# Patient Record
Sex: Male | Born: 1965
Health system: Southern US, Community
[De-identification: ages and names within clinical notes are randomized; demographics above are authoritative.]

## PROBLEM LIST (undated history)

## (undated) DIAGNOSIS — I776 Arteritis, unspecified: Secondary | ICD-10-CM

## (undated) DIAGNOSIS — R569 Unspecified convulsions: Secondary | ICD-10-CM

## (undated) DIAGNOSIS — Z79899 Other long term (current) drug therapy: Secondary | ICD-10-CM

## (undated) DIAGNOSIS — C801 Malignant (primary) neoplasm, unspecified: Secondary | ICD-10-CM

## (undated) DIAGNOSIS — I350 Nonrheumatic aortic (valve) stenosis: Secondary | ICD-10-CM

## (undated) DIAGNOSIS — C4490 Unspecified malignant neoplasm of skin, unspecified: Secondary | ICD-10-CM

## (undated) DIAGNOSIS — E782 Mixed hyperlipidemia: Secondary | ICD-10-CM

## (undated) DIAGNOSIS — I8390 Asymptomatic varicose veins of unspecified lower extremity: Secondary | ICD-10-CM

## (undated) DIAGNOSIS — S0990XA Unspecified injury of head, initial encounter: Secondary | ICD-10-CM

## (undated) DIAGNOSIS — I10 Essential (primary) hypertension: Secondary | ICD-10-CM

## (undated) DIAGNOSIS — R011 Cardiac murmur, unspecified: Secondary | ICD-10-CM

## (undated) HISTORY — DX: Other long term (current) drug therapy: Z79.899

## (undated) HISTORY — DX: Unspecified convulsions: R56.9

## (undated) HISTORY — DX: Unspecified malignant neoplasm of skin, unspecified: C44.90

## (undated) HISTORY — DX: Unspecified injury of head, initial encounter: S09.90XA

## (undated) HISTORY — DX: Nonrheumatic aortic (valve) stenosis: I35.0

## (undated) HISTORY — DX: Mixed hyperlipidemia: E78.2

## (undated) HISTORY — DX: Arteritis, unspecified: I77.6

## (undated) HISTORY — DX: Malignant (primary) neoplasm, unspecified: C80.1

## (undated) HISTORY — PX: OTHER SURGICAL HISTORY: SHX169

## (undated) HISTORY — DX: Asymptomatic varicose veins of unspecified lower extremity: I83.90

## (undated) HISTORY — PX: TOE INTERNAL FIXATION W/ COMPRESSION SCREW: SHX2532

---
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
## 2012-07-31 ENCOUNTER — Telehealth: Payer: Self-pay

## 2012-07-31 NOTE — Telephone Encounter (Signed)
Patient called, left message.  He was requesting a refill on his Depakote through the Patient Assistance Program.  I called them at [phone].  They said his enrollment has expired, and he needs to complete a new application and provide current proof of income.  Once everything is returned to them, they will review app and decide if he qualifies for additional assistance.  I called the patient back, got no answer.  Left message.  I have placed application at the front desk for patient to complete, sign and return along with proof of income.

## 2012-07-31 NOTE — Telephone Encounter (Signed)
Patient returned application, but did not provide proof of income.  I called the patient.  He was not available.  Left message with mom.  She will let him know we called and we need additional info.  Without this info, they will deny the application.

## 2012-11-21 ENCOUNTER — Telehealth: Payer: Self-pay

## 2012-11-21 NOTE — Telephone Encounter (Signed)
I called [facility] at [phone]. Placed Reorder for Depakote. Meds will arrive in 7-10 business days.   

## 2012-11-27 ENCOUNTER — Telehealth: Payer: Self-pay

## 2012-11-27 NOTE — Telephone Encounter (Signed)
Call pt about his patient assist medication ( depakote), spoke with mother, Jamesetta So. Advice her that it would be available for pick up at the front desk. Mother verbalized understanding and will have pt pick it up.

## 2012-12-18 ENCOUNTER — Other Ambulatory Visit: Payer: Self-pay | Admitting: Neurology

## 2013-02-06 ENCOUNTER — Other Ambulatory Visit: Payer: Self-pay | Admitting: Neurology

## 2013-02-26 ENCOUNTER — Telehealth: Payer: Self-pay

## 2013-02-26 NOTE — Telephone Encounter (Signed)
I called [facility] at [phone]. Placed Reorder for Depakote. Meds will arrive in 7-10 business days.

## 2013-03-06 ENCOUNTER — Telehealth: Payer: Self-pay | Admitting: Neurology

## 2013-03-06 NOTE — Telephone Encounter (Signed)
Called patient and spoke with patient's mother Jerry [name] concerning patient's medication of depakote ER 500 mg being ready to be picked up at the front desk and if the patient has any other problems, questions or concerns to call the office. Patient's mother verbalized understanding.

## 2013-06-09 ENCOUNTER — Telehealth: Payer: Self-pay | Admitting: *Deleted

## 2013-06-09 NOTE — Telephone Encounter (Signed)
I called [facility] at [phone]. Placed Reorder for Depakote. Meds will arrive in 7-10 business days.

## 2013-06-09 NOTE — Telephone Encounter (Signed)
Patient calling requesting refill of Depakote 500 mg ER.  Thanks

## 2013-06-12 NOTE — Telephone Encounter (Signed)
Patient was informed that his PA meds came in on today and are at the front desk to be picked up.

## 2013-09-28 ENCOUNTER — Telehealth: Payer: Self-pay | Admitting: *Deleted

## 2013-09-28 NOTE — Telephone Encounter (Signed)
Patient was not available when I called him back previously.  I called again, left message advising he will need to reapply for the program and provide new proof of income.  (Phone number is [phone])

## 2013-09-28 NOTE — Telephone Encounter (Signed)
This patient needs to schedule appt.  I called back, left message.

## 2013-09-28 NOTE — Telephone Encounter (Signed)
Patient has scheduled an appt.  I called [facility] at [phone]. The patient's enrollment has expired, and he must reapply and provide proof of income.  I called the patient back

## 2013-10-05 ENCOUNTER — Telehealth: Payer: Self-pay | Admitting: Nurse Practitioner

## 2013-10-05 NOTE — Telephone Encounter (Signed)
Patient is requesting Depakote samples. He has enough to get him by until Wednesday. Please call to advise.

## 2013-10-05 NOTE — Telephone Encounter (Signed)
I called and spoke with the patient who said he dropped off his PAP paperwork today.  Once provider signs our portion, we will forward this to the program.  Asked me if I could have PAP send him 1 year worth of meds all at once if he is approved.  Advised they have to dispense meds 90 days at a time per program regulations.  He has some medication remaining at this time.    He has an appt scheduled in Nov with CM, but earlier today requested to see Dr Krista Blue this visit per previous portion of this message.

## 2013-10-05 NOTE — Telephone Encounter (Signed)
Patient requesting to be seen sooner than his November scheduled apt for medication management followup. He has not been seen in 2 years. He also requests to see Dr. Krista Blue as he states that he has never seen her and her name is on his medication bottles. Best number to reach is [phone] and it is okay to leave a detailed message at that number.

## 2013-10-05 NOTE — Telephone Encounter (Signed)
Patient last seen by Cm in 2013, and requesting to see Dr. Krista Blue. Please advise and schedule.

## 2013-10-06 ENCOUNTER — Telehealth: Payer: Self-pay | Admitting: Neurology

## 2013-10-06 NOTE — Telephone Encounter (Signed)
Patient is on Dr.Yan schedule  Because she had a cancellation . Patient will see [name] next visit .

## 2013-10-06 NOTE — Telephone Encounter (Signed)
Patient calling again to state that he only has enough Depakote for tomorrow morning, patient is very frustrated, please call and advise.

## 2013-10-06 NOTE — Telephone Encounter (Signed)
I called and spoke with the patient.  He did not submit proof of income with his PAP enrollment form.  Advised him the program will not proceed with his application without this info.  He verbalized understanding and says he will bring it in tomorrow.  Patient does have an appt scheduled in Nov.  He is not able to afford Rx via regular pharmacy while PAP is pending.  Patient is requesting a small supply of samples to last until PAP determination is made.   We will send the proof of income to them once he turns it in.  Patient says he was upset because he thought he could go to the hospital and ask them to give him Depakote at no charge, as he is sure they have plenty.  Explained they would not be able to dispense meds unless he was admitted.

## 2013-10-06 NOTE — Telephone Encounter (Signed)
Ok, medication left at the front desk.

## 2013-10-07 ENCOUNTER — Ambulatory Visit (INDEPENDENT_AMBULATORY_CARE_PROVIDER_SITE_OTHER): Payer: Self-pay

## 2013-10-07 ENCOUNTER — Ambulatory Visit (INDEPENDENT_AMBULATORY_CARE_PROVIDER_SITE_OTHER): Payer: Self-pay | Admitting: Neurology

## 2013-10-07 ENCOUNTER — Encounter: Payer: Self-pay | Admitting: Neurology

## 2013-10-07 ENCOUNTER — Telehealth: Payer: Self-pay

## 2013-10-07 VITALS — BP 127/80 | HR 65 | Ht 71.0 in | Wt 204.0 lb

## 2013-10-07 DIAGNOSIS — S0990XA Unspecified injury of head, initial encounter: Secondary | ICD-10-CM | POA: Insufficient documentation

## 2013-10-07 DIAGNOSIS — Z79899 Other long term (current) drug therapy: Secondary | ICD-10-CM

## 2013-10-07 DIAGNOSIS — Z5181 Encounter for therapeutic drug level monitoring: Secondary | ICD-10-CM | POA: Insufficient documentation

## 2013-10-07 DIAGNOSIS — G40909 Epilepsy, unspecified, not intractable, without status epilepticus: Secondary | ICD-10-CM | POA: Insufficient documentation

## 2013-10-07 DIAGNOSIS — R569 Unspecified convulsions: Secondary | ICD-10-CM

## 2013-10-07 LAB — COMPREHENSIVE METABOLIC PANEL
ALT: 38 IU/L (ref 0–44)
AST: 19 IU/L (ref 0–40)
Albumin/Globulin Ratio: 1.8 (ref 1.1–2.5)
Albumin: 4.6 g/dL (ref 3.5–5.5)
Alkaline Phosphatase: 87 IU/L (ref 39–117)
BUN/Creatinine Ratio: 19 (ref 9–20)
BUN: 18 mg/dL (ref 6–24)
CALCIUM: 9.2 mg/dL (ref 8.7–10.2)
CHLORIDE: 100 mmol/L (ref 96–108)
CO2: 27 mmol/L (ref 18–29)
Creatinine, Ser: 0.95 mg/dL (ref 0.76–1.27)
GFR calc Af Amer: 111 mL/min/{1.73_m2} (ref >59)
GFR calc non Af Amer: 96 mL/min/{1.73_m2} (ref >59)
GLUCOSE: 77 mg/dL (ref 65–99)
Globulin, Total: 2.5 g/dL (ref 1.5–4.5)
Potassium: 4.3 mmol/L (ref 3.5–5.2)
Sodium: 139 mmol/L (ref 134–144)
TOTAL PROTEIN: 7.1 g/dL (ref 6.0–8.5)
Total Bilirubin: 0.2 mg/dL (ref 0.0–1.2)

## 2013-10-07 LAB — CBC
HCT: 45.6 % (ref 37.5–51.0)
HEMOGLOBIN: 16.2 g/dL (ref 12.6–17.7)
MCH: 33.2 pg — ABNORMAL HIGH (ref 26.6–33.0)
MCHC: 35.5 g/dL (ref 31.5–35.7)
MCV: 93 fL (ref 79–97)
PLATELETS: 257 10*3/uL (ref 150–379)
RBC: 4.88 x10E6/uL (ref 4.14–5.80)
RDW: 13.7 % (ref 12.3–15.4)
WBC: 11.6 10*3/uL — AB (ref 3.4–10.8)

## 2013-10-07 LAB — VALPROIC ACID LEVEL: Valproic Acid Lvl: 44 ug/mL — ABNORMAL LOW (ref 50–100)

## 2013-10-07 LAB — PHENYTOIN LEVEL, TOTAL: Phenytoin Lvl: 19 ug/mL (ref 10.0–20.0)

## 2013-10-07 MED ORDER — DIVALPROEX SODIUM ER 500 MG PO TB24: ORAL_TABLET | ORAL | Status: DC

## 2013-10-07 MED ORDER — PHENYTOIN SODIUM EXTENDED 100 MG PO CAPS: ORAL_CAPSULE | ORAL | Status: DC

## 2013-10-07 NOTE — Progress Notes (Signed)
PATIENT: Jerry [name] DOB: 25-May-1965  HISTORICAL  Jerry [name] is a 48 year old right-handed Caucasian male, alone at visit, to followup his seizure disorder  Last clinical visit was our office was in November 2013 with Jerry [name], he had a history of seizure disorder since subdural hematoma after a traumatic brain injury in 1984, previous abnormal EEG, he also had some anxiety disorder, he is no longer working.  He lives with his mother, driving, last clinical seizure was September 2011,  He has been on long-term combination therapy of Depakote ER 500 mg 2 tablets twice a day, high dose of Dilantin 100 mg 3 tablets twice a day, plus chewable 50 mg once a day  He is getting assistance program for Depakote ER, coordinated through our office  He is overall doing well, tolerating medications, no recurrent seizure  REVIEW OF SYSTEMS: Full 14 system review of systems performed and notable only for as above  ALLERGIES: Allergies not on file  HOME MEDICATIONS: Current Outpatient Prescriptions on File Prior to Visit  Medication Sig Dispense Refill  . phenytoin (DILANTIN) 100 MG ER capsule TAKE 3 CAPSULE TWICE DAILY  180 capsule  0  . phenytoin (DILANTIN) 50 MG tablet TAKE 1 TABLET BY MOUTH EVERY DAY WITH 100MG  TABS  30 tablet  11   No current facility-administered medications on file prior to visit.    PAST MEDICAL HISTORY: Past Medical History  Diagnosis Date  . Seizure   . Head injury   . Encounter for long-term (current) use of other medications     PAST SURGICAL HISTORY: Past Surgical History  Procedure Laterality Date  . Toe internal fixation w/ compression screw      FAMILY HISTORY: Family History  Problem Relation Age of Onset  . Diabetes Father     SOCIAL HISTORY:  History   Social History  . Marital Status: Married    Spouse Name: N/A    Number of Children: 0  . Years of Education: 12   Occupational History  .      Not working    Social History Main Topics  . Smoking status: Current Every Day Smoker  . Smokeless tobacco: Never Used  . Alcohol Use: No  . Drug Use: No  . Sexual Activity: Not on file   Other Topics Concern  . Not on file   Social History Narrative   Patient is currently not [place] school education some college. Patient  Is single and lives with his mother.    Right handed.   Caffeine one cup daily.     PHYSICAL EXAM   Filed Vitals:   10/07/13 0831  BP: 127/80  Pulse: 65  Height: 5\' 11"  (1.803 m)  Weight: 204 lb (92.534 kg)    Not recorded    Body mass index is 28.46 kg/(m^2).   Generalized: In no acute distress  Neck: Supple, no carotid bruits   Cardiac: Regular rate rhythm  Pulmonary: Clear to auscultation bilaterally  Musculoskeletal: No deformity  Neurological examination  Mentation: Alert oriented to time, place, history taking, and causual conversation  Cranial nerve II-XII: Pupils were equal round reactive to light. Extraocular movements were full.  Visual field were full on confrontational test. Bilateral fundi were sharp.  Facial sensation and strength were normal. Hearing was intact to finger rubbing bilaterally. Uvula tongue midline.  Head turning and shoulder shrug and were normal and symmetric.Tongue protrusion into cheek strength was normal.  Motor: Normal tone, bulk and strength.  Sensory: Intact to fine touch, pinprick, preserved vibratory sensation, and proprioception at toes.  Coordination: Normal finger to nose, heel-to-shin bilaterally there was no truncal ataxia  Gait: Rising up from seated position without assistance, normal stance, without trunk ataxia, moderate stride, good arm swing, smooth turning, able to perform tiptoe, and heel walking without difficulty.   Romberg signs: Negative  Deep tendon reflexes: Brachioradialis 2/2, biceps 2/2, triceps 2/2, patellar 2/2, Achilles 2/2, plantar responses were flexor bilaterally.   DIAGNOSTIC  DATA (LABS, IMAGING, TESTING) - I reviewed patient records, labs, notes, testing and imaging myself where available.   ASSESSMENT AND PLAN  Jerry [name] is a 48 y.o. male with previous history of subdural hematoma, bilateral frontal craniotomy, complex partial seizure, abnormal EEG in the past, last seizure was in September 2011, he has been doing very well, driving, he is on large combination dose of Dilantin, 650 mg a day, Depakote ER 500 mg 2 tablets twice a day   1, he has been doing very well, we will repeat EEG, laboratory evaluations 2. We will increase his Depakote ER dose to 500 mg 2 tablets in the morning, 3 tablets at evening 3. Gradually wean himself to lower dose of Dilantin, 100 mg 2 tablets every night 4.  return to clinic in 3 months with [name], if he continued to do well, depending on the Depakote level, may increase his Depakote further to 500 mg 3 tablets twice a day, weaning off Dilantin completely 5. He is to call office for any recurrent seizure, or seizure-like event  Marcial Pacas, M.D. Ph.D.  [facility] [address] [place], [place] [address] [phone]

## 2013-10-07 NOTE — Patient Instructions (Addendum)
You should take lower dose of dilantin,  1. Stop chewable dialntin 50mg  2. 1st week, dilantin 100mg  2 tabs in the am/3 tabs pm  2nd week, 1 tabs in am/3 tabs pm  3rd week,  0/3 tabs  4th week , 0/2 tabs.

## 2013-10-07 NOTE — Telephone Encounter (Signed)
The patient brought in proof of income.  I called PAP at [phone], spoke with Tye.  Advised we would be faxing this info and asked if they could expedite the request.  He has noted patient's file and they will try to get the process completed ASAP.  All requested info has been faxed to [phone].  They will notify the patient of an outcome once a determination has been made.

## 2013-10-08 ENCOUNTER — Telehealth: Payer: Self-pay | Admitting: Neurology

## 2013-10-08 MED ORDER — DIVALPROEX SODIUM ER 500 MG PO TB24: 1500.0000 mg | ORAL_TABLET | Freq: Two times a day (BID) | ORAL | Status: DC

## 2013-10-08 NOTE — Procedures (Signed)
   HISTORY: 48 year old male, with previous history of traumatic brain injury, subdural  hematoma, complex partial seizure.  TECHNIQUE:  16 channel EEG was performed based on standard 10-16 international system. One channel was dedicated to EKG, which has demonstrates normal sinus rhythm of 72 beats per minutes.  Upon awakening, the posterior background activity was well-developed, in alpha range, 9 Hz, reactive to eye opening and closure. There was dysarrhythmic, theta range activity at bilateral frontal region, also obscured by frequent muscle artifact. There was no evidence of epileptiform discharge.  Photic stimulation was performed, which induced a symmetric photic driving.  Hyperventilation was not performed, there was no abnormality elicit.  Patient was drowsy during recording, but no deeper stage of sleep was achieved.  CONCLUSION: This is a  mild abnormal EEG due to intermittent bilateral frontal slowing,.  There is no electrodiagnostic evidence of epileptiform discharge

## 2013-10-08 NOTE — Telephone Encounter (Signed)
I have called him, EEG showed mild slowing at bifrontal regions.  VPA level was 44, increase Depakote ER 500mg  iii bid. Wean off dilantin to lower dose 100mg  ii qhs.  Keep followup visit.

## 2013-10-14 ENCOUNTER — Telehealth: Payer: Self-pay

## 2013-10-14 ENCOUNTER — Telehealth: Payer: Self-pay | Admitting: Neurology

## 2013-10-14 NOTE — Telephone Encounter (Signed)
Called pt to inform him that his medication Depakote ER 500 mg was ready to be picked up at the front desk and if he has any other problems, questions or concerns to call the office. Pt verbalized understanding.

## 2013-10-14 NOTE — Telephone Encounter (Signed)
[employer] notified us they have approved the application for Patient Assistance on Depakote effective until 10/08/2014.  They state the patient has been advised of this decision as well.

## 2013-11-17 ENCOUNTER — Telehealth: Payer: Self-pay

## 2013-11-17 NOTE — Telephone Encounter (Signed)
I spoke with Langley Gauss from [facility].  She said they are sending an additional bottle of Depakote for the patient.  States the last order they shipped was missing a bottle because they were filling from an old Rx, instead of current dose.  Meds are scheduled to arrive in 7-10 business days.

## 2013-11-23 NOTE — Telephone Encounter (Signed)
I received Patient assistance bottle for Depakote ER 500mg  #100. I called the patient and spoke with his wife.  Ready for pick up at the front desk.

## 2013-12-30 ENCOUNTER — Ambulatory Visit: Payer: Self-pay | Admitting: Nurse Practitioner

## 2014-01-18 ENCOUNTER — Telehealth: Payer: Self-pay | Admitting: Nurse Practitioner

## 2014-01-18 NOTE — Telephone Encounter (Signed)
I called PAP at [phone].  Spoke with [employer].  Order has been placed.  Meds will arrive in 7-10 business days.  Ref # [id number].   I called the patient back.  Left message advising we will contact him when meds are ready for pick up.

## 2014-01-18 NOTE — Telephone Encounter (Signed)
Patient requesting Rx refill pick up of divalproex (DEPAKOTE ER) 500 MG 24 hr tablet.  Patient on assistance program and get medication from our office.  Please call and advise.

## 2014-01-20 ENCOUNTER — Encounter: Payer: Self-pay | Admitting: Nurse Practitioner

## 2014-01-20 ENCOUNTER — Ambulatory Visit (INDEPENDENT_AMBULATORY_CARE_PROVIDER_SITE_OTHER): Payer: Self-pay | Admitting: Nurse Practitioner

## 2014-01-20 VITALS — BP 128/78 | HR 63 | Temp 97.9°F | Resp 18 | Ht 71.0 in | Wt 220.4 lb

## 2014-01-20 DIAGNOSIS — R569 Unspecified convulsions: Secondary | ICD-10-CM

## 2014-01-20 DIAGNOSIS — Z5181 Encounter for therapeutic drug level monitoring: Secondary | ICD-10-CM

## 2014-01-20 MED ORDER — DIVALPROEX SODIUM ER 500 MG PO TB24: ORAL_TABLET | ORAL | Status: DC

## 2014-01-20 NOTE — Patient Instructions (Signed)
Will check labs today No depakote samples call the office by Friday morning we close at 12 noon. Follow-up yearly and when necessary

## 2014-01-20 NOTE — Progress Notes (Signed)
[facility]  PATIENT: Jerry [name] DOB: 31-Dec-1965   REASON FOR VISIT: Follow-up for seizure disorder  HISTORY OF PRESENT ILLNESS: Jerry [name] is a 48 year old right-handed Caucasian male, alone at visit, to followup his seizure disorder Last clinical visit was our office was in November 2013 with Jerry [name], he had a history of seizure disorder since subdural hematoma after a traumatic brain injury in 1984, previous abnormal EEG, he also had some anxiety disorder, he is no longer working. He lives with his mother, driving, last clinical seizure was September 2011, He has been on long-term combination therapy of Depakote ER 500 mg 2 tablets twice a day, high dose of Dilantin 100 mg 3 tablets twice a day, plus chewable 50 mg once a day He is getting assistance program for Depakote ER, coordinated through our office He is overall doing well, tolerating medications, no recurrent seizure  UPDATE 01/20/14: Jerry [name], 48 year old male returns for follow-up. He was last seen in the office 9/2/ 2015 by Dr. Krista Blue his Depakote was increased to 3 tabs twice a day at that time Depakote level was 44. He was tapered from 650 mg a day of Dilantin down to 200 mg. Last seizure activity was in 2011. He gets his medication for patient assistance and once again was late calling us to renew. He will run out of his Depakote on Saturday and we do not have samples. He returns for reevaluation and labs   REVIEW OF SYSTEMS: Full 14 system review of systems performed and notable only for those listed, all others are neg:  Constitutional: N/A  Cardiovascular: N/A  Ear/Nose/Throat: N/A  Skin: N/A  Eyes: N/A  Respiratory: N/A  Gastroitestinal: N/A  Hematology/Lymphatic: N/A  Endocrine: N/A Musculoskeletal:N/A  Allergy/Immunology: N/A  Neurological: N/A Psychiatric: N/A Sleep : NA   ALLERGIES: No Known Allergies  HOME MEDICATIONS: Outpatient Prescriptions Prior to Visit    Medication Sig Dispense Refill  . divalproex (DEPAKOTE ER) 500 MG 24 hr tablet Take 3 tablets (1,500 mg total) by mouth 2 (two) times daily. 2 tabs po qam, 3 tabs po qhs 180 tablet 11  . phenytoin (DILANTIN) 100 MG ER capsule Gradually tapering down to 2 tabs po qhs 180 capsule 6   No facility-administered medications prior to visit.    PAST MEDICAL HISTORY: Past Medical History  Diagnosis Date  . Seizure   . Head injury   . Encounter for long-term (current) use of other medications     PAST SURGICAL HISTORY: Past Surgical History  Procedure Laterality Date  . Toe internal fixation w/ compression screw      FAMILY HISTORY: Family History  Problem Relation Age of Onset  . Diabetes Father     SOCIAL HISTORY: History   Social History  . Marital Status: Married    Spouse Name: N/A    Number of Children: 0  . Years of Education: 12   Occupational History  .      Not working   Social History Main Topics  . Smoking status: Current Every Day Smoker -- 0.50 packs/day  . Smokeless tobacco: Never Used  . Alcohol Use: No  . Drug Use: No  . Sexual Activity: Not on file   Other Topics Concern  . Not on file   Social History Narrative   Patient is currently not [place] school education some college. Patient  Is single and lives with his mother.    Right handed.   Caffeine one cup daily.  PHYSICAL EXAM  Filed Vitals:   01/20/14 1419  BP: 128/78  Pulse: 63  Temp: 97.9 F (36.6 C)  TempSrc: Oral  Resp: 18  Height: 5\' 11"  (1.803 m)  Weight: 220 lb 6.4 oz (99.973 kg)   Body mass index is 30.75 kg/(m^2). Generalized: In no acute distress Neck: Supple, no carotid bruits  Musculoskeletal: No deformity  Neurological examination Mentation: Alert oriented to time, place, history taking, and causual conversation Cranial nerve II-XII: Pupils were equal round reactive to light. Extraocular movements were full. Visual field were full on confrontational test.  Bilateral fundi were sharp. Facial sensation and strength were normal. Hearing was intact to finger rubbing bilaterally. Uvula tongue midline. Head turning and shoulder shrug and were normal and symmetric.Tongue protrusion into cheek strength was normal. Motor: Normal tone, bulk and strength. Sensory: Intact to fine touch, pinprick, preserved vibratory sensation, and proprioception at toes. Coordination: Normal finger to nose, heel-to-shin bilaterally there was no truncal ataxia Gait: Rising up from seated position without assistance, normal stance, without trunk ataxia, moderate stride, good arm swing, smooth turning, able to perform tiptoe, and heel walking without difficulty.  Romberg signs: Negative Deep tendon reflexes: Brachioradialis 2/2, biceps 2/2, triceps 2/2, patellar 2/2, Achilles 2/2, plantar responses were flexor bilaterally.   DIAGNOSTIC DATA (LABS, IMAGING, TESTING) - I reviewed patient records, labs, notes, testing and imaging myself where available.  Lab Results  Component Value Date   WBC 11.6* 10/07/2013   HGB 16.2 10/07/2013   HCT 45.6 10/07/2013   MCV 93 10/07/2013   PLT 257 10/07/2013      Component Value Date/Time   NA 139 10/07/2013 0903   K 4.3 10/07/2013 0903   CL 100 10/07/2013 0903   CO2 27 10/07/2013 0903   GLUCOSE 77 10/07/2013 0903   BUN 18 10/07/2013 0903   CREATININE 0.95 10/07/2013 0903   CALCIUM 9.2 10/07/2013 0903   PROT 7.1 10/07/2013 0903   AST 19 10/07/2013 0903   ALT 38 10/07/2013 0903   ALKPHOS 87 10/07/2013 0903   BILITOT 0.2 10/07/2013 0903   GFRNONAA 96 10/07/2013 0903   GFRAA 111 10/07/2013 0903    ASSESSMENT AND PLAN  48 y.o. year old male  has a past medical history of Seizure; Head injury; and Encounter for long-term (current) use of other medications. here to follow-up. Medication change at his last visit. We will get labs today. Patient was late in renewing his patient assistance and we do not have samples of  Depakote.  Will check labs today No depakote samples call the office by Friday morning we close at 12 noon. Follow-up yearly and when necessary Jerry [name], [facility], [facility], APRN  [facility] [address], [place], [place] [address] [phone]

## 2014-01-21 LAB — VALPROIC ACID LEVEL: Valproic Acid Lvl: 85 ug/mL (ref 50–100)

## 2014-01-21 NOTE — Telephone Encounter (Signed)
I called the patient and notified him that his PAP of Depakote is ready for pickup at the front desk and I also notified him of his lab-work.

## 2014-01-21 NOTE — Telephone Encounter (Addendum)
I received the patients samples and I have placed them at the front desk for pick up.  I tried to call the patient but his phone continues to be busy.  I also need to inform the patient that his Valproic acid level is normal. See Result Note.

## 2014-02-12 NOTE — Progress Notes (Signed)
I agree above plan. 

## 2014-04-15 ENCOUNTER — Telehealth: Payer: Self-pay | Admitting: Nurse Practitioner

## 2014-04-15 NOTE — Telephone Encounter (Signed)
I called PAP at [phone].  Order has been placed.  Meds will arrive in 7-10 business days.

## 2014-04-15 NOTE — Telephone Encounter (Signed)
Patient requesting refill for Rx divalproex (DEPAKOTE ER) 500 MG 24 hr tablet.  Please call when ready for pick up.

## 2014-04-21 ENCOUNTER — Telehealth: Payer: Self-pay

## 2014-04-21 NOTE — Telephone Encounter (Signed)
Called patient to inform med available for pick up at front desk. Mother Silva Bandy) stated he was not in but she would give him the message.

## 2014-07-26 ENCOUNTER — Telehealth: Payer: Self-pay | Admitting: Neurology

## 2014-07-26 NOTE — Telephone Encounter (Signed)
Patient called requesting refill for divalproex (DEPAKOTE ER) 500 MG 24 hr tablet . Patient advised RX will be ready within 24 hours unless otherwise informed by RN. Patient can reached at [phone].

## 2014-07-26 NOTE — Telephone Encounter (Signed)
Patient gets this Rx through [facility].  (It is not a narcotic, and therefore the 24 hour pick up is not applicable).  I called PAP at [phone].  Refill has been ordered.  Meds will arrive in 7-10 business days.  I called the patient back.  Got no answer.  Left message relaying meds have been ordered, and will arrive in 7-10 business days.

## 2014-08-03 NOTE — Telephone Encounter (Signed)
Called patient to inform meds ready for pick up at front desk. Left message.

## 2014-11-06 DIAGNOSIS — I776 Arteritis, unspecified: Secondary | ICD-10-CM

## 2014-11-06 HISTORY — DX: Arteritis, unspecified: I77.6

## 2014-11-08 ENCOUNTER — Telehealth: Payer: Self-pay | Admitting: Neurology

## 2014-11-08 NOTE — Telephone Encounter (Signed)
Patient is calling and states he is on the drug assistance program and would like to order more divalproex 500 mg 24 hr.  Please call!

## 2014-11-08 NOTE — Telephone Encounter (Signed)
I contacted PAP at [phone].  Ref Patient ID # [id number].  The patient's enrollment has expired, and it is necessary for Mr Rogus to reapply.  I called the patient back to advise.  Got no answer.  Left message.

## 2014-11-09 ENCOUNTER — Telehealth: Payer: Self-pay | Admitting: Nurse Practitioner

## 2014-11-09 NOTE — Telephone Encounter (Signed)
Pt's mother called and is asking for a medication assistance form. Please mail to pt. Thank you [phone]

## 2014-11-09 NOTE — Telephone Encounter (Signed)
Blank enrollment application has been sent to the patient to complete and return with proof of income.

## 2014-11-09 NOTE — Telephone Encounter (Signed)
Pt called to confirm form was to be mailed. I relayed message and pt understood and was satisfied.

## 2014-11-09 NOTE — Telephone Encounter (Signed)
Blank enrollment application has been sent to the patient.

## 2014-11-15 NOTE — Telephone Encounter (Signed)
I called back and spoke with the patient.  He is aware form was mailed, and says he may be able to stop by today and complete one at the office.  I explained proof of income would be required in order for them to process enrollment.  He expressed understanding.  Application has been faxed to clinic, will be placed at the front desk in the event patient does come in to complete it.

## 2014-11-15 NOTE — Telephone Encounter (Addendum)
Pt called stating he has not received form yet and will be out of medication by Sunday. Please call and advise him on form and what he needs to do to get medication to hold him until authorization from assistance program is given. He can be reached at [phone]

## 2014-11-16 ENCOUNTER — Other Ambulatory Visit: Payer: Self-pay

## 2014-11-16 MED ORDER — DIVALPROEX SODIUM ER 500 MG PO TB24: ORAL_TABLET | ORAL | Status: DC

## 2014-11-16 NOTE — Telephone Encounter (Signed)
I called back and spoke with the patient.  Although we placed an enrollment form at the front desk yesterday (see next note) he is unable to come to the office at this time.  We will mail another blank enrollment form to him.  I verified his address is correct.  As well, he asked that we send a small Rx to local pharmacy to bridge him until he is able to complete the enrollment process.  I have sent the Rx, and as well, faxed a discount voucher to the pharmacy to aide with cost.  Discount Card Info: RxBIN: [id number] RxPCN: [id number] RxGrp: [id number] RxID: [id number] Suf: 01

## 2014-11-16 NOTE — Telephone Encounter (Signed)
Patient called back to request refill on Depakote. Still hasn't received form for drug assistance program. Does not have time to come by to pick up form due to just starting a new job. Will be out of medication for the weekend, last dose will be Friday night.

## 2014-11-18 ENCOUNTER — Telehealth: Payer: Self-pay

## 2014-11-18 NOTE — Telephone Encounter (Signed)
[place] with me but why not stay on generic

## 2014-11-18 NOTE — Telephone Encounter (Signed)
I called the patient back.  Relayed providers message.  He expressed understanding.  Says if he does well on generic, may discuss staying on it at next OV.  He will get Rx from pharmacy and call us back if anything further is needed.

## 2014-11-18 NOTE — Telephone Encounter (Signed)
Patient ask that you leave a detailed message on home answering machine if he doesn't answer or call cell phone.

## 2014-11-18 NOTE — Telephone Encounter (Signed)
Message has been sent to provider for review.   (Encounter was already closed when forwarded)

## 2014-11-18 NOTE — Telephone Encounter (Signed)
Patient called to ask "if he could take generic of Depakote $96 vs brand $300 in the meantime until patient assistance kicks in".

## 2014-11-18 NOTE — Telephone Encounter (Signed)
Patient is in the process of reapplying for patient assist on Depakote.  He would like to know if it would be okay to try generic Depakote from local pharmacy while enrollment is pending for cost reasons.  States he is unable to come to our office at this time to pick up samples or enrollment form due to current work schedule.  (We have mailed him the blank enrollment application to complete and return with proof of income.)

## 2014-12-01 NOTE — Telephone Encounter (Signed)
We do not have his enrollment application.  I called the patient on cell, got no answer.  VM has not been set up, so was not able to leave message.  Called home.  Spoke with Ms Avino.  She said they have the application he completed.  I advised we will need this at the office to forward to the program.  She expressed understanding and will ask the patient to bring it to [place].

## 2014-12-01 NOTE — Telephone Encounter (Signed)
Patient called to advise he stopped by our office 35/45/62 to fill out application for patient assist (depakote) and form was mailed back to him and was not signed by Doctor. Patient is requesting samples if we have them.

## 2014-12-02 ENCOUNTER — Other Ambulatory Visit: Payer: Self-pay | Admitting: Neurology

## 2014-12-02 MED ORDER — DIVALPROEX SODIUM ER 500 MG PO TB24: ORAL_TABLET | ORAL | Status: DC

## 2014-12-02 NOTE — Telephone Encounter (Signed)
I called back and spoke with the patient.  He will return application and proof of income when he picks up samples.

## 2014-12-02 NOTE — Telephone Encounter (Signed)
Last sample that we have of depakote ER 500mg  tablets placd up front for pt LOT [id number], exp 09-10-1016  (12 tablets only).

## 2014-12-02 NOTE — Addendum Note (Signed)
Addended byOliver Hum on: 12/02/2014 09:52 AM   Modules accepted: Orders

## 2014-12-02 NOTE — Telephone Encounter (Signed)
Pt called and stated that last week he brought application to office filled out. He then got the application back thru the mail. He does not understand why or how to proceed. He was also notified of Depakote samples at front desk. He expressed understanding.

## 2014-12-06 ENCOUNTER — Telehealth: Payer: Self-pay | Admitting: *Deleted

## 2014-12-06 NOTE — Telephone Encounter (Signed)
I spoke to mother and she will relay that checking with pharmacist would be best since they specialize in drugs.  She would relay to pt.

## 2014-12-14 ENCOUNTER — Telehealth: Payer: Self-pay | Admitting: Neurology

## 2014-12-14 ENCOUNTER — Other Ambulatory Visit: Payer: Self-pay

## 2014-12-14 MED ORDER — DIVALPROEX SODIUM ER 500 MG PO TB24: ORAL_TABLET | ORAL | Status: DC

## 2014-12-14 NOTE — Telephone Encounter (Signed)
I contacted PAP.  They indicated they do have everything they need from [place], and the enrollment is pending.  Indicated the patient did not answer one of his questions on the form, so that delayed the process.  They will notify us and the patient of outcome once decision is made.  I called the patient back on cell, got no answer, VM has not been set up, so was not able to leave message.  Called home, got no answer.  Left message advising enrollment is still pending.  Will send a small Rx to local pharmacy in the event patient needs additional medication while this is pending, as Depakote is no longer sampled.

## 2014-12-14 NOTE — Telephone Encounter (Signed)
Patient called back in regard to his application for financial help for Depakote ER 500.  He was wanting to pick up his Rx up front.  Please call.

## 2014-12-17 ENCOUNTER — Telehealth: Payer: Self-pay

## 2014-12-17 NOTE — Telephone Encounter (Signed)
Jerry [name] has approved patient assistance on Depakote ER for the patient effective until 12/14/2015.   They state they have notified the patient of this decision as well.

## 2014-12-20 NOTE — Telephone Encounter (Signed)
I called pt and let him know that his PAP Depakote ER  500mg  tabs is here  (6 boxes of 100 tabs/each)  LOT  [id number], exp 08/13/2015.  Placed up front in box.  He verbalized understanding.   Will pick up this week sometime.

## 2014-12-20 NOTE — Telephone Encounter (Signed)
Patient called to inquire if Depakote has come in from the patient assistance program.

## 2015-01-20 ENCOUNTER — Ambulatory Visit: Payer: Self-pay | Admitting: Nurse Practitioner

## 2015-01-21 ENCOUNTER — Encounter: Payer: Self-pay | Admitting: Family Medicine

## 2015-01-21 ENCOUNTER — Telehealth: Payer: Self-pay | Admitting: Family Medicine

## 2015-01-21 ENCOUNTER — Ambulatory Visit (INDEPENDENT_AMBULATORY_CARE_PROVIDER_SITE_OTHER): Payer: Self-pay | Admitting: Family Medicine

## 2015-01-21 VITALS — BP 148/90 | HR 66 | Temp 96.8°F | Ht 71.0 in | Wt 234.8 lb

## 2015-01-21 DIAGNOSIS — B356 Tinea cruris: Secondary | ICD-10-CM

## 2015-01-21 DIAGNOSIS — I878 Other specified disorders of veins: Secondary | ICD-10-CM | POA: Insufficient documentation

## 2015-01-21 MED ORDER — TRIAMCINOLONE ACETONIDE 0.1 % EX OINT: 1.0000 "application " | TOPICAL_OINTMENT | Freq: Two times a day (BID) | CUTANEOUS | Status: DC

## 2015-01-21 MED ORDER — TERBINAFINE HCL 250 MG PO TABS: 250.0000 mg | ORAL_TABLET | Freq: Every day | ORAL | Status: DC

## 2015-01-21 NOTE — Patient Instructions (Signed)
Great to meet you!  Try the terbinafene for your rash Start a drying powder as well  Use Triamcinolone for the itchy rash that develops in your legs sometimes, do not use this for more than 10 days at a time.  Come back in February.   Jock Itch Jock itch is an infection of the skin in the groin area. It is caused by a type of germ (fungus). The infection causes a rash and itching in the groin and upper thigh. It is common in people who play sports. Being in places with hot weather and wearing tight or wet clothes can increase the chance of getting jock itch. The rash usually goes away in 2-3 weeks with treatment. HOME CARE  Take medicines only as told by your doctor. Apply skin creams or ointments exactly as told.  Wear loose-fitting clothing.  Men should wear cotton boxer shorts.  Women should wear cotton underwear.  Change your underwear every day to keep your groin dry.  Avoid hot baths.  Dry your groin area well after you take a bath or shower.  Use a separate towel to dry your groin area. This will help to prevent a spreading of the infection to other areas of your body.  Do not scratch the affected area.  Do not share towels with other people. GET HELP IF:  Your rash does not improve or it gets worse after 2 weeks of treatment.  Your rash is spreading.  Your rash comes back after treatment is finished.  You have a fever.  You have redness, swelling, or pain in the area around your rash.  You have fluid, blood, or pus coming from your rash.  Your have your rash for more than 4 weeks.   This information is not intended to replace advice given to you by your health care provider. Make sure you discuss any questions you have with your health care provider.   Document Released: 04/18/2009 Document Revised: 02/12/2014 Document Reviewed: 11/03/2013 Elsevier Interactive Patient Education [employer].

## 2015-01-21 NOTE — Progress Notes (Addendum)
   HPI  Patient presents today ablet care and discuss her rash as well as leg swelling.  He has a seizure disorder well controlled for several years without seizures on Depakote and phenytoin.   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 He's developed leg swelling at the end of his shift along with leg pain. He has occasional erythematous itchy rash at his ankles He's been treated twice by urgent care with oral prednisone for this with resolution of hisrash. He has triedcopperstockingswith not much improvement.  Rash He describes a groin rash with tenderness while he watchesand pain while he walks. This has started over the last 3-4 weeks. No drainage. He's used Silvadene with no improvement   PMH: Smoking status noted Asked medical, surgical, social, family historyreviewed and updated in EMR ROS: Per HPI  Objective: BP 148/90 mmHg  Pulse 66  Temp(Src) 96.8 F (36 C) (Oral)  Ht 5\' 11"  (1.803 m)  Wt 234 lb 12.8 oz (106.505 kg)  BMI 32.76 kg/m2 Gen: NAD, alert, cooperative with exam HEENT: NCAT, PERRLA, TMs urine by cerumen bilaterally, nares clear, oropharynx clear CV: RRR, good S1/S2, no murmur Resp: CTABL, no wheezes, non-labored Abd: SNTND, BS present, no guarding or organomegaly Ext: 2 + pitting edema bilateral lower extremities to the midcalf, no rash Neuro: Alert and oriented, 2+ patellar tendon revflex BL, normal gait  Assessment and plan:  # Venous stasis Recommended medical compression stocking, Rx given for 20-30 mmHg, thigh high No labs today as he is self pay Kenalog ointment for stasis dermatitis when it flares  # Rash, Tinea cruris vs intertrigo C/w tinea/yeast Treate with terbinafene considering cost cons=cern + drying powder lamisil 250 mg  Qd X 14 days.  No labs with cost concerns    Meds ordered this encounter  Medications  . terbinafine (LAMISIL) 250 MG tablet    Sig: Take 1 tablet (250 mg  total) by mouth daily.    Dispense:  14 tablet    Refill:  0  . DISCONTD: triamcinolone ointment (KENALOG) 0.1 %    Sig: Apply 1 application topically 2 (two) times daily. To lower legs, no more than 10 straight days    Dispense:  30 g    Refill:  1  . triamcinolone ointment (KENALOG) 0.1 %    Sig: Apply 1 application topically 2 (two) times daily. To lower legs, no more than 10 straight days    Dispense:  30 g    Refill:  [place], MD [facility] 01/21/2015, 11:11 AM

## 2015-01-21 NOTE — Telephone Encounter (Signed)
No answer  Laroy Apple, MD [facility] 01/21/2015, 12:11 PM

## 2015-02-04 ENCOUNTER — Telehealth: Payer: Self-pay | Admitting: *Deleted

## 2015-02-04 NOTE — Telephone Encounter (Signed)
Patient does not have a documented flu shot or declination.  Left message to call back with this information.

## 2015-02-06 ENCOUNTER — Other Ambulatory Visit: Payer: Self-pay | Admitting: Nurse Practitioner

## 2015-02-10 ENCOUNTER — Telehealth: Payer: Self-pay | Admitting: Neurology

## 2015-02-10 NOTE — Telephone Encounter (Signed)
Pt called and needs refill on divalproex (DEPAKOTE ER) 500 MG 24 hr tablet. He states he is with the pt assistance program. May call pt [phone]

## 2015-02-10 NOTE — Telephone Encounter (Signed)
We contacted PAP at [phone]. Ref Patient ID # [id number].  Spoke with Rosamaria Lints.  Order has been placed and will arrive in 7-10 business days.

## 2015-02-22 NOTE — Telephone Encounter (Signed)
Called Patient and made him aware his patient assistance was ready to Pick up Depakote ER. Relayed office hours.

## 2015-02-24 ENCOUNTER — Ambulatory Visit: Payer: Self-pay | Admitting: Nurse Practitioner

## 2015-02-28 ENCOUNTER — Ambulatory Visit (INDEPENDENT_AMBULATORY_CARE_PROVIDER_SITE_OTHER): Payer: BLUE CROSS/BLUE SHIELD | Admitting: Nurse Practitioner

## 2015-02-28 ENCOUNTER — Encounter (INDEPENDENT_AMBULATORY_CARE_PROVIDER_SITE_OTHER): Payer: Self-pay

## 2015-02-28 ENCOUNTER — Encounter: Payer: Self-pay | Admitting: Nurse Practitioner

## 2015-02-28 VITALS — BP 151/87 | HR 74 | Ht 71.0 in | Wt 231.4 lb

## 2015-02-28 DIAGNOSIS — R569 Unspecified convulsions: Secondary | ICD-10-CM | POA: Diagnosis not present

## 2015-02-28 DIAGNOSIS — Z5181 Encounter for therapeutic drug level monitoring: Secondary | ICD-10-CM | POA: Diagnosis not present

## 2015-02-28 DIAGNOSIS — S0990XD Unspecified injury of head, subsequent encounter: Secondary | ICD-10-CM | POA: Diagnosis not present

## 2015-02-28 MED ORDER — PHENYTOIN SODIUM EXTENDED 100 MG PO CAPS: 200.0000 mg | ORAL_CAPSULE | Freq: Every day | ORAL | Status: DC

## 2015-02-28 NOTE — Patient Instructions (Signed)
Continue Depakote at current dose patient assistance Continue Dilantin will refill Will check labs today

## 2015-02-28 NOTE — Progress Notes (Signed)
I have reviewed and agreed above plan. 

## 2015-02-28 NOTE — Progress Notes (Signed)
[facility]  PATIENT: Jerry [name] DOB: 10-09-1965   REASON FOR VISIT: Follow-up for seizure disorder HISTORY FROM: Patient    HISTORY OF PRESENT ILLNESS: HISTORY: Jerry [name] is a 50 year old right-handed Caucasian male, alone at visit, to followup his seizure disorder Last clinical visit was our office was in November 2013 with [name], he had a history of seizure disorder since subdural hematoma after a traumatic brain injury in 1984, previous abnormal EEG, he also had some anxiety disorder, he is no longer working. He lives with his mother, driving, last clinical seizure was September 2011, He has been on long-term combination therapy of Depakote ER 500 mg 2 tablets twice a day, high dose of Dilantin 100 mg 3 tablets twice a day, plus chewable 50 mg once a day He is getting assistance program for Depakote ER, coordinated through our office He is overall doing well, tolerating medications, no recurrent seizure  UPDATE 01/20/14: Mr. Jerry [name], 50 year old male returns for follow-up. He was last seen in the office 9/2/ 2015 by Dr. Krista Blue his Depakote was increased to 3 tabs twice a day at that time Depakote level was 44. He was tapered from 650 mg a day of Dilantin down to 200 mg. Last seizure activity was in 2011. He gets his medication for patient assistance and once again was late calling us to renew. He will run out of his Depakote on Saturday and we do not have samples. He returns for reevaluation and labs UPDATE 02/28/2015 Mr. Jerry [name] 50 year old male returns for follow-up. He has a history of seizure disorder and is currently on Depakote from  patient assistance and Dilantin 200 mg at night. Last seizure activity was 2011. He has recently started a new job and now has insurance that just came effective. He returns for reevaluation and he needs labs.  REVIEW OF SYSTEMS: Full 14 system review of systems performed and notable only for those listed, all others  are neg:  Constitutional: neg  Cardiovascular: neg Ear/Nose/Throat: neg  Skin: neg Eyes: neg Respiratory: neg Gastroitestinal: neg  Hematology/Lymphatic: neg  Endocrine: neg Musculoskeletal: Back pain, muscle cramps Allergy/Immunology: neg Neurological: neg Psychiatric: neg Sleep : neg   ALLERGIES: No Known Allergies  HOME MEDICATIONS: Outpatient Prescriptions Prior to Visit  Medication Sig Dispense Refill  . divalproex (DEPAKOTE ER) 500 MG 24 hr tablet 3 tabs po bid 12 tablet 0  . phenytoin (DILANTIN) 100 MG ER capsule Take 2 capsules (200 mg total) by mouth at bedtime. 180 capsule 0  . terbinafine (LAMISIL) 250 MG tablet TAKE 1 TABLET BY MOUTH EVERY DAY 14 tablet 0  . triamcinolone ointment (KENALOG) 0.1 % Apply 1 application topically 2 (two) times daily. To lower legs, no more than 10 straight days 30 g 1   No facility-administered medications prior to visit.    PAST MEDICAL HISTORY: Past Medical History  Diagnosis Date  . Seizure ([place])   . Head injury   . Encounter for long-term (current) use of other medications   . Vasculitis ([place]) 11/2014    PAST SURGICAL HISTORY: Past Surgical History  Procedure Laterality Date  . Toe internal fixation w/ compression screw    . Plate on head      FAMILY HISTORY: Family History  Problem Relation Age of Onset  . Diabetes Father     SOCIAL HISTORY: Social History   Social History  . Marital Status: Married    Spouse Name: N/A  . Number of Children: 0  .  Years of Education: 12   Occupational History  .      Not working   Social History Main Topics  . Smoking status: Former Smoker -- 0.50 packs/day    Quit date: 10/21/2014  . Smokeless tobacco: Never Used  . Alcohol Use: No  . Drug Use: No  . Sexual Activity: Not on file   Other Topics Concern  . Not on file   Social History Narrative   Patient is currently not [place] school education some college. Patient  Is single and lives with his mother.      Right handed.   Caffeine one cup daily.     PHYSICAL EXAM  Filed Vitals:   02/28/15 1022  BP: 151/87  Pulse: 74  Height: 5\' 11"  (1.803 m)  Weight: 231 lb 6.4 oz (104.962 kg)   Body mass index is 32.29 kg/(m^2). Generalized: In no acute distress Neck: Supple, no carotid bruits  Musculoskeletal: No deformity  Neurological examination Mentation: Alert oriented to time, place, history taking, and causual conversation Cranial nerve II-XII: Pupils were equal round reactive to light. Extraocular movements were full. Visual field were full on confrontational test. Bilateral fundi were sharp. Facial sensation and strength were normal. Hearing was intact to finger rubbing bilaterally. Uvula tongue midline. Head turning and shoulder shrug and were normal and symmetric.Tongue protrusion into cheek strength was normal. Motor: Normal tone, bulk and strength. Sensory: Intact to fine touch, pinprick, preserved vibratory sensation, and proprioception at toes. Coordination: Normal finger to nose, heel-to-shin bilaterally  Gait: Rising up from seated position without assistance, normal stance, without trunk ataxia, moderate stride, good arm swing, smooth turning, able to perform tiptoe, and heel walking without difficulty.  Romberg signs: Negative Deep tendon reflexes: Brachioradialis 2/2, biceps 2/2, triceps 2/2, patellar 2/2, Achilles 2/2, plantar responses were flexor bilaterally.   DIAGNOSTIC DATA (LABS, IMAGING, TESTING) -  ASSESSMENT AND PLAN   50 y.o. year old male  has a past medical history of Seizure; Head injury; and Encounter for long-term (current) use of other medications. here to follow-up.  We will get labs today. Patient gets Depakote through  patient assistance. He has insurance effective this month  Will check labs today, CBC CMP, Dilantin and VPA levels Continue Depakote at current dose just obtained from pt assist Continue Dilantin at current dose will  refill Follow-up yearly and when necessary Dennie Bible, [facility], [facility], [facility] [address] [place], [place] [address] [phone]

## 2015-03-01 ENCOUNTER — Telehealth: Payer: Self-pay

## 2015-03-01 LAB — COMPREHENSIVE METABOLIC PANEL
ALBUMIN: 4.3 g/dL (ref 3.5–5.5)
ALT: 31 IU/L (ref 0–44)
AST: 27 IU/L (ref 0–40)
Albumin/Globulin Ratio: 1.8 (ref 1.1–2.5)
Alkaline Phosphatase: 65 IU/L (ref 39–117)
BUN / CREAT RATIO: 15 (ref 9–20)
BUN: 15 mg/dL (ref 6–24)
Bilirubin Total: 0.3 mg/dL (ref 0.0–1.2)
CO2: 25 mmol/L (ref 18–29)
CREATININE: 0.98 mg/dL (ref 0.76–1.27)
Calcium: 9.4 mg/dL (ref 8.7–10.2)
Chloride: 103 mmol/L (ref 96–106)
GFR, EST AFRICAN AMERICAN: 105 mL/min/{1.73_m2} (ref >59)
GFR, EST NON AFRICAN AMERICAN: 91 mL/min/{1.73_m2} (ref >59)
GLOBULIN, TOTAL: 2.4 g/dL (ref 1.5–4.5)
Glucose: 117 mg/dL — ABNORMAL HIGH (ref 65–99)
Potassium: 4.3 mmol/L (ref 3.5–5.2)
SODIUM: 147 mmol/L — AB (ref 134–144)
TOTAL PROTEIN: 6.7 g/dL (ref 6.0–8.5)

## 2015-03-01 LAB — CBC WITH DIFFERENTIAL/PLATELET
BASOS ABS: 0 10*3/uL (ref 0.0–0.2)
BASOS: 0 %
EOS (ABSOLUTE): 0.2 10*3/uL (ref 0.0–0.4)
EOS: 2 %
HEMATOCRIT: 45.6 % (ref 37.5–51.0)
HEMOGLOBIN: 15.2 g/dL (ref 12.6–17.7)
IMMATURE GRANS (ABS): 0.2 10*3/uL — AB (ref 0.0–0.1)
Immature Granulocytes: 2 %
LYMPHS ABS: 2.4 10*3/uL (ref 0.7–3.1)
LYMPHS: 27 %
MCH: 33 pg (ref 26.6–33.0)
MCHC: 33.3 g/dL (ref 31.5–35.7)
MCV: 99 fL — ABNORMAL HIGH (ref 79–97)
MONOCYTES: 10 %
Monocytes Absolute: 0.9 10*3/uL (ref 0.1–0.9)
NEUTROS ABS: 5.3 10*3/uL (ref 1.4–7.0)
Neutrophils: 59 %
Platelets: 207 10*3/uL (ref 150–379)
RBC: 4.6 x10E6/uL (ref 4.14–5.80)
RDW: 13.4 % (ref 12.3–15.4)
WBC: 9 10*3/uL (ref 3.4–10.8)

## 2015-03-01 LAB — VALPROIC ACID LEVEL: VALPROIC ACID LVL: 104 ug/mL — AB (ref 50–100)

## 2015-03-01 LAB — PHENYTOIN LEVEL, TOTAL: Phenytoin (Dilantin), Serum: 2.6 ug/mL — ABNORMAL LOW (ref 10.0–20.0)

## 2015-03-01 NOTE — Telephone Encounter (Signed)
-----   Message from Dennie Bible, NP sent at 03/01/2015  9:18 AM EST ----- Last seizure 2011. Labs ok continue same dose of Dilantin and Depakote. Please call the patient

## 2015-03-01 NOTE — Telephone Encounter (Signed)
I spoke to mother (patient was sleeping) I gave message to mother and she will relay to patient. Voiced understanding.

## 2015-03-02 ENCOUNTER — Other Ambulatory Visit: Payer: Self-pay | Admitting: *Deleted

## 2015-03-02 DIAGNOSIS — I8390 Asymptomatic varicose veins of unspecified lower extremity: Secondary | ICD-10-CM

## 2015-03-17 ENCOUNTER — Encounter: Payer: Self-pay | Admitting: Vascular Surgery

## 2015-03-25 ENCOUNTER — Ambulatory Visit (INDEPENDENT_AMBULATORY_CARE_PROVIDER_SITE_OTHER): Payer: BLUE CROSS/BLUE SHIELD | Admitting: Vascular Surgery

## 2015-03-25 ENCOUNTER — Encounter: Payer: Self-pay | Admitting: Family Medicine

## 2015-03-25 ENCOUNTER — Ambulatory Visit (INDEPENDENT_AMBULATORY_CARE_PROVIDER_SITE_OTHER): Payer: BLUE CROSS/BLUE SHIELD | Admitting: Family Medicine

## 2015-03-25 ENCOUNTER — Ambulatory Visit (HOSPITAL_COMMUNITY)
Admission: RE | Admit: 2015-03-25 | Discharge: 2015-03-25 | Disposition: A | Discharge status: Home / Self Care | Payer: BLUE CROSS/BLUE SHIELD | Source: Ambulatory Visit | Attending: Vascular Surgery | Admitting: Vascular Surgery

## 2015-03-25 ENCOUNTER — Encounter: Payer: Self-pay | Admitting: Vascular Surgery

## 2015-03-25 VITALS — BP 139/83 | HR 68 | Temp 97.1°F | Resp 16 | Ht 71.0 in | Wt 227.0 lb

## 2015-03-25 VITALS — BP 128/82 | HR 64 | Temp 97.4°F | Ht 71.0 in | Wt 225.6 lb

## 2015-03-25 DIAGNOSIS — I872 Venous insufficiency (chronic) (peripheral): Secondary | ICD-10-CM

## 2015-03-25 DIAGNOSIS — I868 Varicose veins of other specified sites: Secondary | ICD-10-CM | POA: Diagnosis not present

## 2015-03-25 DIAGNOSIS — B356 Tinea cruris: Secondary | ICD-10-CM | POA: Diagnosis not present

## 2015-03-25 DIAGNOSIS — I878 Other specified disorders of veins: Secondary | ICD-10-CM | POA: Diagnosis not present

## 2015-03-25 DIAGNOSIS — R252 Cramp and spasm: Secondary | ICD-10-CM

## 2015-03-25 DIAGNOSIS — R600 Localized edema: Secondary | ICD-10-CM | POA: Diagnosis not present

## 2015-03-25 DIAGNOSIS — I8391 Asymptomatic varicose veins of right lower extremity: Secondary | ICD-10-CM | POA: Insufficient documentation

## 2015-03-25 DIAGNOSIS — I8390 Asymptomatic varicose veins of unspecified lower extremity: Secondary | ICD-10-CM

## 2015-03-25 DIAGNOSIS — Z0000 Encounter for general adult medical examination without abnormal findings: Secondary | ICD-10-CM

## 2015-03-25 NOTE — Progress Notes (Signed)
   HPI  Patient presents today here for routine follow-up.  Last visit he was seen for venous stasis dermatitis which has resolved. His leg swelling is at baseline.  Also his tinea cruris has resolved with Lamisil oral.  He does complain of some intermittent cramping that most likely in the morning that's been going on for a few months. Earlier it was intermittent, now seems to be getting more frequent.  He denies shortness of breath, dyspnea, palpitations He is concerned about the cost of his seizure medication, I encouraged him to talk to his neurologist about this after he checks the price with his new insurance.  Skin lesion there for years. No itching, bleeding, or changing.  PMH: Smoking status noted ROS: Per HPI  Objective: BP 128/82 mmHg  Pulse 64  Temp(Src) 97.4 F (36.3 C) (Oral)  Ht 5\' 11"  (1.803 m)  Wt 225 lb 9.6 oz (102.331 kg)  BMI 31.48 kg/m2 Gen: NAD, alert, cooperative with exam HEENT: NCAT CV: RRR, good S1/S2, no murmur Resp: CTABL, no wheezes, non-labored Ext: 1-2+ pitting edema bilaterally Neuro: Alert and oriented, No gross deficits  Skin: Right medial calf with 12 mm x 16 mm erythematous scaly slightly raised lesion, nontender to palpation, nonindurated, no fluctuance  Assessment and plan:  # Leg cramps Unclear etiology Labs Supportive care, massage, heat Return to clinic if worsening or does not get better than expected  # Tinea cruris Resolved  # Venous stasis dermatitis, venous stasis Venous stasis dermatitis resolved Venous stasis continued  # Healthcare maintenance [place], MD [facility] 03/25/2015, 10:36 AM

## 2015-03-25 NOTE — Progress Notes (Signed)
[facility] [place]  Patient name: Jerry [name] MRN: [id number] DOB: [birth year] Sex: male  REASON FOR CONSULT: Chronic venous insufficiency  HPI: JOHNTHAN [name] is a 50 y.o. male, who has had varicose veins of his left lower extremity for many years which have gradually gotten worse. He works at a job that requires standing on concrete for up to 11 hours a day. He experiences some aching and heaviness in the legs associated with prolonged standing. He is unaware of any previous history of DVT or phlebitis. He has had no bleeding episodes from his varicose veins. He has worn thigh-high compression stockings but is not sure what gradient these are. He denies any history of claudication, rest pain, or nonhealing ulcers.  Past Medical History  Diagnosis Date  . Seizure ([place])   . Head injury   . Encounter for long-term (current) use of other medications   . Vasculitis ([place]) 11/2014  . Varicose veins     Family History  Problem Relation Age of Onset  . Diabetes Father     SOCIAL HISTORY: Social History   Social History  . Marital Status: Married    Spouse Name: N/A  . Number of Children: 0  . Years of Education: 12   Occupational History  .      Not working   Social History Main Topics  . Smoking status: Former Smoker -- 0.50 packs/day    Quit date: 10/21/2014  . Smokeless tobacco: Never Used  . Alcohol Use: No  . Drug Use: No  . Sexual Activity: Not on file   Other Topics Concern  . Not on file   Social History Narrative   Patient is currently not [place] school education some college. Patient  Is single and lives with his mother.    Right handed.   Caffeine one cup daily.    No Known Allergies  Current Outpatient Prescriptions  Medication Sig Dispense Refill  . divalproex (DEPAKOTE ER) 500 MG 24 hr tablet 3 tabs po bid 12 tablet 0  . phenytoin (DILANTIN) 100 MG ER capsule Take 2 capsules (200 mg total) by mouth at bedtime. 180  capsule 3   No current facility-administered medications for this visit.    REVIEW OF SYSTEMS:  [X]  denotes positive finding, [ ]  denotes negative finding Cardiac  Comments:  Chest pain or chest pressure:    Shortness of breath upon exertion:    Short of breath when lying flat:    Irregular heart rhythm:        Vascular    Pain in calf, thigh, or hip brought on by ambulation:    Pain in feet at night that wakes you up from your sleep:     Blood clot in your veins:    Leg swelling:  X       Pulmonary    Oxygen at home:    Productive cough:     Wheezing:         Neurologic    Sudden weakness in arms or legs:     Sudden numbness in arms or legs:     Sudden onset of difficulty speaking or slurred speech:    Temporary loss of vision in one eye:     Problems with dizziness:         Gastrointestinal    Blood in stool:     Vomited blood:         Genitourinary    Burning when urinating:  Blood in urine:        Psychiatric    Major depression:         Hematologic    Bleeding problems:    Problems with blood clotting too easily:        Skin    Rashes or ulcers:        Constitutional    Fever or chills:      PHYSICAL EXAM: Filed Vitals:   03/25/15 1422  BP: 139/83  Pulse: 68  Temp: 97.1 F (36.2 C)  Resp: 16  Height: 5\' 11"  (1.803 m)  Weight: 227 lb (102.967 kg)  SpO2: 98%    GENERAL: The patient is a well-nourished male, in no acute distress. The vital signs are documented above. CARDIAC: There is a regular rate and rhythm.  VASCULAR: I do not detect carotid bruits. He has palpable femoral popliteal and posterior tibial pulses bilaterally. He has mild bilateral lower extremity swelling which is more significant on the left side. PULMONARY: There is good air exchange bilaterally without wheezing or rales. ABDOMEN: Soft and non-tender with normal pitched bowel sounds.  MUSCULOSKELETAL: There are no major deformities or cyanosis. NEUROLOGIC: No focal  weakness or paresthesias are detected. SKIN: He has some hyperpigmentation bilaterally consistent with chronic venous insufficiency. He has some telangiectasias and spider veins along the lateral aspect of his left leg. He has some larger varicose veins along the anterior medial aspect of his left leg. PSYCHIATRIC: The patient has a normal affect.  DATA:   LOWER EXTREMITY VENOUS DUPLEX: I have independently interpreted his lower extremity venous duplex scan. On the left side, there was no evidence of DVT. There is no deep venous reflux on the left. The patient does have significant reflux in the left great saphenous vein.  MEDICAL ISSUES:  CHRONIC VENOUS INSUFFICIENCY: This patient has significant reflux in his left great saphenous vein. There is no significant deep vein reflux. This patient has significant chronic venous insufficiency. I've explained that this is a chronic problem. We have discussed the importance of intermittent leg elevation in the proper positioning for this. In addition, I have discussed the importance of wearing compression stockings when they will be standing or sitting for a long time.  I have encouraged him to stay as active as possible and to avoid prolonged sitting and standing. We have also discussed water aerobics which I think is also very helpful for people with chronic venous insufficiency. He will try the thigh-high compression stockings with a gradient of 20-30 mmHg. I have written him a prescription for this in case once he has are not tight enough. If this does not help, he could potentially be considered for laser ablation of the left great saphenous vein.  Deitra Mayo [facility] [place]: [phone]

## 2015-03-25 NOTE — Patient Instructions (Signed)
Great to see you!  Lets follow up in 4 months  We will call with you r results, if there is anything to explain leg cramps we will let you know

## 2015-03-26 LAB — CBC
HEMATOCRIT: 42.5 % (ref 37.5–51.0)
HEMOGLOBIN: 14.8 g/dL (ref 12.6–17.7)
MCH: 33.7 pg — AB (ref 26.6–33.0)
MCHC: 34.8 g/dL (ref 31.5–35.7)
MCV: 97 fL (ref 79–97)
Platelets: 213 10*3/uL (ref 150–379)
RBC: 4.39 x10E6/uL (ref 4.14–5.80)
RDW: 13.6 % (ref 12.3–15.4)
WBC: 6.6 10*3/uL (ref 3.4–10.8)

## 2015-03-26 LAB — LIPID PANEL
CHOLESTEROL TOTAL: 240 mg/dL — AB (ref 100–199)
Chol/HDL Ratio: 6.5 ratio units — ABNORMAL HIGH (ref 0.0–5.0)
HDL: 37 mg/dL — ABNORMAL LOW (ref >39)
LDL CALC: 163 mg/dL — AB (ref 0–99)
Triglycerides: 198 mg/dL — ABNORMAL HIGH (ref 0–149)
VLDL Cholesterol Cal: 40 mg/dL (ref 5–40)

## 2015-03-26 LAB — CMP14+EGFR
ALT: 43 IU/L (ref 0–44)
AST: 45 IU/L — AB (ref 0–40)
Albumin/Globulin Ratio: 2 (ref 1.1–2.5)
Albumin: 4.2 g/dL (ref 3.5–5.5)
Alkaline Phosphatase: 61 IU/L (ref 39–117)
BUN/Creatinine Ratio: 16 (ref 9–20)
BUN: 13 mg/dL (ref 6–24)
Bilirubin Total: 0.4 mg/dL (ref 0.0–1.2)
CALCIUM: 9.3 mg/dL (ref 8.7–10.2)
CO2: 25 mmol/L (ref 18–29)
CREATININE: 0.8 mg/dL (ref 0.76–1.27)
Chloride: 101 mmol/L (ref 96–106)
GFR calc Af Amer: 122 mL/min/{1.73_m2} (ref >59)
GFR, EST NON AFRICAN AMERICAN: 106 mL/min/{1.73_m2} (ref >59)
GLOBULIN, TOTAL: 2.1 g/dL (ref 1.5–4.5)
GLUCOSE: 78 mg/dL (ref 65–99)
Potassium: 3.7 mmol/L (ref 3.5–5.2)
SODIUM: 142 mmol/L (ref 134–144)
Total Protein: 6.3 g/dL (ref 6.0–8.5)

## 2015-03-26 LAB — TSH: TSH: 2.7 u[IU]/mL (ref 0.450–4.500)

## 2015-04-25 ENCOUNTER — Encounter: Payer: Self-pay | Admitting: Nurse Practitioner

## 2015-04-25 ENCOUNTER — Ambulatory Visit (INDEPENDENT_AMBULATORY_CARE_PROVIDER_SITE_OTHER): Payer: BLUE CROSS/BLUE SHIELD | Admitting: Nurse Practitioner

## 2015-04-25 ENCOUNTER — Ambulatory Visit: Payer: BLUE CROSS/BLUE SHIELD | Admitting: Family Medicine

## 2015-04-25 VITALS — BP 141/78 | HR 67 | Temp 97.1°F | Ht 71.0 in | Wt 230.0 lb

## 2015-04-25 DIAGNOSIS — R609 Edema, unspecified: Secondary | ICD-10-CM

## 2015-04-25 NOTE — Progress Notes (Signed)
   Subjective:    Patient ID: Jerry [name], male    DOB: 03-18-1965, 50 y.o.   MRN: [id number]  HPI PAtient was seen by Dr. Wendi Snipes on 03/25/15 with c/o leg pain- Had started a new job and was on his feet lot and was having lower ext edema. He was diagnosed with venous insufficiency and was rx ted hose. The TED hose are thigh high and they rool down when he is working-they do keep swelling down.    Review of Systems  Constitutional: Negative.   HENT: Negative.   Respiratory: Negative.   Cardiovascular: Negative.   Genitourinary: Negative.   Neurological: Negative.   Psychiatric/Behavioral: Negative.   All other systems reviewed and are negative.      Objective:   Physical Exam  Constitutional: He is oriented to person, place, and time. He appears well-developed and well-nourished.  Cardiovascular: Normal rate, regular rhythm and normal heart sounds.   Pulmonary/Chest: Effort normal and breath sounds normal.  Musculoskeletal: He exhibits no edema (no edmea today).  Neurological: He is alert and oriented to person, place, and time.  Skin: Skin is warm.  Psychiatric: He has a normal mood and affect. His behavior is normal. Judgment and thought content normal.   BP 141/78 mmHg  Pulse 67  Temp(Src) 97.1 F (36.2 C) (Oral)  Ht 5\' 11"  (1.803 m)  Wt 230 lb (104.327 kg)  BMI 32.09 kg/m2        Assessment & Plan:   1. Peripheral edema    Orders Placed This Encounter  Procedures  . Compression stockings    Knee high 20-65mmhg  Dx peripheral edema R60.9   Elevate legs when sitting RTO prn  Mary-Margaret Hassell Done, FNP

## 2015-04-25 NOTE — Patient Instructions (Signed)
Edema °Edema is an abnormal buildup of fluids in your body tissues. Edema is somewhat dependent on gravity to pull the fluid to the lowest place in your body. That makes the condition more common in the legs and thighs (lower extremities). Painless swelling of the feet and ankles is common and becomes more likely as you get older. It is also common in looser tissues, like around your eyes.  °When the affected area is squeezed, the fluid may move out of that spot and leave a dent for a few moments. This dent is called pitting.  °CAUSES  °There are many possible causes of edema. Eating too much salt and being on your feet or sitting for a long time can cause edema in your legs and ankles. Hot weather may make edema worse. Common medical causes of edema include: °· Heart failure. °· Liver disease. °· Kidney disease. °· Weak blood vessels in your legs. °· Cancer. °· An injury. °· Pregnancy. °· Some medications. °· Obesity.  °SYMPTOMS  °Edema is usually painless. Your skin may look swollen or shiny.  °DIAGNOSIS  °Your health care provider may be able to diagnose edema by asking about your medical history and doing a physical exam. You may need to have tests such as X-rays, an electrocardiogram, or blood tests to check for medical conditions that may cause edema.  °TREATMENT  °Edema treatment depends on the cause. If you have heart, liver, or kidney disease, you need the treatment appropriate for these conditions. General treatment may include: °· Elevation of the affected body part above the level of your heart. °· Compression of the affected body part. Pressure from elastic bandages or support stockings squeezes the tissues and forces fluid back into the blood vessels. This keeps fluid from entering the tissues. °· Restriction of fluid and salt intake. °· Use of a water pill (diuretic). These medications are appropriate only for some types of edema. They pull fluid out of your body and make you urinate more often. This  gets rid of fluid and reduces swelling, but diuretics can have side effects. Only use diuretics as directed by your health care provider. °HOME CARE INSTRUCTIONS  °· Keep the affected body part above the level of your heart when you are lying down.   °· Do not sit still or stand for prolonged periods.   °· Do not put anything directly under your knees when lying down. °· Do not wear constricting clothing or garters on your upper legs.   °· Exercise your legs to work the fluid back into your blood vessels. This may help the swelling go down.   °· Wear elastic bandages or support stockings to reduce ankle swelling as directed by your health care provider.   °· Eat a low-salt diet to reduce fluid if your health care provider recommends it.   °· Only take medicines as directed by your health care provider.  °SEEK MEDICAL CARE IF:  °· Your edema is not responding to treatment. °· You have heart, liver, or kidney disease and notice symptoms of edema. °· You have edema in your legs that does not improve after elevating them.   °· You have sudden and unexplained weight gain. °SEEK IMMEDIATE MEDICAL CARE IF:  °· You develop shortness of breath or chest pain.   °· You cannot breathe when you lie down. °· You develop pain, redness, or warmth in the swollen areas.   °· You have heart, liver, or kidney disease and suddenly get edema. °· You have a fever and your symptoms suddenly get worse. °MAKE SURE YOU:  °·   Understand these instructions. °· Will watch your condition. °· Will get help right away if you are not doing well or get worse. °  °This information is not intended to replace advice given to you by your health care provider. Make sure you discuss any questions you have with your health care provider. °  °Document Released: 01/22/2005 Document Revised: 02/12/2014 Document Reviewed: 11/14/2012 °Elsevier Interactive Patient Education ©2016 Elsevier Inc. ° °

## 2015-05-16 ENCOUNTER — Telehealth: Payer: Self-pay | Admitting: Nurse Practitioner

## 2015-05-16 NOTE — Telephone Encounter (Signed)
Pt called asking for a refill on divalproex (DEPAKOTE ER) 500 MG 24 hr tablet. He goes through the pt assistance program.

## 2015-05-16 NOTE — Telephone Encounter (Signed)
Spoke with patient's mother who stated he cannot come to phone at this time. Will call back later.

## 2015-05-16 NOTE — Telephone Encounter (Signed)
Spoke with [facility] patient assistance program  [place], [phone] . She stated patient has 2 refills; she will send one 90 day refill to office, to arrive in 7-10 business days. This office may call [employer] in 45 days to request last 90 day refill.  Will inform patient. LVM for patient advising him of above. Left name, number for any quesitons.

## 2015-05-17 DIAGNOSIS — M79671 Pain in right foot: Secondary | ICD-10-CM | POA: Diagnosis not present

## 2015-05-17 DIAGNOSIS — M216X1 Other acquired deformities of right foot: Secondary | ICD-10-CM | POA: Diagnosis not present

## 2015-05-23 ENCOUNTER — Telehealth: Payer: Self-pay | Admitting: Nurse Practitioner

## 2015-05-23 NOTE — Telephone Encounter (Signed)
Patient 's Depakote was delivered to the office. Called to make him aware Patient relayed he would come pick up. Relayed to patient office hours.  Depakote / Dina Rich [phone].

## 2015-06-02 DIAGNOSIS — M779 Enthesopathy, unspecified: Secondary | ICD-10-CM | POA: Diagnosis not present

## 2015-06-02 DIAGNOSIS — M79675 Pain in left toe(s): Secondary | ICD-10-CM | POA: Diagnosis not present

## 2015-06-21 ENCOUNTER — Encounter: Payer: Self-pay | Admitting: Vascular Surgery

## 2015-06-27 ENCOUNTER — Encounter: Payer: Self-pay | Admitting: Family Medicine

## 2015-06-27 ENCOUNTER — Ambulatory Visit (INDEPENDENT_AMBULATORY_CARE_PROVIDER_SITE_OTHER): Payer: BLUE CROSS/BLUE SHIELD | Admitting: Family Medicine

## 2015-06-27 VITALS — BP 132/86 | HR 59 | Temp 96.9°F

## 2015-06-27 DIAGNOSIS — L989 Disorder of the skin and subcutaneous tissue, unspecified: Secondary | ICD-10-CM | POA: Insufficient documentation

## 2015-06-27 DIAGNOSIS — I878 Other specified disorders of veins: Secondary | ICD-10-CM | POA: Diagnosis not present

## 2015-06-27 DIAGNOSIS — E785 Hyperlipidemia, unspecified: Secondary | ICD-10-CM | POA: Diagnosis not present

## 2015-06-27 MED ORDER — SIMVASTATIN 20 MG PO TABS: 20.0000 mg | ORAL_TABLET | Freq: Every day | ORAL | Status: DC

## 2015-06-27 NOTE — Patient Instructions (Signed)
Great to see you!  Lets see you again in 3 months to follow up for cholesterol  Start simvastatin.

## 2015-06-27 NOTE — Progress Notes (Signed)
   HPI  Patient presents today here to follow-up for cholesterol, leg swelling, skin lesion.  Patient has no complaints, doing well.  Skin lesion is been there for years, not changing, not itching, not irritated.  Cholesterol Not watching his diet or exercising regularly, he is interested in walking, however has not started.  Leg swelling Greatly improved with compression stockings, he has been able to quit wearing them he's gotten ahead so well.   PMH: Smoking status noted ROS: Per HPI  Objective: BP 132/86 mmHg  Pulse 59  Temp(Src) 96.9 F (36.1 C) (Oral) Gen: NAD, alert, cooperative with exam HEENT: NCAT CV: RRR, good S1/S2, no murmur Resp: CTABL, no wheezes, non-labored Ext: 1+ pitting edema bilateral lower extremities Neuro: Alert and oriented, No gross deficits  Skin: Right medial calf with 15 x 15 mm circular erythematous to pink lesion, sharp borders, no induration, warmth, or drainage  Assessment and plan:  # Hyperlipidemia 5.5% 10 year ASCVD risk Starting simvastatin Discussed diet and exercise Recheck labs in 3 months   # Venous stasis Has improved greatly with compression stockings, no complaints  # Skin lesion Unclear etiology, considering that it's been there for years I doubt that it's ringworm, however it has that appearance. Consider biopsy if any changing or other issues    Meds ordered this encounter  Medications  . simvastatin (ZOCOR) 20 MG tablet    Sig: Take 1 tablet (20 mg total) by mouth at bedtime.    Dispense:  90 tablet    Refill:  [place], MD [facility] 06/27/2015, 10:58 AM

## 2015-06-28 ENCOUNTER — Ambulatory Visit: Payer: BLUE CROSS/BLUE SHIELD | Admitting: Vascular Surgery

## 2015-07-18 DIAGNOSIS — M62838 Other muscle spasm: Secondary | ICD-10-CM | POA: Diagnosis not present

## 2015-07-18 DIAGNOSIS — M9902 Segmental and somatic dysfunction of thoracic region: Secondary | ICD-10-CM | POA: Diagnosis not present

## 2015-07-18 DIAGNOSIS — M9901 Segmental and somatic dysfunction of cervical region: Secondary | ICD-10-CM | POA: Diagnosis not present

## 2015-07-18 DIAGNOSIS — M9903 Segmental and somatic dysfunction of lumbar region: Secondary | ICD-10-CM | POA: Diagnosis not present

## 2015-07-20 DIAGNOSIS — M9903 Segmental and somatic dysfunction of lumbar region: Secondary | ICD-10-CM | POA: Diagnosis not present

## 2015-07-20 DIAGNOSIS — M9902 Segmental and somatic dysfunction of thoracic region: Secondary | ICD-10-CM | POA: Diagnosis not present

## 2015-07-20 DIAGNOSIS — M9901 Segmental and somatic dysfunction of cervical region: Secondary | ICD-10-CM | POA: Diagnosis not present

## 2015-07-20 DIAGNOSIS — M62838 Other muscle spasm: Secondary | ICD-10-CM | POA: Diagnosis not present

## 2015-07-21 DIAGNOSIS — M9903 Segmental and somatic dysfunction of lumbar region: Secondary | ICD-10-CM | POA: Diagnosis not present

## 2015-07-21 DIAGNOSIS — M62838 Other muscle spasm: Secondary | ICD-10-CM | POA: Diagnosis not present

## 2015-07-21 DIAGNOSIS — M9901 Segmental and somatic dysfunction of cervical region: Secondary | ICD-10-CM | POA: Diagnosis not present

## 2015-07-21 DIAGNOSIS — M9902 Segmental and somatic dysfunction of thoracic region: Secondary | ICD-10-CM | POA: Diagnosis not present

## 2015-07-25 DIAGNOSIS — M9903 Segmental and somatic dysfunction of lumbar region: Secondary | ICD-10-CM | POA: Diagnosis not present

## 2015-07-25 DIAGNOSIS — M9902 Segmental and somatic dysfunction of thoracic region: Secondary | ICD-10-CM | POA: Diagnosis not present

## 2015-07-25 DIAGNOSIS — M62838 Other muscle spasm: Secondary | ICD-10-CM | POA: Diagnosis not present

## 2015-07-25 DIAGNOSIS — M9901 Segmental and somatic dysfunction of cervical region: Secondary | ICD-10-CM | POA: Diagnosis not present

## 2015-07-27 DIAGNOSIS — M9903 Segmental and somatic dysfunction of lumbar region: Secondary | ICD-10-CM | POA: Diagnosis not present

## 2015-07-27 DIAGNOSIS — M62838 Other muscle spasm: Secondary | ICD-10-CM | POA: Diagnosis not present

## 2015-07-27 DIAGNOSIS — M9902 Segmental and somatic dysfunction of thoracic region: Secondary | ICD-10-CM | POA: Diagnosis not present

## 2015-07-27 DIAGNOSIS — M9901 Segmental and somatic dysfunction of cervical region: Secondary | ICD-10-CM | POA: Diagnosis not present

## 2015-07-28 DIAGNOSIS — M9902 Segmental and somatic dysfunction of thoracic region: Secondary | ICD-10-CM | POA: Diagnosis not present

## 2015-07-28 DIAGNOSIS — M9903 Segmental and somatic dysfunction of lumbar region: Secondary | ICD-10-CM | POA: Diagnosis not present

## 2015-07-28 DIAGNOSIS — M62838 Other muscle spasm: Secondary | ICD-10-CM | POA: Diagnosis not present

## 2015-07-28 DIAGNOSIS — M9901 Segmental and somatic dysfunction of cervical region: Secondary | ICD-10-CM | POA: Diagnosis not present

## 2015-08-01 DIAGNOSIS — M9903 Segmental and somatic dysfunction of lumbar region: Secondary | ICD-10-CM | POA: Diagnosis not present

## 2015-08-01 DIAGNOSIS — M9901 Segmental and somatic dysfunction of cervical region: Secondary | ICD-10-CM | POA: Diagnosis not present

## 2015-08-01 DIAGNOSIS — M62838 Other muscle spasm: Secondary | ICD-10-CM | POA: Diagnosis not present

## 2015-08-01 DIAGNOSIS — M9902 Segmental and somatic dysfunction of thoracic region: Secondary | ICD-10-CM | POA: Diagnosis not present

## 2015-08-03 DIAGNOSIS — M9902 Segmental and somatic dysfunction of thoracic region: Secondary | ICD-10-CM | POA: Diagnosis not present

## 2015-08-03 DIAGNOSIS — M9901 Segmental and somatic dysfunction of cervical region: Secondary | ICD-10-CM | POA: Diagnosis not present

## 2015-08-03 DIAGNOSIS — M9903 Segmental and somatic dysfunction of lumbar region: Secondary | ICD-10-CM | POA: Diagnosis not present

## 2015-08-03 DIAGNOSIS — M62838 Other muscle spasm: Secondary | ICD-10-CM | POA: Diagnosis not present

## 2015-08-04 DIAGNOSIS — M9901 Segmental and somatic dysfunction of cervical region: Secondary | ICD-10-CM | POA: Diagnosis not present

## 2015-08-04 DIAGNOSIS — M62838 Other muscle spasm: Secondary | ICD-10-CM | POA: Diagnosis not present

## 2015-08-04 DIAGNOSIS — M9902 Segmental and somatic dysfunction of thoracic region: Secondary | ICD-10-CM | POA: Diagnosis not present

## 2015-08-04 DIAGNOSIS — M9903 Segmental and somatic dysfunction of lumbar region: Secondary | ICD-10-CM | POA: Diagnosis not present

## 2015-08-08 DIAGNOSIS — M62838 Other muscle spasm: Secondary | ICD-10-CM | POA: Diagnosis not present

## 2015-08-08 DIAGNOSIS — M9902 Segmental and somatic dysfunction of thoracic region: Secondary | ICD-10-CM | POA: Diagnosis not present

## 2015-08-08 DIAGNOSIS — M9901 Segmental and somatic dysfunction of cervical region: Secondary | ICD-10-CM | POA: Diagnosis not present

## 2015-08-08 DIAGNOSIS — M9903 Segmental and somatic dysfunction of lumbar region: Secondary | ICD-10-CM | POA: Diagnosis not present

## 2015-08-10 DIAGNOSIS — M9903 Segmental and somatic dysfunction of lumbar region: Secondary | ICD-10-CM | POA: Diagnosis not present

## 2015-08-10 DIAGNOSIS — M9901 Segmental and somatic dysfunction of cervical region: Secondary | ICD-10-CM | POA: Diagnosis not present

## 2015-08-10 DIAGNOSIS — M9902 Segmental and somatic dysfunction of thoracic region: Secondary | ICD-10-CM | POA: Diagnosis not present

## 2015-08-10 DIAGNOSIS — M62838 Other muscle spasm: Secondary | ICD-10-CM | POA: Diagnosis not present

## 2015-08-11 DIAGNOSIS — M9903 Segmental and somatic dysfunction of lumbar region: Secondary | ICD-10-CM | POA: Diagnosis not present

## 2015-08-11 DIAGNOSIS — M9902 Segmental and somatic dysfunction of thoracic region: Secondary | ICD-10-CM | POA: Diagnosis not present

## 2015-08-11 DIAGNOSIS — M9901 Segmental and somatic dysfunction of cervical region: Secondary | ICD-10-CM | POA: Diagnosis not present

## 2015-08-11 DIAGNOSIS — M62838 Other muscle spasm: Secondary | ICD-10-CM | POA: Diagnosis not present

## 2015-08-15 DIAGNOSIS — M9903 Segmental and somatic dysfunction of lumbar region: Secondary | ICD-10-CM | POA: Diagnosis not present

## 2015-08-15 DIAGNOSIS — M62838 Other muscle spasm: Secondary | ICD-10-CM | POA: Diagnosis not present

## 2015-08-15 DIAGNOSIS — M9901 Segmental and somatic dysfunction of cervical region: Secondary | ICD-10-CM | POA: Diagnosis not present

## 2015-08-15 DIAGNOSIS — M9902 Segmental and somatic dysfunction of thoracic region: Secondary | ICD-10-CM | POA: Diagnosis not present

## 2015-08-18 DIAGNOSIS — M9903 Segmental and somatic dysfunction of lumbar region: Secondary | ICD-10-CM | POA: Diagnosis not present

## 2015-08-18 DIAGNOSIS — M62838 Other muscle spasm: Secondary | ICD-10-CM | POA: Diagnosis not present

## 2015-08-18 DIAGNOSIS — M9902 Segmental and somatic dysfunction of thoracic region: Secondary | ICD-10-CM | POA: Diagnosis not present

## 2015-08-18 DIAGNOSIS — M9901 Segmental and somatic dysfunction of cervical region: Secondary | ICD-10-CM | POA: Diagnosis not present

## 2015-08-22 ENCOUNTER — Encounter: Payer: Self-pay | Admitting: Family

## 2015-08-22 ENCOUNTER — Ambulatory Visit (INDEPENDENT_AMBULATORY_CARE_PROVIDER_SITE_OTHER): Payer: BLUE CROSS/BLUE SHIELD | Admitting: Family

## 2015-08-22 VITALS — BP 126/83 | HR 72 | Temp 97.3°F | Ht 71.0 in | Wt 232.0 lb

## 2015-08-22 DIAGNOSIS — R609 Edema, unspecified: Secondary | ICD-10-CM

## 2015-08-22 DIAGNOSIS — M9901 Segmental and somatic dysfunction of cervical region: Secondary | ICD-10-CM | POA: Diagnosis not present

## 2015-08-22 DIAGNOSIS — M62838 Other muscle spasm: Secondary | ICD-10-CM | POA: Diagnosis not present

## 2015-08-22 DIAGNOSIS — M9902 Segmental and somatic dysfunction of thoracic region: Secondary | ICD-10-CM | POA: Diagnosis not present

## 2015-08-22 DIAGNOSIS — M9903 Segmental and somatic dysfunction of lumbar region: Secondary | ICD-10-CM | POA: Diagnosis not present

## 2015-08-22 NOTE — Patient Instructions (Signed)

## 2015-08-22 NOTE — Progress Notes (Signed)
   Subjective:    Patient ID: Jerry [name], male    DOB: 21-Mar-1965, 50 y.o.   MRN: [id number]  HPI PT presents to the office today with mild swelling in bilateral ankles. PT states he noticed it over the last 6-8 months. PT states when he takes off his sock at night he can see "a mark" and is very worried that his ankles should not be swelling. Pt states he had compression hose, but has not worn them in the last 3 months.   PT states he got a new pair of shoes last week and has an abrasion on the top of his right foot because the shoe was "rubbing it".    Review of Systems  HENT: Negative.   Respiratory: Negative.  Negative for cough, chest tightness and stridor.   Cardiovascular: Positive for leg swelling.  Genitourinary: Negative.   Musculoskeletal: Negative.   Skin: Negative.        Objective:   Physical Exam  Constitutional: He is oriented to person, place, and time. He appears well-developed and well-nourished. No distress.  HENT:  Head: Normocephalic.  Cardiovascular: Normal rate, regular rhythm, normal heart sounds and intact distal pulses.   No murmur heard. Pulmonary/Chest: Effort normal and breath sounds normal. No respiratory distress. He has no wheezes.  Abdominal: Soft. Bowel sounds are normal. He exhibits no distension. There is no tenderness.  Musculoskeletal: Normal range of motion. He exhibits edema (trace in bilateral ankles). He exhibits no tenderness.  Neurological: He is alert and oriented to person, place, and time.  Skin: Skin is warm and dry. No rash noted. No erythema.  Small abrasion on right foot 3mmX1mm, healing blister on right small toe   Psychiatric: He has a normal mood and affect. His behavior is normal. Judgment and thought content normal.  Vitals reviewed.     BP 126/83 mmHg  Pulse 72  Temp(Src) 97.3 F (36.3 C) (Oral)  Ht 5\' 11"  (1.803 m)  Wt 232 lb (105.235 kg)  BMI 32.37 kg/m2     Assessment & Plan:  1. Peripheral edema -Low  salt diet -Compression hose every day -Keep feet elevated -Discussed getting good shoe support RTO prn  Evelina Dun, FNP

## 2015-08-25 DIAGNOSIS — M9901 Segmental and somatic dysfunction of cervical region: Secondary | ICD-10-CM | POA: Diagnosis not present

## 2015-08-25 DIAGNOSIS — M62838 Other muscle spasm: Secondary | ICD-10-CM | POA: Diagnosis not present

## 2015-08-25 DIAGNOSIS — M9902 Segmental and somatic dysfunction of thoracic region: Secondary | ICD-10-CM | POA: Diagnosis not present

## 2015-08-25 DIAGNOSIS — M9903 Segmental and somatic dysfunction of lumbar region: Secondary | ICD-10-CM | POA: Diagnosis not present

## 2015-09-29 ENCOUNTER — Ambulatory Visit: Payer: BLUE CROSS/BLUE SHIELD | Admitting: Family Medicine

## 2015-10-05 ENCOUNTER — Telehealth: Payer: Self-pay | Admitting: Family Medicine

## 2015-10-05 NOTE — Telephone Encounter (Signed)
DENIED 

## 2015-10-11 ENCOUNTER — Telehealth: Payer: Self-pay | Admitting: Nurse Practitioner

## 2015-10-11 NOTE — Telephone Encounter (Signed)
Application received.  Will attach to medication when received so pt can fill out.

## 2015-10-11 NOTE — Telephone Encounter (Signed)
Patient is calling to get medication divalproex (DEPAKOTE ER) 500 MG 24 hr tablet through Patient Assistance. He is almost out of medication. Please call to discuss.

## 2015-10-11 NOTE — Telephone Encounter (Signed)
Spoke to pt and relayed that called about his Depakote Er 1500mg  po bid.  (this is last refill for his enrollment that ends 12-14-15).  They will send medication to our office.  I also asked that they fax new application to [place] as well. This cannot be sent in prior to 11-13-15.  I called [facility] and spoke to tiffany.

## 2015-10-17 ENCOUNTER — Telehealth: Payer: Self-pay | Admitting: Nurse Practitioner

## 2015-10-17 NOTE — Telephone Encounter (Signed)
Called and spoke to patient and relayed his Depakote is ready to pick up . Patient understood all details and he will be here to pick up RX Depakote 500 mg . Patient assistance sent six bottles.

## 2016-01-19 ENCOUNTER — Telehealth: Payer: Self-pay | Admitting: Nurse Practitioner

## 2016-01-19 NOTE — Telephone Encounter (Signed)
Patient requesting refill of divalproex (DEPAKOTE ER) 500 MG 24 hr tablet that she gets from patient assistance.

## 2016-01-23 NOTE — Telephone Encounter (Signed)
Patient is with Jerry [name] Patient assistance He needs his Depakote waiting on Patient to come to the office to sign paper work.

## 2016-01-24 ENCOUNTER — Other Ambulatory Visit: Payer: Self-pay | Admitting: Nurse Practitioner

## 2016-01-24 ENCOUNTER — Telehealth: Payer: Self-pay | Admitting: Nurse Practitioner

## 2016-01-24 MED ORDER — DEPAKOTE ER 500 MG PO TB24: ORAL_TABLET | ORAL | 3 refills | Status: DC

## 2016-01-24 MED ORDER — DIVALPROEX SODIUM ER 500 MG PO TB24: ORAL_TABLET | ORAL | 3 refills | Status: DC

## 2016-01-24 MED ORDER — PHENYTOIN SODIUM EXTENDED 100 MG PO CAPS: 200.0000 mg | ORAL_CAPSULE | Freq: Every day | ORAL | 3 refills | Status: DC

## 2016-01-24 NOTE — Telephone Encounter (Signed)
Patient came to office top sign paper for patient assistance Depakote .  Patient is wanting to know if he can go back on Brand Name Dilantin.?  Patient relays his Dilantin generic is costing him over $60.00 Dollars a month at he  does not have a job or any income.  I relayed to patient that I would have to send a message back to [facility] for her advise on Brand name Dilantin.   [name] if you decide you want to Do Brand Name Dilantin please attach RX and patient has already signed paper.

## 2016-01-24 NOTE — Telephone Encounter (Signed)
I have faxed Brand Name Depakote 500 mg  to [facility] Patient assistance  Telephone [phone]- fax [phone].  Waiting for approval .

## 2016-01-24 NOTE — Telephone Encounter (Signed)
May have brand through pt assist.

## 2016-01-24 NOTE — Telephone Encounter (Signed)
Faxed to [employer] Patient assistance for Dilantin 200 mg telephone [phone] - fax [phone] . Waiting for approval status

## 2016-02-02 NOTE — Telephone Encounter (Signed)
Pt called inquiring about when he could get refill. He will run out Saturday night 02/04/16. Please call

## 2016-02-03 NOTE — Telephone Encounter (Addendum)
Spoke to pt.  He needs BN Depakote called to [place] since he has not heard from [place] for pt assistance.  I relayed I will call and see what I can find out.  I called and spoke to Tiffany at [facility] she stated they needed more information before they can process.  (needed clarification on number in household, DEA # and expiration of DEA).  I filled in what I could after speaking to pt.  He will be out of medication 02/04/16.  I called in prescription for BN Depakote ER 500mg  tabs (take 3 tabs BID)  # 60 to [place], pharmacist.  (enough for 10 days).  I tried to use a depakote pharm card and this would not work for him with no insurance.  They did have a card on file for him at [facility] and that gave price to be $ 359.50.  They did not have in stock and would have to send for.  Pharmacist would call pt and see what he wanted to do.  I called [employer] to check that they received application and they had not (takes 24hours to process).  I did receive fax confirmation.  Will check on Wednesday when in the office.

## 2016-02-08 NOTE — Telephone Encounter (Signed)
Called and tried to reach patient x3 about his patient assistance Depakote.

## 2016-02-08 NOTE — Telephone Encounter (Signed)
I called and spoke to Zelda at Pt assistance.  She stated they received and pt approved thru 02-06-2017.  Order went out yesterday due to arrive at [facility] address 7-10 days.

## 2016-02-08 NOTE — Telephone Encounter (Signed)
Patient called me back he relayed he had enough Depakote until 02/15/2016.

## 2016-02-13 NOTE — Telephone Encounter (Signed)
Called and spoke to [name] Jerry [name]'s  Depakote will be here by end of business day 02/14/2016

## 2016-02-13 NOTE — Telephone Encounter (Signed)
Patient is calling to see if his Depakote has arrived. He will be out on 02-16-16.

## 2016-02-14 ENCOUNTER — Telehealth: Payer: Self-pay | Admitting: Nurse Practitioner

## 2016-02-14 NOTE — Telephone Encounter (Signed)
Called Patient he will pick up his Depakote at [employer] [facility] [employer]. Depakote  500 mg  ER  Six bottles received.  [employer] Patient assistance Program.  Please call me when you have three weeks left on your prescription. In order for me to get medications shipped, I need to know three weeks before because prescriptions can take 7-10 business days for delivery.   Thank you,   Rance Muir  Patient Assistance Program [facility] [phone]

## 2016-02-14 NOTE — Telephone Encounter (Signed)
Patient Depakote arrived from [place] Patient assistance . Depakote ER 500 mg. Six bottles came . Patient will come to the office to [place] up.

## 2016-02-17 ENCOUNTER — Telehealth: Payer: Self-pay | Admitting: Nurse Practitioner

## 2016-02-17 NOTE — Telephone Encounter (Signed)
Brenda/[employer] [phone] called request clarification on mg for phenytoin (DILANTIN) 100 MG ER capsule .

## 2016-02-17 NOTE — Telephone Encounter (Signed)
I called and spoke to [place].  I relayed that pt takes dilantin 100mg  capsules (2 capsules po qhs).  She verbalized understanding.

## 2016-02-27 ENCOUNTER — Telehealth: Payer: Self-pay | Admitting: *Deleted

## 2016-02-27 ENCOUNTER — Ambulatory Visit: Payer: BLUE CROSS/BLUE SHIELD | Admitting: Nurse Practitioner

## 2016-02-27 ENCOUNTER — Telehealth: Payer: Self-pay | Admitting: Neurology

## 2016-02-27 NOTE — Telephone Encounter (Signed)
Patient was approved for Dilantin Patient assistance. Patient come to the office and pick up RX.

## 2016-02-27 NOTE — Telephone Encounter (Signed)
No showed follow up appointment. 

## 2016-02-28 ENCOUNTER — Encounter: Payer: Self-pay | Admitting: Nurse Practitioner

## 2016-05-01 ENCOUNTER — Ambulatory Visit: Payer: BLUE CROSS/BLUE SHIELD | Admitting: Nurse Practitioner

## 2016-05-08 ENCOUNTER — Telehealth: Payer: Self-pay | Admitting: Nurse Practitioner

## 2016-05-08 NOTE — Telephone Encounter (Signed)
Depakote patient assistance. [facility] [phone] Dilantin [employer] - [phone].

## 2016-05-09 NOTE — Telephone Encounter (Signed)
Called and Left message for Patient . His Depakote and Dilantin should be here next week shipment to the office.

## 2016-05-09 NOTE — Telephone Encounter (Signed)
Pt said he needs new RX for these medications. He is wanting to pick up next week.

## 2016-05-10 ENCOUNTER — Telehealth: Payer: Self-pay | Admitting: Nurse Practitioner

## 2016-05-10 NOTE — Telephone Encounter (Signed)
Called and spoke to Patient's mother because patient was sleeping relayed that Patient's Dilantin is her to pick up. Depakote has not arrived yet.

## 2016-05-15 NOTE — Telephone Encounter (Signed)
Depakote ER 500 mg has arrive from [place] Patient assistance.  Six bottle's Patient aware.   Attached in Patient's bag.   Please call me when you have three weeks left on your prescription. In order for me to get medication shipped, I need to know three weeks before because prescription takes 7-10 business days for Delivery.  Thanks Rance Muir  Patient assistance program [facility]. [phone].

## 2016-05-31 ENCOUNTER — Ambulatory Visit (INDEPENDENT_AMBULATORY_CARE_PROVIDER_SITE_OTHER): Payer: BLUE CROSS/BLUE SHIELD | Admitting: Nurse Practitioner

## 2016-05-31 ENCOUNTER — Encounter: Payer: Self-pay | Admitting: Nurse Practitioner

## 2016-05-31 VITALS — BP 165/88 | HR 61 | Ht 70.0 in | Wt 234.0 lb

## 2016-05-31 DIAGNOSIS — R569 Unspecified convulsions: Secondary | ICD-10-CM | POA: Diagnosis not present

## 2016-05-31 DIAGNOSIS — Z5181 Encounter for therapeutic drug level monitoring: Secondary | ICD-10-CM | POA: Diagnosis not present

## 2016-05-31 NOTE — Progress Notes (Signed)
I have reviewed and agreed above plan. 

## 2016-05-31 NOTE — Patient Instructions (Signed)
Will check labs today, CBC CMP, to monitor side effects of seizure meds Dilantin and VPA levels to make sure therapeutic/vs toxcity Continue Depakote at current dose  pt assist Continue Dilantin at current dose will refill Follow-up yearly and when necessary Call for seizure activity

## 2016-05-31 NOTE — Progress Notes (Signed)
[facility]  PATIENT: Jerry [name] DOB: 1965/10/13   REASON FOR VISIT: Follow-up for seizure disorder HISTORY FROM: Patient    HISTORY OF PRESENT ILLNESS: HISTORY: Jerry [name] is a 51 year old right-handed Caucasian male, alone at visit, to followup his seizure disorder Last clinical visit was our office was in November 2013 with Jerry [name], he had a history of seizure disorder since subdural hematoma after a traumatic brain injury in 1984, previous abnormal EEG, he also had some anxiety disorder, he is no longer working. He lives with his mother, driving, last clinical seizure was September 2011, He has been on long-term combination therapy of Depakote ER 500 mg 2 tablets twice a day, high dose of Dilantin 100 mg 3 tablets twice a day, plus chewable 50 mg once a day He is getting assistance program for Depakote ER, coordinated through our office He is overall doing well, tolerating medications, no recurrent seizure  UPDATE 04/26/18[name] 51 year old male returns for follow-up. He has a history of seizure disorder and is currently on Depakote from  patient assistance and Dilantin 200 mg at night. Last seizure activity was 2011. He has had no new medical issues since last seen. He drives a car without difficulty. He returns for reevaluation and he needs labs to monitor any adverse effects of his seizure medications.  REVIEW OF SYSTEMS: Full 14 system review of systems performed and notable only for those listed, all others are neg:  Constitutional: neg  Cardiovascular: neg Ear/Nose/Throat: neg  Skin: neg Eyes: neg Respiratory: neg Gastroitestinal: neg  Hematology/Lymphatic: neg  Endocrine: neg Musculoskeletal: neg Allergy/Immunology: neg Neurological: neg Psychiatric: neg Sleep : neg   ALLERGIES: No Known Allergies  HOME MEDICATIONS: Outpatient Medications Prior to Visit  Medication Sig Dispense Refill  . DEPAKOTE ER 500 MG 24 hr  tablet 3 tabs po bid 540 tablet 3  . phenytoin (DILANTIN) 100 MG ER capsule Take 2 capsules (200 mg total) by mouth at bedtime. 180 capsule 3  . simvastatin (ZOCOR) 20 MG tablet Take 1 tablet (20 mg total) by mouth at bedtime. (Patient not taking: Reported on 05/31/2016) 90 tablet 3  . triamcinolone ointment (KENALOG) 0.1 % Apply 1 application topically 2 (two) times daily.     No facility-administered medications prior to visit.     PAST MEDICAL HISTORY: Past Medical History:  Diagnosis Date  . Encounter for long-term (current) use of other medications   . Head injury   . Seizure ([place])   . Varicose veins   . Vasculitis ([place]) 11/2014    PAST SURGICAL HISTORY: Past Surgical History:  Procedure Laterality Date  . plate on head    . TOE INTERNAL FIXATION W/ COMPRESSION SCREW      FAMILY HISTORY: Family History  Problem Relation Age of Onset  . Diabetes Father     SOCIAL HISTORY: Social History   Social History  . Marital status: Married    Spouse name: N/A  . Number of children: 0  . Years of education: 12   Occupational History  .      Not working   Social History Main Topics  . Smoking status: Former Smoker    Packs/day: 0.50    Quit date: 10/21/2014  . Smokeless tobacco: Never Used  . Alcohol use No  . Drug use: No  . Sexual activity: Not on file   Other Topics Concern  . Not on file   Social History Narrative   Patient is currently not [employer]  school education some college. Patient  Is single and lives with his mother.    Right handed.   Caffeine one cup daily.     PHYSICAL EXAM  Vitals:   05/31/16 0743  BP: (!) 165/88  Pulse: 61  Weight: 234 lb (106.1 kg)  Height: 5\' 10"  (1.778 m)   Body mass index is 33.58 kg/m. Generalized: In no acute distress Neck: Supple, no carotid bruits  Musculoskeletal: No deformity  Neurological examination Mentation: Alert oriented to time, place, history taking, and causual conversation Cranial nerve  II-XII: Pupils were equal round reactive to light. Extraocular movements were full. Visual field were full on confrontational test. Bilateral fundi were sharp. Facial sensation and strength were normal. Hearing was intact to finger rubbing bilaterally. Uvula tongue midline. Head turning and shoulder shrug and were normal and symmetric.Tongue protrusion into cheek strength was normal. Motor: Normal tone, bulk and strength. Sensory: Intact to fine touch, pinprick, preserved vibratory sensation, In the upper and lower extremities Coordination: Normal finger to nose, heel-to-shin bilaterally normal, no dysmetria Gait: Rising up from seated position without assistance, normal stance,  moderate stride, good arm swing, smooth turning, able to perform tiptoe, and heel walking without difficulty. Tandem gait normal Romberg signs: Negative Deep tendon reflexes: Brachioradialis 2/2, biceps 2/2, triceps 2/2, patellar 2/2, Achilles 2/2, plantar responses were flexor bilaterally.   DIAGNOSTIC DATA (LABS, IMAGING, TESTING) -  ASSESSMENT AND PLAN   51 y.o. year old male  has a past medical history of Seizure; Head injury; hypertension and hyperlipidemia  here to follow-up.   Patient gets Depakote through  patient assistance.   Will check labs today, CBC CMP, to monitor side effects of seizure meds Dilantin and VPA levels to make sure therapeutic/vs toxcity Continue Depakote at current dose  pt assist Continue Dilantin at current dose will refill once labs are back Follow-up yearly and when necessary Call for seizure activity Jerry [name], [facility], [facility], [facility] [address] [place], [place] [address] [phone]

## 2016-06-01 ENCOUNTER — Telehealth: Payer: Self-pay | Admitting: *Deleted

## 2016-06-01 ENCOUNTER — Other Ambulatory Visit: Payer: Self-pay | Admitting: Nurse Practitioner

## 2016-06-01 LAB — COMPREHENSIVE METABOLIC PANEL
ALT: 21 IU/L (ref 0–44)
AST: 22 IU/L (ref 0–40)
Albumin/Globulin Ratio: 1.7 (ref 1.2–2.2)
Albumin: 4.4 g/dL (ref 3.5–5.5)
Alkaline Phosphatase: 85 IU/L (ref 39–117)
BILIRUBIN TOTAL: 0.3 mg/dL (ref 0.0–1.2)
BUN/Creatinine Ratio: 13 (ref 9–20)
BUN: 11 mg/dL (ref 6–24)
CHLORIDE: 102 mmol/L (ref 96–106)
CO2: 27 mmol/L (ref 18–29)
Calcium: 9.1 mg/dL (ref 8.7–10.2)
Creatinine, Ser: 0.84 mg/dL (ref 0.76–1.27)
GFR, EST AFRICAN AMERICAN: 119 mL/min/{1.73_m2} (ref >59)
GFR, EST NON AFRICAN AMERICAN: 103 mL/min/{1.73_m2} (ref >59)
Globulin, Total: 2.6 g/dL (ref 1.5–4.5)
Glucose: 82 mg/dL (ref 65–99)
Potassium: 4.2 mmol/L (ref 3.5–5.2)
Sodium: 145 mmol/L — ABNORMAL HIGH (ref 134–144)
TOTAL PROTEIN: 7 g/dL (ref 6.0–8.5)

## 2016-06-01 LAB — CBC WITH DIFFERENTIAL/PLATELET
BASOS ABS: 0 10*3/uL (ref 0.0–0.2)
Basos: 0 %
EOS (ABSOLUTE): 0.1 10*3/uL (ref 0.0–0.4)
Eos: 1 %
HEMOGLOBIN: 14.2 g/dL (ref 13.0–17.7)
Hematocrit: 41.4 % (ref 37.5–51.0)
IMMATURE GRANS (ABS): 0.1 10*3/uL (ref 0.0–0.1)
IMMATURE GRANULOCYTES: 1 %
LYMPHS: 29 %
Lymphocytes Absolute: 3 10*3/uL (ref 0.7–3.1)
MCH: 33.6 pg — ABNORMAL HIGH (ref 26.6–33.0)
MCHC: 34.3 g/dL (ref 31.5–35.7)
MCV: 98 fL — ABNORMAL HIGH (ref 79–97)
MONOCYTES: 9 %
Monocytes Absolute: 0.9 10*3/uL (ref 0.1–0.9)
NEUTROS ABS: 6.4 10*3/uL (ref 1.4–7.0)
Neutrophils: 60 %
Platelets: 254 10*3/uL (ref 150–379)
RBC: 4.22 x10E6/uL (ref 4.14–5.80)
RDW: 14.1 % (ref 12.3–15.4)
WBC: 10.6 10*3/uL (ref 3.4–10.8)

## 2016-06-01 LAB — VALPROIC ACID LEVEL: VALPROIC ACID LVL: 89 ug/mL (ref 50–100)

## 2016-06-01 LAB — PHENYTOIN LEVEL, TOTAL: PHENYTOIN (DILANTIN), SERUM: 2.3 ug/mL — AB (ref 10.0–20.0)

## 2016-06-01 MED ORDER — PHENYTOIN SODIUM EXTENDED 100 MG PO CAPS: 200.0000 mg | ORAL_CAPSULE | Freq: Every day | ORAL | 3 refills | Status: DC

## 2016-06-01 NOTE — Telephone Encounter (Signed)
Per Daun Peacock, NP spoke with patient and informed him his labs are stable. He verbalized understanding, appreciation.

## 2016-06-05 ENCOUNTER — Ambulatory Visit: Payer: BLUE CROSS/BLUE SHIELD | Admitting: Family Medicine

## 2016-06-06 ENCOUNTER — Ambulatory Visit (INDEPENDENT_AMBULATORY_CARE_PROVIDER_SITE_OTHER): Payer: BLUE CROSS/BLUE SHIELD | Admitting: Physician Assistant

## 2016-06-06 ENCOUNTER — Encounter: Payer: Self-pay | Admitting: Physician Assistant

## 2016-06-06 VITALS — BP 148/88 | HR 63 | Temp 97.3°F | Ht 70.0 in | Wt 232.4 lb

## 2016-06-06 DIAGNOSIS — R311 Benign essential microscopic hematuria: Secondary | ICD-10-CM | POA: Diagnosis not present

## 2016-06-06 DIAGNOSIS — R3989 Other symptoms and signs involving the genitourinary system: Secondary | ICD-10-CM

## 2016-06-06 DIAGNOSIS — K580 Irritable bowel syndrome with diarrhea: Secondary | ICD-10-CM

## 2016-06-06 DIAGNOSIS — I878 Other specified disorders of veins: Secondary | ICD-10-CM | POA: Diagnosis not present

## 2016-06-06 LAB — URINALYSIS, COMPLETE
Bilirubin, UA: NEGATIVE
Glucose, UA: NEGATIVE
KETONES UA: NEGATIVE
LEUKOCYTES UA: NEGATIVE
Nitrite, UA: NEGATIVE
Protein, UA: NEGATIVE
SPEC GRAV UA: 1.025 (ref 1.005–1.030)
Urobilinogen, Ur: 1 mg/dL (ref 0.2–1.0)
pH, UA: 6 (ref 5.0–7.5)

## 2016-06-06 LAB — MICROSCOPIC EXAMINATION
BACTERIA UA: NONE SEEN
EPITHELIAL CELLS (NON RENAL): NONE SEEN /HPF (ref 0–10)
Renal Epithel, UA: NONE SEEN /hpf

## 2016-06-06 NOTE — Patient Instructions (Addendum)
lactaid before any dairy  Irritable Bowel Syndrome, Adult Irritable bowel syndrome (IBS) is not one specific disease. It is a group of symptoms that affects the organs responsible for digestion (gastrointestinal or GI tract). To regulate how your GI tract works, your body sends signals back and forth between your intestines and your brain. If you have IBS, there may be a problem with these signals. As a result, your GI tract does not function normally. Your intestines may become more sensitive and overreact to certain things. This is especially true when you eat certain foods or when you are under stress. There are four types of IBS. These may be determined based on the consistency of your stool:  IBS with diarrhea.  IBS with constipation.  Mixed IBS.  Unsubtyped IBS. It is important to know which type of IBS you have. Some treatments are more likely to be helpful for certain types of IBS. What are the causes? The exact cause of IBS is not known. What increases the risk? You may have a higher risk of IBS if:  You are a woman.  You are younger than 50 years old.  You have a family history of IBS.  You have mental health problems.  You have had bacterial infection of your GI tract. What are the signs or symptoms? Symptoms of IBS vary from person to person. The main symptom is abdominal pain or discomfort. Additional symptoms usually include one or more of the following:  Diarrhea, constipation, or both.  Abdominal swelling or bloating.  Feeling full or sick after eating a small or regular-size meal.  Frequent gas.  Mucus in the stool.  A feeling of having more stool left after a bowel movement. Symptoms tend to come and go. They may be associated with stress, psychiatric conditions, or nothing at all. How is this diagnosed? There is no specific test to diagnose IBS. Your health care provider will make a diagnosis based on a physical exam, medical history, and your symptoms.  You may have other tests to rule out other conditions that may be causing your symptoms. These may include:  Blood tests.  X-rays.  CT scan.  Endoscopy and colonoscopy. This is a test in which your GI tract is viewed with a long, thin, flexible tube. How is this treated? There is no cure for IBS, but treatment can help relieve symptoms. IBS treatment often includes:  Changes to your diet, such as:  Eating more fiber.  Avoiding foods that cause symptoms.  Drinking more water.  Eating regular, medium-sized portioned meals.  Medicines. These may include:  Fiber supplements if you have constipation.  Medicine to control diarrhea (antidiarrheal medicines).  Medicine to help control muscle spasms in your GI tract (antispasmodic medicines).  Medicines to help with any mental health issues, such as antidepressants or tranquilizers.  Therapy.  Talk therapy may help with anxiety, depression, or other mental health issues that can make IBS symptoms worse.  Stress reduction.  Managing your stress can help keep symptoms under control. Follow these instructions at home:  Take medicines only as directed by your health care provider.  Eat a healthy diet.  Avoid foods and drinks with added sugar.  Include more whole grains, fruits, and vegetables gradually into your diet. This may be especially helpful if you have IBS with constipation.  Avoid any foods and drinks that make your symptoms worse. These may include dairy products and caffeinated or carbonated drinks.  Do not eat large meals.  Drink enough  fluid to keep your urine clear or pale yellow.  Exercise regularly. Ask your health care provider for recommendations of good activities for you.  Keep all follow-up visits as directed by your health care provider. This is important. Contact a health care provider if:  You have constant pain.  You have trouble or pain with swallowing.  You have worsening diarrhea. Get  help right away if:  You have severe and worsening abdominal pain.  You have diarrhea and:  You have a rash, stiff neck, or severe headache.  You are irritable, sleepy, or difficult to awaken.  You are weak, dizzy, or extremely thirsty.  You have bright red blood in your stool or you have black tarry stools.  You have unusual abdominal swelling that is painful.  You vomit continuously.  You vomit blood (hematemesis).  You have both abdominal pain and a fever. This information is not intended to replace advice given to you by your health care provider. Make sure you discuss any questions you have with your health care provider. Document Released: 01/22/2005 Document Revised: 06/24/2015 Document Reviewed: 10/09/2013 Elsevier Interactive Patient Education  2017 [employer].

## 2016-06-08 NOTE — Progress Notes (Signed)
BP (!) 148/88   Pulse 63   Temp 97.3 F (36.3 C) (Oral)   Ht 5\' 10"  (1.778 m)   Wt 232 lb 6.4 oz (105.4 kg)   BMI 33.35 kg/m    Subjective:    Patient ID: Jerry [name], male    DOB: [birth year], 51 y.o.   MRN: [id number]  HPI: Jerry [name] is a 51 y.o. male presenting on 06/06/2016 for Hyperlipidemia (check up) and discomfort with urinating  This patient comes in for periodic recheck on medications and conditions including hematuria from his last check. He has no known kidney or bladder disease. No stone history. We have discussed that since he has had 2 episodes of microscopic hematuria we should refer him to urology for further evaluation.   Also with IBS symptoms. He has noticed some after a fatty meal but also after dairy intake. Recommended eliminating this and seeing if the episodes resolve.  Will have follow up with PCP Dr. Wendi Snipes.   He needs another script for stockings for his chronic venous stasis. 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  All medications are reviewed today. There are no reports of any problems with the medications. All of the medical conditions are reviewed and updated.  Lab work is reviewed and will be ordered as medically necessary. There are no new problems reported with today's visit.   Past Medical History:  Diagnosis Date  . Encounter for long-term (current) use of other medications   . Head injury   . Seizure ([place])   . Varicose veins   . Vasculitis ([place]) 11/2014   Relevant past medical, surgical, family and social history reviewed and updated as indicated. Interim medical history since our last visit reviewed. Allergies and medications reviewed and updated. DATA REVIEWED: CHART IN EPIC  Social History   Social History  . Marital status: Married    Spouse name: N/A  . Number of children: 0  . Years of education: 12   Occupational History  .      Not working   Social History Main Topics  . Smoking  status: Former Smoker    Packs/day: 0.50    Quit date: 10/21/2014  . Smokeless tobacco: Never Used  . Alcohol use No  . Drug use: No  . Sexual activity: Not on file   Other Topics Concern  . Not on file   Social History Narrative   Patient is currently not [place] school education some college. Patient  Is single and lives with his mother.    Right handed.   Caffeine one cup daily.    Past Surgical History:  Procedure Laterality Date  . plate on head    . TOE INTERNAL FIXATION W/ COMPRESSION SCREW      Family History  Problem Relation Age of Onset  . Diabetes Father     Review of Systems  Constitutional: Negative.  Negative for appetite change and fatigue.  HENT: Negative.   Eyes: Negative.  Negative for pain and visual disturbance.  Respiratory: Negative.  Negative for cough, chest tightness, shortness of breath and wheezing.   Cardiovascular: Negative.  Negative for chest pain, palpitations and leg swelling.  Gastrointestinal: Positive for abdominal pain and diarrhea. Negative for nausea and vomiting.  Endocrine: Negative.   Genitourinary: Positive for urgency. Negative for decreased urine volume, dysuria, enuresis, flank pain and hematuria.  Musculoskeletal: Negative.   Skin: Negative.  Negative for color change and rash.  Neurological: Negative.  Negative for weakness, numbness and headaches.  Psychiatric/Behavioral: Negative.     Allergies as of 06/06/2016   No Known Allergies     Medication List       Accurate as of 06/06/16 11:59 PM. Always use your most recent med list.          DEPAKOTE ER 500 MG 24 hr tablet Generic drug:  divalproex 3 tabs po bid   phenytoin 100 MG ER capsule Commonly known as:  DILANTIN Take 2 capsules (200 mg total) by mouth at bedtime.   simvastatin 20 MG tablet Commonly known as:  ZOCOR Take 1 tablet (20 mg total) by mouth at bedtime.            Durable Medical Equipment        Start     Ordered   06/06/16  0000  DME Other see comment    Comments:  KNEE HIGH stockings Compression 20-30 Dx; chronic venous stasis   06/06/16 0907         Objective:    BP (!) 148/88   Pulse 63   Temp 97.3 F (36.3 C) (Oral)   Ht 5\' 10"  (1.778 m)   Wt 232 lb 6.4 oz (105.4 kg)   BMI 33.35 kg/m   No Known Allergies  Wt Readings from Last 3 Encounters:  06/06/16 232 lb 6.4 oz (105.4 kg)  05/31/16 234 lb (106.1 kg)  08/22/15 232 lb (105.2 kg)    Physical Exam  Constitutional: He appears well-developed and well-nourished.  HENT:  Head: Normocephalic and atraumatic.  Eyes: Conjunctivae and EOM are normal. Pupils are equal, round, and reactive to light.  Neck: Normal range of motion. Neck supple.  Cardiovascular: Normal rate, regular rhythm and normal heart sounds.   Pulmonary/Chest: Effort normal and breath sounds normal.  Abdominal: Soft. Bowel sounds are normal.  Musculoskeletal: Normal range of motion.  Skin: Skin is warm and dry.    Results for orders placed or performed in visit on 06/06/16  Microscopic Examination  Result Value Ref Range   WBC, UA 0-5 0 - 5 /hpf   RBC, UA 3-10 (A) 0 - 2 /hpf   Epithelial Cells (non renal) None seen 0 - 10 /hpf   Renal Epithel, UA None seen None seen /hpf   Bacteria, UA None seen None seen/Few  Urinalysis, Complete  Result Value Ref Range   Specific Gravity, UA 1.025 1.005 - 1.030   pH, UA 6.0 5.0 - 7.5   Color, UA Yellow Yellow   Appearance Ur Clear Clear   Leukocytes, UA Negative Negative   Protein, UA Negative Negative/Trace   Glucose, UA Negative Negative   Ketones, UA Negative Negative   RBC, UA Trace (A) Negative   Bilirubin, UA Negative Negative   Urobilinogen, Ur 1.0 0.2 - 1.0 mg/dL   Nitrite, UA Negative Negative   Microscopic Examination See below:       Assessment & Plan:   1. Urine troubles - Urinalysis, Complete - Microscopic Examination  2. Venous stasis - DME Other see comment  3. Benign essential microscopic hematuria -  Ambulatory referral to Urology - Microscopic Examination  4. Irritable bowel syndrome with diarrhea Avoid dairy or take LActaid for dairy intake.  Continue all other maintenance medications as listed above.  Follow up plan: Return in about 2 months (around 08/06/2016) for [facility] follow up.  Educational handout given for IBS  Terald Sleeper PA-C [place], [place]  [phone]   06/08/2016, 8:44 AM

## 2016-06-14 DIAGNOSIS — M79672 Pain in left foot: Secondary | ICD-10-CM | POA: Diagnosis not present

## 2016-06-14 DIAGNOSIS — M779 Enthesopathy, unspecified: Secondary | ICD-10-CM | POA: Diagnosis not present

## 2016-06-14 DIAGNOSIS — M79671 Pain in right foot: Secondary | ICD-10-CM | POA: Diagnosis not present

## 2016-07-17 ENCOUNTER — Telehealth: Payer: Self-pay | Admitting: Nurse Practitioner

## 2016-07-17 NOTE — Telephone Encounter (Signed)
Called and spoke to [facility] for refill of D phenytoin (DILANTIN) 100 MG ER capsule RX will be delivered to the office within 7-10 business days .   Identifier number for Dilantin [id number]  Conformation number for Delivery [phone].

## 2016-07-31 ENCOUNTER — Telehealth: Payer: Self-pay | Admitting: Nurse Practitioner

## 2016-07-31 NOTE — Telephone Encounter (Signed)
Pt requesting refill of Depakote 500 ER. Best call back is [phone]

## 2016-08-01 NOTE — Telephone Encounter (Signed)
Patient's Medication will be here sometime next week dilantin not time yet for Refill Dilantin.

## 2016-08-07 ENCOUNTER — Encounter: Payer: Self-pay | Admitting: Pediatrics

## 2016-08-07 ENCOUNTER — Ambulatory Visit (INDEPENDENT_AMBULATORY_CARE_PROVIDER_SITE_OTHER): Payer: BLUE CROSS/BLUE SHIELD | Admitting: Pediatrics

## 2016-08-07 VITALS — BP 118/81 | HR 67 | Temp 97.9°F | Ht 70.0 in | Wt 224.4 lb

## 2016-08-07 DIAGNOSIS — K529 Noninfective gastroenteritis and colitis, unspecified: Secondary | ICD-10-CM | POA: Diagnosis not present

## 2016-08-07 NOTE — Patient Instructions (Signed)
Drinks lots of fluids Bland foods, small amounts next few days

## 2016-08-07 NOTE — Progress Notes (Signed)
  Subjective:   Patient ID: Jerry [name], male    DOB: Sep 01, 1965, 51 y.o.   MRN: [id number] CC: Nausea; Emesis; Diarrhea; and Fatigue  HPI: Jerry [name] is a 51 y.o. male presenting for Nausea; Emesis; Diarrhea; and Fatigue  Has had nagging abd pain/discomfort/cramp All started at once, nausea, vomiting, diarrhea 2 days ago in the morning Diarrhea almost every hour the first day Vomiting a couple times that day, with dry heaving Yesterday slightly better with diarrhea and nausea, just still feeling weak Last emesis early yesterday AM Tried peptobismal, didn't help No appetite, not eating much yesterday Not eaten any unusual foods No blood or bile in emesis/diarrhea Mother has not had similar symptoms  No recent seizures  Relevant past medical, surgical, family and social history reviewed. Allergies and medications reviewed and updated. History  Smoking Status  . Former Smoker  . Packs/day: 0.50  . Quit date: 10/21/2014  Smokeless Tobacco  . Never Used   ROS: Per HPI   Objective:    BP 118/81   Pulse 67   Temp 97.9 F (36.6 C) (Oral)   Ht 5\' 10"  (1.778 m)   Wt 224 lb 6.4 oz (101.8 kg)   BMI 32.20 kg/m   Wt Readings from Last 3 Encounters:  08/07/16 224 lb 6.4 oz (101.8 kg)  06/06/16 232 lb 6.4 oz (105.4 kg)  05/31/16 234 lb (106.1 kg)    Gen: NAD, alert, cooperative with exam, NCAT EYES: EOMI, no conjunctival injection, or no icterus ENT:  TMs obscurred by cerumen b/l, MMM LYMPH: no cervical LAD CV: NRRR, normal S1/S2, no murmur, distal pulses 2+ b/l Resp: CTABL, no wheezes, normal WOB Abd: +BS, soft, mildly uncomfortable with palpation throughout, ND, no guarding or rebound.  Ext: No edema, warm Neuro: Alert and oriented, strength equal b/l UE and LE MSK: normal muscle bulk  Assessment & Plan:  Jerry [name] was seen today for nausea, emesis, diarrhea and fatigue.  Diagnoses and all orders for this visit:  Gastroenteritis Symptoms improving Keeping  fluids down Appears well hydrated Cont symptom care Return precautions discussed Note given for work  Follow up plan: Return if symptoms worsen or fail to improve. Assunta Found, MD [place]

## 2016-08-20 ENCOUNTER — Ambulatory Visit: Payer: BLUE CROSS/BLUE SHIELD | Admitting: Family Medicine

## 2016-08-21 ENCOUNTER — Telehealth: Payer: Self-pay | Admitting: Family Medicine

## 2016-08-21 ENCOUNTER — Encounter: Payer: Self-pay | Admitting: Family Medicine

## 2016-08-21 NOTE — Telephone Encounter (Signed)
Detailed message left for patient to please call us back to reschedule.

## 2016-08-21 NOTE — Telephone Encounter (Signed)
Patient 's Depakote was delivered six bottles  500 mg [employer] patient assistance program .   Please call me when you have three weeks left on your prescription. In order for me to get medication shipped, I need to know three weeks before because prescription takes 7-10 business days for Delivery.  Thanks Rance Muir  Patient assistance program [facility]. [phone]

## 2016-10-23 ENCOUNTER — Ambulatory Visit (INDEPENDENT_AMBULATORY_CARE_PROVIDER_SITE_OTHER): Payer: BLUE CROSS/BLUE SHIELD | Admitting: Family Medicine

## 2016-10-23 ENCOUNTER — Encounter: Payer: Self-pay | Admitting: Family Medicine

## 2016-10-23 VITALS — BP 121/74 | HR 87 | Temp 102.0°F | Resp 18 | Ht 70.0 in | Wt 224.6 lb

## 2016-10-23 DIAGNOSIS — M791 Myalgia, unspecified site: Secondary | ICD-10-CM

## 2016-10-23 DIAGNOSIS — R6889 Other general symptoms and signs: Secondary | ICD-10-CM

## 2016-10-23 LAB — VERITOR FLU A/B WAIVED
Influenza A: NEGATIVE
Influenza B: NEGATIVE

## 2016-10-23 MED ORDER — OSELTAMIVIR PHOSPHATE 75 MG PO CAPS: 75.0000 mg | ORAL_CAPSULE | Freq: Two times a day (BID) | ORAL | 0 refills | Status: DC

## 2016-10-23 MED ORDER — NAPROXEN 500 MG PO TABS: 500.0000 mg | ORAL_TABLET | Freq: Two times a day (BID) | ORAL | 0 refills | Status: DC

## 2016-10-23 NOTE — Progress Notes (Addendum)
Subjective: CC: Myalgia, headache PCP: Timmothy Euler, MD [id number] S Jerry is a 51 y.o. male presenting to clinic today for:  Patient reports onset of productive cough, congestion, headache, myalgia Monday evening. He notes that this was an abrupt onset of symptoms. He reports that cough is minimally productive of clear sputum. Denies hemoptysis. No wheeze. No known sick contacts. He's been taking Tylenol and vitamin C for symptoms. He reports good oral hydration, though notes that his appetite is poor. Additionally, he reports one episode of diarrhea that was nonbloody. No vomiting or nausea. No recent travel. He denies measured fevers at home, though notes he has felt warm and had chills.  No Known Allergies Past Medical History:  Diagnosis Date  . Encounter for long-term (current) use of other medications   . Head injury   . Seizure ([place])   . Varicose veins   . Vasculitis ([place]) 11/2014   Family History  Problem Relation Age of Onset  . Diabetes Father    Social Hx: former smoker.Current medications reviewed.   ROS: Per HPI  Objective: Office vital signs reviewed. BP 121/74   Pulse 87   Temp (!) 102 F (38.9 C) (Oral)   Resp 18   Ht '5\' 10"'  (1.778 m)   Wt 224 lb 9.6 oz (101.9 kg)   SpO2 97%   BMI 32.23 kg/m   Physical Examination:  General: Awake, alert, tired, nontoxic appearing male, pale appearing, No acute distress HEENT: Normal, no TTP to sinuses    Neck: No masses palpated. No lymphadenopathy    Ears: Tympanic membranes occluded by cerumen bilaterally. No mastoid or tragal TTP to palpation.    Eyes: PERRLA, extraocular membranes intact, sclera white    Nose: nasal turbinates moist, copious clear nasal discharge    Throat: moist mucus membranes, mild o/p erythema, no tonsillar exudate.  Airway is patent Cardio: regular rate and rhythm, S1S2 heard, no murmurs appreciated Pulm: clear to auscultation bilaterally, no wheezes, rhonchi or rales; normal work of  breathing on room air  No results found for this or any previous visit (from the past 24 hour(s)).  Assessment/ Plan: 51 y.o. male   1. Flu-like symptoms Patient's symptoms very concerning for influenza. He is febrile to102F here in office.  His vital signs are otherwise stable. Rapid flu was negative. However, given his constellation of symptoms I'm concerned that this may be a false negative. He is within the 48 hour window to start treatment for influenza with Tamiflu. I prescribed him this today and reviewed medication side effects with patient. Additionally, I prescribed naproxen 500 mg for him to take twice daily with a meal for myalgia and fever. There is some evidence that this may also have some antiviral activity against influenza. I discussed this with patient. He may take Tylenol as needed intermittently for breakthrough pain or fever. I encouraged by mouth hydration and rest. Strict return precautions were reviewed with the patient. He will follow up as needed. - Veritor Flu A/B Waived - oseltamivir (TAMIFLU) 75 MG capsule; Take 1 capsule (75 mg total) by mouth 2 (two) times daily.  Dispense: 10 capsule; Refill: 0 - naproxen (NAPROSYN) 500 MG tablet; Take 1 tablet (500 mg total) by mouth 2 (two) times daily with a meal.  Dispense: 14 tablet; Refill: 0  2. Myalgia I suspect secondary to viral illness. However, if myalgia persist outside of acute illness, we'll consider CMP, CBC with differential, CK, CRP and ESR to evaluate for other pathologies. -  Veritor Flu A/B Waived   Orders Placed This Encounter  Procedures  . Veritor Flu A/B Waived    Order Specific Question:   Source    Answer:   nasal   Meds ordered this encounter  Medications  . oseltamivir (TAMIFLU) 75 MG capsule    Sig: Take 1 capsule (75 mg total) by mouth 2 (two) times daily.    Dispense:  10 capsule    Refill:  0  . naproxen (NAPROSYN) 500 MG tablet    Sig: Take 1 tablet (500 mg total) by mouth 2 (two) times  daily with a meal.    Dispense:  14 tablet    Refill:  0     Jerry Windell Moulding, DO [place] [phone]

## 2016-10-23 NOTE — Patient Instructions (Signed)
I'm concerned that you may have the flu. He had a rapid flu test done today. This was negative. Unfortunately, it is not a perfect test and can miss up to 5 out of every 100 cases of flu. Therefore, I have prescribed you Tamiflu to treat your illness.  You are also being prescribed Naproxen to take twice daily.  You may take these medications at the same time. I do recommend that you at least have a small meal on your belly when you take these medications, as they can cause nausea and GI upset. If he notices that your symptoms are not improving after completion of the Tamiflu, you are unable to stay hydrated, you have high fevers, please seek immediate medical attention.   Influenza, Adult Influenza, more commonly known as "the flu," is a viral infection that primarily affects the respiratory tract. The respiratory tract includes organs that help you breathe, such as the lungs, nose, and throat. The flu causes many common cold symptoms, as well as a high fever and body aches. The flu spreads easily from person to person (is contagious). Getting a flu shot (influenza vaccination) every year is the best way to prevent influenza. What are the causes? Influenza is caused by a virus. You can catch the virus by:  Breathing in droplets from an infected person's cough or sneeze.  Touching something that was recently contaminated with the virus and then touching your mouth, nose, or eyes.  What increases the risk? The following factors may make you more likely to get the flu:  Not cleaning your hands frequently with soap and water or alcohol-based hand sanitizer.  Having close contact with many people during cold and flu season.  Touching your mouth, eyes, or nose without washing or sanitizing your hands first.  Not drinking enough fluids or not eating a healthy diet.  Not getting enough sleep or exercise.  Being under a high amount of stress.  Not getting a yearly (annual) flu shot.  You may be  at a higher risk of complications from the flu, such as a severe lung infection (pneumonia), if you:  Are over the age of 55.  Are pregnant.  Have a weakened disease-fighting system (immune system). You may have a weakened immune system if you: ? Have HIV or AIDS. ? Are undergoing chemotherapy. ? Aretaking medicines that reduce the activity of (suppress) the immune system.  Have a long-term (chronic) illness, such as heart disease, kidney disease, diabetes, or lung disease.  Have a liver disorder.  Are obese.  Have anemia.  What are the signs or symptoms? Symptoms of this condition typically last 4-10 days and may include:  Fever.  Chills.  Headache, body aches, or muscle aches.  Sore throat.  Cough.  Runny or congested nose.  Chest discomfort and cough.  Poor appetite.  Weakness or tiredness (fatigue).  Dizziness.  Nausea or vomiting.  How is this diagnosed? This condition may be diagnosed based on your medical history and a physical exam. Your health care provider may do a nose or throat swab test to confirm the diagnosis. How is this treated? If influenza is detected early, you can be treated with antiviral medicine that can reduce the length of your illness and the severity of your symptoms. This medicine may be given by mouth (orally) or through an IV tube that is inserted in one of your veins. The goal of treatment is to relieve symptoms by taking care of yourself at home. This may include  taking over-the-counter medicines, drinking plenty of fluids, and adding humidity to the air in your home. In some cases, influenza goes away on its own. Severe influenza or complications from influenza may be treated in a hospital. Follow these instructions at home:  Take over-the-counter and prescription medicines only as told by your health care provider.  Use a cool mist humidifier to add humidity to the air in your home. This can make breathing easier.  Rest as  needed.  Drink enough fluid to keep your urine clear or pale yellow.  Cover your mouth and nose when you cough or sneeze.  Wash your hands with soap and water often, especially after you cough or sneeze. If soap and water are not available, use hand sanitizer.  Stay home from work or school as told by your health care provider. Unless you are visiting your health care provider, try to avoid leaving home until your fever has been gone for 24 hours without the use of medicine.  Keep all follow-up visits as told by your health care provider. This is important. How is this prevented?  Getting an annual flu shot is the best way to avoid getting the flu. You may get the flu shot in late summer, fall, or winter. Ask your health care provider when you should get your flu shot.  Wash your hands often or use hand sanitizer often.  Avoid contact with people who are sick during cold and flu season.  Eat a healthy diet, drink plenty of fluids, get enough sleep, and exercise regularly. Contact a health care provider if:  You develop new symptoms.  You have: ? Chest pain. ? Diarrhea. ? A fever.  Your cough gets worse.  You produce more mucus.  You feel nauseous or you vomit. Get help right away if:  You develop shortness of breath or difficulty breathing.  Your skin or nails turn a bluish color.  You have severe pain or stiffness in your neck.  You develop a sudden headache or sudden pain in your face or ear.  You cannot stop vomiting. This information is not intended to replace advice given to you by your health care provider. Make sure you discuss any questions you have with your health care provider. Document Released: 01/20/2000 Document Revised: 06/30/2015 Document Reviewed: 11/16/2014 Elsevier Interactive Patient Education  2017 [employer].

## 2016-11-01 ENCOUNTER — Encounter: Payer: Self-pay | Admitting: Family

## 2016-11-01 ENCOUNTER — Ambulatory Visit (INDEPENDENT_AMBULATORY_CARE_PROVIDER_SITE_OTHER): Payer: BLUE CROSS/BLUE SHIELD

## 2016-11-01 ENCOUNTER — Ambulatory Visit (INDEPENDENT_AMBULATORY_CARE_PROVIDER_SITE_OTHER): Payer: BLUE CROSS/BLUE SHIELD | Admitting: Family

## 2016-11-01 VITALS — BP 138/81 | HR 90 | Temp 97.9°F | Ht 70.0 in | Wt 231.6 lb

## 2016-11-01 DIAGNOSIS — M25531 Pain in right wrist: Secondary | ICD-10-CM | POA: Diagnosis not present

## 2016-11-01 DIAGNOSIS — M654 Radial styloid tenosynovitis [de Quervain]: Secondary | ICD-10-CM

## 2016-11-01 MED ORDER — PREDNISONE 10 MG (21) PO TBPK: ORAL_TABLET | ORAL | 0 refills | Status: DC

## 2016-11-01 MED ORDER — NAPROXEN 500 MG PO TABS: 500.0000 mg | ORAL_TABLET | Freq: Two times a day (BID) | ORAL | 0 refills | Status: DC

## 2016-11-01 NOTE — Progress Notes (Signed)
   Subjective:    Patient ID: Jerry [name], male    DOB: [birth year], 51 y.o.   MRN: [id number]  Wrist Pain   The pain is present in the right wrist. This is a new problem. The current episode started in the past 7 days. There has been no history of extremity trauma. The problem occurs intermittently. The problem has been waxing and waning. The quality of the pain is described as aching. The pain is at a severity of 8/10. The pain is moderate. Associated symptoms include joint swelling. Pertinent negatives include no itching, numbness or stiffness. The symptoms are aggravated by activity. He has tried rest for the symptoms. The treatment provided mild relief. Family history does not include gout or rheumatoid arthritis.      Review of Systems  Musculoskeletal: Negative for stiffness.  Skin: Negative for itching.  Neurological: Negative for numbness.  All other systems reviewed and are negative.      Objective:   Physical Exam  Constitutional: He is oriented to person, place, and time. He appears well-developed and well-nourished. No distress.  HENT:  Head: Normocephalic.  Right Ear: External ear normal.  Left Ear: External ear normal.  Mouth/Throat: Oropharynx is clear and moist.  Eyes: Pupils are equal, round, and reactive to light. Right eye exhibits no discharge. Left eye exhibits no discharge.  Neck: Normal range of motion. Neck supple. No thyromegaly present.  Cardiovascular: Normal rate, regular rhythm and intact distal pulses.   No murmur heard. Pulmonary/Chest: Effort normal and breath sounds normal. No respiratory distress. He has no wheezes.  Abdominal: Soft. Bowel sounds are normal. He exhibits no distension. There is no tenderness.  Musculoskeletal: Normal range of motion. He exhibits tenderness. He exhibits no edema.  Right swelling and tenderness in medial wrist, positive Finkelstein test  Neurological: He is alert and oriented to person, place, and time.  Skin:  Skin is warm and dry. No rash noted. No erythema.  Psychiatric: He has a normal mood and affect. His behavior is normal. Judgment and thought content normal.  Vitals reviewed.   Wrist x-ray- negative Preliminary reading by Evelina Dun, FNP [facility]   BP 138/81   Pulse 90   Temp 97.9 F (36.6 C) (Oral)   Ht 5\' 10"  (1.778 m)   Wt 231 lb 9.6 oz (105.1 kg)   BMI 33.23 kg/m      Assessment & Plan:  1. Right wrist pain - DG Wrist Complete Right; Future - naproxen (NAPROSYN) 500 MG tablet; Take 1 tablet (500 mg total) by mouth 2 (two) times daily with a meal.  Dispense: 14 tablet; Refill: 0 - predniSONE (STERAPRED UNI-PAK 21 TAB) 10 MG (21) TBPK tablet; Use as directed  Dispense: 21 tablet; Refill: 0  2. De Quervain's tenosynovitis, right Rest Wrist splint Start naprosyn with food and prednisone  Ice RTO prn - naproxen (NAPROSYN) 500 MG tablet; Take 1 tablet (500 mg total) by mouth 2 (two) times daily with a meal.  Dispense: 14 tablet; Refill: 0 - predniSONE (STERAPRED UNI-PAK 21 TAB) 10 MG (21) TBPK tablet; Use as directed  Dispense: 21 tablet; Refill: 0   Evelina Dun, FNP

## 2016-11-01 NOTE — Patient Instructions (Signed)
De Quervain Disease De Quervain disease is inflammation of the tendon on the thumb side of the wrist. Tendons are cords of tissue that connect bones to muscles. The tendons in your hand pass through a tunnel, or sheath. A slippery layer of tissue (synovium) lets the tendons move smoothly in the sheath. With de Quervain disease, the sheath swells or thickens, causing friction and pain. The condition is also called de Quervain tendinosis and de Quervain syndrome. It occurs most often in women who are 30-50 years old. What are the causes? The exact cause of de Quervain disease is not known. It may result from:  Overusing your hands, especially with repetitive motions that involve twisting your hand or using a forceful grip.  Pregnancy.  Rheumatoid disease.  What increases the risk? You may have a greater risk for de Quervain disease if you:  Are a middle-aged woman.  Are pregnant.  Have rheumatoid arthritis.  Have diabetes.  Use your hands far more than normal, especially with a tight grip or excessive twisting.  What are the signs or symptoms? Pain on the thumb side of your wrist is the main symptom of de Quervain disease. Other signs and symptoms include:  Pain that gets worse when you grasp something or turn your wrist.  Pain that extends up the forearm.  Cysts in the area of the pain.  Swelling of your wrist and hand.  A sensation of snapping in the wrist.  Trouble moving the thumb and wrist.  How is this diagnosed? Your health care provider may diagnose de Quervain disease based on your signs and symptoms. A physical exam will also be done. A simple test (Finkelstein test) that involves pulling your thumb and wrist to see if this causes pain can help determine whether you have the condition. Sometimes you may need to have an X-ray. How is this treated? Avoiding any activity that causes pain and swelling is the best treatment. Other options include:  Wearing a  splint.  Taking medicine. Anti-inflammatory medicines and corticosteroid injections may reduce inflammation and relieve pain.  Having surgery if other treatments do not work.  Follow these instructions at home:  Using ice can be helpful after doing activities that involve the sore wrist. To apply ice to the injured area: ? Put ice in a plastic bag. ? Place a towel between your skin and the bag. ? Leave the ice on for 20 minutes, 2-3 times a day.  Take medicines only as directed by your health care provider.  Wear your splint as directed. This will allow your hand to rest and heal. Contact a health care provider if:  Your pain medicine does not help.  Your pain gets worse.  You develop new symptoms. This information is not intended to replace advice given to you by your health care provider. Make sure you discuss any questions you have with your health care provider. Document Released: 10/17/2000 Document Revised: 06/30/2015 Document Reviewed: 05/27/2013 Elsevier Interactive Patient Education  2018 Elsevier Inc.  

## 2016-11-19 ENCOUNTER — Telehealth: Payer: Self-pay | Admitting: Nurse Practitioner

## 2016-11-19 DIAGNOSIS — Z23 Encounter for immunization: Secondary | ICD-10-CM | POA: Diagnosis not present

## 2016-11-19 NOTE — Telephone Encounter (Signed)
Pt request refill for DEPAKOTE ER 500 MG 24 hr tablet and phenytoin (DILANTIN) 100 MG ER capsule . Pt said he has 1 bottle left of dilantin.

## 2016-11-19 NOTE — Telephone Encounter (Signed)
Refills have been called in for Dilantin and Depakote Delivery  7-10 days . Patient aware.

## 2016-11-20 NOTE — Telephone Encounter (Signed)
Pt said he is returning [place] call about a paper he needs to complete. Please call

## 2016-11-20 NOTE — Telephone Encounter (Signed)
Called and spoke to patient and relayed it will be time for him to renew in December proof of income will have to be submitted . For his Dilantin and his Depakote. [employer] and [employer].

## 2016-11-22 ENCOUNTER — Telehealth: Payer: Self-pay | Admitting: Nurse Practitioner

## 2016-11-22 NOTE — Telephone Encounter (Signed)
Called and spoke to Patient's mom and relayed to her that patient assistance Depakote ER 500 mg from [employer] was ready.  6 bottles.   Please call me when you have three weeks left on your prescription. In order for me to get medication shipped, I need to know three weeks before because prescription takes 7-10 business days for Delivery.  Thanks Rance Muir  Patient assistance program [facility]. [phone]

## 2016-11-27 ENCOUNTER — Telehealth: Payer: Self-pay | Admitting: Nurse Practitioner

## 2016-11-27 NOTE — Telephone Encounter (Signed)
Called and spoke to Patient's mother two bottles of Dilantin 100 mg. Placed RX  at the front desk .

## 2016-11-30 ENCOUNTER — Ambulatory Visit (INDEPENDENT_AMBULATORY_CARE_PROVIDER_SITE_OTHER): Payer: BLUE CROSS/BLUE SHIELD | Admitting: Family Medicine

## 2016-11-30 ENCOUNTER — Encounter: Payer: Self-pay | Admitting: Family Medicine

## 2016-11-30 VITALS — BP 131/83 | HR 72 | Temp 97.8°F | Ht 70.0 in | Wt 231.6 lb

## 2016-11-30 DIAGNOSIS — L989 Disorder of the skin and subcutaneous tissue, unspecified: Secondary | ICD-10-CM

## 2016-11-30 DIAGNOSIS — R300 Dysuria: Secondary | ICD-10-CM

## 2016-11-30 LAB — URINALYSIS, COMPLETE
BILIRUBIN UA: NEGATIVE
GLUCOSE, UA: NEGATIVE
Leukocytes, UA: NEGATIVE
Nitrite, UA: NEGATIVE
PROTEIN UA: NEGATIVE
SPEC GRAV UA: 1.025 (ref 1.005–1.030)
UUROB: 0.2 mg/dL (ref 0.2–1.0)
pH, UA: 5.5 (ref 5.0–7.5)

## 2016-11-30 LAB — MICROSCOPIC EXAMINATION
Bacteria, UA: NONE SEEN
Epithelial Cells (non renal): NONE SEEN /hpf (ref 0–10)
RENAL EPITHEL UA: NONE SEEN /HPF
WBC UA: NONE SEEN /HPF (ref 0–?)

## 2016-11-30 NOTE — Progress Notes (Signed)
   HPI  Patient presents today return for UTI.  Patient explains he has had dysuria and increased urinary frequency for about 3 days, this actually improved over the last 2 days.  He has a history of microscopic hematuria states that he did not follow this up. 36 ounces a day over the last few days.  He is also treated with steroids recently for de Quervain's tenosynovitis, this has improved.  This was about 4 weeks ago.  Not sexually active.  He is not worried about sexually transmitted infection.  Skin lesion noted on 06/2015 continued to scale, does not seem to be getting any worse to the patient.  No itching or pain.  PMH: Smoking status noted ROS: Per HPI  Objective: BP 131/83   Pulse 72   Temp 97.8 F (36.6 C) (Oral)   Ht 5\' 10"  (1.778 m)   Wt 231 lb 9.6 oz (105.1 kg)   BMI 33.23 kg/m  Gen: NAD, alert, cooperative with exam HEENT: NCAT CV: RRR, good S1/S2, no murmur Resp: CTABL, no wheezes, non-labored Abd: SNTND, BS present, no guarding or organomegaly no suprapubic tenderness, no CVA tenderness Ext: No edema, warm Neuro: Alert and oriented, No gross deficits  And lesions slightly irregular pink to salmon colored scaly lesion on the right calf measuring 1.6 x 2.2 mm.  Assessment and plan:  #Dysuria Urinalysis is reassuring, microscopic exam is pending, if persistent microscopic hematuria will recommend urology of likely concentrated urine given recent increase in high glucose beverages.  #Skin lesion Patient with enlarging skin lesion, previously measured about 18 months ago. Recommended biopsy, he will return for this.   Orders Placed This Encounter  Procedures  . Urine Culture  . Microscopic Examination  . Urinalysis, Complete    Laroy Apple, MD [facility] 11/30/2016, 11:11 AM

## 2016-11-30 NOTE — Patient Instructions (Signed)
Great to see you!  Come back at your convenience for a biopsy.

## 2016-12-02 LAB — URINE CULTURE

## 2016-12-04 ENCOUNTER — Other Ambulatory Visit: Payer: Self-pay | Admitting: Family Medicine

## 2016-12-04 MED ORDER — CIPROFLOXACIN HCL 250 MG PO TABS: 250.0000 mg | ORAL_TABLET | Freq: Two times a day (BID) | ORAL | 0 refills | Status: DC

## 2016-12-11 ENCOUNTER — Telehealth: Payer: Self-pay | Admitting: Nurse Practitioner

## 2016-12-11 NOTE — Telephone Encounter (Signed)
Called patient and left him  a message . Relayed for patient to call me back. It's time for Patient Jerry [name]  ID # [id number]  Telephone [phone]  [facility] # [id number] Telephone [phone].  I have all his paper work ready. I just need his Proof of income and for patient to sign both applications.  Nurse to print RX' s. Marland Kitchen

## 2016-12-20 ENCOUNTER — Ambulatory Visit: Payer: BLUE CROSS/BLUE SHIELD | Admitting: Family Medicine

## 2016-12-20 ENCOUNTER — Encounter: Payer: Self-pay | Admitting: Family Medicine

## 2016-12-20 VITALS — BP 149/76 | HR 70 | Temp 97.6°F | Ht 70.0 in | Wt 229.4 lb

## 2016-12-20 DIAGNOSIS — L989 Disorder of the skin and subcutaneous tissue, unspecified: Secondary | ICD-10-CM | POA: Diagnosis not present

## 2016-12-20 DIAGNOSIS — D0471 Carcinoma in situ of skin of right lower limb, including hip: Secondary | ICD-10-CM | POA: Diagnosis not present

## 2016-12-20 DIAGNOSIS — H6123 Impacted cerumen, bilateral: Secondary | ICD-10-CM | POA: Diagnosis not present

## 2016-12-20 DIAGNOSIS — H9313 Tinnitus, bilateral: Secondary | ICD-10-CM | POA: Diagnosis not present

## 2016-12-20 NOTE — Progress Notes (Signed)
   HPI  Patient presents today here for biopsy and tinnitus   Patient explains that he has had had this lesion for over 1 years, at times it is itchy, last visit it had enlarged.  He has returned for biopsy. No bleeding or rapid change of the lesion.  Ear ringing-has been going on worse for the last few months, wonders if his ears need to be cleaned out.  PMH: Smoking status noted ROS: Per HPI  Objective: BP (!) 149/76   Pulse 70   Temp 97.6 F (36.4 C) (Oral)   Ht 5\' 10"  (1.778 m)   Wt 229 lb 6.4 oz (104.1 kg)   BMI 32.92 kg/m  Gen: NAD, alert, cooperative with exam HEENT: NCAT, cerumen impaction bilaterally, after ear irrigation and manual removal with curette on the left patient has clearly visible TMs with mild erythema around 11 to 1:00. CV: RRR, good S1/S2, no murmur Resp: CTABL, no wheezes, non-labored Ext: No edema, warm Neuro: Alert and oriented, No gross deficits Skin Irregular pink to salmon colored scaly lesion on the right medial calf with sharp borders, correction to previous measurement-cm not millimeters   Biopsy Skin lesion on right medial leg was biopsied today  Analgesia achieved after cleaning with alcohol prep pad using 1 mL of 1% lidocaine with epinephrine. Using a 3 mm punch a sample was removed at 6:00 on the lesion.  Using forceps and scissors the sample was removed.  Pressure was applied and bleeding was minimal.  Mupirocin ointment and bandage applied.  Patient tolerated well  Assessment and plan:  #Skin lesion Persistent skin lesion enlarging, biopsy today Discussed supportive care for wound care.   #Tinnitus, cerumen impaction Tinitus possibly related to cerumen impaction, cleaned out today some improvement of ear ringing. Patient experienced some dizziness with ear irrigation, he sat for about 10 more minutes and felt much better.   Laroy Apple, MD [facility] 12/20/2016, 9:26 AM

## 2016-12-20 NOTE — Patient Instructions (Signed)
Great to see you!  It will take 5-10 days to heal from the biopsy. Do not soak in a  Tub, go swimming, or use a hot tub until the lesion is healed. It is ok to shower and pat dry afterward. Keep a clean bandage in place.  Pathology results are back in about 1 week.

## 2016-12-20 NOTE — Addendum Note (Signed)
Addended by: Nigel Berthold C on: 12/20/2016 09:59 AM   Modules accepted: Orders

## 2016-12-24 ENCOUNTER — Other Ambulatory Visit: Payer: Self-pay | Admitting: Nurse Practitioner

## 2016-12-24 ENCOUNTER — Telehealth: Payer: Self-pay | Admitting: Family Medicine

## 2016-12-24 LAB — PATHOLOGY

## 2016-12-24 MED ORDER — PHENYTOIN SODIUM EXTENDED 100 MG PO CAPS: 200.0000 mg | ORAL_CAPSULE | Freq: Every day | ORAL | 3 refills | Status: DC

## 2016-12-24 MED ORDER — DEPAKOTE ER 500 MG PO TB24: ORAL_TABLET | ORAL | 3 refills | Status: DC

## 2016-12-24 NOTE — Telephone Encounter (Signed)
No answer -Left voicemail "calling to review recent lab work".  Laroy Apple, MD [facility] 12/24/2016, 5:22 PM

## 2016-12-25 ENCOUNTER — Telehealth: Payer: Self-pay | Admitting: Family Medicine

## 2016-12-25 DIAGNOSIS — D0471 Carcinoma in situ of skin of right lower limb, including hip: Secondary | ICD-10-CM

## 2016-12-25 NOTE — Telephone Encounter (Signed)
Please do

## 2016-12-25 NOTE — Telephone Encounter (Signed)
Dr. Wendi Snipes, you had tried to call this patient with the results of his skin biopsy yesterday.

## 2016-12-25 NOTE — Telephone Encounter (Signed)
[employer] Patient assistance is processing for 2019. Telephone [phone] - [phone] Dilantin . Approval will be a 7-10 day process turn around time . Patient is aware. ID # [id number] .

## 2016-12-25 NOTE — Telephone Encounter (Signed)
Patient assistance processing for [employer] patient assistance for Depakote ER 500 MG. Telephone [phone] -fax [phone] . 7-10 processing time. 2019 renewal.

## 2016-12-25 NOTE — Telephone Encounter (Signed)
Spoke to his mother per ROI.   Refer to  dermatology with squamous cell in situ of the skin  Laroy Apple, MD [facility] 12/25/2016, 12:00 PM

## 2017-02-06 ENCOUNTER — Telehealth: Payer: Self-pay | Admitting: Family Medicine

## 2017-02-06 NOTE — Telephone Encounter (Signed)
Aware.  No referral necessary.

## 2017-02-14 DIAGNOSIS — C44729 Squamous cell carcinoma of skin of left lower limb, including hip: Secondary | ICD-10-CM | POA: Diagnosis not present

## 2017-02-18 ENCOUNTER — Ambulatory Visit: Payer: BLUE CROSS/BLUE SHIELD | Admitting: Family Medicine

## 2017-02-19 DIAGNOSIS — M79673 Pain in unspecified foot: Secondary | ICD-10-CM | POA: Diagnosis not present

## 2017-02-19 DIAGNOSIS — M722 Plantar fascial fibromatosis: Secondary | ICD-10-CM | POA: Diagnosis not present

## 2017-02-20 ENCOUNTER — Telehealth: Payer: Self-pay | Admitting: Nurse Practitioner

## 2017-02-20 NOTE — Telephone Encounter (Signed)
Patient was denied from [place] patient assistance program for his Dilantin he was over the income. Patient is aware and Patient will need need His Dilantin script called into [facility].   Patient is on his last bottle of Medication . Thanks Hinton Dyer.

## 2017-03-04 DIAGNOSIS — M9904 Segmental and somatic dysfunction of sacral region: Secondary | ICD-10-CM | POA: Diagnosis not present

## 2017-03-04 DIAGNOSIS — M62830 Muscle spasm of back: Secondary | ICD-10-CM | POA: Diagnosis not present

## 2017-03-04 DIAGNOSIS — M9902 Segmental and somatic dysfunction of thoracic region: Secondary | ICD-10-CM | POA: Diagnosis not present

## 2017-03-04 DIAGNOSIS — M9903 Segmental and somatic dysfunction of lumbar region: Secondary | ICD-10-CM | POA: Diagnosis not present

## 2017-03-05 DIAGNOSIS — M9904 Segmental and somatic dysfunction of sacral region: Secondary | ICD-10-CM | POA: Diagnosis not present

## 2017-03-05 DIAGNOSIS — M9902 Segmental and somatic dysfunction of thoracic region: Secondary | ICD-10-CM | POA: Diagnosis not present

## 2017-03-05 DIAGNOSIS — M62830 Muscle spasm of back: Secondary | ICD-10-CM | POA: Diagnosis not present

## 2017-03-05 DIAGNOSIS — M9903 Segmental and somatic dysfunction of lumbar region: Secondary | ICD-10-CM | POA: Diagnosis not present

## 2017-03-07 DIAGNOSIS — M9903 Segmental and somatic dysfunction of lumbar region: Secondary | ICD-10-CM | POA: Diagnosis not present

## 2017-03-07 DIAGNOSIS — M9902 Segmental and somatic dysfunction of thoracic region: Secondary | ICD-10-CM | POA: Diagnosis not present

## 2017-03-07 DIAGNOSIS — M62830 Muscle spasm of back: Secondary | ICD-10-CM | POA: Diagnosis not present

## 2017-03-07 DIAGNOSIS — M9904 Segmental and somatic dysfunction of sacral region: Secondary | ICD-10-CM | POA: Diagnosis not present

## 2017-03-07 NOTE — Telephone Encounter (Signed)
Pt has called and is asking for a returned call from [facility]

## 2017-03-11 NOTE — Telephone Encounter (Signed)
Pt is requesting a call back to discuss when his medication will be in contact at [phone]

## 2017-03-12 DIAGNOSIS — M9902 Segmental and somatic dysfunction of thoracic region: Secondary | ICD-10-CM | POA: Diagnosis not present

## 2017-03-12 DIAGNOSIS — M62830 Muscle spasm of back: Secondary | ICD-10-CM | POA: Diagnosis not present

## 2017-03-12 DIAGNOSIS — M9904 Segmental and somatic dysfunction of sacral region: Secondary | ICD-10-CM | POA: Diagnosis not present

## 2017-03-12 DIAGNOSIS — M9903 Segmental and somatic dysfunction of lumbar region: Secondary | ICD-10-CM | POA: Diagnosis not present

## 2017-03-12 MED ORDER — PHENYTOIN SODIUM EXTENDED 100 MG PO CAPS: 200.0000 mg | ORAL_CAPSULE | Freq: Every day | ORAL | 0 refills | Status: DC

## 2017-03-12 NOTE — Addendum Note (Signed)
Addended by: Brandon Melnick on: 03/12/2017 10:49 AM   Modules accepted: Orders

## 2017-03-12 NOTE — Telephone Encounter (Signed)
Dilantin 90 day supply given, needs f/u last seen 05/2016.  No longer pap pt.  [facility] in [place].

## 2017-03-12 NOTE — Addendum Note (Signed)
Addended by: Brandon Melnick on: 03/12/2017 09:42 AM   Modules accepted: Orders

## 2017-03-12 NOTE — Telephone Encounter (Signed)
Patient needs his Dilantin script called [facility] please he will not be getting thru patient assistance anymore. Thanks Hinton Dyer

## 2017-03-15 ENCOUNTER — Telehealth: Payer: Self-pay | Admitting: Nurse Practitioner

## 2017-03-15 MED ORDER — DEPAKOTE ER 500 MG PO TB24: ORAL_TABLET | ORAL | 0 refills | Status: DC

## 2017-03-15 MED ORDER — PHENYTOIN SODIUM EXTENDED 100 MG PO CAPS: 200.0000 mg | ORAL_CAPSULE | Freq: Every day | ORAL | 0 refills | Status: DC

## 2017-03-15 NOTE — Telephone Encounter (Addendum)
Depakote refilled x 3 months. Patient needs to schedule yearly follow up. Called patient and LVM advising him of refill sent for 3 months of Depakote. Advised he needs to call Mon to schedule yearly FU. Advised him NP has several openings on May 2nd. Left number and informed him the office is now closed.

## 2017-03-15 NOTE — Addendum Note (Signed)
Addended by: Minna Antis on: 03/15/2017 12:39 PM   Modules accepted: Orders

## 2017-03-15 NOTE — Telephone Encounter (Signed)
Pt calling back wanting a call back to discuss his medication depakote. Pt didn't want to go into detail with me

## 2017-03-15 NOTE — Telephone Encounter (Signed)
Patient has been denied for  Patient assistance. DEPAKOTE ER 500 MG 24 hr tablet because he has private insurance now . Please call in script to his pharmacy .  Thanks Hinton Dyer.

## 2017-03-15 NOTE — Telephone Encounter (Signed)
Depakote refilled to [facility], [place]. patient needs follow up; note sent to pharmacy.

## 2017-03-15 NOTE — Telephone Encounter (Signed)
Spoke to Patient call  Depakote into [facility].

## 2017-03-15 NOTE — Addendum Note (Signed)
Addended by: Minna Antis on: 03/15/2017 12:49 PM   Modules accepted: Orders

## 2017-03-18 NOTE — Telephone Encounter (Signed)
Needs 05/2017 appt

## 2017-03-18 NOTE — Telephone Encounter (Signed)
Busy phone

## 2017-03-19 ENCOUNTER — Ambulatory Visit: Payer: BLUE CROSS/BLUE SHIELD | Admitting: Family Medicine

## 2017-03-19 ENCOUNTER — Encounter: Payer: Self-pay | Admitting: Family Medicine

## 2017-03-19 VITALS — BP 133/76 | HR 71 | Temp 97.4°F | Ht 70.0 in | Wt 229.2 lb

## 2017-03-19 DIAGNOSIS — R569 Unspecified convulsions: Secondary | ICD-10-CM

## 2017-03-19 DIAGNOSIS — M9902 Segmental and somatic dysfunction of thoracic region: Secondary | ICD-10-CM | POA: Diagnosis not present

## 2017-03-19 DIAGNOSIS — S8991XA Unspecified injury of right lower leg, initial encounter: Secondary | ICD-10-CM

## 2017-03-19 DIAGNOSIS — E785 Hyperlipidemia, unspecified: Secondary | ICD-10-CM | POA: Diagnosis not present

## 2017-03-19 DIAGNOSIS — M9904 Segmental and somatic dysfunction of sacral region: Secondary | ICD-10-CM | POA: Diagnosis not present

## 2017-03-19 DIAGNOSIS — M9903 Segmental and somatic dysfunction of lumbar region: Secondary | ICD-10-CM | POA: Diagnosis not present

## 2017-03-19 DIAGNOSIS — M62830 Muscle spasm of back: Secondary | ICD-10-CM | POA: Diagnosis not present

## 2017-03-19 NOTE — Telephone Encounter (Signed)
LMVM for pt that I saved appt 06-11-17 at 1245 with CM/NP for pt to have yrly appt,  Please call back to confirm if ok.

## 2017-03-19 NOTE — Patient Instructions (Signed)
Great to see you!   

## 2017-03-19 NOTE — Progress Notes (Signed)
   HPI  Patient presents today for follow-up chronic medical problems and a leg injury.  Patient states he was at work on Friday night, 03/15/2017 when he slipped in some oil falling and hitting his right shin on a metal basket full of copper.  He states that it look like red rash at the time and hurt with any touch.  Since that time he is developed a large "goose egg" over the area and he did report this to work  Seizures Treated with 1500 mg of Depakote twice daily, good compliance, no recent seizures.  Hyperlipidemia Tolerated simvastatin without problems.  States that he does not know why he quit. Not watching his diet Fasting today  PMH: Smoking status noted ROS: Per HPI  Objective: BP 133/76   Pulse 71   Temp (!) 97.4 F (36.3 C) (Oral)   Ht '5\' 10"'$  (1.778 m)   Wt 229 lb 3.2 oz (104 kg)   BMI 32.89 kg/m  Gen: NAD, alert, cooperative with exam HEENT: NCAT CV: RRR, good S1/S2, no murmur Resp: CTABL, no wheezes, non-labored Abd: SNTND, BS present, no guarding or organomegaly Ext: No edema, warm Neuro: Alert and oriented, No gross deficits MSK Right lower extremity with large hematoma that is tender to palpation approximately 3 cm in diameter on the medial distal right lower extremity  Assessment and plan:  #Leg injury Likely severe hematoma, discussed usual course of illness Note written for work   #Seizures Well controlled, managed by neurology No changes  #Hyperlipidemia Previously elevated, tolerated simvastatin well Repeat labs today, may restart statin  I recommended colonoscopy today, he will consider    Orders Placed This Encounter  Procedures  . CMP14+EGFR  . CBC with Differential/Platelet  . Lipid panel  . TSH     Laroy Apple, MD [facility] 03/19/2017, 9:06 AM

## 2017-03-20 ENCOUNTER — Encounter: Payer: Self-pay | Admitting: *Deleted

## 2017-03-20 LAB — CMP14+EGFR
A/G RATIO: 1.7 (ref 1.2–2.2)
ALK PHOS: 90 IU/L (ref 39–117)
ALT: 27 IU/L (ref 0–44)
AST: 23 IU/L (ref 0–40)
Albumin: 4.5 g/dL (ref 3.5–5.5)
BILIRUBIN TOTAL: 0.3 mg/dL (ref 0.0–1.2)
BUN/Creatinine Ratio: 9 (ref 9–20)
BUN: 9 mg/dL (ref 6–24)
CHLORIDE: 103 mmol/L (ref 96–106)
CO2: 23 mmol/L (ref 20–29)
Calcium: 9.4 mg/dL (ref 8.7–10.2)
Creatinine, Ser: 0.95 mg/dL (ref 0.76–1.27)
GFR calc Af Amer: 107 mL/min/{1.73_m2} (ref >59)
GFR calc non Af Amer: 93 mL/min/{1.73_m2} (ref >59)
GLUCOSE: 79 mg/dL (ref 65–99)
Globulin, Total: 2.7 g/dL (ref 1.5–4.5)
POTASSIUM: 4.4 mmol/L (ref 3.5–5.2)
Sodium: 143 mmol/L (ref 134–144)
Total Protein: 7.2 g/dL (ref 6.0–8.5)

## 2017-03-20 LAB — LIPID PANEL
CHOLESTEROL TOTAL: 263 mg/dL — AB (ref 100–199)
Chol/HDL Ratio: 6.9 ratio — ABNORMAL HIGH (ref 0.0–5.0)
HDL: 38 mg/dL — ABNORMAL LOW (ref >39)
LDL CALC: 168 mg/dL — AB (ref 0–99)
TRIGLYCERIDES: 286 mg/dL — AB (ref 0–149)
VLDL Cholesterol Cal: 57 mg/dL — ABNORMAL HIGH (ref 5–40)

## 2017-03-20 LAB — CBC WITH DIFFERENTIAL/PLATELET
BASOS ABS: 0.1 10*3/uL (ref 0.0–0.2)
Basos: 1 %
EOS (ABSOLUTE): 0.1 10*3/uL (ref 0.0–0.4)
Eos: 2 %
HEMOGLOBIN: 14.8 g/dL (ref 13.0–17.7)
Hematocrit: 43.5 % (ref 37.5–51.0)
Immature Grans (Abs): 0.2 10*3/uL — ABNORMAL HIGH (ref 0.0–0.1)
Immature Granulocytes: 2 %
LYMPHS: 28 %
Lymphocytes Absolute: 2.4 10*3/uL (ref 0.7–3.1)
MCH: 33.3 pg — ABNORMAL HIGH (ref 26.6–33.0)
MCHC: 34 g/dL (ref 31.5–35.7)
MCV: 98 fL — ABNORMAL HIGH (ref 79–97)
MONOCYTES: 10 %
Monocytes Absolute: 0.9 10*3/uL (ref 0.1–0.9)
Neutrophils Absolute: 5 10*3/uL (ref 1.4–7.0)
Neutrophils: 57 %
PLATELETS: 268 10*3/uL (ref 150–379)
RBC: 4.45 x10E6/uL (ref 4.14–5.80)
RDW: 13.7 % (ref 12.3–15.4)
WBC: 8.7 10*3/uL (ref 3.4–10.8)

## 2017-03-20 LAB — TSH: TSH: 2.07 u[IU]/mL (ref 0.450–4.500)

## 2017-03-20 NOTE — Telephone Encounter (Signed)
I mailed letter with appt time and date.   06-11-17 at 1245.

## 2017-03-21 ENCOUNTER — Telehealth: Payer: Self-pay | Admitting: Family Medicine

## 2017-03-21 MED ORDER — PRAVASTATIN SODIUM 40 MG PO TABS: 40.0000 mg | ORAL_TABLET | Freq: Every day | ORAL | 0 refills | Status: DC

## 2017-03-21 NOTE — Telephone Encounter (Signed)
Pt aware of lab results 

## 2017-03-21 NOTE — Addendum Note (Signed)
Addended byCarrolyn Leigh on: 03/21/2017 02:55 PM   Modules accepted: Orders

## 2017-03-21 NOTE — Progress Notes (Signed)
Pt aware, he will take pravastatin. Sent 90 day supply to [facility]

## 2017-03-25 NOTE — Telephone Encounter (Signed)
Pt returned RN's call. He is unable to come 5/7, appt has been r/s to 5/9  [facility]

## 2017-03-28 DIAGNOSIS — M9904 Segmental and somatic dysfunction of sacral region: Secondary | ICD-10-CM | POA: Diagnosis not present

## 2017-03-28 DIAGNOSIS — M62830 Muscle spasm of back: Secondary | ICD-10-CM | POA: Diagnosis not present

## 2017-03-28 DIAGNOSIS — M9902 Segmental and somatic dysfunction of thoracic region: Secondary | ICD-10-CM | POA: Diagnosis not present

## 2017-03-28 DIAGNOSIS — M9903 Segmental and somatic dysfunction of lumbar region: Secondary | ICD-10-CM | POA: Diagnosis not present

## 2017-04-03 DIAGNOSIS — M9903 Segmental and somatic dysfunction of lumbar region: Secondary | ICD-10-CM | POA: Diagnosis not present

## 2017-04-03 DIAGNOSIS — M9904 Segmental and somatic dysfunction of sacral region: Secondary | ICD-10-CM | POA: Diagnosis not present

## 2017-04-03 DIAGNOSIS — M9902 Segmental and somatic dysfunction of thoracic region: Secondary | ICD-10-CM | POA: Diagnosis not present

## 2017-04-03 DIAGNOSIS — M62830 Muscle spasm of back: Secondary | ICD-10-CM | POA: Diagnosis not present

## 2017-04-08 DIAGNOSIS — M9904 Segmental and somatic dysfunction of sacral region: Secondary | ICD-10-CM | POA: Diagnosis not present

## 2017-04-08 DIAGNOSIS — M9902 Segmental and somatic dysfunction of thoracic region: Secondary | ICD-10-CM | POA: Diagnosis not present

## 2017-04-08 DIAGNOSIS — M9903 Segmental and somatic dysfunction of lumbar region: Secondary | ICD-10-CM | POA: Diagnosis not present

## 2017-04-08 DIAGNOSIS — M62830 Muscle spasm of back: Secondary | ICD-10-CM | POA: Diagnosis not present

## 2017-04-30 ENCOUNTER — Encounter: Payer: Self-pay | Admitting: Family Medicine

## 2017-04-30 ENCOUNTER — Ambulatory Visit (INDEPENDENT_AMBULATORY_CARE_PROVIDER_SITE_OTHER): Payer: BLUE CROSS/BLUE SHIELD | Admitting: Family Medicine

## 2017-04-30 ENCOUNTER — Encounter (INDEPENDENT_AMBULATORY_CARE_PROVIDER_SITE_OTHER): Payer: Self-pay | Admitting: *Deleted

## 2017-04-30 VITALS — BP 139/80 | HR 67 | Temp 98.2°F | Ht 70.0 in | Wt 230.0 lb

## 2017-04-30 DIAGNOSIS — E669 Obesity, unspecified: Secondary | ICD-10-CM

## 2017-04-30 DIAGNOSIS — E785 Hyperlipidemia, unspecified: Secondary | ICD-10-CM | POA: Diagnosis not present

## 2017-04-30 DIAGNOSIS — Z0000 Encounter for general adult medical examination without abnormal findings: Secondary | ICD-10-CM

## 2017-04-30 NOTE — Patient Instructions (Signed)
Great to see you!  They will call with an appointment to arrange a colonoscopy and to see a nutritionist.   Call your insurance company to see how much TDaP would cost.    Health Maintenance, Male A healthy lifestyle and preventive care is important for your health and wellness. Ask your health care provider about what schedule of regular examinations is right for you. What should I know about weight and diet? Eat a Healthy Diet  Eat plenty of vegetables, fruits, whole grains, low-fat dairy products, and lean protein.  Do not eat a lot of foods high in solid fats, added sugars, or salt.  Maintain a Healthy Weight Regular exercise can help you achieve or maintain a healthy weight. You should:  Do at least 150 minutes of exercise each week. The exercise should increase your heart rate and make you sweat (moderate-intensity exercise).  Do strength-training exercises at least twice a week.  Watch Your Levels of Cholesterol and Blood Lipids  Have your blood tested for lipids and cholesterol every 5 years starting at 51 years of age. If you are at high risk for heart disease, you should start having your blood tested when you are 51 years old. You may need to have your cholesterol levels checked more often if: ? Your lipid or cholesterol levels are high. ? You are older than 51 years of age. ? You are at high risk for heart disease.  What should I know about cancer screening? Many types of cancers can be detected early and may often be prevented. Lung Cancer  You should be screened every year for lung cancer if: ? You are a current smoker who has smoked for at least 30 years. ? You are a former smoker who has quit within the past 15 years.  Talk to your health care provider about your screening options, when you should start screening, and how often you should be screened.  Colorectal Cancer  Routine colorectal cancer screening usually begins at 51 years of age and should be  repeated every 5-10 years until you are 51 years old. You may need to be screened more often if early forms of precancerous polyps or small growths are found. Your health care provider may recommend screening at an earlier age if you have risk factors for colon cancer.  Your health care provider may recommend using home test kits to check for hidden blood in the stool.  A small camera at the end of a tube can be used to examine your colon (sigmoidoscopy or colonoscopy). This checks for the earliest forms of colorectal cancer.  Prostate and Testicular Cancer  Depending on your age and overall health, your health care provider may do certain tests to screen for prostate and testicular cancer.  Talk to your health care provider about any symptoms or concerns you have about testicular or prostate cancer.  Skin Cancer  Check your skin from head to toe regularly.  Tell your health care provider about any new moles or changes in moles, especially if: ? There is a change in a mole's size, shape, or color. ? You have a mole that is larger than a pencil eraser.  Always use sunscreen. Apply sunscreen liberally and repeat throughout the day.  Protect yourself by wearing long sleeves, pants, a wide-brimmed hat, and sunglasses when outside.  What should I know about heart disease, diabetes, and high blood pressure?  If you are 21-41 years of age, have your blood pressure checked every 3-5  years. If you are 92 years of age or older, have your blood pressure checked every year. You should have your blood pressure measured twice-once when you are at a hospital or clinic, and once when you are not at a hospital or clinic. Record the average of the two measurements. To check your blood pressure when you are not at a hospital or clinic, you can use: ? An automated blood pressure machine at a pharmacy. ? A home blood pressure monitor.  Talk to your health care provider about your target blood  pressure.  If you are between 67-68 years old, ask your health care provider if you should take aspirin to prevent heart disease.  Have regular diabetes screenings by checking your fasting blood sugar level. ? If you are at a normal weight and have a low risk for diabetes, have this test once every three years after the age of 66. ? If you are overweight and have a high risk for diabetes, consider being tested at a younger age or more often.  A one-time screening for abdominal aortic aneurysm (AAA) by ultrasound is recommended for men aged 69-75 years who are current or former smokers. What should I know about preventing infection? Hepatitis B If you have a higher risk for hepatitis B, you should be screened for this virus. Talk with your health care provider to find out if you are at risk for hepatitis B infection. Hepatitis C Blood testing is recommended for:  Everyone born from [birth year] through 1964.  Anyone with known risk factors for hepatitis C.  Sexually Transmitted Diseases (STDs)  You should be screened each year for STDs including gonorrhea and chlamydia if: ? You are sexually active and are younger than 51 years of age. ? You are older than 51 years of age and your health care provider tells you that you are at risk for this type of infection. ? Your sexual activity has changed since you were last screened and you are at an increased risk for chlamydia or gonorrhea. Ask your health care provider if you are at risk.  Talk with your health care provider about whether you are at high risk of being infected with HIV. Your health care provider may recommend a prescription medicine to help prevent HIV infection.  What else can I do?  Schedule regular health, dental, and eye exams.  Stay current with your vaccines (immunizations).  Do not use any tobacco products, such as cigarettes, chewing tobacco, and e-cigarettes. If you need help quitting, ask your health care  provider.  Limit alcohol intake to no more than 2 drinks per day. One drink equals 12 ounces of beer, 5 ounces of wine, or 1 ounces of hard liquor.  Do not use street drugs.  Do not share needles.  Ask your health care provider for help if you need support or information about quitting drugs.  Tell your health care provider if you often feel depressed.  Tell your health care provider if you have ever been abused or do not feel safe at home. This information is not intended to replace advice given to you by your health care provider. Make sure you discuss any questions you have with your health care provider. Document Released: 07/21/2007 Document Revised: 09/21/2015 Document Reviewed: 10/26/2014 Elsevier Interactive Patient Education  [employer].

## 2017-04-30 NOTE — Progress Notes (Signed)
   HPI  Patient presents today for an annual physical exam.  Patient was recently started on pravastatin for hyperlipidemia, he is tolerating it well.  He is watching his diet moderately, he is interested in losing weight and requesting a nutritionist. He is active at work but has no formal exercise routine.  Patient feels well and has no complaints otherwise. He has noticed an unusual rash on bilateral arms that starts in the first night he returns back to work.  He wears long sleeves under his equipment and states that he has not used any new lotions or soaps. It resolved by the second night  PMH: Smoking status noted ROS: Per HPI  Objective: BP 139/80   Pulse 67   Temp 98.2 F (36.8 C) (Oral)   Ht 5\' 10"  (1.778 m)   Wt 230 lb (104.3 kg)   BMI 33.00 kg/m  Gen: NAD, alert, cooperative with exam HEENT: NCAT, EOMI, PERRL, well-healed scar on head CV: RRR, good S1/S2, no murmur Resp: CTABL, no wheezes, non-labored Abd: SNTND, BS present, no guarding or organomegaly Ext: No edema, warm Neuro: Alert and oriented, No gross deficits Skin Bilateral forearms with macular rash with no induration or drainage.  Assessment and plan:  #Annual physical exam Normal exam except for weight, referral to nutrition given Labs today rechecking cholesterol and LFTs. Discussed colonoscopy- referral placed Offered Tdap, he will check for price with his insurance carrier  #Hyperlipidemia Recently started on pravastatin approximately 4-6 weeks ago, tolerating well.    Orders Placed This Encounter  Procedures  . Lipid panel  . Hepatic function panel  . Ambulatory referral to Gastroenterology    Referral Priority:   Routine    Referral Type:   Consultation    Referral Reason:   Specialty Services Required    Referred to Provider:   Rogene Houston, MD    Number of Visits Requested:   1  . Amb ref to Medical Nutrition Therapy-MNT    Referral Priority:   Routine    Referral Type:    Consultation    Referral Reason:   Specialty Services Required    Requested Specialty:   Nutrition    Number of Visits Requested:   [place], MD [facility] 04/30/2017, 10:41 AM

## 2017-05-01 LAB — HEPATIC FUNCTION PANEL
ALT: 29 IU/L (ref 0–44)
AST: 24 IU/L (ref 0–40)
Albumin: 4.4 g/dL (ref 3.5–5.5)
Alkaline Phosphatase: 84 IU/L (ref 39–117)
Bilirubin Total: 0.3 mg/dL (ref 0.0–1.2)
Bilirubin, Direct: 0.12 mg/dL (ref 0.00–0.40)
TOTAL PROTEIN: 7.2 g/dL (ref 6.0–8.5)

## 2017-05-01 LAB — LIPID PANEL
Chol/HDL Ratio: 5.7 ratio — ABNORMAL HIGH (ref 0.0–5.0)
Cholesterol, Total: 206 mg/dL — ABNORMAL HIGH (ref 100–199)
HDL: 36 mg/dL — AB (ref >39)
LDL CALC: 118 mg/dL — AB (ref 0–99)
Triglycerides: 259 mg/dL — ABNORMAL HIGH (ref 0–149)
VLDL CHOLESTEROL CAL: 52 mg/dL — AB (ref 5–40)

## 2017-05-16 DIAGNOSIS — M722 Plantar fascial fibromatosis: Secondary | ICD-10-CM | POA: Diagnosis not present

## 2017-06-04 DIAGNOSIS — L821 Other seborrheic keratosis: Secondary | ICD-10-CM | POA: Diagnosis not present

## 2017-06-04 DIAGNOSIS — L570 Actinic keratosis: Secondary | ICD-10-CM | POA: Diagnosis not present

## 2017-06-04 DIAGNOSIS — Z85828 Personal history of other malignant neoplasm of skin: Secondary | ICD-10-CM | POA: Diagnosis not present

## 2017-06-11 ENCOUNTER — Ambulatory Visit: Payer: Self-pay | Admitting: Nurse Practitioner

## 2017-06-12 ENCOUNTER — Encounter: Payer: BLUE CROSS/BLUE SHIELD | Attending: Family Medicine | Admitting: Nutrition

## 2017-06-12 ENCOUNTER — Encounter: Payer: Self-pay | Admitting: Nutrition

## 2017-06-12 VITALS — Ht 70.0 in | Wt 222.0 lb

## 2017-06-12 DIAGNOSIS — Z713 Dietary counseling and surveillance: Secondary | ICD-10-CM | POA: Insufficient documentation

## 2017-06-12 DIAGNOSIS — E669 Obesity, unspecified: Secondary | ICD-10-CM | POA: Insufficient documentation

## 2017-06-12 DIAGNOSIS — Z6831 Body mass index (BMI) 31.0-31.9, adult: Secondary | ICD-10-CM | POA: Insufficient documentation

## 2017-06-12 NOTE — Progress Notes (Signed)
[facility]  PATIENT: Jerry [name] DOB: 1965/09/07   REASON FOR VISIT: Follow-up for seizure disorder HISTORY FROM: Patient    HISTORY OF PRESENT ILLNESS: HISTORY: Jerry [name] is a 52 year old right-handed Caucasian [name], alone at visit, to followup his seizure disorder Last clinical visit was our office was in November 2013 with Jerry [name], he had a history of seizure disorder since subdural hematoma after a traumatic brain injury in 1984, previous abnormal EEG, he also had some anxiety disorder, he is no longer working. He lives with his mother, driving, last clinical seizure was September 2011, He has been on long-term combination therapy of Depakote ER 500 mg 2 tablets twice a day, high dose of Dilantin 100 mg 3 tablets twice a day, plus chewable 50 mg once a day He is getting assistance program for Depakote ER, coordinated through our office He is overall doing well, tolerating medications, no recurrent seizure  UPDATE 04/26/18[name] 52 year old [name] returns for follow-up. He has a history of seizure disorder and is currently on Depakote from  patient assistance and Dilantin 200 mg at night. Last seizure activity was 2011. He has had no new medical issues since last seen. He drives a car without difficulty. He returns for reevaluation and he needs labs to monitor any adverse effects of his seizure medications. UPDATE 5/9/2019[name], 52 year old [name] returns for follow-up with history of seizure disorder.  Last seizure occurred in September 2011.  Patient currently on Depakote 500mg  3 tabsTwice daily and Dilantin 100 mg 2 capsules twice daily.  No new medical issues since last seen.  He returns for reevaluation, labs and refills REVIEW OF SYSTEMS: Full 14 system review of systems performed and notable only for those listed, all others are neg:  Constitutional: neg  Cardiovascular: neg Ear/Nose/Throat: neg  Skin: neg Eyes: neg Respiratory:  neg Gastroitestinal: neg  Hematology/Lymphatic: neg  Endocrine: neg Musculoskeletal: neg Allergy/Immunology: neg Neurological: History of seizure disorder Psychiatric: neg Sleep : neg   ALLERGIES: No Known Allergies  HOME MEDICATIONS: Outpatient Medications Prior to Visit  Medication Sig Dispense Refill  . DEPAKOTE ER 500 MG 24 hr tablet 3 tabs po bid 540 tablet 0  . phenytoin (DILANTIN) 100 MG ER capsule Take by mouth 2 (two) times daily.    . pravastatin (PRAVACHOL) 40 MG tablet Take 1 tablet (40 mg total) by mouth daily. 90 tablet 0   No facility-administered medications prior to visit.     PAST MEDICAL HISTORY: Past Medical History:  Diagnosis Date  . Cancer ([employer])    skin  . Encounter for long-term (current) use of other medications   . Head injury   . Seizure ([place])   . Varicose veins   . Vasculitis ([place]) 11/2014    PAST SURGICAL HISTORY: Past Surgical History:  Procedure Laterality Date  . plate on head    . TOE INTERNAL FIXATION W/ COMPRESSION SCREW      FAMILY HISTORY: Family History  Problem Relation Age of Onset  . Diabetes Father     SOCIAL HISTORY: Social History   Socioeconomic History  . Marital status: Married    Spouse name: Not on file  . Number of children: 0  . Years of education: 71  . Highest education level: Not on file  Occupational History    Comment: Not working  Social Needs  . Financial resource strain: Not on file  . Food insecurity:    Worry: Not on file    Inability:  Not on file  . Transportation needs:    Medical: Not on file    Non-medical: Not on file  Tobacco Use  . Smoking status: Former Smoker    Packs/day: 0.50    Last attempt to quit: 10/21/2014    Years since quitting: 2.6  . Smokeless tobacco: Never Used  Substance and Sexual Activity  . Alcohol use: No    Alcohol/week: 0.0 oz  . Drug use: No  . Sexual activity: Not on file  Lifestyle  . Physical activity:    Days per week: Not on file    Minutes  per session: Not on file  . Stress: Not on file  Relationships  . Social connections:    Talks on phone: Not on file    Gets together: Not on file    Attends religious service: Not on file    Active member of club or organization: Not on file    Attends meetings of clubs or organizations: Not on file    Relationship status: Not on file  . Intimate partner violence:    Fear of current or ex partner: Not on file    Emotionally abused: Not on file    Physically abused: Not on file    Forced sexual activity: Not on file  Other Topics Concern  . Not on file  Social History Narrative   Patient is currently not [place] school education some college. Patient  Is single and lives with his mother.    Right handed.   Caffeine one cup daily.     PHYSICAL EXAM  Vitals:   06/13/17 1346  BP: 138/81  Pulse: 65  Weight: 223 lb 3.2 oz (101.2 kg)  Height: 5\' 10"  (1.778 m)   Body mass index is 32.03 kg/m. Generalized: In no acute distress Neck: Supple,  Musculoskeletal: No deformity  Neurological examination Mentation: Alert oriented to time, place, history taking, and causual conversation Cranial nerve II-XII: Pupils were equal round reactive to light. Extraocular movements were full. Visual field were full on confrontational test. Bilateral fundi were sharp. Facial sensation and strength were normal. Hearing was intact to finger rubbing bilaterally. Uvula tongue midline. Head turning and shoulder shrug and were normal and symmetric.Tongue protrusion into cheek strength was normal. Motor: Normal tone, bulk and strength. Sensory: Intact to fine touch,  In the upper and lower extremities Coordination: Normal finger to nose, heel-to-shin bilaterally normal, no dysmetria Gait: Rising up from seated position without assistance, normal stance,  moderate stride, good arm swing, smooth turning, able to perform tiptoe, and heel walking without difficulty. Tandem gait normal Romberg signs:  Negative Deep tendon reflexes: Brachioradialis 2/2, biceps 2/2, triceps 2/2, patellar 2/2, Achilles 2/2, plantar responses were flexor bilaterally.   DIAGNOSTIC DATA (LABS, IMAGING, TESTING) -  ASSESSMENT AND PLAN   52 y.o. year old [name]  has a past medical history of Seizure; Head injury; hypertension and hyperlipidemia  here to follow-up.   Patient had last seizure September 2011.  He is currently well controlled on Depakote and Dilantin.  Will check labs today, CBC CMP, to monitor side effects of seizure meds Dilantin and VPA levels to make sure therapeutic/vs toxcity Continue Depakote at current dose will refill   Continue Dilantin at current dose will refill once labs are back Follow-up yearly and when necessary Call for seizure activity Jerry [name], [facility], [facility] [address], [place], [place] [address] [phone]

## 2017-06-12 NOTE — Progress Notes (Signed)
  Medical Nutrition Therapy:  Appt start time: 0800 end time:  0900.   Assessment:  Primary concerns today: Overweight. Lives with his mom. PMH: Seizure disorder and High Cholesterol.  Works from 7 p to [place]. Eats 1-2 times per day. Currently desires to weigh 200 lbs.  Cares for his mom who is disabled. He doesn't cook much. Eats out fast foods 1-2 meals per day.  No smoker. Not physically active outside his job. Diet is high in sugar, fat and salt intake and low in fresh fruits and vegetables. Current diet contributes to his hyperlipidemia. He is at high risk for CVD.  CMP Latest Ref Rng & Units 04/30/2017 03/19/2017 05/31/2016  Glucose 65 - 99 mg/dL - 79 82  BUN 6 - 24 mg/dL - 9 11  Creatinine 0.76 - 1.27 mg/dL - 0.95 0.84  Sodium 134 - 144 mmol/L - 143 145(H)  Potassium 3.5 - 5.2 mmol/L - 4.4 4.2  Chloride 96 - 106 mmol/L - 103 102  CO2 20 - 29 mmol/L - 23 27  Calcium 8.7 - 10.2 mg/dL - 9.4 9.1  Total Protein 6.0 - 8.5 g/dL 7.2 7.2 7.0  Total Bilirubin 0.0 - 1.2 mg/dL 0.3 0.3 0.3  Alkaline Phos 39 - 117 IU/L 84 90 85  AST 0 - 40 IU/L 24 23 22   ALT 0 - 44 IU/L 29 27 21    Lipid Panel     Component Value Date/Time   CHOL 206 (H) 04/30/2017 1045   TRIG 259 (H) 04/30/2017 1045   HDL 36 (L) 04/30/2017 1045   CHOLHDL 5.7 (H) 04/30/2017 1045   LDLCALC 118 (H) 04/30/2017 1045    Preferred Learning Style:   No preference indicated   Learning Readiness:   Ready  Change in progress   MEDICATIONS:   DIETARY INTAKE:  24-hr recall:  B) Pork chop gravy biscuit, Coke Snk ( AM):  L ( PM): Chicken salad sandwich on white bread, CHeerwine Snk ( PM): Mt Dew D ( PM): Salmon,  [place], Mt Dew, lemonade/tea Snk ( PM): [employer] sport Beverages: Soda,   Usual physical activity:   Estimated energy needs: 1800 calories 200 g carbohydrates 135 g protein 50 g fat  Progress Towards Goal(s):  In progress.   Nutritional Diagnosis:  NB-1.1 Food and nutrition-related  knowledge deficit As related to Obesity.  As evidenced by BMI > 30.    Intervention:  Nutrition and  Weight loss education provided on My Plate, CHO counting, meal planning, portion sizes, timing of meals, avoiding snacks between meals  taking medications as prescribed, benefits of exercising 60 minutes per day and prevention of DM.  Goals 1. Follow MY Plate 2. Eat three meals a day. 3. Cut out sodas and drink water with lemon 4. Cut out snacks - 5. Increase fresh fruits and vegetables. Lose 1-2 lbs in lbs per week. Teaching Method Utilized:  Visual Auditory Hands on  Handouts given during visit include:  The Plate Method  Meal Plan Card Weight loss tips.    Barriers to learning/adherence to lifestyle change: none  Demonstrated degree of understanding via:  Teach Back   Monitoring/Evaluation:  Dietary intake, exercise, meal planning, and body weight in 1 month(s).

## 2017-06-12 NOTE — Patient Instructions (Signed)
Goals 1. Follow MY Plate 2. Eat three meals a day. 3. Cut out sodas and drink water with lemon 4. Cut out snacks - 5. Increase fresh fruits and vegetables. Lose 1-2 lbs in lbs per week.

## 2017-06-13 ENCOUNTER — Encounter: Payer: Self-pay | Admitting: Nurse Practitioner

## 2017-06-13 ENCOUNTER — Ambulatory Visit: Payer: BLUE CROSS/BLUE SHIELD | Admitting: Nurse Practitioner

## 2017-06-13 VITALS — BP 138/81 | HR 65 | Ht 70.0 in | Wt 223.2 lb

## 2017-06-13 DIAGNOSIS — R569 Unspecified convulsions: Secondary | ICD-10-CM

## 2017-06-13 DIAGNOSIS — Z5181 Encounter for therapeutic drug level monitoring: Secondary | ICD-10-CM

## 2017-06-13 NOTE — Patient Instructions (Signed)
Will check labs today, CBC CMP, to monitor side effects of seizure meds Dilantin and VPA levels to make sure therapeutic/vs toxcity Continue Depakote at current dose  Will refill when labs are back Continue Dilantin at current dose will refill once labs are back Follow-up yearly and when necessary Call for seizure activity

## 2017-06-14 ENCOUNTER — Telehealth: Payer: Self-pay | Admitting: *Deleted

## 2017-06-14 ENCOUNTER — Other Ambulatory Visit: Payer: Self-pay | Admitting: Nurse Practitioner

## 2017-06-14 LAB — COMPREHENSIVE METABOLIC PANEL WITH GFR
ALT: 25 IU/L (ref 0–44)
AST: 25 IU/L (ref 0–40)
Albumin/Globulin Ratio: 1.7 (ref 1.2–2.2)
Albumin: 4.5 g/dL (ref 3.5–5.5)
Alkaline Phosphatase: 90 IU/L (ref 39–117)
BUN/Creatinine Ratio: 12 (ref 9–20)
BUN: 11 mg/dL (ref 6–24)
Bilirubin Total: 0.3 mg/dL (ref 0.0–1.2)
CO2: 25 mmol/L (ref 20–29)
Calcium: 9.5 mg/dL (ref 8.7–10.2)
Chloride: 105 mmol/L (ref 96–106)
Creatinine, Ser: 0.95 mg/dL (ref 0.76–1.27)
GFR calc Af Amer: 107 mL/min/1.73
GFR calc non Af Amer: 93 mL/min/1.73
Globulin, Total: 2.6 g/dL (ref 1.5–4.5)
Glucose: 81 mg/dL (ref 65–99)
Potassium: 4.6 mmol/L (ref 3.5–5.2)
Sodium: 144 mmol/L (ref 134–144)
Total Protein: 7.1 g/dL (ref 6.0–8.5)

## 2017-06-14 LAB — CBC WITH DIFFERENTIAL/PLATELET
BASOS: 1 %
Basophils Absolute: 0 10*3/uL (ref 0.0–0.2)
EOS (ABSOLUTE): 0.1 10*3/uL (ref 0.0–0.4)
Eos: 2 %
HEMATOCRIT: 41.8 % (ref 37.5–51.0)
HEMOGLOBIN: 14.8 g/dL (ref 13.0–17.7)
IMMATURE GRANS (ABS): 0.1 10*3/uL (ref 0.0–0.1)
IMMATURE GRANULOCYTES: 1 %
LYMPHS: 27 %
Lymphocytes Absolute: 2.4 10*3/uL (ref 0.7–3.1)
MCH: 33.3 pg — ABNORMAL HIGH (ref 26.6–33.0)
MCHC: 35.4 g/dL (ref 31.5–35.7)
MCV: 94 fL (ref 79–97)
MONOCYTES: 11 %
Monocytes Absolute: 0.9 10*3/uL (ref 0.1–0.9)
NEUTROS PCT: 58 %
Neutrophils Absolute: 5.3 10*3/uL (ref 1.4–7.0)
Platelets: 218 10*3/uL (ref 150–379)
RBC: 4.44 x10E6/uL (ref 4.14–5.80)
RDW: 13.9 % (ref 12.3–15.4)
WBC: 8.9 10*3/uL (ref 3.4–10.8)

## 2017-06-14 LAB — VALPROIC ACID LEVEL: VALPROIC ACID LVL: 86 ug/mL (ref 50–100)

## 2017-06-14 LAB — PHENYTOIN LEVEL, TOTAL: Phenytoin (Dilantin), Serum: 1.8 ug/mL — ABNORMAL LOW (ref 10.0–20.0)

## 2017-06-14 MED ORDER — DEPAKOTE ER 500 MG PO TB24: ORAL_TABLET | ORAL | 3 refills | Status: DC

## 2017-06-14 MED ORDER — PHENYTOIN SODIUM EXTENDED 100 MG PO CAPS: 200.0000 mg | ORAL_CAPSULE | Freq: Two times a day (BID) | ORAL | 11 refills | Status: DC

## 2017-06-14 NOTE — Progress Notes (Signed)
I have reviewed and agreed above plan. 

## 2017-06-14 NOTE — Telephone Encounter (Signed)
Spoke with patient's mother, on [place] and informed her his labs are stable. Advised her [name] reordered his seizure medications. She verbalized understanding, appreciation.

## 2017-07-10 ENCOUNTER — Other Ambulatory Visit: Payer: Self-pay | Admitting: Family Medicine

## 2017-07-30 ENCOUNTER — Ambulatory Visit: Payer: Self-pay | Admitting: Nutrition

## 2017-07-30 ENCOUNTER — Encounter: Payer: Self-pay | Admitting: Family Medicine

## 2017-07-30 ENCOUNTER — Ambulatory Visit: Payer: BLUE CROSS/BLUE SHIELD | Admitting: Family Medicine

## 2017-07-30 VITALS — BP 139/85 | HR 69 | Temp 97.6°F | Ht 70.0 in | Wt 220.4 lb

## 2017-07-30 DIAGNOSIS — I878 Other specified disorders of veins: Secondary | ICD-10-CM | POA: Diagnosis not present

## 2017-07-30 DIAGNOSIS — I83893 Varicose veins of bilateral lower extremities with other complications: Secondary | ICD-10-CM | POA: Diagnosis not present

## 2017-07-30 DIAGNOSIS — I872 Venous insufficiency (chronic) (peripheral): Secondary | ICD-10-CM | POA: Diagnosis not present

## 2017-07-30 MED ORDER — TRIAMCINOLONE ACETONIDE 0.5 % EX OINT: 1.0000 "application " | TOPICAL_OINTMENT | Freq: Two times a day (BID) | CUTANEOUS | 1 refills | Status: DC | PRN

## 2017-07-30 NOTE — Progress Notes (Signed)
   HPI  Patient presents today for leg swelling.  This is been a problem over months, he also has distended veins in his left leg worse than his right leg.  At times he has itchy red lesions on his legs are swollen.  The ointment prescribed previously was very effective for this.  He is tried some over-the-counter compression stockings with moderate improvement.  He had some thigh-high compression stockings which fell so often they were effective.  PMH: Smoking status noted ROS: Per HPI  Objective: BP 139/85   Pulse 69   Temp 97.6 F (36.4 C) (Oral)   Ht 5\' 10"  (1.778 m)   Wt 220 lb 6.4 oz (100 kg)   BMI 31.62 kg/m  Gen: NAD, alert, cooperative with exam HEENT: NCAT CV: RRR, good S1/S2, no murmur Resp: CTABL, no wheezes, non-labored Ext: 1-2+ pitting edema bilateral lower extremities symmetric, varicose veins most prominent on the left, hemosiderin staining in bilateral feet, mild erythema consistent with venous stasis dermatitis on the left calf Neuro: Alert and oriented, No gross deficits  Assessment and plan:  #Leg swelling, venous stasis, venous stasis dermatitis, varicose veins Patient with leg swelling most likely from symptomatic varicose veins developing venous stasis and venous stasis dermatitis Compression stockings recommended, prescription given, he has a specific type that he is looking for so he will go to 1 of the larger stores in [place]. Kenalog ointment for dermatitis when it appears Recent renal function is normal, no signs of CHF    Meds ordered this encounter  Medications  . triamcinolone ointment (KENALOG) 0.5 %    Sig: Apply 1 application topically 2 (two) times daily as needed.    Dispense:  60 g    Refill:  [place], MD [facility] 07/30/2017, 8:42 AM

## 2017-07-30 NOTE — Patient Instructions (Signed)
Great to see you!  Try [facility] or [facility] in [place] ( phone number [phone])

## 2017-07-30 NOTE — Progress Notes (Signed)
Opened in error

## 2017-10-15 ENCOUNTER — Telehealth: Payer: Self-pay | Admitting: Nurse Practitioner

## 2017-10-15 NOTE — Telephone Encounter (Signed)
Called and spoke to patient and he may not have insurance anymore. Patient will call me when he is for sure and we will submit Patient assistance. For his Depakote .

## 2017-10-15 NOTE — Telephone Encounter (Signed)
Pt requesting a call to discuss DEPAKOTE ER 500 MG 24 hr tablet please advise

## 2017-10-30 ENCOUNTER — Ambulatory Visit: Payer: BLUE CROSS/BLUE SHIELD | Admitting: Family Medicine

## 2017-10-30 ENCOUNTER — Encounter: Payer: Self-pay | Admitting: Family Medicine

## 2017-10-30 VITALS — BP 137/84 | HR 66 | Temp 97.4°F | Ht 70.0 in | Wt 231.0 lb

## 2017-10-30 DIAGNOSIS — L309 Dermatitis, unspecified: Secondary | ICD-10-CM

## 2017-10-30 DIAGNOSIS — K219 Gastro-esophageal reflux disease without esophagitis: Secondary | ICD-10-CM | POA: Diagnosis not present

## 2017-10-30 DIAGNOSIS — Z23 Encounter for immunization: Secondary | ICD-10-CM | POA: Diagnosis not present

## 2017-10-30 DIAGNOSIS — E785 Hyperlipidemia, unspecified: Secondary | ICD-10-CM

## 2017-10-30 DIAGNOSIS — G40909 Epilepsy, unspecified, not intractable, without status epilepticus: Secondary | ICD-10-CM

## 2017-10-30 LAB — CBC WITH DIFFERENTIAL/PLATELET
BASOS: 1 %
Basophils Absolute: 0.1 10*3/uL (ref 0.0–0.2)
EOS (ABSOLUTE): 0.1 10*3/uL (ref 0.0–0.4)
EOS: 1 %
HEMATOCRIT: 39.1 % (ref 37.5–51.0)
Hemoglobin: 13.6 g/dL (ref 13.0–17.7)
IMMATURE GRANS (ABS): 0.2 10*3/uL — AB (ref 0.0–0.1)
Immature Granulocytes: 2 %
LYMPHS: 28 %
Lymphocytes Absolute: 2.5 10*3/uL (ref 0.7–3.1)
MCH: 33.1 pg — AB (ref 26.6–33.0)
MCHC: 34.8 g/dL (ref 31.5–35.7)
MCV: 95 fL (ref 79–97)
MONOCYTES: 12 %
MONOS ABS: 1 10*3/uL — AB (ref 0.1–0.9)
NEUTROS ABS: 4.9 10*3/uL (ref 1.4–7.0)
Neutrophils: 56 %
PLATELETS: 228 10*3/uL (ref 150–450)
RBC: 4.11 x10E6/uL — ABNORMAL LOW (ref 4.14–5.80)
RDW: 12.7 % (ref 12.3–15.4)
WBC: 8.7 10*3/uL (ref 3.4–10.8)

## 2017-10-30 LAB — CMP14+EGFR
ALT: 28 IU/L (ref 0–44)
AST: 32 IU/L (ref 0–40)
Albumin/Globulin Ratio: 1.5 (ref 1.2–2.2)
Albumin: 4.3 g/dL (ref 3.5–5.5)
Alkaline Phosphatase: 77 IU/L (ref 39–117)
BUN/Creatinine Ratio: 12 (ref 9–20)
BUN: 13 mg/dL (ref 6–24)
Bilirubin Total: 0.3 mg/dL (ref 0.0–1.2)
CALCIUM: 9.1 mg/dL (ref 8.7–10.2)
CO2: 24 mmol/L (ref 20–29)
CREATININE: 1.05 mg/dL (ref 0.76–1.27)
Chloride: 103 mmol/L (ref 96–106)
GFR, EST AFRICAN AMERICAN: 95 mL/min/{1.73_m2} (ref >59)
GFR, EST NON AFRICAN AMERICAN: 82 mL/min/{1.73_m2} (ref >59)
GLUCOSE: 81 mg/dL (ref 65–99)
Globulin, Total: 2.8 g/dL (ref 1.5–4.5)
Potassium: 4 mmol/L (ref 3.5–5.2)
Sodium: 143 mmol/L (ref 134–144)
TOTAL PROTEIN: 7.1 g/dL (ref 6.0–8.5)

## 2017-10-30 MED ORDER — PANTOPRAZOLE SODIUM 40 MG PO TBEC: 40.0000 mg | DELAYED_RELEASE_TABLET | Freq: Every day | ORAL | 0 refills | Status: DC

## 2017-10-30 NOTE — Patient Instructions (Signed)
You had labs performed today.  You will be contacted with the results of the labs once they are available, usually in the next 3 business days for routine lab work.    Food Choices for Gastroesophageal Reflux Disease, Adult When you have gastroesophageal reflux disease (GERD), the foods you eat and your eating habits are very important. Choosing the right foods can help ease your discomfort. What guidelines do I need to follow?  Choose fruits, vegetables, whole grains, and low-fat dairy products.  Choose low-fat meat, fish, and poultry.  Limit fats such as oils, salad dressings, butter, nuts, and avocado.  Keep a food diary. This helps you identify foods that cause symptoms.  Avoid foods that cause symptoms. These may be different for everyone.  Eat small meals often instead of 3 large meals a day.  Eat your meals slowly, in a place where you are relaxed.  Limit fried foods.  Cook foods using methods other than frying.  Avoid drinking alcohol.  Avoid drinking large amounts of liquids with your meals.  Avoid bending over or lying down until 2-3 hours after eating. What foods are not recommended? These are some foods and drinks that may make your symptoms worse: Vegetables Tomatoes. Tomato juice. Tomato and spaghetti sauce. Chili peppers. Onion and garlic. Horseradish. Fruits Oranges, grapefruit, and lemon (fruit and juice). Meats High-fat meats, fish, and poultry. This includes hot dogs, ribs, ham, sausage, salami, and bacon. Dairy Whole milk and chocolate milk. Sour cream. Cream. Butter. Ice cream. Cream cheese. Drinks Coffee and tea. Bubbly (carbonated) drinks or energy drinks. Condiments Hot sauce. Barbecue sauce. Sweets/Desserts Chocolate and cocoa. Donuts. Peppermint and spearmint. Fats and Oils High-fat foods. This includes [place] fries and potato chips. Other Vinegar. Strong spices. This includes black pepper, white pepper, red pepper, cayenne, curry powder,  cloves, ginger, and chili powder. The items listed above may not be a complete list of foods and drinks to avoid. Contact your dietitian for more information. This information is not intended to replace advice given to you by your health care provider. Make sure you discuss any questions you have with your health care provider. Document Released: 07/24/2011 Document Revised: 06/30/2015 Document Reviewed: 11/26/2012 Elsevier Interactive Patient Education  2017 [employer].

## 2017-10-30 NOTE — Progress Notes (Signed)
Subjective: CC: Chronic follow-up for seizure disorder, hyperlipidemia PCP: Janora Norlander, DO DTO:[id number] Jerry [name] is a 52 y.o. male presenting to clinic today for:  1.  Seizure disorder Patient reports that he is been seizure-free for about 8 or 9 years.  He does continue to follow-up yearly with neurology.  He has been well controlled on Depakote and Dilantin.  Denies any side effects.  2.  Acid reflux Patient reports that he has had acid reflux symptoms for the last several weeks.  He tried over-the-counter Nexium but did not notice a great deal of improvement.  He describes burning in his stomach but denies any nausea, vomiting, hematochezia, melena.  3.  Hyperlipidemia Patient reports compliance with pravastatin.  He denies any side effects from the medication.  4.  Rash Patient reports he gets a rash on bilateral lower extremities, particularly after he has been standing all day and has swelling in his legs.  He has a similar rash on the left upper extremity.  He notes that this typically occurs after he has been at work all day.  He denies significant itching or pain.  He does not use the triamcinolone on either the upper externally or lower extremity but states that he does have some at home.   ROS: Per HPI  No Known Allergies Past Medical History:  Diagnosis Date  . Cancer ([place])    skin  . Encounter for long-term (current) use of other medications   . Head injury   . Seizure ([place])   . Varicose veins   . Vasculitis ([place]) 11/2014    Current Outpatient Medications:  .  DEPAKOTE ER 500 MG 24 hr tablet, 3 tabs po bid, Disp: 540 tablet, Rfl: 3 .  phenytoin (DILANTIN) 100 MG ER capsule, Take 2 capsules (200 mg total) by mouth 2 (two) times daily., Disp: 120 capsule, Rfl: 11 .  pravastatin (PRAVACHOL) 40 MG tablet, TAKE 1 TABLET BY MOUTH ONCE DAILY, Disp: 90 tablet, Rfl: 1 .  triamcinolone ointment (KENALOG) 0.5 %, Apply 1 application topically 2 (two) times daily as  needed. (Patient not taking: Reported on 10/30/2017), Disp: 60 g, Rfl: 1 Social History   Socioeconomic History  . Marital status: Married    Spouse name: Not on file  . Number of children: 0  . Years of education: 81  . Highest education level: Not on file  Occupational History    Comment: Not working  Social Needs  . Financial resource strain: Not on file  . Food insecurity:    Worry: Not on file    Inability: Not on file  . Transportation needs:    Medical: Not on file    Non-medical: Not on file  Tobacco Use  . Smoking status: Former Smoker    Packs/day: 0.50    Last attempt to quit: 10/21/2014    Years since quitting: 3.0  . Smokeless tobacco: Never Used  Substance and Sexual Activity  . Alcohol use: No    Alcohol/week: 0.0 standard drinks  . Drug use: No  . Sexual activity: Not on file  Lifestyle  . Physical activity:    Days per week: Not on file    Minutes per session: Not on file  . Stress: Not on file  Relationships  . Social connections:    Talks on phone: Not on file    Gets together: Not on file    Attends religious service: Not on file    Active member of club or  organization: Not on file    Attends meetings of clubs or organizations: Not on file    Relationship status: Not on file  . Intimate partner violence:    Fear of current or ex partner: Not on file    Emotionally abused: Not on file    Physically abused: Not on file    Forced sexual activity: Not on file  Other Topics Concern  . Not on file  Social History Narrative   Patient is currently not [place] school education some college. Patient  Is single and lives with his mother.    Right handed.   Caffeine one cup daily.   Family History  Problem Relation Age of Onset  . Diabetes Father     Objective: Office vital signs reviewed. BP 137/84   Pulse 66   Temp (!) 97.4 F (36.3 C) (Oral)   Ht '5\' 10"'  (1.778 m)   Wt 231 lb (104.8 kg)   BMI 33.15 kg/m   Physical Examination:    General: Awake, alert, well nourished, No acute distress HEENT: Normal    Neck: No masses palpated. No lymphadenopathy    Eyes: PERRLA, extraocular membranes intact, sclera white    Nose: nasal turbinates moist, no nasal discharge    Throat: moist mucus membranes, no erythema, no tonsillar exudate.  Airway is patent Cardio: regular rate  Pulm: normal work of breathing on room air GI: soft, non-tender, non-distended, bowel sounds present x4, no hepatomegaly, no splenomegaly, no masses Neuro: Speech/thought process somewhat slowed.  He follows commands.  No focal neurologic deficits noted Skin: Some venous stasis dermatitis noted along the medial aspect of the left lower extremity.  He also has a blanching, erythematous maculopapular rash along the dorsal aspect of the left upper extremity.  Assessment/ Plan: 52 y.o. male   1. Seizure disorder ([place]) Well-controlled on current regimen.  Asymptomatic.  Will check interval labs. - CMP14+EGFR - CBC with Differential  2. Hyperlipidemia, unspecified hyperlipidemia type Doing well on pravastatin.  Does not need a refill at this time. - CMP14+EGFR  3. Dermatitis Flare noted on the left upper extremity and left lower extremity.  I encouraged him to use the triamcinolone if he finds this to be irritating or itchy.  4. Gastroesophageal reflux disease without esophagitis No red flag symptoms or signs.  Wants to try a different PPI.  I have prescribed him Protonix 40 mg daily.  Handout also provided on ways to reduce acid reflux through diet.  He will follow-up in 1 month or sooner if needed.  5. Need for influenza vaccination Influenza vaccine administered today.   Orders Placed This Encounter  Procedures  . CMP14+EGFR  . CBC with Differential   Meds ordered this encounter  Medications  . pantoprazole (PROTONIX) 40 MG tablet    Sig: Take 1 tablet (40 mg total) by mouth daily.    Dispense:  30 tablet    Refill:  [place], DO [place] [phone]

## 2017-11-22 ENCOUNTER — Other Ambulatory Visit: Payer: Self-pay | Admitting: Physician Assistant

## 2017-11-22 ENCOUNTER — Telehealth: Payer: Self-pay | Admitting: Family Medicine

## 2017-11-22 MED ORDER — ESOMEPRAZOLE MAGNESIUM 40 MG PO CPDR
40.0000 mg | DELAYED_RELEASE_CAPSULE | Freq: Every day | ORAL | 0 refills | Status: DC
Start: 2017-11-22 — End: 2018-04-30

## 2017-11-22 NOTE — Telephone Encounter (Signed)
Sent one month, can follow up with PCP

## 2017-11-22 NOTE — Telephone Encounter (Signed)
Patient states pantoprazole (PROTONIX) 40 MG tablet is not working at all. Patient tries to take it at the same time every day, but it is hard with the shift he works.Aslo he thinks it makes him feel nauseated.  Patient is asking to change it to Nexium 40 mg states he has taken it before and know it works. Patient uses [facility] in [place].

## 2017-12-04 ENCOUNTER — Telehealth: Payer: Self-pay | Admitting: Nurse Practitioner

## 2017-12-04 NOTE — Telephone Encounter (Signed)
Pt requesting a call stating he is has a few question regarding his DEPAKOTE ER 500 MG 24 hr tablet

## 2017-12-05 NOTE — Telephone Encounter (Signed)
Spoke to patient and he is not working and has no insurance patient is coming on Tues so I can help him with patient assistance.

## 2017-12-10 MED ORDER — PHENYTOIN SODIUM EXTENDED 100 MG PO CAPS: 200.0000 mg | ORAL_CAPSULE | Freq: Two times a day (BID) | ORAL | 11 refills | Status: DC

## 2017-12-10 MED ORDER — DEPAKOTE ER 500 MG PO TB24: ORAL_TABLET | ORAL | 3 refills | Status: DC

## 2017-12-10 NOTE — Telephone Encounter (Signed)
Reviewed and signed

## 2017-12-10 NOTE — Addendum Note (Signed)
Addended by: Thomes Cake on: 12/10/2017 08:21 AM   Modules accepted: Orders

## 2017-12-10 NOTE — Telephone Encounter (Signed)
Patient came to the office yesterday to sign his paper work for patient assistance for his Dilantin and Depakote .   I will need a Printed RX for Dilantin 100 mg ER and Depakote 500 mg . To submit for patient assistance  .  [place] , I will bring you form's  back that [name] needs to sign thanks Hinton Dyer.

## 2017-12-10 NOTE — Telephone Encounter (Signed)
Sent to [facility] Patient assistance Program For Depakote Fax [phone]  Sent to [place] Patient assistance  program for Dilantin - fax [phone].

## 2017-12-31 ENCOUNTER — Other Ambulatory Visit: Payer: Self-pay | Admitting: *Deleted

## 2017-12-31 MED ORDER — PHENYTOIN SODIUM EXTENDED 100 MG PO CAPS: 200.0000 mg | ORAL_CAPSULE | Freq: Two times a day (BID) | ORAL | 11 refills | Status: DC

## 2017-12-31 MED ORDER — DEPAKOTE ER 500 MG PO TB24: ORAL_TABLET | ORAL | 3 refills | Status: DC

## 2017-12-31 NOTE — Telephone Encounter (Signed)
Prescription reprinted for depakote pt assistance.  Signed by Dr. Krista Blue and then placed on Jayme Cloud desk.

## 2017-12-31 NOTE — Telephone Encounter (Signed)
Dilantin prescription fax (confirmation received) to [facility] [phone].

## 2018-01-13 ENCOUNTER — Telehealth: Payer: Self-pay | Admitting: Nurse Practitioner

## 2018-01-13 NOTE — Telephone Encounter (Signed)
Called and spoke to patient his patient assistance Depakote ER 6 bottles [employer] [phone].

## 2018-01-14 NOTE — Telephone Encounter (Signed)
Pt has called asking if his medication (Depakote) was up front.  Up front was checked and there was no medication for pt.  Please call

## 2018-01-14 NOTE — Telephone Encounter (Signed)
Patients medication is here, I called to let the patient know I was placing his medicine up front.

## 2018-01-15 NOTE — Telephone Encounter (Signed)
Noted thank you

## 2018-01-30 ENCOUNTER — Encounter: Payer: Self-pay | Admitting: Nurse Practitioner

## 2018-04-02 ENCOUNTER — Telehealth: Payer: Self-pay | Admitting: Nurse Practitioner

## 2018-04-02 NOTE — Telephone Encounter (Signed)
Patient called and stated he is down to 1.5 bottles. He wanted to make [facility] aware, I gave him the number to [employer] PAP also.

## 2018-04-07 NOTE — Telephone Encounter (Signed)
6 bottles of Depakote came today . Called Patient he is aware.  ID # Depakote [id number] [phone] - [employer]   600 ER 500 mg .  Made patient aware to call me when he is on his last bottle . Thanks Hinton Dyer .

## 2018-04-28 ENCOUNTER — Telehealth: Payer: Self-pay | Admitting: Family Medicine

## 2018-04-28 NOTE — Telephone Encounter (Signed)
Spoke with patient, appointment scheduled for 05/02/2018 at 4:00 pm.

## 2018-04-30 ENCOUNTER — Other Ambulatory Visit: Payer: Self-pay

## 2018-04-30 ENCOUNTER — Ambulatory Visit: Payer: BLUE CROSS/BLUE SHIELD | Admitting: Family Medicine

## 2018-04-30 ENCOUNTER — Telehealth (INDEPENDENT_AMBULATORY_CARE_PROVIDER_SITE_OTHER): Payer: Self-pay | Admitting: Family Medicine

## 2018-04-30 DIAGNOSIS — Z125 Encounter for screening for malignant neoplasm of prostate: Secondary | ICD-10-CM

## 2018-04-30 DIAGNOSIS — J301 Allergic rhinitis due to pollen: Secondary | ICD-10-CM

## 2018-04-30 DIAGNOSIS — E782 Mixed hyperlipidemia: Secondary | ICD-10-CM

## 2018-04-30 DIAGNOSIS — G40909 Epilepsy, unspecified, not intractable, without status epilepticus: Secondary | ICD-10-CM

## 2018-04-30 MED ORDER — FLUTICASONE PROPIONATE 50 MCG/ACT NA SUSP: 2.0000 | Freq: Every day | NASAL | 6 refills | Status: DC

## 2018-04-30 MED ORDER — PRAVASTATIN SODIUM 40 MG PO TABS: 40.0000 mg | ORAL_TABLET | Freq: Every day | ORAL | 1 refills | Status: DC

## 2018-04-30 NOTE — Progress Notes (Signed)
Telephone visit  Subjective: CC: sore throat PCP: Janora Norlander, DO OHY:[id number] Jerry [name] is a 53 y.o. male calls for telephone consult today. Patient provides verbal consent for consult held via phone.  Location of patient: home Location of provider: [facility] Others present for call: none  1. Sore throat Patient reports abrupt onset of scratchy, sore throat this morning.  Reports associated nasal congestion.  He has had a mild nonproductive cough.  Denies any headache, myalgia, shortness of breath or wheeze.  No nausea, vomiting or diarrhea.  No known fevers.  Denies any diaphoresis.  He has started Zyrtec this morning as well as takes a daily immune system booster with vitamin C.  He was told by his work to not come in if they developed any URI symptoms.  He does not feel like he has any types of infections going on.  He states that he gets these types of symptoms annually with a drop of the pollen.  He did go ahead and stay home though just in case and plans to stay put for the next couple of days while waiting for his allergy symptoms to resolve.  He does not think he needs a work note at this time but will contact me if he needs one.  2.  Seizure disorder Patient reports good control seizure disorder.  He continues to follow-up with neurology intervally.  He reports compliance with antiseizure medications.  Again, no seizures in greater than 9 years now.  3.  Hyperlipidemia Patient reports that he is no longer taking his pravastatin.  He is not drinking any alcohol and reports he is not using any cigarettes.  No chest pain, shortness of breath.   ROS: Per HPI  No Known Allergies Past Medical History:  Diagnosis Date  . Cancer ([place])    skin  . Encounter for long-term (current) use of other medications   . Head injury   . Seizure ([place])   . Varicose veins   . Vasculitis ([place]) 11/2014    Current Outpatient Medications:  .  DEPAKOTE ER 500 MG 24 hr tablet, 3 tabs po bid, Disp:  540 tablet, Rfl: 3 .  fluticasone (FLONASE) 50 MCG/ACT nasal spray, Place 2 sprays into both nostrils daily., Disp: 16 g, Rfl: 6 .  phenytoin (DILANTIN) 100 MG ER capsule, Take 2 capsules (200 mg total) by mouth 2 (two) times daily., Disp: 120 capsule, Rfl: 11 .  pravastatin (PRAVACHOL) 40 MG tablet, Take 1 tablet (40 mg total) by mouth daily., Disp: 90 tablet, Rfl: 1 .  triamcinolone ointment (KENALOG) 0.5 %, Apply 1 application topically 2 (two) times daily as needed. (Patient not taking: Reported on 10/30/2017), Disp: 60 g, Rfl: 1  Assessment/ Plan: 53 y.o. male   1. Seasonal allergic rhinitis due to pollen Currently not controlled.  Agree with use of daily Zyrtec nightly.  Add Flonase nasal spray.  Instructions for use discussed. - fluticasone (FLONASE) 50 MCG/ACT nasal spray; Place 2 sprays into both nostrils daily.  Dispense: 16 g; Refill: 6  2. Seizure disorder ([place]) Controlled.  Gets refills from neurology.  Future CMP and CBC ordered for medication monitoring - CMP14+EGFR; Future - CBC; Future  3. Mixed hyperlipidemia Not compliant with antilipid medication.  We discussed risk of CVA and MI.  Pravachol refilled.  Plan for fasting lipid panel in about 3 months if able. - pravastatin (PRAVACHOL) 40 MG tablet; Take 1 tablet (40 mg total) by mouth daily.  Dispense: 90 tablet; Refill: 1 -  Lipid Panel; Future  4. Screening for malignant neoplasm of prostate No baseline PSA on record.  Obtain with fasting labs. - PSA; Future   Start time: 9:12am End time: 9:21am  Meds ordered this encounter  Medications  . pravastatin (PRAVACHOL) 40 MG tablet    Sig: Take 1 tablet (40 mg total) by mouth daily.    Dispense:  90 tablet    Refill:  1  . fluticasone (FLONASE) 50 MCG/ACT nasal spray    Sig: Place 2 sprays into both nostrils daily.    Dispense:  16 g    Refill:  [place], [place] [phone]

## 2018-04-30 NOTE — Patient Instructions (Signed)
Your symptoms sound to be allergy induced at this time.  We discussed signs and symptoms of infection and reasons for reevaluation.  Go ahead and keep taking the Zyrtec daily.  I have sent in Flonase for you to use 2 sprays in each nostril once daily.  This should help with any sinus symptoms.  We discussed that you should resume use of your cholesterol medicine.  You may come in for fasting labs sometime in the summer.  These orders are in place and you can come at your convenience for them.   Allergic Rhinitis, Adult Allergic rhinitis is a reaction to allergens in the air. Allergens are tiny specks (particles) in the air that cause your body to have an allergic reaction. This condition cannot be passed from person to person (is not contagious). Allergic rhinitis cannot be cured, but it can be controlled. There are two types of allergic rhinitis:  Seasonal. This type is also called hay fever. It happens only during certain times of the year.  Perennial. This type can happen at any time of the year. What are the causes? This condition may be caused by:  Pollen from grasses, trees, and weeds.  House dust mites.  Pet dander.  Mold. What are the signs or symptoms? Symptoms of this condition include:  Sneezing.  Runny or stuffy nose (nasal congestion).  A lot of mucus in the back of the throat (postnasal drip).  Itchy nose.  Tearing of the eyes.  Trouble sleeping.  Being sleepy during day. How is this treated? There is no cure for this condition. You should avoid things that trigger your symptoms (allergens). Treatment can help to relieve symptoms. This may include:  Medicines that block allergy symptoms, such as antihistamines. These may be given as a shot, nasal spray, or pill.  Shots that are given until your body becomes less sensitive to the allergen (desensitization).  Stronger medicines, if all other treatments have not worked. Follow these instructions at home:  Avoiding allergens   Find out what you are allergic to. Common allergens include smoke, dust, and pollen.  Avoid them if you can. These are some of the things that you can do to avoid allergens: ? Replace carpet with wood, tile, or vinyl flooring. Carpet can trap dander and dust. ? Clean any mold found in the home. ? Do not smoke. Do not allow smoking in your home. ? Change your heating and air conditioning filter at least once a month. ? During allergy season:  Keep windows closed as much as you can. If possible, use air conditioning when there is a lot of pollen in the air.  Use a special filter for allergies with your furnace and air conditioner.  Plan outdoor activities when pollen counts are lowest. This is usually during the early morning or evening hours.  If you do go outdoors when pollen count is high, wear a special mask for people with allergies.  When you come indoors, take a shower and change your clothes before sitting on furniture or bedding. General instructions  Do not use fans in your home.  Do not hang clothes outside to dry.  Wear sunglasses to keep pollen out of your eyes.  Wash your hands right away after you touch household pets.  Take over-the-counter and prescription medicines only as told by your doctor.  Keep all follow-up visits as told by your doctor. This is important. Contact a doctor if:  You have a fever.  You have a cough  that does not go away (is persistent).  You start to make whistling sounds when you breathe (wheeze).  Your symptoms do not get better with treatment.  You have thick fluid coming from your nose.  You start to have nosebleeds. Get help right away if:  Your tongue or your lips are swollen.  You have trouble breathing.  You feel dizzy or you feel like you are going to pass out (faint).  You have cold sweats. Summary  Allergic rhinitis is a reaction to allergens in the air.  This condition may be caused by  allergens. These include pollen, dust mites, pet dander, and mold.  Symptoms include a runny, itchy nose, sneezing, or tearing eyes. You may also have trouble sleeping or feel sleepy during the day.  Treatment includes taking medicines and avoiding allergens. You may also get shots or take stronger medicines.  Get help if you have a fever or a cough that does not stop. Get help right away if you are short of breath. This information is not intended to replace advice given to you by your health care provider. Make sure you discuss any questions you have with your health care provider. Document Released: 05/24/2010 Document Revised: 08/13/2017 Document Reviewed: 08/13/2017 Elsevier Interactive Patient Education  2019 [employer].

## 2018-05-02 ENCOUNTER — Ambulatory Visit: Payer: Self-pay | Admitting: Family Medicine

## 2018-05-28 ENCOUNTER — Ambulatory Visit (INDEPENDENT_AMBULATORY_CARE_PROVIDER_SITE_OTHER): Payer: Self-pay | Admitting: Family Medicine

## 2018-05-28 ENCOUNTER — Encounter: Payer: Self-pay | Admitting: Family Medicine

## 2018-05-28 ENCOUNTER — Other Ambulatory Visit: Payer: Self-pay

## 2018-05-28 DIAGNOSIS — G43111 Migraine with aura, intractable, with status migrainosus: Secondary | ICD-10-CM

## 2018-05-28 MED ORDER — BUTALBITAL-APAP-CAFFEINE 50-325-40 MG PO TABS: 1.0000 | ORAL_TABLET | ORAL | 0 refills | Status: DC | PRN

## 2018-05-28 NOTE — Progress Notes (Signed)
Subjective:    Patient ID: Jerry [name], male    DOB: [birth year], 53 y.o.   MRN: [id number]   HPI: Jerry [name] is a 53 y.o. male presenting for head is "busting."Throbbing, pulsating. photo- and phonophobia.  Nauseated. Denies fever. No appetite. Onset this AM at work. This AM pain was 8/10. Now 4/10 resting in a dark quiet room. "They are real bad when I get them, but I don't get them often." Denies focal weakness and numbness. Gets aacramp in his right hand. Has history of seizure disorder.   Depression screen [facility] 2/9 10/30/2017 07/30/2017 07/30/2017 07/30/2017 06/12/2017  Decreased Interest 0 0 2 0 0  Down, Depressed, Hopeless 0 0 2 0 0  PHQ - 2 Score 0 0 4 0 0  Altered sleeping - 0 2 - -  Tired, decreased energy - 0 2 - -  Change in appetite - 0 0 - -  Feeling bad or failure about yourself  - 0 1 - -  Trouble concentrating - 0 1 - -  Moving slowly or fidgety/restless - 0 0 - -  Suicidal thoughts - 0 0 - -  PHQ-9 Score - 0 10 - -  Difficult doing work/chores - Not difficult at all Not difficult at all - -     Relevant past medical, surgical, family and social history reviewed and updated as indicated.  Interim medical history since our last visit reviewed. Allergies and medications reviewed and updated.  ROS:  Review of Systems   Social History   Tobacco Use  Smoking Status Former Smoker  . Packs/day: 0.50  . Last attempt to quit: 10/21/2014  . Years since quitting: 3.6  Smokeless Tobacco Never Used       Objective:     Wt Readings from Last 3 Encounters:  10/30/17 231 lb (104.8 kg)  07/30/17 220 lb 6.4 oz (100 kg)  06/13/17 223 lb 3.2 oz (101.2 kg)     Exam deferred. Pt. Harboring due to COVID 19. Phone visit performed.   Assessment & Plan:   1. Intractable migraine with aura with status migrainosus     Meds ordered this encounter  Medications  . butalbital-acetaminophen-caffeine (ESGIC) 50-325-40 MG tablet    Sig: Take 1 tablet by mouth every 4  (four) hours as needed for headache.    Dispense:  14 tablet    Refill:  0    No orders of the defined types were placed in this encounter.     Diagnoses and all orders for this visit:  Intractable migraine with aura with status migrainosus  Other orders -     butalbital-acetaminophen-caffeine (ESGIC) 50-325-40 MG tablet; Take 1 tablet by mouth every 4 (four) hours as needed for headache.    Virtual Visit via telephone Note  I discussed the limitations, risks, security and privacy concerns of performing an evaluation and management service by telephone and the availability of in person appointments. The patient was identified with two identifiers. Pt.expressed understanding and agreed to proceed. Pt. Is at home. Dr. Livia Snellen is in his office.  Follow Up Instructions:   I discussed the assessment and treatment plan with the patient. The patient was provided an opportunity to ask questions and all were answered. The patient agreed with the plan and demonstrated an understanding of the instructions.   The patient was advised to call back or seek an in-person evaluation if the symptoms worsen or if the condition fails to improve as anticipated.  Visit  started: 3:30 Call ended:  3:40 Total minutes including chart review and phone contact time: 17   Follow up plan: No follow-ups on file.  Claretta Fraise, MD [place]

## 2018-06-16 ENCOUNTER — Ambulatory Visit: Payer: BLUE CROSS/BLUE SHIELD | Admitting: Nurse Practitioner

## 2018-07-03 ENCOUNTER — Other Ambulatory Visit: Payer: Self-pay | Admitting: *Deleted

## 2018-07-03 ENCOUNTER — Telehealth: Payer: Self-pay | Admitting: Neurology

## 2018-07-03 MED ORDER — PHENYTOIN SODIUM EXTENDED 100 MG PO CAPS: 200.0000 mg | ORAL_CAPSULE | Freq: Two times a day (BID) | ORAL | 0 refills | Status: DC

## 2018-07-03 NOTE — Telephone Encounter (Signed)
The patient was last seen in May 2019 and is due for his yearly follow up.  I called him back to schedule an appt.  I was able to speak with his mother on [place].  I let her know we were sending in 30-day supply for him to the pharmacy today so that he does not run out of medication.  She will have him call back to schedule his appt.  When he calls back, please schedule a yearly follow up for him.  This will need to be a virtual visit.  Please provide him with an appt and send [email or web address] link.

## 2018-07-03 NOTE — Telephone Encounter (Signed)
Pt is needing a refill on his phenytoin (DILANTIN) 100 MG ER capsule sent to the [facility] in [place]

## 2018-07-22 ENCOUNTER — Telehealth: Payer: Self-pay | Admitting: Neurology

## 2018-07-22 NOTE — Telephone Encounter (Signed)
Patient's Depakote will be delivered 7-10 business days to [place].

## 2018-07-28 ENCOUNTER — Other Ambulatory Visit: Payer: Self-pay | Admitting: *Deleted

## 2018-07-28 MED ORDER — DEPAKOTE ER 500 MG PO TB24: ORAL_TABLET | ORAL | 3 refills | Status: DC

## 2018-07-28 NOTE — Telephone Encounter (Signed)
Letter Placed in Patients Bag.  Please call me when you are on your last bottle medication to allow me process refill delivery it takes 7-10 business days.  Thanks, Rance Muir [facility]  Patient assistance program  [phone]

## 2018-10-18 IMAGING — DX DG WRIST COMPLETE 3+V*R*
3 series · 3 of 3 positions shown · non-contrast
Comparison: None.

CLINICAL DATA: Pain and swelling in the right wrist for several
days, no injury

EXAM:
RIGHT WRIST - COMPLETE 3+ VIEW

[wrist ap]
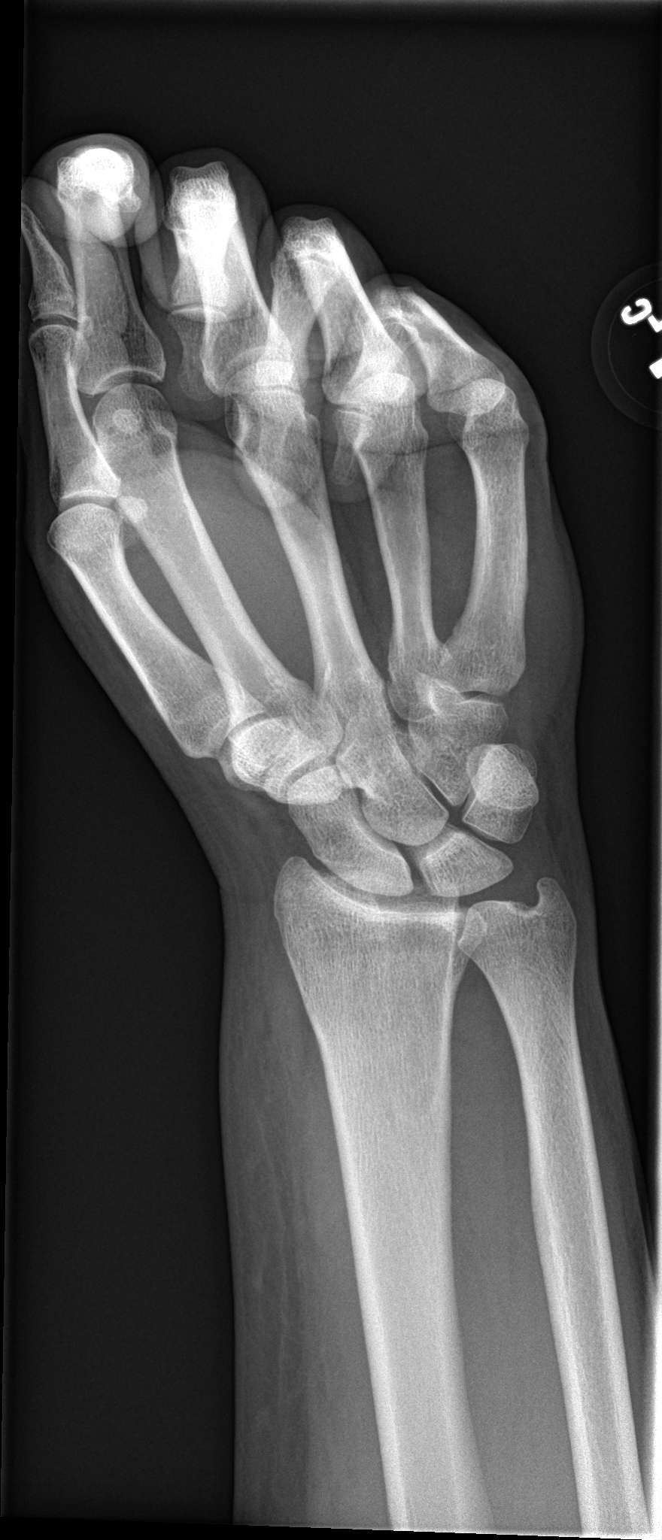

[wrist obl]
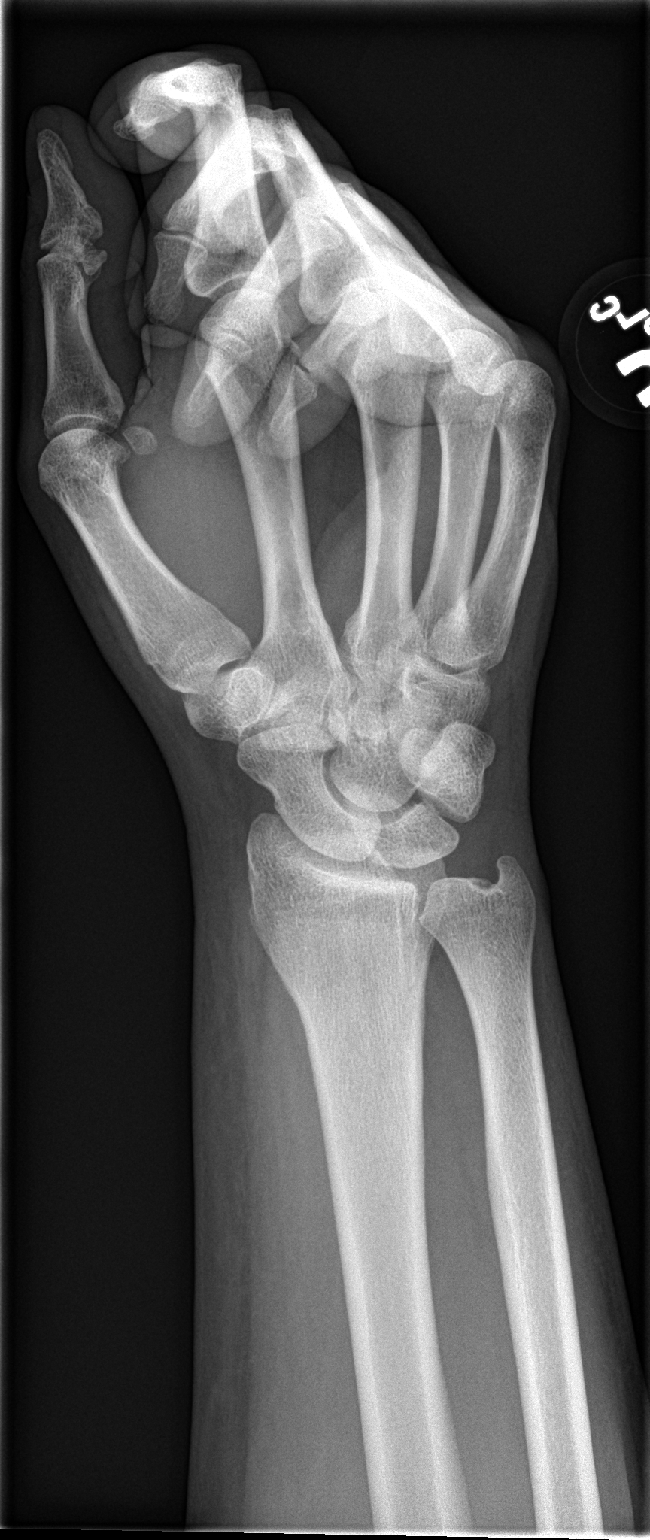

[wrist lat]
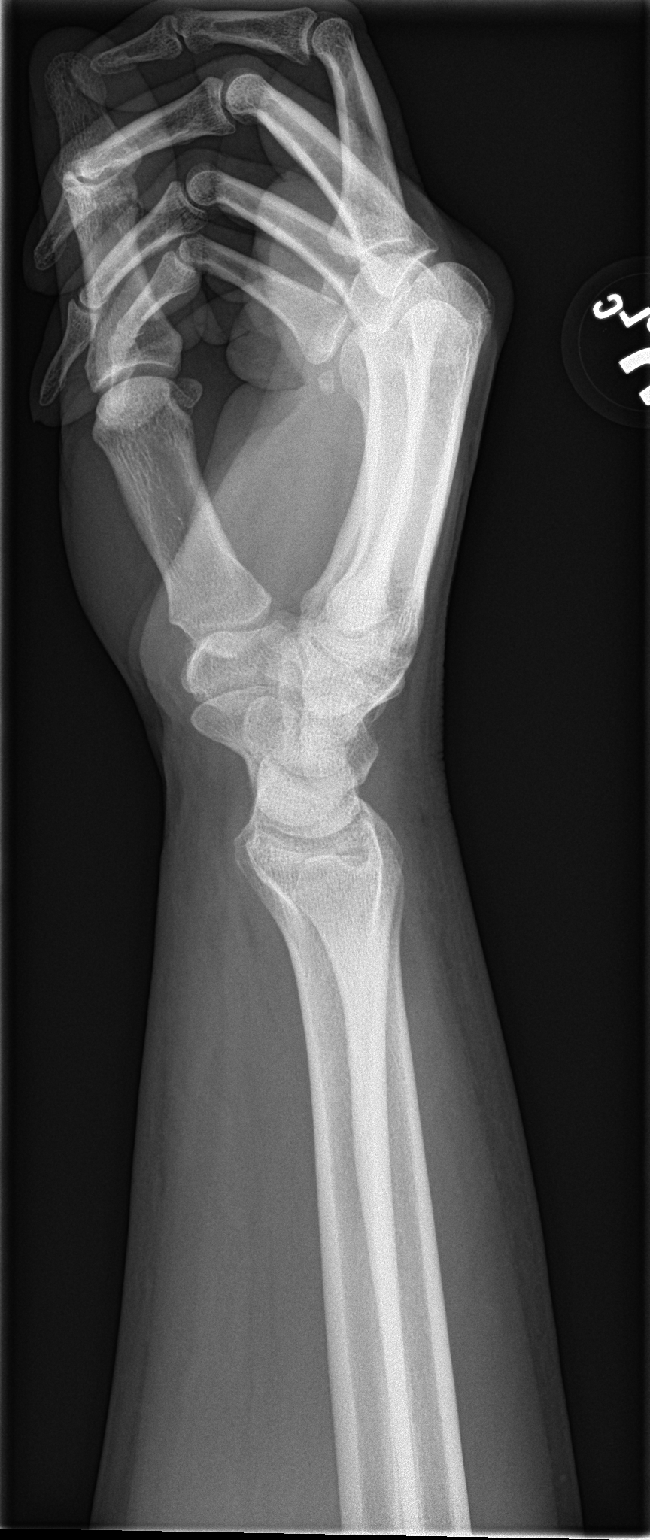

[3 of 3 positions shown; findings below may reference images not displayed]

FINDINGS: The right radiocarpal joint space appears normal, and the ulnar
styloid is intact. The carpal bones are normal position with normal
intercarpal joint spaces. Alignment is normal.
IMPRESSION: Negative.

## 2018-10-28 ENCOUNTER — Other Ambulatory Visit: Payer: Self-pay

## 2018-10-28 NOTE — Telephone Encounter (Signed)
Patient called stating that he has opened his last bottle of DEPAKOTE ER 500 MG 24 hr tablet. He stated that he needs a refill of this medication.

## 2018-10-28 NOTE — Telephone Encounter (Signed)
Noted, thank you. DW  °

## 2018-11-03 NOTE — Telephone Encounter (Signed)
Noted Information has been sent . For Depakote

## 2018-11-03 NOTE — Telephone Encounter (Signed)
Noted refill has been called and physician information has been changed as well because [name] is not here any more changed back to Dr. Krista Blue .

## 2018-11-10 ENCOUNTER — Telehealth: Payer: Self-pay | Admitting: Nurse Practitioner

## 2018-11-10 NOTE — Telephone Encounter (Signed)
Pt called in and stated that he has reached out to the patient assistance line and they stayed the Depakote was ship[ped out and he wil receive in 7-10 business days and to contact our office to request a weeks worth of Depakote to be sent to [place], [place] [address] until his order comes in.

## 2018-11-10 NOTE — Telephone Encounter (Signed)
Pt has called asking if his DEPAKOTE ER 500 MG 24 hr tablet was here.  Pt was given the [phone] to call for the status of his medication.  Pt states he is down to about 4 days worth of the medication. Pt will call the 800# for the status, this is FYI, no call back requested

## 2018-11-11 ENCOUNTER — Other Ambulatory Visit: Payer: Self-pay | Admitting: *Deleted

## 2018-11-11 MED ORDER — DEPAKOTE ER 500 MG PO TB24: ORAL_TABLET | ORAL | 0 refills | Status: DC

## 2018-11-11 NOTE — Telephone Encounter (Signed)
Patient's refill is not dew until today I will call in today. Left Patient a voice mail . [employer] opens at 9:00 am .

## 2018-11-11 NOTE — Telephone Encounter (Signed)
Patient Depakote is on the way.

## 2018-11-11 NOTE — Telephone Encounter (Signed)
Pt called stating that he is needing the generic of his DEPAKOTE ER 500 MG 24 hr tablet sent in not the brand name due to the price. Please advise.

## 2018-11-11 NOTE — Telephone Encounter (Signed)
Patient's RX is on the way Depakote.

## 2018-11-17 ENCOUNTER — Telehealth: Payer: Self-pay | Admitting: Neurology

## 2018-11-17 NOTE — Telephone Encounter (Signed)
Called patient and relayed that his Depakote ER was here from [place] Patient assistance program. 6 bottles came 500 mg 100 tablets in each bottle .    Letter Placed in Patients Bag.  Please call me when you are on your last bottle medication to allow me process refill delivery it takes 7-10 business days.  Thanks, Rance Muir [facility]  Patient assistance program  [phone]

## 2018-12-24 ENCOUNTER — Telehealth: Payer: Self-pay | Admitting: Neurology

## 2018-12-24 MED ORDER — DEPAKOTE ER 500 MG PO TB24: ORAL_TABLET | ORAL | 3 refills | Status: DC

## 2018-12-24 NOTE — Telephone Encounter (Signed)
Its time for patient assistance renewal please get Dr. Krista Blue to sign and Sharyn Lull will you attach hard copy RX. Thanks Hinton Dyer .   ID # Depakote [id number] [phone] Dina Rich

## 2018-12-24 NOTE — Telephone Encounter (Signed)
Prescription printed, signed by Dr. Krista Blue and returned to [facility] for patient assistance.

## 2018-12-24 NOTE — Telephone Encounter (Signed)
Faxed Thanks [facility] PAP . Depakote .

## 2018-12-24 NOTE — Addendum Note (Signed)
Addended by: Noberto Retort C on: 12/24/2018 02:40 PM   Modules accepted: Orders

## 2018-12-30 ENCOUNTER — Telehealth: Payer: Self-pay | Admitting: Neurology

## 2018-12-30 NOTE — Telephone Encounter (Signed)
Patient was approved for Depakote ER 500 mg  [facility] 12/25/2019 Telephone [phone] .

## 2019-01-05 NOTE — Telephone Encounter (Signed)
6 bottle's  came of Depakote 500 mg ER 100 tablets .

## 2019-01-06 ENCOUNTER — Telehealth: Payer: Self-pay | Admitting: Neurology

## 2019-01-06 NOTE — Telephone Encounter (Signed)
Pt called and was informed that the medication was at the [facility] ready to be picked up. Pt states he will pick it up possibly today.

## 2019-01-06 NOTE — Telephone Encounter (Signed)
I attempted to reach the patient.  His mother (on [place]) answered the phone.  The patient was last seen on 06/13/17 and instructed to come back for a revisit in one year.  He has been scheduled for his yearly follow up on 01/22/2019 with Butler Denmark, NP.  She is aware that any necessary routine lab work will be addressed at this appt.  Provided our number to call back with any questions.

## 2019-01-06 NOTE — Telephone Encounter (Signed)
Pt stopped by wanting to know if he could talk to someone about having his medication levels checked. Please advise.

## 2019-01-21 NOTE — Progress Notes (Signed)
PATIENT: Jerry [name] DOB: 1965/11/02  REASON FOR VISIT: follow up HISTORY FROM: patient  HISTORY OF PRESENT ILLNESS: Today 01/22/19  HISTORY HISTORY: Jerry [name] is a 53 year old right-handed Caucasian male, alone at visit, to followup his seizure disorder Last clinical visit was our office was in November 2013 with Jerry [name], he had a history of seizure disorder since subdural hematoma after a traumatic brain injury in 1984, previous abnormal EEG, he also had some anxiety disorder, he is no longer working. He lives with his mother, driving, last clinical seizure was September 2011, He has been on long-term combination therapy of Depakote ER 500 mg 2 tablets twice a day, high dose of Dilantin 100 mg 3 tablets twice a day, plus chewable 50 mg once a day He is getting assistance program for Depakote ER, coordinated through our office He is overall doing well, tolerating medications, no recurrent seizure  UPDATE 04/26/18[name] 53 year old male returns for follow-up. He has a history of seizure disorder and is currently on Depakote from  patient assistance and Dilantin 200 mg at night. Last seizure activity was 2011. He has had no new medical issues since last seen. He drives a car without difficulty. He returns for reevaluation and he needs labs to monitor any adverse effects of his seizure medications. UPDATE 5/9/2019[name], 53 year old male returns for follow-up with history of seizure disorder.  Last seizure occurred in September 2011.  Patient currently on Depakote 500mg  3 tabsTwice daily and Dilantin 100 mg 2 capsules twice daily.  No new medical issues since last seen.  He returns for reevaluation, labs and refills  Update January 22, 2019 [name] is a 53 year old male with history of seizure disorder.  He has patient assistance for Depakote.  He remains on Depakote ER 500 mg, 3 tablets twice daily, Dilantin 100 mg ER, 2 tablets twice a day.  He says  his last seizure was about 10 years ago.  He is tolerating his medications without side effect.  He denies any new problems or concerns.  He says he does have chronic ringing in his right ear, as result of hearing loss.  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  He denies any changes to his balance or gait.  He presents today for evaluation unaccompanied.  REVIEW OF SYSTEMS: Out of a complete 14 system review of symptoms, the patient complains only of the following symptoms, and all other reviewed systems are negative.  Seizures   ALLERGIES: No Known Allergies  HOME MEDICATIONS: Outpatient Medications Prior to Visit  Medication Sig Dispense Refill  . DEPAKOTE ER 500 MG 24 hr tablet 3 tabs po bid 540 tablet 3  . phenytoin (DILANTIN) 100 MG ER capsule Take 2 capsules (200 mg total) by mouth 2 (two) times daily. 120 capsule 0  . butalbital-acetaminophen-caffeine (ESGIC) 50-325-40 MG tablet Take 1 tablet by mouth every 4 (four) hours as needed for headache. (Patient not taking: Reported on 01/22/2019) 14 tablet 0  . fluticasone (FLONASE) 50 MCG/ACT nasal spray Place 2 sprays into both nostrils daily. (Patient not taking: Reported on 01/22/2019) 16 g 6  . pravastatin (PRAVACHOL) 40 MG tablet Take 1 tablet (40 mg total) by mouth daily. (Patient not taking: Reported on 01/22/2019) 90 tablet 1  . triamcinolone ointment (KENALOG) 0.5 % Apply 1 application topically 2 (two) times daily as needed. (Patient not taking: Reported on 10/30/2017) 60 g 1   No facility-administered medications  prior to visit.    PAST MEDICAL HISTORY: Past Medical History:  Diagnosis Date  . Cancer ([place])    skin  . Encounter for long-term (current) use of other medications   . Head injury   . Seizure ([place])   . Varicose veins   . Vasculitis ([place]) 11/2014    PAST SURGICAL HISTORY: Past Surgical History:  Procedure Laterality Date  . plate on head    . TOE INTERNAL FIXATION W/  COMPRESSION SCREW      FAMILY HISTORY: Family History  Problem Relation Age of Onset  . Diabetes Father     SOCIAL HISTORY: Social History   Socioeconomic History  . Marital status: Married    Spouse name: Not on file  . Number of children: 0  . Years of education: 46  . Highest education level: Not on file  Occupational History    Comment: Not working  Tobacco Use  . Smoking status: Former Smoker    Packs/day: 0.50    Quit date: 10/21/2014    Years since quitting: 4.2  . Smokeless tobacco: Never Used  Substance and Sexual Activity  . Alcohol use: No    Alcohol/week: 0.0 standard drinks  . Drug use: No  . Sexual activity: Not on file  Other Topics Concern  . Not on file  Social History Narrative   Patient is currently not [place] school education some college. Patient  Is single and lives with his mother.    Right handed.   Caffeine one cup daily.   Social Determinants of Health   Financial Resource Strain:   . Difficulty of Paying Living Expenses: Not on file  Food Insecurity:   . Worried About Charity fundraiser in the Last Year: Not on file  . Ran Out of Food in the Last Year: Not on file  Transportation Needs:   . Lack of Transportation (Medical): Not on file  . Lack of Transportation (Non-Medical): Not on file  Physical Activity:   . Days of Exercise per Week: Not on file  . Minutes of Exercise per Session: Not on file  Stress:   . Feeling of Stress : Not on file  Social Connections:   . Frequency of Communication with Friends and Family: Not on file  . Frequency of Social Gatherings with Friends and Family: Not on file  . Attends Religious Services: Not on file  . Active Member of Clubs or Organizations: Not on file  . Attends Archivist Meetings: Not on file  . Marital Status: Not on file  Intimate Partner Violence:   . Fear of Current or Ex-Partner: Not on file  . Emotionally Abused: Not on file  . Physically Abused: Not on file   . Sexually Abused: Not on file   PHYSICAL EXAM  Vitals:   01/22/19 1409  BP: 139/83  Pulse: 78  Temp: 97.8 F (36.6 C)  Weight: 240 lb 12.8 oz (109.2 kg)  Height: 5\' 10"  (1.778 m)   Body mass index is 34.55 kg/m.  Generalized: Well developed, in no acute distress   Neurological examination  Mentation: Alert oriented to time, place, history taking. Follows all commands speech and language fluent Cranial nerve II-XII: Pupils were equal round reactive to light. Extraocular movements were full, visual field were full on confrontational test. Facial sensation and strength were normal. Head turning and shoulder shrug  were normal and symmetric. Motor: The motor testing reveals 5 over 5 strength of all 4 extremities. Good symmetric  motor tone is noted throughout.  Sensory: Sensory testing is intact to soft touch on all 4 extremities. No evidence of extinction is noted.  Coordination: Cerebellar testing reveals good finger-nose-finger and heel-to-shin bilaterally.  Gait and station: Gait is normal. Tandem gait is normal.   Reflexes: Deep tendon reflexes are symmetric and normal bilaterally.   DIAGNOSTIC DATA (LABS, IMAGING, TESTING) - I reviewed patient records, labs, notes, testing and imaging myself where available.  Lab Results  Component Value Date   WBC 8.7 10/30/2017   HGB 13.6 10/30/2017   HCT 39.1 10/30/2017   MCV 95 10/30/2017   PLT 228 10/30/2017      Component Value Date/Time   NA 143 10/30/2017 0909   K 4.0 10/30/2017 0909   CL 103 10/30/2017 0909   CO2 24 10/30/2017 0909   GLUCOSE 81 10/30/2017 0909   BUN 13 10/30/2017 0909   CREATININE 1.05 10/30/2017 0909   CALCIUM 9.1 10/30/2017 0909   PROT 7.1 10/30/2017 0909   ALBUMIN 4.3 10/30/2017 0909   AST 32 10/30/2017 0909   ALT 28 10/30/2017 0909   ALKPHOS 77 10/30/2017 0909   BILITOT 0.3 10/30/2017 0909   GFRNONAA 82 10/30/2017 0909   GFRAA 95 10/30/2017 0909   Lab Results  Component Value Date   CHOL 206  (H) 04/30/2017   HDL 36 (L) 04/30/2017   LDLCALC 118 (H) 04/30/2017   TRIG 259 (H) 04/30/2017   CHOLHDL 5.7 (H) 04/30/2017   No results found for: HGBA1C No results found for: VITAMINB12 Lab Results  Component Value Date   TSH 2.070 03/19/2017      ASSESSMENT AND PLAN 53 y.o. year old male  has a past medical history of Cancer ([place]), Encounter for long-term (current) use of other medications, Head injury, Seizure ([place]), Varicose veins, and Vasculitis ([place]) (11/2014). here with:  1. Seizures  -No seizure in 10 years, doing well, requires BRAND name Depakote on patient assistance, generic dilantin  -Continue Depakote ER 500 mg, 3 tablets 2 times daily -Continue Dilantin 100 mg capsule, 2 capsules twice daily (refill sent) -I will check routine lab work today including a Dilantin, Depakote level -He will call for recurrent seizure, follow-up in 1 year or sooner if needed  I spent 15 minutes with the patient. 50% of this time was spent discussing his plan of care.  Butler Denmark, AGNP-C, DNP 01/22/2019, 2:20 PM [facility] [address] [place], [place] [address] [phone]

## 2019-01-22 ENCOUNTER — Encounter: Payer: Self-pay | Admitting: Neurology

## 2019-01-22 ENCOUNTER — Ambulatory Visit: Payer: Self-pay | Admitting: Neurology

## 2019-01-22 ENCOUNTER — Other Ambulatory Visit: Payer: Self-pay

## 2019-01-22 VITALS — BP 139/83 | HR 78 | Temp 97.8°F | Ht 70.0 in | Wt 240.8 lb

## 2019-01-22 DIAGNOSIS — G40909 Epilepsy, unspecified, not intractable, without status epilepticus: Secondary | ICD-10-CM

## 2019-01-22 MED ORDER — PHENYTOIN SODIUM EXTENDED 100 MG PO CAPS: 200.0000 mg | ORAL_CAPSULE | Freq: Two times a day (BID) | ORAL | 11 refills | Status: DC

## 2019-01-22 NOTE — Patient Instructions (Signed)
Continue current medications   I will check lab work   Call for recurrent seizure  Follow-up in 1 year or sooner if needed

## 2019-01-23 LAB — VALPROIC ACID LEVEL: Valproic Acid Lvl: 99 ug/mL (ref 50–100)

## 2019-01-23 LAB — COMPREHENSIVE METABOLIC PANEL
ALT: 23 IU/L (ref 0–44)
AST: 19 IU/L (ref 0–40)
Albumin/Globulin Ratio: 1.8 (ref 1.2–2.2)
Albumin: 4.3 g/dL (ref 3.8–4.9)
Alkaline Phosphatase: 81 IU/L (ref 39–117)
BUN/Creatinine Ratio: 9 (ref 9–20)
BUN: 8 mg/dL (ref 6–24)
Bilirubin Total: 0.2 mg/dL (ref 0.0–1.2)
CO2: 24 mmol/L (ref 20–29)
Calcium: 9.5 mg/dL (ref 8.7–10.2)
Chloride: 104 mmol/L (ref 96–106)
Creatinine, Ser: 0.92 mg/dL (ref 0.76–1.27)
GFR calc Af Amer: 110 mL/min/{1.73_m2} (ref >59)
GFR calc non Af Amer: 95 mL/min/{1.73_m2} (ref >59)
Globulin, Total: 2.4 g/dL (ref 1.5–4.5)
Glucose: 79 mg/dL (ref 65–99)
Potassium: 4.2 mmol/L (ref 3.5–5.2)
Sodium: 143 mmol/L (ref 134–144)
Total Protein: 6.7 g/dL (ref 6.0–8.5)

## 2019-01-23 LAB — CBC WITH DIFFERENTIAL/PLATELET
Basophils Absolute: 0.1 10*3/uL (ref 0.0–0.2)
Basos: 1 %
EOS (ABSOLUTE): 0.2 10*3/uL (ref 0.0–0.4)
Eos: 2 %
Hematocrit: 42 % (ref 37.5–51.0)
Hemoglobin: 14.7 g/dL (ref 13.0–17.7)
Immature Grans (Abs): 0.1 10*3/uL (ref 0.0–0.1)
Immature Granulocytes: 2 %
Lymphocytes Absolute: 2.5 10*3/uL (ref 0.7–3.1)
Lymphs: 29 %
MCH: 33.2 pg — ABNORMAL HIGH (ref 26.6–33.0)
MCHC: 35 g/dL (ref 31.5–35.7)
MCV: 95 fL (ref 79–97)
Monocytes Absolute: 1 10*3/uL — ABNORMAL HIGH (ref 0.1–0.9)
Monocytes: 12 %
Neutrophils Absolute: 4.7 10*3/uL (ref 1.4–7.0)
Neutrophils: 54 %
Platelets: 250 10*3/uL (ref 150–450)
RBC: 4.43 x10E6/uL (ref 4.14–5.80)
RDW: 12.8 % (ref 11.6–15.4)
WBC: 8.7 10*3/uL (ref 3.4–10.8)

## 2019-01-23 LAB — PHENYTOIN LEVEL, TOTAL: Phenytoin (Dilantin), Serum: 1.5 ug/mL — ABNORMAL LOW (ref 10.0–20.0)

## 2019-01-27 ENCOUNTER — Telehealth: Payer: Self-pay

## 2019-01-27 NOTE — Telephone Encounter (Signed)
Pt returned call, information provided, please call with more instructions if needed

## 2019-01-27 NOTE — Telephone Encounter (Signed)
Called Pt on behalf of NP Butler Denmark, with results on recent labs: CBC with Differential/Platelet CMP,Phenytoin Level, Valproic Acid Level  Answered by a lady, however pt doesn't not have a DPR on file.   Results:  "Please call the patient. CBC, CMP looks good. Dilantin level is low, but has been at stable level. Ensure taking medication, won't alter dosing since seizure free awhile. Depakote level is therapeutic." -NP SS

## 2019-04-08 NOTE — Progress Notes (Signed)
I have reviewed and agreed above plan.  If he continues to do well, seizure-free, may consider taper off Dilantin, monotherapy for his seizure.

## 2019-05-20 ENCOUNTER — Telehealth: Payer: Self-pay | Admitting: Neurology

## 2019-05-20 NOTE — Telephone Encounter (Signed)
ID # Depakote [id number] [phone] - Jerry [name]   RX will be here in 7-10 business days I will call patient when RX is here .

## 2019-05-25 ENCOUNTER — Telehealth: Payer: Self-pay | Admitting: Neurology

## 2019-05-25 NOTE — Telephone Encounter (Signed)
Called patient's mother and relayed Depakote ER 500 mg 100 tablets  In each bottle  6 bottles came.   Medication at the front desk for him to pick up.

## 2019-05-25 NOTE — Telephone Encounter (Signed)
Letter Placed in Patient's Bag.  Please call me when you are on your last bottle medication to allow me process refill delivery it takes 7-10 business days.  Thanks, Rance Muir [facility]  Patient assistance program  [phone]

## 2019-07-24 DIAGNOSIS — Z6831 Body mass index (BMI) 31.0-31.9, adult: Secondary | ICD-10-CM | POA: Diagnosis not present

## 2019-07-24 DIAGNOSIS — H6123 Impacted cerumen, bilateral: Secondary | ICD-10-CM | POA: Diagnosis not present

## 2019-07-24 DIAGNOSIS — H6691 Otitis media, unspecified, right ear: Secondary | ICD-10-CM | POA: Diagnosis not present

## 2019-07-24 DIAGNOSIS — H9313 Tinnitus, bilateral: Secondary | ICD-10-CM | POA: Diagnosis not present

## 2019-08-17 ENCOUNTER — Telehealth: Payer: Self-pay | Admitting: Neurology

## 2019-08-17 NOTE — Telephone Encounter (Signed)
Called PAP and patient's medicine will be here in 7-10 business days conformation [phone] spoke to [facility]  Depakote ER 500

## 2019-08-25 NOTE — Telephone Encounter (Signed)
08/25/2019 Called patient relayed to him Depakote was ready to be picked up at the front desk. Depakote ER 500 mg 5 bottles came 100 tablets in each bottle.   Letter Placed in Patient's Bag.  Please call me when you are on your last bottle medication to allow me process refill delivery it takes 7-10 business days.  Thanks, Rance Muir [facility]  Patient assistance program  [phone]

## 2019-10-22 ENCOUNTER — Encounter: Payer: Self-pay | Admitting: Family Medicine

## 2019-10-22 ENCOUNTER — Ambulatory Visit: Payer: BC Managed Care – PPO | Admitting: Family Medicine

## 2019-10-22 ENCOUNTER — Ambulatory Visit: Payer: Self-pay | Admitting: Family Medicine

## 2019-10-22 ENCOUNTER — Other Ambulatory Visit: Payer: Self-pay

## 2019-10-22 VITALS — BP 134/79 | Temp 98.0°F | Ht 70.0 in | Wt 230.0 lb

## 2019-10-22 DIAGNOSIS — I8392 Asymptomatic varicose veins of left lower extremity: Secondary | ICD-10-CM

## 2019-10-22 NOTE — Progress Notes (Signed)
BP 134/79   Temp 98 F (36.7 C)   Ht 5\' 10"  (1.778 m)   Wt 230 lb (104.3 kg)   BMI 33.00 kg/m    Subjective:   Patient ID: Jerry [name], male    DOB: 25-Nov-1965, 54 y.o.   MRN: [id number]  HPI: Jerry [name] is a 54 y.o. male presenting on 10/22/2019 for Varicose Veins (LLE, getting larger, no pain.)   HPI Patient is coming in for swelling in the left leg and bulging where he is having the varicose veins.  He denies any pain with them.  He does admit he works 12-hour shifts on his feet on concrete and been walking around he is getting more swelling in that leg because of these varicose veins and wants to know if there is anything he can do for them.  He denies any redness or swelling.  The swelling that he gets sometimes goes down during the day but only goes up at work but it is not swollen today.  Relevant past medical, surgical, family and social history reviewed and updated as indicated. Interim medical history since our last visit reviewed. Allergies and medications reviewed and updated.  Review of Systems  Constitutional: Negative for chills and fever.  Eyes: Negative for visual disturbance.  Respiratory: Negative for shortness of breath and wheezing.   Cardiovascular: Negative for chest pain and leg swelling.  Musculoskeletal: Negative for back pain and gait problem.  Skin: Negative for color change, rash and wound.  All other systems reviewed and are negative.  Per HPI unless specifically indicated above  Objective:   BP 134/79   Temp 98 F (36.7 C)   Ht 5\' 10"  (1.778 m)   Wt 230 lb (104.3 kg)   BMI 33.00 kg/m   Wt Readings from Last 3 Encounters:  10/22/19 230 lb (104.3 kg)  01/22/19 240 lb 12.8 oz (109.2 kg)  10/30/17 231 lb (104.8 kg)    Physical Exam Vitals and nursing note reviewed.  Constitutional:      General: He is not in acute distress.    Appearance: He is well-developed. He is not diaphoretic.  Eyes:     General: No scleral icterus.     Conjunctiva/sclera: Conjunctivae normal.  Neck:     Thyroid: No thyromegaly.  Cardiovascular:     Rate and Rhythm: Normal rate and regular rhythm.     Heart sounds: Normal heart sounds. No murmur heard.   Pulmonary:     Effort: Pulmonary effort is normal. No respiratory distress.     Breath sounds: Normal breath sounds. No wheezing.  Musculoskeletal:        General: Normal range of motion.     Cervical back: Neck supple.  Lymphadenopathy:     Cervical: No cervical adenopathy.  Skin:    General: Skin is warm and dry.     Findings: No rash.  Neurological:     Mental Status: He is alert and oriented to person, place, and time.     Coordination: Coordination normal.  Psychiatric:        Behavior: Behavior normal.     Assessment & Plan:   Problem List Items Addressed This Visit    None    Visit Diagnoses    Varicose veins of left lower extremity, unspecified whether complicated    -  Primary      Gave patient reassurance and recommended that he use compression stockings. Follow up plan: Return if symptoms worsen or fail to  improve.  Counseling provided for all of the vaccine components No orders of the defined types were placed in this encounter.   Caryl Pina, MD [facility] 10/22/2019, 4:28 PM

## 2019-10-23 ENCOUNTER — Telehealth: Payer: Self-pay | Admitting: Family Medicine

## 2019-10-23 NOTE — Telephone Encounter (Signed)
Appt made.  Patient aware  

## 2019-10-23 NOTE — Telephone Encounter (Signed)
Yes

## 2019-11-03 ENCOUNTER — Encounter: Payer: Self-pay | Admitting: *Deleted

## 2019-11-18 DIAGNOSIS — Z23 Encounter for immunization: Secondary | ICD-10-CM | POA: Diagnosis not present

## 2019-12-01 ENCOUNTER — Encounter: Payer: BC Managed Care – PPO | Admitting: Family Medicine

## 2019-12-07 ENCOUNTER — Other Ambulatory Visit: Payer: Self-pay

## 2019-12-07 ENCOUNTER — Ambulatory Visit (INDEPENDENT_AMBULATORY_CARE_PROVIDER_SITE_OTHER): Payer: BC Managed Care – PPO | Admitting: Family Medicine

## 2019-12-07 ENCOUNTER — Encounter: Payer: Self-pay | Admitting: Family Medicine

## 2019-12-07 VITALS — BP 131/83 | HR 63 | Temp 97.1°F | Resp 20 | Ht 70.0 in | Wt 231.4 lb

## 2019-12-07 DIAGNOSIS — Z1159 Encounter for screening for other viral diseases: Secondary | ICD-10-CM

## 2019-12-07 DIAGNOSIS — Z1211 Encounter for screening for malignant neoplasm of colon: Secondary | ICD-10-CM

## 2019-12-07 DIAGNOSIS — Z0000 Encounter for general adult medical examination without abnormal findings: Secondary | ICD-10-CM | POA: Diagnosis not present

## 2019-12-07 DIAGNOSIS — Z125 Encounter for screening for malignant neoplasm of prostate: Secondary | ICD-10-CM | POA: Diagnosis not present

## 2019-12-07 DIAGNOSIS — R300 Dysuria: Secondary | ICD-10-CM | POA: Diagnosis not present

## 2019-12-07 DIAGNOSIS — Z114 Encounter for screening for human immunodeficiency virus [HIV]: Secondary | ICD-10-CM | POA: Diagnosis not present

## 2019-12-07 DIAGNOSIS — E782 Mixed hyperlipidemia: Secondary | ICD-10-CM

## 2019-12-07 DIAGNOSIS — Z0001 Encounter for general adult medical examination with abnormal findings: Secondary | ICD-10-CM | POA: Diagnosis not present

## 2019-12-07 DIAGNOSIS — I878 Other specified disorders of veins: Secondary | ICD-10-CM

## 2019-12-07 DIAGNOSIS — E559 Vitamin D deficiency, unspecified: Secondary | ICD-10-CM

## 2019-12-07 DIAGNOSIS — G40909 Epilepsy, unspecified, not intractable, without status epilepticus: Secondary | ICD-10-CM

## 2019-12-07 LAB — URINALYSIS, COMPLETE
Bilirubin, UA: NEGATIVE
Glucose, UA: NEGATIVE
Nitrite, UA: NEGATIVE
Protein,UA: NEGATIVE
RBC, UA: NEGATIVE
Specific Gravity, UA: >1.03 — ABNORMAL HIGH (ref 1.005–1.030)
Urobilinogen, Ur: 0.2 mg/dL (ref 0.2–1.0)
pH, UA: 5.5 (ref 5.0–7.5)

## 2019-12-07 LAB — MICROSCOPIC EXAMINATION: RBC, Urine: NONE SEEN /hpf (ref 0–2)

## 2019-12-07 NOTE — Progress Notes (Addendum)
Subjective:  Patient ID: Jerry [name], male    DOB: 1966-01-03  Age: 54 y.o. MRN: [id number]  CC: Annual Exam and Dysuria   HPI Jerry [name] presents for CPE. He is treated for a seizure disorder but has not had any seizure activity in greater than a decade.  He has a history of venous stasis, hyperlipidemia and skin cancer.    Depression screen [facility] 2/9 10/22/2019 10/30/2017 07/30/2017  Decreased Interest 0 0 0  Down, Depressed, Hopeless 0 0 0  PHQ - 2 Score 0 0 0  Altered sleeping - - 0  Tired, decreased energy - - 0  Change in appetite - - 0  Feeling bad or failure about yourself  - - 0  Trouble concentrating - - 0  Moving slowly or fidgety/restless - - 0  Suicidal thoughts - - 0  PHQ-9 Score - - 0  Difficult doing work/chores - - Not difficult at all    History Jerry [name] has a past medical history of Cancer ([place]), Encounter for long-term (current) use of other medications, Head injury, Seizure ([place]), Varicose veins, and Vasculitis ([place]) (11/2014).   He has a past surgical history that includes Toe internal fixation w/ compression screw and plate on head.   His family history includes Diabetes in his father.He reports that he quit smoking about 5 years ago. He smoked 0.50 packs per day. He has never used smokeless tobacco. He reports that he does not drink alcohol and does not use drugs.    ROS Review of Systems  Constitutional: Negative for activity change, fatigue and unexpected weight change.  HENT: Negative for congestion, ear pain, hearing loss, postnasal drip and trouble swallowing.   Eyes: Negative for pain and visual disturbance.  Respiratory: Negative for cough, chest tightness and shortness of breath.   Cardiovascular: Negative for chest pain, palpitations and leg swelling.  Gastrointestinal: Positive for diarrhea (2 loose bowel movements a day. ). Negative for abdominal distention, abdominal pain, blood in stool, constipation, nausea and vomiting.    Endocrine: Negative for cold intolerance, heat intolerance and polydipsia.  Genitourinary: Positive for dysuria. Negative for difficulty urinating, flank pain, frequency and urgency.  Musculoskeletal: Positive for arthralgias (left elbow). Negative for joint swelling.  Skin: Negative for color change, rash and wound.  Neurological: Negative for dizziness, syncope, speech difficulty, weakness, light-headedness, numbness and headaches.  Hematological: Does not bruise/bleed easily.  Psychiatric/Behavioral: Negative for confusion, decreased concentration, dysphoric mood and sleep disturbance. The patient is not nervous/anxious.     Objective:  BP 131/83   Pulse 63   Temp (!) 97.1 F (36.2 C) (Temporal)   Resp 20   Ht _0  (1.778 m)   Wt 231 lb 6 oz (105 kg)   SpO2 96%   BMI 33.20 kg/m   BP Readings from Last 3 Encounters:  12/07/19 131/83  10/22/19 134/79  01/22/19 139/83    Wt Readings from Last 3 Encounters:  12/07/19 231 lb 6 oz (105 kg)  10/22/19 230 lb (104.3 kg)  01/22/19 240 lb 12.8 oz (109.2 kg)     Physical Exam Constitutional:      Appearance: He is well-developed.  HENT:     Head: Normocephalic and atraumatic.  Eyes:     Pupils: Pupils are equal, round, and reactive to light.  Neck:     Thyroid: No thyromegaly.     Trachea: No tracheal deviation.  Cardiovascular:     Rate and Rhythm: Normal rate and regular rhythm.  Heart sounds: Normal heart sounds. No murmur heard.  No friction rub. No gallop.      Comments: Multiple varicosities, ropy on the left to medial thigh Pulmonary:     Breath sounds: Normal breath sounds. No wheezing or rales.  Abdominal:     General: Bowel sounds are normal. There is no distension.     Palpations: Abdomen is soft. There is no mass.     Tenderness: There is no abdominal tenderness.     Hernia: There is no hernia in the left inguinal area.  Genitourinary:    Penis: Normal.      Testes: Normal.  Musculoskeletal:         General: Normal range of motion.     Cervical back: Normal range of motion.  Lymphadenopathy:     Cervical: No cervical adenopathy.  Skin:    General: Skin is warm and dry.  Neurological:     Mental Status: He is alert and oriented to person, place, and time.       Assessment & Plan:   Jerry [name] was seen today for annual exam and dysuria.  Diagnoses and all orders for this visit:  Healthcare maintenance -     CBC with Differential/Platelet -     CMP14+EGFR  Dysuria -     Urinalysis, Complete -     CMP14+EGFR -     Urine Culture -     Microscopic Examination  Seizure disorder (HCC) -     CMP14+EGFR  Venous stasis -     CMP14+EGFR -     Hepatitis C antibody  Mixed hyperlipidemia -     CMP14+EGFR -     Lipid panel  Screen for colon cancer -     CMP14+EGFR -     Ambulatory referral to Gastroenterology  Screening for prostate cancer -     CMP14+EGFR -     PSA Total (Reflex To Free)  Encounter for screening for HIV -     CMP14+EGFR -     HIV Antibody (routine testing w rflx)  Need for hepatitis C screening test -     CMP14+EGFR -     Hepatitis C antibody  Vitamin D deficiency -     VITAMIN D 25 Hydroxy (Vit-D Deficiency, Fractures)       I am having Jerry [name] maintain his Depakote ER and phenytoin.  Allergies as of 12/07/2019   No Known Allergies     Medication List       Accurate as of December 07, 2019  9:45 PM. If you have any questions, ask your nurse or doctor.        Depakote ER 500 MG 24 hr tablet Generic drug: divalproex 3 tabs po bid   phenytoin 100 MG ER capsule Commonly known as: DILANTIN Take 2 capsules (200 mg total) by mouth 2 (two) times daily. What changed: when to take this        Follow-up: Return in about 3 months (around 03/08/2020).  Claretta Fraise, M.D.

## 2019-12-08 ENCOUNTER — Other Ambulatory Visit: Payer: Self-pay | Admitting: Family Medicine

## 2019-12-08 DIAGNOSIS — R972 Elevated prostate specific antigen [PSA]: Secondary | ICD-10-CM

## 2019-12-08 LAB — PSA TOTAL (REFLEX TO FREE): Prostate Specific Ag, Serum: 4.2 ng/mL — ABNORMAL HIGH (ref 0.0–4.0)

## 2019-12-08 LAB — CBC WITH DIFFERENTIAL/PLATELET
Basophils Absolute: 0.1 10*3/uL (ref 0.0–0.2)
Basos: 1 %
EOS (ABSOLUTE): 0.2 10*3/uL (ref 0.0–0.4)
Eos: 2 %
Hematocrit: 44.4 % (ref 37.5–51.0)
Hemoglobin: 15.5 g/dL (ref 13.0–17.7)
Immature Grans (Abs): 0.2 10*3/uL — ABNORMAL HIGH (ref 0.0–0.1)
Immature Granulocytes: 2 %
Lymphocytes Absolute: 2.7 10*3/uL (ref 0.7–3.1)
Lymphs: 29 %
MCH: 33.3 pg — ABNORMAL HIGH (ref 26.6–33.0)
MCHC: 34.9 g/dL (ref 31.5–35.7)
MCV: 95 fL (ref 79–97)
Monocytes Absolute: 0.9 10*3/uL (ref 0.1–0.9)
Monocytes: 10 %
Neutrophils Absolute: 5.1 10*3/uL (ref 1.4–7.0)
Neutrophils: 56 %
Platelets: 263 10*3/uL (ref 150–450)
RBC: 4.66 x10E6/uL (ref 4.14–5.80)
RDW: 13 % (ref 11.6–15.4)
WBC: 9.2 10*3/uL (ref 3.4–10.8)

## 2019-12-08 LAB — LIPID PANEL
Chol/HDL Ratio: 7.1 ratio — ABNORMAL HIGH (ref 0.0–5.0)
Cholesterol, Total: 254 mg/dL — ABNORMAL HIGH (ref 100–199)
HDL: 36 mg/dL — ABNORMAL LOW (ref >39)
LDL Chol Calc (NIH): 172 mg/dL — ABNORMAL HIGH (ref 0–99)
Triglycerides: 245 mg/dL — ABNORMAL HIGH (ref 0–149)
VLDL Cholesterol Cal: 46 mg/dL — ABNORMAL HIGH (ref 5–40)

## 2019-12-08 LAB — FPSA% REFLEX
% FREE PSA: 21.9 %
PSA, FREE: 0.92 ng/mL

## 2019-12-08 LAB — CMP14+EGFR
ALT: 23 IU/L (ref 0–44)
AST: 22 IU/L (ref 0–40)
Albumin/Globulin Ratio: 1.6 (ref 1.2–2.2)
Albumin: 4.4 g/dL (ref 3.8–4.9)
Alkaline Phosphatase: 85 IU/L (ref 44–121)
BUN/Creatinine Ratio: 11 (ref 9–20)
BUN: 10 mg/dL (ref 6–24)
Bilirubin Total: 0.2 mg/dL (ref 0.0–1.2)
CO2: 22 mmol/L (ref 20–29)
Calcium: 9.4 mg/dL (ref 8.7–10.2)
Chloride: 103 mmol/L (ref 96–106)
Creatinine, Ser: 0.95 mg/dL (ref 0.76–1.27)
GFR calc Af Amer: 105 mL/min/{1.73_m2} (ref >59)
GFR calc non Af Amer: 91 mL/min/{1.73_m2} (ref >59)
Globulin, Total: 2.8 g/dL (ref 1.5–4.5)
Glucose: 87 mg/dL (ref 65–99)
Potassium: 4.1 mmol/L (ref 3.5–5.2)
Sodium: 143 mmol/L (ref 134–144)
Total Protein: 7.2 g/dL (ref 6.0–8.5)

## 2019-12-08 LAB — HIV ANTIBODY (ROUTINE TESTING W REFLEX): HIV Screen 4th Generation wRfx: NONREACTIVE

## 2019-12-08 LAB — VITAMIN D 25 HYDROXY (VIT D DEFICIENCY, FRACTURES): Vit D, 25-Hydroxy: 12.7 ng/mL — ABNORMAL LOW (ref 30.0–100.0)

## 2019-12-08 LAB — HEPATITIS C ANTIBODY: Hep C Virus Ab: <0.1 s/co ratio (ref 0.0–0.9)

## 2019-12-08 MED ORDER — VITAMIN D (ERGOCALCIFEROL) 1.25 MG (50000 UNIT) PO CAPS
50000.0000 [IU] | ORAL_CAPSULE | ORAL | 3 refills | Status: AC
Start: 2019-12-08 — End: 2020-12-06

## 2019-12-08 MED ORDER — ROSUVASTATIN CALCIUM 10 MG PO TABS: 10.0000 mg | ORAL_TABLET | Freq: Every day | ORAL | 1 refills | Status: DC

## 2019-12-09 LAB — URINE CULTURE: Organism ID, Bacteria: NO GROWTH

## 2019-12-15 ENCOUNTER — Encounter: Payer: Self-pay | Admitting: Internal Medicine

## 2019-12-16 ENCOUNTER — Telehealth: Payer: Self-pay | Admitting: Neurology

## 2019-12-16 NOTE — Telephone Encounter (Signed)
Called and left message with patient's mom and relayed patient's medication was her and ready for pick up . Depakote Er - 500 mg 100 tablets in each bottle 6 bottles came . We also re new patient for the following year .   Letter Placed in Patients Bag.  Please call me when you are on your last bottle medication to allow me process refill delivery it takes 7-10 business days.  Thanks, Rance Muir [facility]  Patient assistance program  [phone]

## 2019-12-21 DIAGNOSIS — M9904 Segmental and somatic dysfunction of sacral region: Secondary | ICD-10-CM | POA: Diagnosis not present

## 2019-12-21 DIAGNOSIS — M9903 Segmental and somatic dysfunction of lumbar region: Secondary | ICD-10-CM | POA: Diagnosis not present

## 2019-12-21 DIAGNOSIS — M9902 Segmental and somatic dysfunction of thoracic region: Secondary | ICD-10-CM | POA: Diagnosis not present

## 2019-12-21 DIAGNOSIS — M62830 Muscle spasm of back: Secondary | ICD-10-CM | POA: Diagnosis not present

## 2019-12-23 DIAGNOSIS — M9904 Segmental and somatic dysfunction of sacral region: Secondary | ICD-10-CM | POA: Diagnosis not present

## 2019-12-23 DIAGNOSIS — M9903 Segmental and somatic dysfunction of lumbar region: Secondary | ICD-10-CM | POA: Diagnosis not present

## 2019-12-23 DIAGNOSIS — M9902 Segmental and somatic dysfunction of thoracic region: Secondary | ICD-10-CM | POA: Diagnosis not present

## 2019-12-23 DIAGNOSIS — M62830 Muscle spasm of back: Secondary | ICD-10-CM | POA: Diagnosis not present

## 2019-12-24 DIAGNOSIS — M9904 Segmental and somatic dysfunction of sacral region: Secondary | ICD-10-CM | POA: Diagnosis not present

## 2019-12-24 DIAGNOSIS — M62830 Muscle spasm of back: Secondary | ICD-10-CM | POA: Diagnosis not present

## 2019-12-24 DIAGNOSIS — M9903 Segmental and somatic dysfunction of lumbar region: Secondary | ICD-10-CM | POA: Diagnosis not present

## 2019-12-24 DIAGNOSIS — M9902 Segmental and somatic dysfunction of thoracic region: Secondary | ICD-10-CM | POA: Diagnosis not present

## 2019-12-28 DIAGNOSIS — M9902 Segmental and somatic dysfunction of thoracic region: Secondary | ICD-10-CM | POA: Diagnosis not present

## 2019-12-28 DIAGNOSIS — M9903 Segmental and somatic dysfunction of lumbar region: Secondary | ICD-10-CM | POA: Diagnosis not present

## 2019-12-28 DIAGNOSIS — M9904 Segmental and somatic dysfunction of sacral region: Secondary | ICD-10-CM | POA: Diagnosis not present

## 2019-12-28 DIAGNOSIS — M62830 Muscle spasm of back: Secondary | ICD-10-CM | POA: Diagnosis not present

## 2019-12-30 DIAGNOSIS — M9904 Segmental and somatic dysfunction of sacral region: Secondary | ICD-10-CM | POA: Diagnosis not present

## 2019-12-30 DIAGNOSIS — M62830 Muscle spasm of back: Secondary | ICD-10-CM | POA: Diagnosis not present

## 2019-12-30 DIAGNOSIS — M9902 Segmental and somatic dysfunction of thoracic region: Secondary | ICD-10-CM | POA: Diagnosis not present

## 2019-12-30 DIAGNOSIS — M9903 Segmental and somatic dysfunction of lumbar region: Secondary | ICD-10-CM | POA: Diagnosis not present

## 2020-01-04 DIAGNOSIS — M9903 Segmental and somatic dysfunction of lumbar region: Secondary | ICD-10-CM | POA: Diagnosis not present

## 2020-01-04 DIAGNOSIS — M9904 Segmental and somatic dysfunction of sacral region: Secondary | ICD-10-CM | POA: Diagnosis not present

## 2020-01-04 DIAGNOSIS — M9902 Segmental and somatic dysfunction of thoracic region: Secondary | ICD-10-CM | POA: Diagnosis not present

## 2020-01-04 DIAGNOSIS — M62830 Muscle spasm of back: Secondary | ICD-10-CM | POA: Diagnosis not present

## 2020-01-06 DIAGNOSIS — M9903 Segmental and somatic dysfunction of lumbar region: Secondary | ICD-10-CM | POA: Diagnosis not present

## 2020-01-06 DIAGNOSIS — M9904 Segmental and somatic dysfunction of sacral region: Secondary | ICD-10-CM | POA: Diagnosis not present

## 2020-01-06 DIAGNOSIS — M9902 Segmental and somatic dysfunction of thoracic region: Secondary | ICD-10-CM | POA: Diagnosis not present

## 2020-01-06 DIAGNOSIS — M62830 Muscle spasm of back: Secondary | ICD-10-CM | POA: Diagnosis not present

## 2020-01-12 DIAGNOSIS — M9902 Segmental and somatic dysfunction of thoracic region: Secondary | ICD-10-CM | POA: Diagnosis not present

## 2020-01-12 DIAGNOSIS — M9904 Segmental and somatic dysfunction of sacral region: Secondary | ICD-10-CM | POA: Diagnosis not present

## 2020-01-12 DIAGNOSIS — M62830 Muscle spasm of back: Secondary | ICD-10-CM | POA: Diagnosis not present

## 2020-01-12 DIAGNOSIS — M9903 Segmental and somatic dysfunction of lumbar region: Secondary | ICD-10-CM | POA: Diagnosis not present

## 2020-01-18 ENCOUNTER — Other Ambulatory Visit: Payer: Self-pay | Admitting: *Deleted

## 2020-01-18 MED ORDER — DEPAKOTE ER 500 MG PO TB24
ORAL_TABLET | ORAL | 3 refills | Status: DC
Start: 2020-01-18 — End: 2020-01-25

## 2020-01-18 NOTE — Telephone Encounter (Signed)
Depakote ER prescription printed. Awaiting MD signature.

## 2020-01-18 NOTE — Telephone Encounter (Signed)
Michelle Need Hard Copy RX please and Dr. Krista Blue to sign please Thanks Sharyn Lull.

## 2020-01-19 ENCOUNTER — Ambulatory Visit: Payer: Self-pay | Admitting: Urology

## 2020-01-19 NOTE — Telephone Encounter (Signed)
Noted.  Sent.

## 2020-01-19 NOTE — Telephone Encounter (Signed)
Signed by Dr. Krista Blue and returned to [facility].

## 2020-01-21 DIAGNOSIS — M9903 Segmental and somatic dysfunction of lumbar region: Secondary | ICD-10-CM | POA: Diagnosis not present

## 2020-01-21 DIAGNOSIS — M9902 Segmental and somatic dysfunction of thoracic region: Secondary | ICD-10-CM | POA: Diagnosis not present

## 2020-01-21 DIAGNOSIS — M62830 Muscle spasm of back: Secondary | ICD-10-CM | POA: Diagnosis not present

## 2020-01-21 DIAGNOSIS — M9904 Segmental and somatic dysfunction of sacral region: Secondary | ICD-10-CM | POA: Diagnosis not present

## 2020-01-25 ENCOUNTER — Other Ambulatory Visit: Payer: Self-pay | Admitting: Neurology

## 2020-01-25 ENCOUNTER — Telehealth: Payer: Self-pay | Admitting: Neurology

## 2020-01-25 DIAGNOSIS — M62830 Muscle spasm of back: Secondary | ICD-10-CM | POA: Diagnosis not present

## 2020-01-25 DIAGNOSIS — M9902 Segmental and somatic dysfunction of thoracic region: Secondary | ICD-10-CM | POA: Diagnosis not present

## 2020-01-25 DIAGNOSIS — M9903 Segmental and somatic dysfunction of lumbar region: Secondary | ICD-10-CM | POA: Diagnosis not present

## 2020-01-25 DIAGNOSIS — M9904 Segmental and somatic dysfunction of sacral region: Secondary | ICD-10-CM | POA: Diagnosis not present

## 2020-01-25 MED ORDER — DIVALPROEX SODIUM ER 500 MG PO TB24: 1500.0000 mg | ORAL_TABLET | Freq: Two times a day (BID) | ORAL | 3 refills | Status: DC

## 2020-01-25 NOTE — Telephone Encounter (Signed)
Patient called me back . I called him back could not get him. Was not able to leave a voice mail. Thanks Hinton Dyer.

## 2020-01-25 NOTE — Telephone Encounter (Signed)
I have called and left patient a message to call me because patient has been denied for patient assistance Depakote ER . Per Dina Rich patient has insurance that will help with his coverage . Marland Kitchen Patient can call [phone].  Or [phone].   Faith Rogue , Dr. Krista Blue

## 2020-01-25 NOTE — Telephone Encounter (Signed)
Please let patient know, last visit was in December 2020, pending visit with Judson Roch in January 2022    Please call patient, he may consider good Rx, we also mentioned about tapering off dilantin, if he has been seizure visit. The adjustment will be discussed during his follow up visit, make sure he comes for follow up

## 2020-01-25 NOTE — Telephone Encounter (Addendum)
I spoke to his mother on [place]. 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 She asked that his prescription be sent to [facility] in [place]. She also reports his last seizure being more than five years ago. She is aware that tapering off Dilantin may be a possibility after his next appointment on 02/09/2020.    Per vo by Dr. Krista Blue, okay to send in a generic Depakote ER at the same dosage.

## 2020-01-25 NOTE — Addendum Note (Signed)
Addended by: Desmond Lope on: 01/25/2020 04:57 PM   Modules accepted: Orders

## 2020-01-26 ENCOUNTER — Ambulatory Visit: Payer: Self-pay | Admitting: Neurology

## 2020-02-01 DIAGNOSIS — M9902 Segmental and somatic dysfunction of thoracic region: Secondary | ICD-10-CM | POA: Diagnosis not present

## 2020-02-01 DIAGNOSIS — M62830 Muscle spasm of back: Secondary | ICD-10-CM | POA: Diagnosis not present

## 2020-02-01 DIAGNOSIS — M9904 Segmental and somatic dysfunction of sacral region: Secondary | ICD-10-CM | POA: Diagnosis not present

## 2020-02-01 DIAGNOSIS — M9903 Segmental and somatic dysfunction of lumbar region: Secondary | ICD-10-CM | POA: Diagnosis not present

## 2020-02-01 NOTE — Telephone Encounter (Signed)
Pt called. He picked up generic depakote from pharmacy. Was not aware it would be generic. I reviewed note and confirmed that RN got order from MD to call in generic. He would like to still confirm with nurse, I transferred call to [facility] to further discuss with pt.

## 2020-02-02 ENCOUNTER — Ambulatory Visit: Payer: Self-pay | Admitting: Gastroenterology

## 2020-02-08 NOTE — Progress Notes (Deleted)
PATIENT: Jerry [name] DOB: 10/13/1965  REASON FOR VISIT: follow up HISTORY FROM: patient  HISTORY OF PRESENT ILLNESS: Today 02/08/20  HISTORY HISTORY: Jerry [name] is a 55 year old right-handed Caucasian male, alone at visit, to followup his seizure disorder Last clinical visit was our office was in November 2013 with Jerry [name], he had a history of seizure disorder since subdural hematoma after a traumatic brain injury in 1984, previous abnormal EEG, he also had some anxiety disorder, he is no longer working. He lives with his mother, driving, last clinical seizure was September 2011, He has been on long-term combination therapy of Depakote ER 500 mg 2 tablets twice a day, high dose of Dilantin 100 mg 3 tablets twice a day, plus chewable 50 mg once a day He is getting assistance program for Depakote ER, coordinated through our office He is overall doing well, tolerating medications, no recurrent seizure  UPDATE 04/26/18[name] 55 year old male returns for follow-up. He has a history of seizure disorder and is currently on Depakote from patient assistance and Dilantin 200 mg at night. Last seizure activity was 2011. He has had no new medical issues since last seen. He drives a car without difficulty. He returns for reevaluation and he needs labs to monitor any adverse effects of his seizure medications. UPDATE5/9/2019[name], 55 year old male returns for follow-up with history of seizure disorder. Last seizure occurred in September 2011. Patient currently on Depakote 500mg  3 tabsTwice daily and Dilantin 100 mg 2 capsules twice daily. No new medical issues since last seen. He returns for reevaluation,labs and refills  Update January 22, 2019 [name] is a 55 year old male with history of seizure disorder.  He has patient assistance for Depakote.  He remains on Depakote ER 500 mg, 3 tablets twice daily, Dilantin 100 mg ER, 2 tablets twice a day.  He says  his last seizure was about 10 years ago.  He is tolerating his medications without side effect.  He denies any new problems or concerns.  He says he does have chronic ringing in his right ear, as result of hearing loss.  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  He denies any changes to his balance or gait.  He presents today for evaluation unaccompanied.  Update February 09, 2020 SS:     REVIEW OF SYSTEMS: Out of a complete 14 system review of symptoms, the patient complains only of the following symptoms, and all other reviewed systems are negative.  ALLERGIES: No Known Allergies  HOME MEDICATIONS: Outpatient Medications Prior to Visit  Medication Sig Dispense Refill  . divalproex (DEPAKOTE ER) 500 MG 24 hr tablet Take 3 tablets in the morning and 3 tablets in the evening. 540 tablet 3  . phenytoin (DILANTIN) 100 MG ER capsule Take 2 capsules (200 mg total) by mouth 2 (two) times daily. (Patient taking differently: Take 200 mg by mouth at bedtime. ) 120 capsule 11  . rosuvastatin (CRESTOR) 10 MG tablet Take 1 tablet (10 mg total) by mouth daily. For cholesterol 90 tablet 1  . Vitamin D, Ergocalciferol, (DRISDOL) 1.25 MG (50000 UNIT) CAPS capsule Take 1 capsule (50,000 Units total) by mouth every 7 (seven) days. 13 capsule 3   No facility-administered medications prior to visit.    PAST MEDICAL HISTORY: Past Medical History:  Diagnosis Date  . Cancer ([place])    skin  . Encounter for long-term (current) use of other medications   . Head injury   .  Seizure ([place])   . Varicose veins   . Vasculitis ([place]) 11/2014    PAST SURGICAL HISTORY: Past Surgical History:  Procedure Laterality Date  . plate on head    . TOE INTERNAL FIXATION W/ COMPRESSION SCREW      FAMILY HISTORY: Family History  Problem Relation Age of Onset  . Diabetes Father     SOCIAL HISTORY: Social History   Socioeconomic History  . Marital status: Married    Spouse name:  Not on file  . Number of children: 0  . Years of education: 52  . Highest education level: Not on file  Occupational History    Comment: Not working  Tobacco Use  . Smoking status: Former Smoker    Packs/day: 0.50    Quit date: 10/21/2014    Years since quitting: 5.3  . Smokeless tobacco: Never Used  Vaping Use  . Vaping Use: Never used  Substance and Sexual Activity  . Alcohol use: No    Alcohol/week: 0.0 standard drinks  . Drug use: No  . Sexual activity: Not on file  Other Topics Concern  . Not on file  Social History Narrative   Patient is currently not [place] school education some college. Patient  Is single and lives with his mother.    Right handed.   Caffeine one cup daily.   Social Determinants of Health   Financial Resource Strain: Not on file  Food Insecurity: Not on file  Transportation Needs: Not on file  Physical Activity: Not on file  Stress: Not on file  Social Connections: Not on file  Intimate Partner Violence: Not on file      PHYSICAL EXAM  There were no vitals filed for this visit. There is no height or weight on file to calculate BMI.  Generalized: Well developed, in no acute distress   Neurological examination  Mentation: Alert oriented to time, place, history taking. Follows all commands speech and language fluent Cranial nerve II-XII: Pupils were equal round reactive to light. Extraocular movements were full, visual field were full on confrontational test. Facial sensation and strength were normal. Uvula tongue midline. Head turning and shoulder shrug  were normal and symmetric. Motor: The motor testing reveals 5 over 5 strength of all 4 extremities. Good symmetric motor tone is noted throughout.  Sensory: Sensory testing is intact to soft touch on all 4 extremities. No evidence of extinction is noted.  Coordination: Cerebellar testing reveals good finger-nose-finger and heel-to-shin bilaterally.  Gait and station: Gait is normal.  Tandem gait is normal. Romberg is negative. No drift is seen.  Reflexes: Deep tendon reflexes are symmetric and normal bilaterally.   DIAGNOSTIC DATA (LABS, IMAGING, TESTING) - I reviewed patient records, labs, notes, testing and imaging myself where available.  Lab Results  Component Value Date   WBC 9.2 12/07/2019   HGB 15.5 12/07/2019   HCT 44.4 12/07/2019   MCV 95 12/07/2019   PLT 263 12/07/2019      Component Value Date/Time   NA 143 12/07/2019 1042   K 4.1 12/07/2019 1042   CL 103 12/07/2019 1042   CO2 22 12/07/2019 1042   GLUCOSE 87 12/07/2019 1042   BUN 10 12/07/2019 1042   CREATININE 0.95 12/07/2019 1042   CALCIUM 9.4 12/07/2019 1042   PROT 7.2 12/07/2019 1042   ALBUMIN 4.4 12/07/2019 1042   AST 22 12/07/2019 1042   ALT 23 12/07/2019 1042   ALKPHOS 85 12/07/2019 1042   BILITOT 0.2 12/07/2019 1042   GFRNONAA  91 12/07/2019 1042   GFRAA 105 12/07/2019 1042   Lab Results  Component Value Date   CHOL 254 (H) 12/07/2019   HDL 36 (L) 12/07/2019   LDLCALC 172 (H) 12/07/2019   TRIG 245 (H) 12/07/2019   CHOLHDL 7.1 (H) 12/07/2019   No results found for: HGBA1C No results found for: VITAMINB12 Lab Results  Component Value Date   TSH 2.070 03/19/2017      ASSESSMENT AND PLAN 55 y.o. year old male  has a past medical history of Cancer (HCC), Encounter for long-term (current) use of other medications, Head injury, Seizure (HCC), Varicose veins, and Vasculitis (HCC) (11/2014). here with ***   I spent 15 minutes with the patient. 50% of this time was spent   Margie Ege, [place], DNP 02/08/2020, 1:34 PM [facility] [address], [address] [place], [place] [address] [phone]

## 2020-02-09 ENCOUNTER — Ambulatory Visit: Payer: Self-pay | Admitting: Neurology

## 2020-02-09 ENCOUNTER — Encounter: Payer: Self-pay | Admitting: Neurology

## 2020-02-10 ENCOUNTER — Other Ambulatory Visit: Payer: BC Managed Care – PPO

## 2020-02-10 ENCOUNTER — Other Ambulatory Visit: Payer: Self-pay | Admitting: Critical Care Medicine

## 2020-02-10 DIAGNOSIS — Z20822 Contact with and (suspected) exposure to covid-19: Secondary | ICD-10-CM

## 2020-02-11 DIAGNOSIS — H10013 Acute follicular conjunctivitis, bilateral: Secondary | ICD-10-CM | POA: Diagnosis not present

## 2020-02-11 LAB — NOVEL CORONAVIRUS, NAA: SARS-CoV-2, NAA: DETECTED — AB

## 2020-02-11 LAB — SPECIMEN STATUS REPORT

## 2020-02-11 LAB — SARS-COV-2, NAA 2 DAY TAT

## 2020-02-12 ENCOUNTER — Ambulatory Visit: Payer: Self-pay

## 2020-02-12 ENCOUNTER — Telehealth: Payer: Self-pay | Admitting: Family Medicine

## 2020-02-12 NOTE — Telephone Encounter (Signed)
Pt's mom called to get the pt's covid test results   She is on the [facility]. She states he needs his test results so he can get back to work.

## 2020-02-12 NOTE — Telephone Encounter (Signed)
Returned call to patient. He needed to know if COVID-19 results were reported to his employer. I informed him that [employer] protects all health information and it is not reported to his employer. He states he reached out to employer but has gotten no response. I reviewed Criteria for ending isolation. I reviewed symptoms and treatment plans. He verbalized understanding. I gave him [employer] number for obtaining copy of result for his employer.  He verbalized understanding of all information

## 2020-02-12 NOTE — Telephone Encounter (Signed)
Patient's mother , DPR, called back to review covid test results.  Patient mother notified of positive COVID-19 test results. Pt mother verbalized understanding. Pt mother  reports symptoms of upper respiratory symptoms. Criteria for self-isolation:  -Please quarantine and isolate at home  for at least 10 days since symptoms started AND - At least 24 hours fever free without the use of fever reducing medications such as Tylenol or Ibuprofen AND - Improvement in respiratory symptoms Use over-the-counter medications for symptoms.If you develop respiratory issues/distress, seek medical care in the Emergency Department.  If you must leave home or if you have to be around others please wear a mask. Please limit contact with immediate family members in the home, practice social distancing, frequent handwashing and clean hard surfaces touched frequently with household cleaning products. Members of your household will also need to quarantine and test. Pt mother informed that the health department will likely follow up and may have additional recommendations.

## 2020-03-04 ENCOUNTER — Ambulatory Visit: Payer: Self-pay | Admitting: Urology

## 2020-03-04 DIAGNOSIS — R972 Elevated prostate specific antigen [PSA]: Secondary | ICD-10-CM

## 2020-04-27 ENCOUNTER — Telehealth: Payer: Self-pay | Admitting: Neurology

## 2020-04-27 NOTE — Telephone Encounter (Addendum)
I called [facility] for pt.  Marcene Brawn, at [phone], she stated that first 3 fills per his plan is $10, then after that needs to get mail order. Shekira at pharmacy helpdesk relayed that cost would be $25 for #540, uses [facility] mail order. Has enough to get by until he believes 06-09-2020.  Last seen 01-2019.

## 2020-04-27 NOTE — Telephone Encounter (Signed)
Pt would like a call from RN to discuss questions and concerns he has about divalproex (DEPAKOTE ER) 500 MG 24 hr tablet

## 2020-05-11 ENCOUNTER — Ambulatory Visit: Payer: BC Managed Care – PPO | Admitting: Family Medicine

## 2020-05-11 ENCOUNTER — Encounter: Payer: Self-pay | Admitting: Family Medicine

## 2020-05-11 ENCOUNTER — Other Ambulatory Visit: Payer: Self-pay

## 2020-05-11 VITALS — BP 133/73 | HR 68 | Temp 97.6°F | Wt 232.4 lb

## 2020-05-11 DIAGNOSIS — R011 Cardiac murmur, unspecified: Secondary | ICD-10-CM

## 2020-05-11 DIAGNOSIS — Z23 Encounter for immunization: Secondary | ICD-10-CM

## 2020-05-11 NOTE — Addendum Note (Signed)
Addended by: Baldomero Lamy B on: 05/11/2020 03:24 PM   Modules accepted: Orders

## 2020-05-11 NOTE — Progress Notes (Signed)
Subjective:  Patient ID: JASYN MEY, male    DOB: 07/22/1965  Age: 55 y.o. MRN: [id number]  CC: Heart Murmur   HPI Jerry [name] presents for heart murmur discovered on a recent routine wellness visit at his workplace.  This was being done by a nurse and she noted a murmur.  She called the's office asking for evaluation and possible cardiology referral.  There is also some concern about his blood pressure being elevated.  Depression screen [facility] 2/9 05/11/2020 10/22/2019 10/30/2017  Decreased Interest 0 0 0  Down, Depressed, Hopeless 0 0 0  PHQ - 2 Score 0 0 0  Altered sleeping - - -  Tired, decreased energy - - -  Change in appetite - - -  Feeling bad or failure about yourself  - - -  Trouble concentrating - - -  Moving slowly or fidgety/restless - - -  Suicidal thoughts - - -  PHQ-9 Score - - -  Difficult doing work/chores - - -    History Jerry [name] has a past medical history of Cancer ([place]), Encounter for long-term (current) use of other medications, Head injury, Seizure ([place]), Varicose veins, and Vasculitis ([place]) (11/2014).   Jerry [name] has a past surgical history that includes Toe internal fixation w/ compression screw and plate on head.   His family history includes Diabetes in his father.Jerry [name] reports that Jerry [name] quit smoking about 5 years ago. Jerry [name] smoked 0.50 packs per day. Jerry [name] has never used smokeless tobacco. Jerry [name] reports that Jerry [name] does not drink alcohol and does not use drugs.    ROS Review of Systems  Constitutional: Negative for fever.  Respiratory: Negative for chest tightness and shortness of breath.   Cardiovascular: Negative for chest pain, palpitations and leg swelling.  Musculoskeletal: Negative for arthralgias.  Skin: Negative for rash.    Objective:  BP 133/73   Pulse 68   Temp 97.6 F (36.4 C)   Wt 232 lb 6.4 oz (105.4 kg)   SpO2 95%   BMI 33.35 kg/m   BP Readings from Last 3 Encounters:  05/11/20 133/73  12/07/19 131/83  10/22/19 134/79    Wt Readings  from Last 3 Encounters:  05/11/20 232 lb 6.4 oz (105.4 kg)  12/07/19 231 lb 6 oz (105 kg)  10/22/19 230 lb (104.3 kg)     Physical Exam Vitals reviewed.  Constitutional:      Appearance: Jerry [name] is well-developed.  HENT:     Head: Normocephalic and atraumatic.     Right Ear: External ear normal.     Left Ear: External ear normal.     Mouth/Throat:     Pharynx: No oropharyngeal exudate or posterior oropharyngeal erythema.  Eyes:     Pupils: Pupils are equal, round, and reactive to light.  Cardiovascular:     Rate and Rhythm: Normal rate and regular rhythm.     Heart sounds: No murmur (Left sternal border 2/6. Minimal  at the right sternal border) heard.   Pulmonary:     Effort: No respiratory distress.     Breath sounds: Normal breath sounds.  Musculoskeletal:     Cervical back: Normal range of motion and neck supple.  Neurological:     Mental Status: Jerry [name] is alert and oriented to person, place, and time.      EKG: Normal sinus rhythm no ischemic changes.  Some nonspecific T wave changes Assessment & Plan:   Jerry [name] was seen today for heart murmur.  Diagnoses and all orders for this  visit:  Heart murmur -     EKG 12-Lead -     ECHOCARDIOGRAM COMPLETE; Future     DASHEating plan recommended  I am having Jerry [name] maintain his phenytoin, Vitamin D (Ergocalciferol), rosuvastatin, and divalproex.  Allergies as of 05/11/2020   No Known Allergies     Medication List       Accurate as of May 11, 2020  2:52 PM. If you have any questions, ask your nurse or doctor.        divalproex 500 MG 24 hr tablet Commonly known as: DEPAKOTE ER Take 3 tablets in the morning and 3 tablets in the evening.   phenytoin 100 MG ER capsule Commonly known as: DILANTIN Take 2 capsules (200 mg total) by mouth 2 (two) times daily. What changed: when to take this   rosuvastatin 10 MG tablet Commonly known as: Crestor Take 1 tablet (10 mg total) by mouth daily. For  cholesterol   Vitamin D (Ergocalciferol) 1.25 MG (50000 UNIT) Caps capsule Commonly known as: DRISDOL Take 1 capsule (50,000 Units total) by mouth every 7 (seven) days.        Follow-up: Return if symptoms worsen or fail to improve.  Claretta Fraise, M.D.

## 2020-05-11 NOTE — Patient Instructions (Signed)

## 2020-05-16 ENCOUNTER — Telehealth: Payer: Self-pay | Admitting: Family Medicine

## 2020-05-18 ENCOUNTER — Other Ambulatory Visit: Payer: Self-pay | Admitting: Family Medicine

## 2020-05-18 ENCOUNTER — Other Ambulatory Visit (HOSPITAL_COMMUNITY): Payer: BC Managed Care – PPO

## 2020-05-18 DIAGNOSIS — R011 Cardiac murmur, unspecified: Secondary | ICD-10-CM

## 2020-05-19 ENCOUNTER — Other Ambulatory Visit: Payer: Self-pay | Admitting: Family Medicine

## 2020-05-19 ENCOUNTER — Other Ambulatory Visit: Payer: Self-pay | Admitting: Cardiology

## 2020-05-19 DIAGNOSIS — R011 Cardiac murmur, unspecified: Secondary | ICD-10-CM

## 2020-05-22 ENCOUNTER — Other Ambulatory Visit: Payer: Self-pay | Admitting: Neurology

## 2020-05-23 ENCOUNTER — Other Ambulatory Visit: Payer: Self-pay

## 2020-05-23 MED ORDER — PHENYTOIN SODIUM EXTENDED 100 MG PO CAPS: 200.0000 mg | ORAL_CAPSULE | Freq: Two times a day (BID) | ORAL | 2 refills | Status: DC

## 2020-05-23 NOTE — Telephone Encounter (Signed)
Last office visit 05/11/20 Last refill 01/22/2019, #120, 11 refills

## 2020-06-02 ENCOUNTER — Ambulatory Visit: Payer: BC Managed Care – PPO | Admitting: Neurology

## 2020-06-02 ENCOUNTER — Encounter: Payer: Self-pay | Admitting: Neurology

## 2020-06-02 VITALS — BP 166/84 | HR 61 | Ht 70.0 in | Wt 233.0 lb

## 2020-06-02 DIAGNOSIS — G40909 Epilepsy, unspecified, not intractable, without status epilepticus: Secondary | ICD-10-CM

## 2020-06-02 MED ORDER — PHENYTOIN SODIUM EXTENDED 100 MG PO CAPS: 200.0000 mg | ORAL_CAPSULE | Freq: Two times a day (BID) | ORAL | 11 refills | Status: DC

## 2020-06-02 MED ORDER — DIVALPROEX SODIUM ER 500 MG PO TB24: ORAL_TABLET | ORAL | 3 refills | Status: DC

## 2020-06-02 NOTE — Patient Instructions (Signed)
Check labs today  Continue current medications for now Call for seizures See you back in 1 year

## 2020-06-02 NOTE — Progress Notes (Signed)
PATIENT: Jerry [name] DOB: 11/15/1965  REASON FOR VISIT: follow up HISTORY FROM: patient  HISTORY OF PRESENT ILLNESS: Today 06/02/20  HISTORY HISTORY: Jerry [name] is a 55 year old right-handed Caucasian male, alone at visit, to followup his seizure disorder Last clinical visit was our office was in November 2013 with Jerry [name], he had a history of seizure disorder since subdural hematoma after a traumatic brain injury in 1984, previous abnormal EEG, he also had some anxiety disorder, he is no longer working. He lives with his mother, driving, last clinical seizure was September 2011, He has been on long-term combination therapy of Depakote ER 500 mg 2 tablets twice a day, high dose of Dilantin 100 mg 3 tablets twice a day, plus chewable 50 mg once a day He is getting assistance program for Depakote ER, coordinated through our office He is overall doing well, tolerating medications, no recurrent seizure  UPDATE 04/26/18CMMr. Jerry [name] 55 year old male returns for follow-up. He has a history of seizure disorder and is currently on Depakote from  patient assistance and Dilantin 200 mg at night. Last seizure activity was 2011. He has had no new medical issues since last seen. He drives a car without difficulty. He returns for reevaluation and he needs labs to monitor any adverse effects of his seizure medications. UPDATE 5/9/2019[name], 55 year old male returns for follow-up with history of seizure disorder.  Last seizure occurred in September 2011.  Patient currently on Depakote 500mg  3 tabsTwice daily and Dilantin 100 mg 2 capsules twice daily.  No new medical issues since last seen.  He returns for reevaluation, labs and refills  Update January 22, 2019 [name] is a 55 year old male with history of seizure disorder.  He has patient assistance for Depakote.  He remains on Depakote ER 500 mg, 3 tablets twice daily, Dilantin 100 mg ER, 2 tablets twice a day.  He says  his last seizure was about 10 years ago.  He is tolerating his medications without side effect.  He denies any new problems or concerns.  He says he does have chronic ringing in his right ear, as result of hearing loss.  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  He denies any changes to his balance or gait.  He presents today for evaluation unaccompanied.  Update June 02, 2020 SS: Last seen in Dec 2020, no seizures. Was able to switch to generic Depakote ER 500 mg, 3 tablet twice daily. Works in Armed forces technical officer, Nature conservation officer, works scales. Prescribed Dilantin 100 mg, 2 twice daily. But actually only taking 200 mg at bedtime, for 3 years. Is on generic for both. He drives. Last seizure was 10 years ago. Lives with mom, she has poor health. Found heart murmur, having echo soon.  Not interested in weaning off Dilantin at this point.   REVIEW OF SYSTEMS: Out of a complete 14 system review of symptoms, the patient complains only of the following symptoms, and all other reviewed systems are negative.  Seizures   ALLERGIES: No Known Allergies  HOME MEDICATIONS: Outpatient Medications Prior to Visit  Medication Sig Dispense Refill  . divalproex (DEPAKOTE ER) 500 MG 24 hr tablet Take 3 tablets in the morning and 3 tablets in the evening. 540 tablet 3  . phenytoin (DILANTIN) 100 MG ER capsule Take 2 capsules (200 mg total) by mouth 2 (two) times daily. 120 capsule 2  . rosuvastatin (CRESTOR) 10 MG tablet Take 1 tablet (  10 mg total) by mouth daily. For cholesterol 90 tablet 1  . Vitamin D, Ergocalciferol, (DRISDOL) 1.25 MG (50000 UNIT) CAPS capsule Take 1 capsule (50,000 Units total) by mouth every 7 (seven) days. 13 capsule 3   No facility-administered medications prior to visit.    PAST MEDICAL HISTORY: Past Medical History:  Diagnosis Date  . Cancer ([place])    skin  . Encounter for long-term (current) use of other medications   . Head injury   . Seizure  ([place])   . Varicose veins   . Vasculitis ([place]) 11/2014    PAST SURGICAL HISTORY: Past Surgical History:  Procedure Laterality Date  . plate on head    . TOE INTERNAL FIXATION W/ COMPRESSION SCREW      FAMILY HISTORY: Family History  Problem Relation Age of Onset  . Diabetes Father     SOCIAL HISTORY: Social History   Socioeconomic History  . Marital status: Married    Spouse name: Not on file  . Number of children: 0  . Years of education: 35  . Highest education level: Not on file  Occupational History    Comment: Not working  Tobacco Use  . Smoking status: Former Smoker    Packs/day: 0.50    Quit date: 10/21/2014    Years since quitting: 5.6  . Smokeless tobacco: Never Used  Vaping Use  . Vaping Use: Never used  Substance and Sexual Activity  . Alcohol use: No    Alcohol/week: 0.0 standard drinks  . Drug use: No  . Sexual activity: Not on file  Other Topics Concern  . Not on file  Social History Narrative   Patient is currently not [place] school education some college. Patient  Is single and lives with his mother.    Right handed.   Caffeine one cup daily.   Social Determinants of Health   Financial Resource Strain: Not on file  Food Insecurity: Not on file  Transportation Needs: Not on file  Physical Activity: Not on file  Stress: Not on file  Social Connections: Not on file  Intimate Partner Violence: Not on file   PHYSICAL EXAM  Vitals:   06/02/20 0751  BP: (!) 166/84  Pulse: 61  Weight: 233 lb (105.7 kg)  Height: 5\' 10"  (1.778 m)   Body mass index is 33.43 kg/m.  Generalized: Well developed, in no acute distress   Neurological examination  Mentation: Alert oriented to time, place, history taking. Follows all commands speech and language fluent Cranial nerve II-XII: Pupils were equal round reactive to light. Extraocular movements were full, visual field were full on confrontational test. Facial sensation and strength were normal.  Head turning and shoulder shrug  were normal and symmetric. Motor: The motor testing reveals 5 over 5 strength of all 4 extremities. Good symmetric motor tone is noted throughout.  Sensory: Sensory testing is intact to soft touch on all 4 extremities. No evidence of extinction is noted.  Coordination: Cerebellar testing reveals good finger-nose-finger and heel-to-shin bilaterally.  Gait and station: Gait is normal. Tandem gait is normal.   Reflexes: Deep tendon reflexes are symmetric and normal bilaterally.   DIAGNOSTIC DATA (LABS, IMAGING, TESTING) - I reviewed patient records, labs, notes, testing and imaging myself where available.  Lab Results  Component Value Date   WBC 9.2 12/07/2019   HGB 15.5 12/07/2019   HCT 44.4 12/07/2019   MCV 95 12/07/2019   PLT 263 12/07/2019      Component Value Date/Time  NA 143 12/07/2019 1042   K 4.1 12/07/2019 1042   CL 103 12/07/2019 1042   CO2 22 12/07/2019 1042   GLUCOSE 87 12/07/2019 1042   BUN 10 12/07/2019 1042   CREATININE 0.95 12/07/2019 1042   CALCIUM 9.4 12/07/2019 1042   PROT 7.2 12/07/2019 1042   ALBUMIN 4.4 12/07/2019 1042   AST 22 12/07/2019 1042   ALT 23 12/07/2019 1042   ALKPHOS 85 12/07/2019 1042   BILITOT 0.2 12/07/2019 1042   GFRNONAA 91 12/07/2019 1042   GFRAA 105 12/07/2019 1042   Lab Results  Component Value Date   CHOL 254 (H) 12/07/2019   HDL 36 (L) 12/07/2019   LDLCALC 172 (H) 12/07/2019   TRIG 245 (H) 12/07/2019   CHOLHDL 7.1 (H) 12/07/2019   No results found for: HGBA1C No results found for: VITAMINB12 Lab Results  Component Value Date   TSH 2.070 03/19/2017      ASSESSMENT AND PLAN 55 y.o. year old male  has a past medical history of Cancer ([place]), Encounter for long-term (current) use of other medications, Head injury, Seizure ([place]), Varicose veins, and Vasculitis ([place]) (11/2014). here with:  1. Seizures  -No seizures in about 10 years -Doing well on generic Dilantin and Depakote -Since  excellent seizure control, we discussed weaning off Dilantin, he is not interested at this time -Continue Dilantin 100 mg, 2 capsules twice daily, but is actually only taking 200 mg daily -Continue Depakote ER 500 mg, 3 tablets twice daily -Lengthy discussion about using Good Rx, should be able to get a 1 month supply of Depakote (180 tablets) for $ 50 at [facility], was given a printed prescription today -Check routine labs today -He will call for recurrent seizure, follow-up in 1 year or sooner if needed  I spent 33 minutes of face-to-face and non-face-to-face time with patient, this included a lengthy discussion about cost savings programs using Good Rx for his Depakote prescription, how to check prices at pharmacies and utilize co-pay cards.  We also discussed weaning off Dilantin, given that he has been seizure-free for so long.  Butler Denmark, AGNP-C, DNP 06/02/2020, 8:07 AM [facility] [address], [place], [place] [address] [phone]

## 2020-06-03 LAB — CBC WITH DIFFERENTIAL/PLATELET
Basophils Absolute: 0.1 10*3/uL (ref 0.0–0.2)
Basos: 1 %
EOS (ABSOLUTE): 0.1 10*3/uL (ref 0.0–0.4)
Eos: 1 %
Hematocrit: 47.8 % (ref 37.5–51.0)
Hemoglobin: 16 g/dL (ref 13.0–17.7)
Immature Grans (Abs): 0.1 10*3/uL (ref 0.0–0.1)
Immature Granulocytes: 2 %
Lymphocytes Absolute: 2.3 10*3/uL (ref 0.7–3.1)
Lymphs: 25 %
MCH: 32.7 pg (ref 26.6–33.0)
MCHC: 33.5 g/dL (ref 31.5–35.7)
MCV: 98 fL — ABNORMAL HIGH (ref 79–97)
Monocytes Absolute: 1 10*3/uL — ABNORMAL HIGH (ref 0.1–0.9)
Monocytes: 11 %
Neutrophils Absolute: 5.4 10*3/uL (ref 1.4–7.0)
Neutrophils: 60 %
Platelets: 223 10*3/uL (ref 150–450)
RBC: 4.9 x10E6/uL (ref 4.14–5.80)
RDW: 13.1 % (ref 11.6–15.4)
WBC: 9.1 10*3/uL (ref 3.4–10.8)

## 2020-06-03 LAB — COMPREHENSIVE METABOLIC PANEL
ALT: 15 IU/L (ref 0–44)
AST: 17 IU/L (ref 0–40)
Albumin/Globulin Ratio: 1.8 (ref 1.2–2.2)
Albumin: 4.6 g/dL (ref 3.8–4.9)
Alkaline Phosphatase: 80 IU/L (ref 44–121)
BUN/Creatinine Ratio: 13 (ref 9–20)
BUN: 14 mg/dL (ref 6–24)
Bilirubin Total: 0.3 mg/dL (ref 0.0–1.2)
CO2: 24 mmol/L (ref 20–29)
Calcium: 9.6 mg/dL (ref 8.7–10.2)
Chloride: 103 mmol/L (ref 96–106)
Creatinine, Ser: 1.05 mg/dL (ref 0.76–1.27)
Globulin, Total: 2.6 g/dL (ref 1.5–4.5)
Glucose: 91 mg/dL (ref 65–99)
Potassium: 4.7 mmol/L (ref 3.5–5.2)
Sodium: 144 mmol/L (ref 134–144)
Total Protein: 7.2 g/dL (ref 6.0–8.5)
eGFR: 85 mL/min/{1.73_m2} (ref >59)

## 2020-06-03 LAB — PHENYTOIN LEVEL, TOTAL: Phenytoin (Dilantin), Serum: 1.4 ug/mL — ABNORMAL LOW (ref 10.0–20.0)

## 2020-06-03 LAB — VALPROIC ACID LEVEL: Valproic Acid Lvl: 71 ug/mL (ref 50–100)

## 2020-06-08 ENCOUNTER — Telehealth: Payer: Self-pay

## 2020-06-08 NOTE — Telephone Encounter (Signed)
-----   Message from Suzzanne Cloud, NP sent at 06/06/2020  5:53 AM EDT ----- Please call the patient.  Blood work is unremarkable, is stable.

## 2020-06-08 NOTE — Telephone Encounter (Signed)
Pt verified by name and DOB,  normal results given per provider, pt voiced understanding all question answered. °

## 2020-06-09 ENCOUNTER — Other Ambulatory Visit (HOSPITAL_COMMUNITY): Payer: BC Managed Care – PPO

## 2020-06-09 ENCOUNTER — Ambulatory Visit (INDEPENDENT_AMBULATORY_CARE_PROVIDER_SITE_OTHER): Payer: BC Managed Care – PPO

## 2020-06-09 ENCOUNTER — Ambulatory Visit: Payer: BC Managed Care – PPO | Admitting: Neurology

## 2020-06-09 DIAGNOSIS — R011 Cardiac murmur, unspecified: Secondary | ICD-10-CM

## 2020-06-09 LAB — ECHOCARDIOGRAM COMPLETE
AR max vel: 0.81 cm2
AV Area VTI: 0.88 cm2
AV Area mean vel: 0.8 cm2
AV Mean grad: 38.3 mmHg
AV Peak grad: 68.8 mmHg
Ao pk vel: 4.15 m/s
Area-P 1/2: 2.86 cm2
Calc EF: 58.4 %
S' Lateral: 3.16 cm
Single Plane A2C EF: 56.2 %
Single Plane A4C EF: 63 %

## 2020-06-14 ENCOUNTER — Other Ambulatory Visit: Payer: BC Managed Care – PPO

## 2020-06-15 ENCOUNTER — Other Ambulatory Visit: Payer: Self-pay | Admitting: Family Medicine

## 2020-06-15 ENCOUNTER — Telehealth: Payer: Self-pay

## 2020-06-15 DIAGNOSIS — I350 Nonrheumatic aortic (valve) stenosis: Secondary | ICD-10-CM

## 2020-06-15 NOTE — Telephone Encounter (Signed)
Needs to see cardiology due to murmur. Referral written

## 2020-06-15 NOTE — Telephone Encounter (Signed)
RETURNED CALL, NO ANSWER, LEFT MESSAGE TO CALL BACK 

## 2020-06-21 NOTE — Telephone Encounter (Signed)
Patient aware.

## 2020-08-30 ENCOUNTER — Ambulatory Visit: Payer: BC Managed Care – PPO | Admitting: Cardiology

## 2020-10-24 ENCOUNTER — Ambulatory Visit: Payer: Self-pay | Admitting: Cardiology

## 2020-11-13 ENCOUNTER — Encounter: Payer: Self-pay | Admitting: Cardiology

## 2020-11-13 NOTE — Progress Notes (Deleted)
Cardiology Office Note  Date: 11/13/2020   ID: Jerry [name], DOB [birth year], MRN [id number]  PCP:  Claretta Fraise, MD  Cardiologist:  None Electrophysiologist:  None   No chief complaint on file.   History of Present Illness: Jerry [name] is a 55 y.o. male referred for cardiology consultation by Dr. Livia Snellen for the evaluation of aortic stenosis.  Echocardiogram obtained by Dr. Livia Snellen back in May revealed LVEF 65 to 70% with mild LVH, normal RV contraction, and mildly calcified bicuspid aortic valve with evidence of severe aortic stenosis including mean gradient 38 mmHg and dimensionless index 0.31.  Past Medical History:  Diagnosis Date   Aortic stenosis    Head injury    Mixed hyperlipidemia    Seizure ([place])    Skin cancer    Varicose veins    Vasculitis ([place]) 11/2014    Past Surgical History:  Procedure Laterality Date   Surgical plate in head     TOE INTERNAL FIXATION W/ COMPRESSION SCREW      Current Outpatient Medications  Medication Sig Dispense Refill   divalproex (DEPAKOTE ER) 500 MG 24 hr tablet Take 3 tablets in the morning and 3 tablets in the evening. 540 tablet 3   phenytoin (DILANTIN) 100 MG ER capsule Take 2 capsules (200 mg total) by mouth 2 (two) times daily. 120 capsule 11   rosuvastatin (CRESTOR) 10 MG tablet Take 1 tablet (10 mg total) by mouth daily. For cholesterol 90 tablet 1   Vitamin D, Ergocalciferol, (DRISDOL) 1.25 MG (50000 UNIT) CAPS capsule Take 1 capsule (50,000 Units total) by mouth every 7 (seven) days. 13 capsule 3   No current facility-administered medications for this visit.   Allergies:  Patient has no known allergies.   Social History: The patient  reports that he quit smoking about 6 years ago. He smoked an average of .5 packs per day. He has never used smokeless tobacco. He reports that he does not drink alcohol and does not use drugs.   Family History: The patient's family history includes Diabetes in his father.    ROS:  Please see the history of present illness. Otherwise, complete review of systems is positive for {NONE DEFAULTED:18576}.  All other systems are reviewed and negative.   Physical Exam: VS:  There were no vitals taken for this visit., BMI There is no height or weight on file to calculate BMI.  Wt Readings from Last 3 Encounters:  06/02/20 233 lb (105.7 kg)  05/11/20 232 lb 6.4 oz (105.4 kg)  12/07/19 231 lb 6 oz (105 kg)    General: Patient appears comfortable at rest. HEENT: Conjunctiva and lids normal, oropharynx clear with moist mucosa. Neck: Supple, no elevated JVP or carotid bruits, no thyromegaly. Lungs: Clear to auscultation, nonlabored breathing at rest. Cardiac: Regular rate and rhythm, no S3 or significant systolic murmur, no pericardial rub. Abdomen: Soft, nontender, no hepatomegaly, bowel sounds present, no guarding or rebound. Extremities: No pitting edema, distal pulses 2+. Skin: Warm and dry. Musculoskeletal: No kyphosis. Neuropsychiatric: Alert and oriented x3, affect grossly appropriate.  ECG:  An ECG dated 05/11/2020 was personally reviewed today and demonstrated:  Sinus rhythm with nonspecific ST-T changes.  Recent Labwork: 06/02/2020: ALT 15; AST 17; BUN 14; Creatinine, Ser 1.05; Hemoglobin 16.0; Platelets 223; Potassium 4.7; Sodium 144     Component Value Date/Time   CHOL 254 (H) 12/07/2019 1042   TRIG 245 (H) 12/07/2019 1042   HDL 36 (L) 12/07/2019 1042   CHOLHDL  7.1 (H) 12/07/2019 1042   LDLCALC 172 (H) 12/07/2019 1042    Other Studies Reviewed Today:  Echocardiogram 06/09/2020:  1. Left ventricular ejection fraction, by estimation, is 65 to 70%. The  left ventricle has normal function. The left ventricle has no regional  wall motion abnormalities. There is mild left ventricular hypertrophy.  Left ventricular diastolic parameters  are indeterminate.   2. RV-RA gradient normal at 8 mmHg. Right ventricular systolic function  is normal. The right  ventricular size is normal.   3. The mitral valve is grossly normal. Trivial mitral valve  regurgitation.   4. The aortic valve is bicuspid. There is mild calcification of the  aortic valve. Aortic valve regurgitation is not visualized. Severe aortic  valve stenosis. Aortic valve mean gradient measures 38.2 mmHg. Aortic  valve Vmax measures 4.15 m/s.  Dimentionless index 0.31.   5. Unable to estimate CVP.   Assessment and Plan:    Medication Adjustments/Labs and Tests Ordered: Current medicines are reviewed at length with the patient today.  Concerns regarding medicines are outlined above.   Tests Ordered: No orders of the defined types were placed in this encounter.   Medication Changes: No orders of the defined types were placed in this encounter.   Disposition:  Follow up {follow up:15908}  Signed, Satira Sark, MD, [facility] 11/13/2020 7:39 PM    [place] [facility] [place], [place], [place] [address] Phone: [phone]; Fax: [phone]

## 2020-11-14 ENCOUNTER — Ambulatory Visit: Payer: Self-pay | Admitting: Cardiology

## 2020-11-14 DIAGNOSIS — I350 Nonrheumatic aortic (valve) stenosis: Secondary | ICD-10-CM

## 2020-11-28 ENCOUNTER — Telehealth: Payer: Self-pay | Admitting: Neurology

## 2020-11-28 NOTE — Telephone Encounter (Signed)
Pt called wanting to know what are medications he can and cannot take that will trigger a seizure. Pt requesting a call back.

## 2020-11-28 NOTE — Telephone Encounter (Signed)
I called patient. He has a sore throat, runny nose, and watery eyes. He wants to take zyrtec. He spoke with his pharmacist who said it would be ok to take zyrtec but he is wary of taking anti-histamines with his seizure history. He would like to make sure Dr. Krista Blue is ok with this. I will discuss this with her and call him back.

## 2020-11-29 NOTE — Telephone Encounter (Signed)
I spoke with Dr. Krista Blue. Zyrtec should be ok for the patient.  I called patient to discuss. No answer, left a message asking him to call me back.

## 2020-11-29 NOTE — Telephone Encounter (Signed)
Left detailed voiced message for pt, if he has further questions he may call our office.

## 2020-12-05 ENCOUNTER — Telehealth: Payer: Self-pay | Admitting: Neurology

## 2020-12-05 NOTE — Telephone Encounter (Signed)
I spoke with Dr. Krista Blue and patient can take any OTC cold medication. They will not interact with any of his current medications or cause him to have a seizure.

## 2020-12-05 NOTE — Telephone Encounter (Signed)
Pt called wanting to know what kind of medication can he take to get rid of his old but wont interact with the meds he is currently on. Pt requesting a call back.

## 2020-12-05 NOTE — Telephone Encounter (Signed)
Pt called, what medication can take to get rid of a cold. Do not want to take anything that will counteract the medications currently taking for my condition. Would like a call from the nurse.

## 2020-12-05 NOTE — Telephone Encounter (Signed)
Spoke to pt, relayed message from Dr Krista Blue. Pt voiced understanding and appreciated call.

## 2020-12-07 ENCOUNTER — Encounter: Payer: BC Managed Care – PPO | Admitting: Family Medicine

## 2021-03-22 ENCOUNTER — Other Ambulatory Visit: Payer: Self-pay | Admitting: Family Medicine

## 2021-06-05 ENCOUNTER — Ambulatory Visit: Payer: BC Managed Care – PPO | Admitting: Neurology

## 2021-06-06 ENCOUNTER — Encounter: Payer: Self-pay | Admitting: Neurology

## 2021-06-06 ENCOUNTER — Ambulatory Visit: Payer: Self-pay | Admitting: Neurology

## 2021-06-06 NOTE — Progress Notes (Deleted)
PATIENT: Jerry [name] DOB: Jun 12, 1965  REASON FOR VISIT: follow up HISTORY FROM: patient  HISTORY: ASHAWN [name] is a 56 year old right-handed Caucasian male, alone at visit, to followup his seizure disorder Last clinical visit was our office was in November 2013 with [name], he had a history of seizure disorder since subdural hematoma after a traumatic brain injury in 1984, previous abnormal EEG, he also had some anxiety disorder, he is no longer working. He lives with his mother, driving, last clinical seizure was September 2011, He has been on long-term combination therapy of Depakote ER 500 mg 2 tablets twice a day, high dose of Dilantin 100 mg 3 tablets twice a day, plus chewable 50 mg once a day He is getting assistance program for Depakote ER, coordinated through our office He is overall doing well, tolerating medications, no recurrent seizure   UPDATE 04/26/18CMMr. Jerry [name] 56 year old male returns for follow-up. He has a history of seizure disorder and is currently on Depakote from  patient assistance and Dilantin 200 mg at night. Last seizure activity was 2011. He has had no new medical issues since last seen. He drives a car without difficulty. He returns for reevaluation and he needs labs to monitor any adverse effects of his seizure medications. UPDATE 5/9/2019CM Mr. Jerry [name], 56 year old male returns for follow-up with history of seizure disorder.  Last seizure occurred in September 2011.  Patient currently on Depakote '500mg'$  3 tabsTwice daily and Dilantin 100 mg 2 capsules twice daily.  No new medical issues since last seen.  He returns for reevaluation, labs and refills  Update January 22, 2019 SS: Mr. Jerry [name] is a 56 year old male with history of seizure disorder.  He has patient assistance for Depakote.  He remains on Depakote ER 500 mg, 3 tablets twice daily, Dilantin 100 mg ER, 2 tablets twice a day.  He says his last seizure was about 10 years ago.  He is  tolerating his medications without side effect.  He denies any new problems or concerns.  He says he does have chronic ringing in his right ear, as result of hearing loss.  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  He denies any changes to his balance or gait.  He presents today for evaluation unaccompanied.  Update June 02, 2020 SS: Last seen in Dec 2020, no seizures. Was able to switch to generic Depakote ER 500 mg, 3 tablet twice daily. Works in Armed forces technical officer, Nature conservation officer, works scales. Prescribed Dilantin 100 mg, 2 twice daily. But actually only taking 200 mg at bedtime, for 3 years. Is on generic for both. He drives. Last seizure was 10 years ago. Lives with mom, she has poor health. Found heart murmur, having echo soon.  Not interested in weaning off Dilantin at this point.   Update Jun 06, 2021 SS:   REVIEW OF SYSTEMS: Out of a complete 14 system review of symptoms, the patient complains only of the following symptoms, and all other reviewed systems are negative.  Seizures   ALLERGIES: No Known Allergies  HOME MEDICATIONS: Outpatient Medications Prior to Visit  Medication Sig Dispense Refill   divalproex (DEPAKOTE ER) 500 MG 24 hr tablet Take 3 tablets in the morning and 3 tablets in the evening. 540 tablet 3   phenytoin (DILANTIN) 100 MG ER capsule Take 2 capsules (200 mg total) by mouth 2 (two) times daily. 120 capsule 11   rosuvastatin (CRESTOR) 10 MG tablet Take 1 tablet (  10 mg total) by mouth daily. for cholesterol. (NEEDS TO BE SEEN BEFORE NEXT REFILL) 30 tablet 0   No facility-administered medications prior to visit.    PAST MEDICAL HISTORY: Past Medical History:  Diagnosis Date   Aortic stenosis    Head injury    Subdural hematoma after a traumatic brain injury in 1984   Mixed hyperlipidemia    Seizure ([place])    Skin cancer    Varicose veins    Vasculitis ([place]) 11/2014    PAST SURGICAL HISTORY: Past Surgical History:   Procedure Laterality Date   Surgical plate in head     TOE INTERNAL FIXATION W/ COMPRESSION SCREW      FAMILY HISTORY: Family History  Problem Relation Age of Onset   Diabetes Father     SOCIAL HISTORY: Social History   Socioeconomic History   Marital status: Married    Spouse name: Not on file   Number of children: 0   Years of education: 12   Highest education level: Not on file  Occupational History    Comment: Not working  Tobacco Use   Smoking status: Former    Packs/day: 0.50    Types: Cigarettes    Quit date: 10/21/2014    Years since quitting: 6.6   Smokeless tobacco: Never  Vaping Use   Vaping Use: Never used  Substance and Sexual Activity   Alcohol use: No    Alcohol/week: 0.0 standard drinks   Drug use: No   Sexual activity: Not on file  Other Topics Concern   Not on file  Social History Narrative   Patient is currently not working,High school education some college. Patient  Is single and lives with his mother.    Right handed.   Caffeine one cup daily.   Social Determinants of Health   Financial Resource Strain: Not on file  Food Insecurity: Not on file  Transportation Needs: Not on file  Physical Activity: Not on file  Stress: Not on file  Social Connections: Not on file  Intimate Partner Violence: Not on file   PHYSICAL EXAM  There were no vitals filed for this visit.  There is no height or weight on file to calculate BMI.  Generalized: Well developed, in no acute distress   Neurological examination  Mentation: Alert oriented to time, place, history taking. Follows all commands speech and language fluent Cranial nerve II-XII: Pupils were equal round reactive to light. Extraocular movements were full, visual field were full on confrontational test. Facial sensation and strength were normal. Head turning and shoulder shrug  were normal and symmetric. Motor: The motor testing reveals 5 over 5 strength of all 4 extremities. Good symmetric  motor tone is noted throughout.  Sensory: Sensory testing is intact to soft touch on all 4 extremities. No evidence of extinction is noted.  Coordination: Cerebellar testing reveals good finger-nose-finger and heel-to-shin bilaterally.  Gait and station: Gait is normal. Tandem gait is normal.   Reflexes: Deep tendon reflexes are symmetric and normal bilaterally.   DIAGNOSTIC DATA (LABS, IMAGING, TESTING) - I reviewed patient records, labs, notes, testing and imaging myself where available.  Lab Results  Component Value Date   WBC 9.1 06/02/2020   HGB 16.0 06/02/2020   HCT 47.8 06/02/2020   MCV 98 (H) 06/02/2020   PLT 223 06/02/2020      Component Value Date/Time   NA 144 06/02/2020 0841   K 4.7 06/02/2020 0841   CL 103 06/02/2020 0841   CO2 24  06/02/2020 0841   GLUCOSE 91 06/02/2020 0841   BUN 14 06/02/2020 0841   CREATININE 1.05 06/02/2020 0841   CALCIUM 9.6 06/02/2020 0841   PROT 7.2 06/02/2020 0841   ALBUMIN 4.6 06/02/2020 0841   AST 17 06/02/2020 0841   ALT 15 06/02/2020 0841   ALKPHOS 80 06/02/2020 0841   BILITOT 0.3 06/02/2020 0841   GFRNONAA 91 12/07/2019 1042   GFRAA 105 12/07/2019 1042   Lab Results  Component Value Date   CHOL 254 (H) 12/07/2019   HDL 36 (L) 12/07/2019   LDLCALC 172 (H) 12/07/2019   TRIG 245 (H) 12/07/2019   CHOLHDL 7.1 (H) 12/07/2019   No results found for: HGBA1C No results found for: VITAMINB12 Lab Results  Component Value Date   TSH 2.070 03/19/2017      ASSESSMENT AND PLAN 56 y.o. year old male  has a past medical history of Aortic stenosis, Head injury, Mixed hyperlipidemia, Seizure ([place]), Skin cancer, Varicose veins, and Vasculitis ([place]) (11/2014). here with:  1. Seizures  -No seizures in about 10 years -Doing well on generic Dilantin and Depakote -Since excellent seizure control, we discussed weaning off Dilantin, he is not interested at this time -Continue Dilantin 100 mg, 2 capsules twice daily, but is actually only  taking 200 mg daily -Continue Depakote ER 500 mg, 3 tablets twice daily -Lengthy discussion about using Good Rx, should be able to get a 1 month supply of Depakote (180 tablets) for $ 50 at [facility], was given a printed prescription today -Check routine labs today -He will call for recurrent seizure, follow-up in 1 year or sooner if needed    Evangeline Dakin, DNP 06/06/2021, 5:48 AM [facility] [address] [place], [place] [address] [phone]

## 2021-06-21 ENCOUNTER — Ambulatory Visit: Payer: Self-pay | Admitting: Neurology

## 2021-06-21 NOTE — Progress Notes (Deleted)
PATIENT: Jerry [name] DOB: 11-01-1965  REASON FOR VISIT: follow up for seizures  HISTORY FROM: patient PRIMARY NEUROLOGIST: Dr. 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 He lives with his mother, driving, last clinical seizure was September 2011, He has been on long-term combination therapy of Depakote ER 500 mg 2 tablets twice a day, high dose of Dilantin 100 mg 3 tablets twice a day, plus chewable 50 mg once a day He is getting assistance program for Depakote ER, coordinated through our office He is overall doing well, tolerating medications, no recurrent seizure   UPDATE 04/26/18CMMr. Jerry [name] 56 year old male returns for follow-up. He has a history of seizure disorder and is currently on Depakote from  patient assistance and Dilantin 200 mg at night. Last seizure activity was 2011. He has had no new medical issues since last seen. He drives a car without difficulty. He returns for reevaluation and he needs labs to monitor any adverse effects of his seizure medications. UPDATE 5/9/2019[name], 56 year old male returns for follow-up with history of seizure disorder.  Last seizure occurred in September 2011.  Patient currently on Depakote '500mg'$  3 tabsTwice daily and Dilantin 100 mg 2 capsules twice daily.  No new medical issues since last seen.  He returns for reevaluation, labs and refills  Update January 22, 2019 [name] is a 56 year old male with history of seizure disorder.  He has patient assistance for Depakote.  He remains on Depakote ER 500 mg, 3 tablets twice daily, Dilantin 100 mg ER, 2 tablets twice a day.  He says his last  seizure was about 10 years ago.  He is tolerating his medications without side effect.  He denies any new problems or concerns.  He says he does have chronic ringing in his right ear, as result of hearing loss.  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  He denies any changes to his balance or gait.  He presents today for evaluation unaccompanied.  Update June 02, 2020 SS: Last seen in Dec 2020, no seizures. Was able to switch to generic Depakote ER 500 mg, 3 tablet twice daily. Works in Armed forces technical officer, Nature conservation officer, works scales. Prescribed Dilantin 100 mg, 2 twice daily. But actually only taking 200 mg at bedtime, for 3 years. Is on generic for both. He drives. Last seizure was 10 years ago. Lives with mom, she has poor health. Found heart murmur, having echo soon.  Not interested in weaning off Dilantin at this point.   Update Jun 21, 2021 SS:   REVIEW OF SYSTEMS: Out of a complete 14 system review of symptoms, the patient complains only of the following symptoms, and all other reviewed systems are negative.  Seizures   ALLERGIES: No Known Allergies  HOME MEDICATIONS: Outpatient Medications Prior to Visit  Medication Sig Dispense Refill   divalproex (DEPAKOTE ER) 500 MG 24 hr tablet Take 3 tablets in the morning and 3 tablets in the evening. 540 tablet 3   phenytoin (DILANTIN) 100 MG ER capsule Take 2 capsules (200 mg total) by mouth 2 (two) times daily. 120 capsule 11  rosuvastatin (CRESTOR) 10 MG tablet Take 1 tablet (10 mg total) by mouth daily. for cholesterol. (NEEDS TO BE SEEN BEFORE NEXT REFILL) 30 tablet 0   No facility-administered medications prior to visit.    PAST MEDICAL HISTORY: Past Medical History:  Diagnosis Date   Aortic stenosis    Head injury    Subdural hematoma after a traumatic brain injury in 1984   Mixed hyperlipidemia    Seizure ([place])    Skin cancer    Varicose veins    Vasculitis ([place]) 11/2014    PAST  SURGICAL HISTORY: Past Surgical History:  Procedure Laterality Date   Surgical plate in head     TOE INTERNAL FIXATION W/ COMPRESSION SCREW      FAMILY HISTORY: Family History  Problem Relation Age of Onset   Diabetes Father     SOCIAL HISTORY: Social History   Socioeconomic History   Marital status: Married    Spouse name: Not on file   Number of children: 0   Years of education: 12   Highest education level: Not on file  Occupational History    Comment: Not working  Tobacco Use   Smoking status: Former    Packs/day: 0.50    Types: Cigarettes    Quit date: 10/21/2014    Years since quitting: 6.6   Smokeless tobacco: Never  Vaping Use   Vaping Use: Never used  Substance and Sexual Activity   Alcohol use: No    Alcohol/week: 0.0 standard drinks   Drug use: No   Sexual activity: Not on file  Other Topics Concern   Not on file  Social History Narrative   Patient is currently not working,High school education some college. Patient  Is single and lives with his mother.    Right handed.   Caffeine one cup daily.   Social Determinants of Health   Financial Resource Strain: Not on file  Food Insecurity: Not on file  Transportation Needs: Not on file  Physical Activity: Not on file  Stress: Not on file  Social Connections: Not on file  Intimate Partner Violence: Not on file   PHYSICAL EXAM  There were no vitals filed for this visit.  There is no height or weight on file to calculate BMI.  Generalized: Well developed, in no acute distress   Neurological examination  Mentation: Alert oriented to time, place, history taking. Follows all commands speech and language fluent Cranial nerve II-XII: Pupils were equal round reactive to light. Extraocular movements were full, visual field were full on confrontational test. Facial sensation and strength were normal. Head turning and shoulder shrug  were normal and symmetric. Motor: The motor testing reveals 5 over 5  strength of all 4 extremities. Good symmetric motor tone is noted throughout.  Sensory: Sensory testing is intact to soft touch on all 4 extremities. No evidence of extinction is noted.  Coordination: Cerebellar testing reveals good finger-nose-finger and heel-to-shin bilaterally.  Gait and station: Gait is normal. Tandem gait is normal.   Reflexes: Deep tendon reflexes are symmetric and normal bilaterally.   DIAGNOSTIC DATA (LABS, IMAGING, TESTING) - I reviewed patient records, labs, notes, testing and imaging myself where available.  Lab Results  Component Value Date   WBC 9.1 06/02/2020   HGB 16.0 06/02/2020   HCT 47.8 06/02/2020   MCV 98 (H) 06/02/2020   PLT 223 06/02/2020      Component Value Date/Time   NA 144 06/02/2020 0841   K 4.7 06/02/2020 0841  CL 103 06/02/2020 0841   CO2 24 06/02/2020 0841   GLUCOSE 91 06/02/2020 0841   BUN 14 06/02/2020 0841   CREATININE 1.05 06/02/2020 0841   CALCIUM 9.6 06/02/2020 0841   PROT 7.2 06/02/2020 0841   ALBUMIN 4.6 06/02/2020 0841   AST 17 06/02/2020 0841   ALT 15 06/02/2020 0841   ALKPHOS 80 06/02/2020 0841   BILITOT 0.3 06/02/2020 0841   GFRNONAA 91 12/07/2019 1042   GFRAA 105 12/07/2019 1042   Lab Results  Component Value Date   CHOL 254 (H) 12/07/2019   HDL 36 (L) 12/07/2019   LDLCALC 172 (H) 12/07/2019   TRIG 245 (H) 12/07/2019   CHOLHDL 7.1 (H) 12/07/2019   No results found for: HGBA1C No results found for: VITAMINB12 Lab Results  Component Value Date   TSH 2.070 03/19/2017   ASSESSMENT AND PLAN 56 y.o. year old male  has a past medical history of Aortic stenosis, Head injury, Mixed hyperlipidemia, Seizure ([place]), Skin cancer, Varicose veins, and Vasculitis ([place]) (11/2014). here with:  1. Seizures  -No seizures in about 10 years -Doing well on generic Dilantin and Depakote -Since excellent seizure control, we discussed weaning off Dilantin, he is not interested at this time -Continue Dilantin 100 mg, 2  capsules twice daily, but is actually only taking 200 mg daily -Continue Depakote ER 500 mg, 3 tablets twice daily -Lengthy discussion about using Good Rx, should be able to get a 1 month supply of Depakote (180 tablets) for $ 50 at [facility], was given a printed prescription today -Check routine labs today -He will call for recurrent seizure, follow-up in 1 year or sooner if needed  Butler Denmark, Laqueta Jean, DNP 06/21/2021, 5:24 AM [facility] [address], [place], [place] [address] [phone]

## 2021-06-26 ENCOUNTER — Other Ambulatory Visit: Payer: Self-pay | Admitting: Neurology

## 2021-08-07 ENCOUNTER — Other Ambulatory Visit: Payer: Self-pay | Admitting: Family Medicine

## 2021-08-09 ENCOUNTER — Encounter: Payer: Self-pay | Admitting: Neurology

## 2021-08-09 ENCOUNTER — Ambulatory Visit (INDEPENDENT_AMBULATORY_CARE_PROVIDER_SITE_OTHER): Payer: Self-pay | Admitting: Neurology

## 2021-08-09 VITALS — BP 156/90 | HR 73 | Ht 70.0 in | Wt 238.0 lb

## 2021-08-09 DIAGNOSIS — G40909 Epilepsy, unspecified, not intractable, without status epilepticus: Secondary | ICD-10-CM

## 2021-08-09 MED ORDER — PHENYTOIN SODIUM EXTENDED 100 MG PO CAPS: 200.0000 mg | ORAL_CAPSULE | Freq: Two times a day (BID) | ORAL | 11 refills | Status: DC

## 2021-08-09 MED ORDER — DIVALPROEX SODIUM ER 500 MG PO TB24: ORAL_TABLET | ORAL | 3 refills | Status: DC

## 2021-08-09 NOTE — Patient Instructions (Signed)
Check labs  Continue current medications

## 2021-08-09 NOTE — Progress Notes (Signed)
PATIENT: Jerry [name] DOB: 04-08-1965  REASON FOR VISIT: follow up for seizures  HISTORY FROM: patient PRIMARY NEUROLOGIST: Krista Blue  HISTORY: Jerry [name] is a 56 year old right-handed Caucasian male, alone at visit, to followup his seizure disorder Last clinical visit was our office was in November 2013 with Jerry [name], he had a history of seizure disorder since subdural hematoma after a traumatic brain injury in 1984, previous abnormal EEG, he also had some anxiety disorder, he is no longer working. He lives with his mother, driving, last clinical seizure was September 2011, He has been on long-term combination therapy of Depakote ER 500 mg 2 tablets twice a day, high dose of Dilantin 100 mg 3 tablets twice a day, plus chewable 50 mg once a day He is getting assistance program for Depakote ER, coordinated through our office He is overall doing well, tolerating medications, no recurrent seizure   UPDATE 04/26/18[name] 56 year old male returns for follow-up. He has a history of seizure disorder and is currently on Depakote from  patient assistance and Dilantin 200 mg at night. Last seizure activity was 2011. He has had no new medical issues since last seen. He drives a car without difficulty. He returns for reevaluation and he needs labs to monitor any adverse effects of his seizure medications. UPDATE 5/9/2019[name], 56 year old male returns for follow-up with history of seizure disorder.  Last seizure occurred in September 2011.  Patient currently on Depakote '500mg'$  3 tabsTwice daily and Dilantin 100 mg 2 capsules twice daily.  No new medical issues since last seen.  He returns for reevaluation, labs and refills  Update January 22, 2019 [name] is a 56 year old male with history of seizure disorder.  He has patient assistance for Depakote.  He remains on Depakote ER 500 mg, 3 tablets twice daily, Dilantin 100 mg ER, 2 tablets twice a day.  He says his last seizure  was about 10 years ago.  He is tolerating his medications without side effect.  He denies any new problems or concerns.  He says he does have chronic ringing in his right ear, as result of hearing loss.  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  He denies any changes to his balance or gait.  He presents today for evaluation unaccompanied.  Update June 02, 2020 SS: Last seen in Dec 2020, no seizures. Was able to switch to generic Depakote ER 500 mg, 3 tablet twice daily. Works in Armed forces technical officer, Nature conservation officer, works scales. Prescribed Dilantin 100 mg, 2 twice daily. But actually only taking 200 mg at bedtime, for 3 years. Is on generic for both. He drives. Last seizure was 10 years ago. Lives with mom, she has poor health. Found heart murmur, having echo soon.  Not interested in weaning off Dilantin at this point.   Update 08/09/21 SS: At last visit Depakote level was 71, Dilantin 1.4. No seizures. Last was 12 years ago. He isn't working, looking for a new job. Cares for his mother. He drives. No new issues. Isn't interested in tapering off Dilantin.   REVIEW OF SYSTEMS: Out of a complete 14 system review of symptoms, the patient complains only of the following symptoms, and all other reviewed systems are negative.  See HPI  ALLERGIES: No Known Allergies  HOME MEDICATIONS: Outpatient Medications Prior to Visit  Medication Sig Dispense Refill   divalproex (DEPAKOTE ER) 500 MG 24 hr tablet TAKE 3 TABLETS BY MOUTH  IN THE MORNING AND 3 IN THE EVENING 540 tablet 1   phenytoin (DILANTIN) 100 MG ER capsule Take 2 capsules (200 mg total) by mouth 2 (two) times daily. 120 capsule 11   rosuvastatin (CRESTOR) 10 MG tablet Take 1 tablet (10 mg total) by mouth daily. for cholesterol. (NEEDS TO BE SEEN BEFORE NEXT REFILL) (Patient not taking: Reported on 08/09/2021) 30 tablet 0   No facility-administered medications prior to visit.    PAST MEDICAL HISTORY: Past Medical  History:  Diagnosis Date   Aortic stenosis    Head injury    Subdural hematoma after a traumatic brain injury in 1984   Mixed hyperlipidemia    Seizure ([place])    Skin cancer    Varicose veins    Vasculitis ([place]) 11/2014    PAST SURGICAL HISTORY: Past Surgical History:  Procedure Laterality Date   Surgical plate in head     TOE INTERNAL FIXATION W/ COMPRESSION SCREW      FAMILY HISTORY: Family History  Problem Relation Age of Onset   Diabetes Father     SOCIAL HISTORY: Social History   Socioeconomic History   Marital status: Married    Spouse name: Not on file   Number of children: 0   Years of education: 12   Highest education level: Not on file  Occupational History    Comment: Not working  Tobacco Use   Smoking status: Former    Packs/day: 0.50    Types: Cigarettes    Quit date: 10/21/2014    Years since quitting: 6.8   Smokeless tobacco: Never  Vaping Use   Vaping Use: Never used  Substance and Sexual Activity   Alcohol use: No    Alcohol/week: 0.0 standard drinks of alcohol   Drug use: No   Sexual activity: Not on file  Other Topics Concern   Not on file  Social History Narrative   Patient is currently not working,High school education some college. Patient  Is single and lives with his mother.    Right handed.   Caffeine one cup daily.   Social Determinants of Health   Financial Resource Strain: Not on file  Food Insecurity: Not on file  Transportation Needs: Not on file  Physical Activity: Not on file  Stress: Not on file  Social Connections: Not on file  Intimate Partner Violence: Not on file   PHYSICAL EXAM  Vitals:   08/09/21 1325  BP: (!) 156/90  Pulse: 73  Weight: 238 lb (108 kg)  Height: '5\' 10"'$  (1.778 m)    Body mass index is 34.15 kg/m.  Generalized: Well developed, in no acute distress   Neurological examination  Mentation: Alert oriented to time, place, history taking. Follows all commands speech and language  fluent Cranial nerve II-XII: Pupils were equal round reactive to light. Extraocular movements were full, visual field were full on confrontational test. Facial sensation and strength were normal. Head turning and shoulder shrug  were normal and symmetric. Motor: The motor testing reveals 5 over 5 strength of all 4 extremities. Good symmetric motor tone is noted throughout.  Sensory: Sensory testing is intact to soft touch on all 4 extremities. No evidence of extinction is noted.  Coordination: Cerebellar testing reveals good finger-nose-finger and heel-to-shin bilaterally.  Gait and station: Gait is normal.  Reflexes: Deep tendon reflexes are symmetric and normal bilaterally.   DIAGNOSTIC DATA (LABS, IMAGING, TESTING) - I reviewed patient records, labs, notes, testing and imaging myself where available.  Lab Results  Component Value Date   WBC 9.1 06/02/2020   HGB 16.0 06/02/2020   HCT 47.8 06/02/2020   MCV 98 (H) 06/02/2020   PLT 223 06/02/2020      Component Value Date/Time   NA 144 06/02/2020 0841   K 4.7 06/02/2020 0841   CL 103 06/02/2020 0841   CO2 24 06/02/2020 0841   GLUCOSE 91 06/02/2020 0841   BUN 14 06/02/2020 0841   CREATININE 1.05 06/02/2020 0841   CALCIUM 9.6 06/02/2020 0841   PROT 7.2 06/02/2020 0841   ALBUMIN 4.6 06/02/2020 0841   AST 17 06/02/2020 0841   ALT 15 06/02/2020 0841   ALKPHOS 80 06/02/2020 0841   BILITOT 0.3 06/02/2020 0841   GFRNONAA 91 12/07/2019 1042   GFRAA 105 12/07/2019 1042   Lab Results  Component Value Date   CHOL 254 (H) 12/07/2019   HDL 36 (L) 12/07/2019   LDLCALC 172 (H) 12/07/2019   TRIG 245 (H) 12/07/2019   CHOLHDL 7.1 (H) 12/07/2019   No results found for: "HGBA1C" No results found for: "VITAMINB12" Lab Results  Component Value Date   TSH 2.070 03/19/2017    ASSESSMENT AND PLAN 56 y.o. year old male  1. Seizures  -Doing well, last seizure was more than 10 years ago -Check labs today -Continue current medications,  not interested in tapering off Dilantin (right now only takes 200 mg at bedtime) -Call for seizures, return back in 1 year  Meds ordered this encounter  Medications   divalproex (DEPAKOTE ER) 500 MG 24 hr tablet    Sig: TAKE 3 TABLETS BY MOUTH IN THE MORNING AND 3 IN THE EVENING    Dispense:  540 tablet    Refill:  3   phenytoin (DILANTIN) 100 MG ER capsule    Sig: Take 2 capsules (200 mg total) by mouth 2 (two) times daily.    Dispense:  120 capsule    Refill:  [address], [place], [place] 08/09/2021, 1:52 PM [facility] [address] [place], [place] [address] [phone]

## 2021-08-10 LAB — CBC WITH DIFFERENTIAL/PLATELET
Basophils Absolute: 0.1 10*3/uL (ref 0.0–0.2)
Basos: 1 %
EOS (ABSOLUTE): 0.1 10*3/uL (ref 0.0–0.4)
Eos: 1 %
Hematocrit: 44.2 % (ref 37.5–51.0)
Hemoglobin: 15.5 g/dL (ref 13.0–17.7)
Immature Grans (Abs): 0.1 10*3/uL (ref 0.0–0.1)
Immature Granulocytes: 1 %
Lymphocytes Absolute: 2.9 10*3/uL (ref 0.7–3.1)
Lymphs: 33 %
MCH: 33 pg (ref 26.6–33.0)
MCHC: 35.1 g/dL (ref 31.5–35.7)
MCV: 94 fL (ref 79–97)
Monocytes Absolute: 0.7 10*3/uL (ref 0.1–0.9)
Monocytes: 8 %
Neutrophils Absolute: 4.9 10*3/uL (ref 1.4–7.0)
Neutrophils: 56 %
Platelets: 213 10*3/uL (ref 150–450)
RBC: 4.7 x10E6/uL (ref 4.14–5.80)
RDW: 13 % (ref 11.6–15.4)
WBC: 8.8 10*3/uL (ref 3.4–10.8)

## 2021-08-10 LAB — COMPREHENSIVE METABOLIC PANEL
ALT: 29 IU/L (ref 0–44)
AST: 28 IU/L (ref 0–40)
Albumin/Globulin Ratio: 1.7 (ref 1.2–2.2)
Albumin: 4.5 g/dL (ref 3.8–4.9)
Alkaline Phosphatase: 89 IU/L (ref 44–121)
BUN/Creatinine Ratio: 9 (ref 9–20)
BUN: 10 mg/dL (ref 6–24)
Bilirubin Total: 0.3 mg/dL (ref 0.0–1.2)
CO2: 23 mmol/L (ref 20–29)
Calcium: 9.6 mg/dL (ref 8.7–10.2)
Chloride: 102 mmol/L (ref 96–106)
Creatinine, Ser: 1.1 mg/dL (ref 0.76–1.27)
Globulin, Total: 2.7 g/dL (ref 1.5–4.5)
Glucose: 101 mg/dL — ABNORMAL HIGH (ref 70–99)
Potassium: 3.8 mmol/L (ref 3.5–5.2)
Sodium: 142 mmol/L (ref 134–144)
Total Protein: 7.2 g/dL (ref 6.0–8.5)
eGFR: 80 mL/min/{1.73_m2} (ref >59)

## 2021-08-10 LAB — PHENYTOIN LEVEL, TOTAL: Phenytoin (Dilantin), Serum: 1.4 ug/mL — ABNORMAL LOW (ref 10.0–20.0)

## 2021-08-10 LAB — VALPROIC ACID LEVEL: Valproic Acid Lvl: 89 ug/mL (ref 50–100)

## 2021-08-15 ENCOUNTER — Telehealth: Payer: Self-pay | Admitting: Neurology

## 2021-08-15 NOTE — Telephone Encounter (Signed)
Pt called checking on results for bloodwork. Would like a call from the nurse.

## 2021-08-15 NOTE — Telephone Encounter (Signed)
Pt verified by name and DOB,  normal results given per provider, pt voiced understanding all question answered. °

## 2021-10-31 ENCOUNTER — Ambulatory Visit: Payer: Self-pay | Admitting: Neurology

## 2022-06-26 ENCOUNTER — Telehealth: Payer: Self-pay

## 2022-06-26 NOTE — Transitions of Care (Post Inpatient/ED Visit) (Signed)
   06/26/2022  Name: Jerry [name] MRN: [id number] DOB: 1965-05-25  Today's TOC FU Call Status: Today's TOC FU Call Status:: Successful TOC FU Call Competed TOC FU Call Complete Date: 06/26/22  Transition Care Management Follow-up Telephone Call Date of Discharge: 06/25/22 Discharge Facility: Other ([facility]) Name of Other ([facility]) Discharge Facility: [facility] Type of Discharge: Emergency Department Reason for ED Visit: Other: (sinusitis) How have you been since you were released from the hospital?: Better Any questions or concerns?: No  Items Reviewed: Did you receive and understand the discharge instructions provided?: Yes Medications obtained,verified, and reconciled?: Yes (Medications Reviewed) Any new allergies since your discharge?: No Dietary orders reviewed?: Yes Do you have support at home?: No  Medications Reviewed Today: Medications Reviewed Today     Reviewed by Karena Addison, LPN (Licensed Practical Nurse) on 06/26/22 at 1450  Med List Status: <None>   Medication Order Taking? Sig Documenting Provider Last Dose Status Informant  divalproex (DEPAKOTE ER) 500 MG 24 hr tablet [id number] Yes TAKE 3 TABLETS BY MOUTH IN THE MORNING AND 3 IN THE Alcus Dad, NP Taking Active   phenytoin (DILANTIN) 100 MG ER capsule [id number] Yes Take 2 capsules (200 mg total) by mouth 2 (two) times daily. Glean Salvo, NP Taking Active   rosuvastatin (CRESTOR) 10 MG tablet [id number] No Take 1 tablet (10 mg total) by mouth daily. for cholesterol. (NEEDS TO BE SEEN BEFORE NEXT REFILL)  Patient not taking: Reported on 08/09/2021   Mechele Claude, MD Not Taking Active             Home Care and Equipment/Supplies: Were Home Health Services Ordered?: NA Any new equipment or medical supplies ordered?: NA  Functional Questionnaire: Do you need assistance with bathing/showering or dressing?: No Do you need assistance with meal preparation?: No Do you need  assistance with eating?: No Do you have difficulty maintaining continence: No Do you need assistance with getting out of bed/getting out of a chair/moving?: No Do you have difficulty managing or taking your medications?: No  Follow up appointments reviewed: PCP Follow-up appointment confirmed?: NA Specialist Hospital Follow-up appointment confirmed?: NA Do you need transportation to your follow-up appointment?: No Do you understand care options if your condition(s) worsen?: Yes-patient verbalized understanding    SIGNATURE Karena Addison, LPN [facility] Nurse Health Advisor Direct Dial [phone]

## 2022-07-05 ENCOUNTER — Encounter: Payer: Self-pay | Admitting: *Deleted

## 2022-07-06 ENCOUNTER — Encounter: Payer: Self-pay | Admitting: Internal Medicine

## 2022-07-06 ENCOUNTER — Ambulatory Visit: Payer: BC Managed Care – PPO | Attending: Internal Medicine | Admitting: Internal Medicine

## 2022-07-06 VITALS — BP 156/82 | HR 68 | Ht 70.0 in | Wt 234.4 lb

## 2022-07-06 DIAGNOSIS — I350 Nonrheumatic aortic (valve) stenosis: Secondary | ICD-10-CM

## 2022-07-06 DIAGNOSIS — Q231 Congenital insufficiency of aortic valve: Secondary | ICD-10-CM

## 2022-07-06 NOTE — Patient Instructions (Signed)
Medication Instructions:  Your physician recommends that you continue on your current medications as directed. Please refer to the Current Medication list given to you today.   Labwork: None  Testing/Procedures: Your physician has requested that you have an echocardiogram. Echocardiography is a painless test that uses sound waves to create images of your heart. It provides your doctor with information about the size and shape of your heart and how well your heart's chambers and valves are working. This procedure takes approximately one hour. There are no restrictions for this procedure. Please do NOT wear cologne, perfume, aftershave, or lotions (deodorant is allowed). Please arrive 15 minutes prior to your appointment time.   Follow-Up: Your physician recommends that you schedule a follow-up appointment in: 6 months  Any Other Special Instructions Will Be Listed Below (If Applicable).  If you need a refill on your cardiac medications before your next appointment, please call your pharmacy.

## 2022-07-06 NOTE — Progress Notes (Signed)
Cardiology Office Note  Date: 07/06/2022   ID: Jerry [name], DOB 08-19-1965, MRN [id number]  PCP:  Jerry Claude, MD  Cardiologist:  Jerry Bicker, MD Electrophysiologist:  None   Reason for Office Visit: Evaluation of severe aortic valve stenosis at the request of Dr. Mayford Knife   History of Present Illness: Jerry [name] is a 57 y.o. male known to have history of subdural hematoma after traumatic brain injury in 1994, seizure disorder, severe aortic valve stenosis was referred to cardiology clinic for management of severe AS.  Echocardiogram from 06/2020 showed normal LVEF, bicuspid aortic valve, severe aortic valve stenosis with mean gradient 38.2 mmHg, peak gradient 68.8 mmHg, valve area by VTI 0.80 cm and no evidence of aortic valve regurgitation was noted.  Aortic root was normal in size. Patient was no-show in 2022.  He presents today as a new patient visit.  He denies any symptoms of chest pain, DOE, dizziness, lightheadedness, syncope.  No palpitations.  He works as a Hospital doctor by profession.  Past Medical History:  Diagnosis Date   Aortic stenosis    Head injury    Subdural hematoma after a traumatic brain injury in 1984   Mixed hyperlipidemia    Seizure ([facility])    Skin cancer    Varicose veins    Vasculitis ([facility]) 11/2014    Past Surgical History:  Procedure Laterality Date   Surgical plate in head     TOE INTERNAL FIXATION W/ COMPRESSION SCREW      Current Outpatient Medications  Medication Sig Dispense Refill   amLODipine (NORVASC) 5 MG tablet Take 1 tablet by mouth daily.     divalproex (DEPAKOTE ER) 500 MG 24 hr tablet TAKE 3 TABLETS BY MOUTH IN THE MORNING AND 3 IN THE EVENING 540 tablet 3   phenytoin (DILANTIN) 100 MG ER capsule Take 2 capsules (200 mg total) by mouth 2 (two) times daily. 120 capsule 11   No current facility-administered medications for this visit.   Allergies:  Patient has no known allergies.   Social History: The patient   reports that he quit smoking about 7 years ago. He smoked an average of .5 packs per day. He has never used smokeless tobacco. He reports that he does not drink alcohol and does not use drugs.   Family History: The patient's family history includes Diabetes in his father.   ROS:  Please see the history of present illness. Otherwise, complete review of systems is positive for none  All other systems are reviewed and negative.   Physical Exam: VS:  BP (!) 156/82   Pulse 68   Ht 5\' 10"  (1.778 m)   Wt 234 lb 6.4 oz (106.3 kg)   SpO2 98%   BMI 33.63 kg/m , BMI Body mass index is 33.63 kg/m.  Wt Readings from Last 3 Encounters:  07/06/22 234 lb 6.4 oz (106.3 kg)  08/09/21 238 lb (108 kg)  06/02/20 233 lb (105.7 kg)    General: Patient appears comfortable at rest. HEENT: Conjunctiva and lids normal, oropharynx clear with moist mucosa. Neck: Supple, no elevated JVP or carotid bruits, no thyromegaly. Lungs: Clear to auscultation, nonlabored breathing at rest. Cardiac: Regular rate and rhythm, no S3 or significant systolic murmur, no pericardial rub. Abdomen: Soft, nontender, no hepatomegaly, bowel sounds present, no guarding or rebound. Extremities: No pitting edema, distal pulses 2+. Skin: Warm and dry. Musculoskeletal: No kyphosis. Neuropsychiatric: Alert and oriented x3, affect grossly appropriate.  Recent Labwork: 08/09/2021: ALT  29; AST 28; BUN 10; Creatinine, Ser 1.10; Hemoglobin 15.5; Platelets 213; Potassium 3.8; Sodium 142     Component Value Date/Time   CHOL 254 (H) 12/07/2019 1042   TRIG 245 (H) 12/07/2019 1042   HDL 36 (L) 12/07/2019 1042   CHOLHDL 7.1 (H) 12/07/2019 1042   LDLCALC 172 (H) 12/07/2019 1042    Other Studies Reviewed Today: Echocardiogram from 06/2020 Normal LVEF Bicuspid aortic valve Severe aortic valve stenosis  Assessment and Plan: Patient is a 57 year old M known to have history of subdural hematoma after traumatic brain injury in 1994, seizure  disorder, severe aortic valve stenosis, HTN was referred to cardiology clinic for management of severe AS.  # Severe stenosis of the bicuspid aortic valve -Echocardiogram from 06/2020 showed normal LVEF, bicuspid aortic valve, severe aortic valve stenosis with V-max 4.15 m/s, mean gradient 38.2 mmHg, peak gradient 68.8 mmHg, valve area by VTI 0.80 cm and no evidence of aortic valve regurgitation was noted.  Aortic root was normal in size.  -Asymptomatic currently, no chest pain, DOE, dizziness, lightheadedness, syncope. -Obtain 2D echocardiogram.  If velocity is more than 5 m/s, he will benefit from referral to structural heart clinic.  Depending on the echocardiogram, he might also benefit from exercise testing to elicit symptoms. He has no knee/hip issues. Okay with treadmill testing if needed. -ER precautions for chest pain, DOE, dizziness, lightheadedness and syncope provided  # HTN, controlled -Continue amlodipine 5 mg once daily  I have spent a total of 45 minutes with patient reviewing chart, EKGs, labs and examining patient as well as establishing an assessment and plan that was discussed with the patient.  > 50% of time was spent in direct patient care.    Medication Adjustments/Labs and Tests Ordered: Current medicines are reviewed at length with the patient today.  Concerns regarding medicines are outlined above.   Tests Ordered: Orders Placed This Encounter  Procedures   ECHOCARDIOGRAM COMPLETE    Medication Changes: No orders of the defined types were placed in this encounter.   Disposition:  Follow up  6 months  Signed Jerry [name] Verne Spurr, MD, 07/06/2022 11:39 AM    [facility] [address] [place], [place], [place] [address]

## 2022-07-24 ENCOUNTER — Telehealth: Payer: Self-pay | Admitting: Neurology

## 2022-07-24 NOTE — Telephone Encounter (Signed)
[facility] 07[facility]13[facility]2022 vitamin D 14.8, TSH 2.400, A1c 4.80 AST 16, ALT 15.  WBC 8.3, Hgb 15.9, platelet 194.

## 2022-08-14 ENCOUNTER — Ambulatory Visit: Payer: Self-pay | Admitting: Neurology

## 2022-08-24 ENCOUNTER — Ambulatory Visit (HOSPITAL_COMMUNITY)
Admission: RE | Admit: 2022-08-24 | Discharge: 2022-08-24 | Disposition: A | Discharge status: Home / Self Care | Payer: BC Managed Care – PPO | Source: Ambulatory Visit | Attending: Internal Medicine | Admitting: Internal Medicine

## 2022-08-24 DIAGNOSIS — I350 Nonrheumatic aortic (valve) stenosis: Secondary | ICD-10-CM | POA: Diagnosis not present

## 2022-08-24 LAB — ECHOCARDIOGRAM COMPLETE
AR max vel: 1.21 cm2
AV Area VTI: 1.36 cm2
AV Area mean vel: 1.25 cm2
AV Mean grad: 32 mmHg
AV Peak grad: 64.6 mmHg
Ao pk vel: 4.02 m/s
Area-P 1/2: 3.53 cm2
Calc EF: 64.4 %
S' Lateral: 2.8 cm
Single Plane A2C EF: 66.8 %
Single Plane A4C EF: 61.4 %

## 2022-08-24 NOTE — Progress Notes (Signed)
  Echocardiogram 2D Echocardiogram has been performed.  Jerry [name] 08/24/2022, 12:32 PM

## 2022-09-05 ENCOUNTER — Other Ambulatory Visit: Payer: Self-pay | Admitting: Neurology

## 2022-09-14 ENCOUNTER — Telehealth: Payer: Self-pay

## 2022-09-14 DIAGNOSIS — I350 Nonrheumatic aortic (valve) stenosis: Secondary | ICD-10-CM

## 2022-09-14 NOTE — Telephone Encounter (Signed)
Patient notified and verbalized understanding. ETT letter mailed to patient.

## 2022-09-14 NOTE — Telephone Encounter (Signed)
-----   Message from Vishnu P Mallipeddi sent at 09/12/2022  6:14 PM EDT ----- Normal pumping function of the heart, G1 DD (mild stiff heart), severe aortic valve stenosis based on V-max 4.02 m/s and DVI 0.23. Obtain exercise tolerance test to elicit symptoms of severe aortic valve stenosis.  Please place referral to structural heart cardiology as well.

## 2022-09-17 ENCOUNTER — Other Ambulatory Visit: Payer: Self-pay | Admitting: Neurology

## 2022-10-05 ENCOUNTER — Ambulatory Visit: Payer: BC Managed Care – PPO | Attending: Cardiovascular Disease | Admitting: Cardiovascular Disease

## 2022-10-05 ENCOUNTER — Encounter: Payer: Self-pay | Admitting: Cardiovascular Disease

## 2022-10-05 VITALS — BP 160/100 | HR 61 | Ht 70.0 in | Wt 228.2 lb

## 2022-10-05 DIAGNOSIS — I350 Nonrheumatic aortic (valve) stenosis: Secondary | ICD-10-CM | POA: Diagnosis not present

## 2022-10-05 MED ORDER — AMLODIPINE BESYLATE 10 MG PO TABS
10.0000 mg | ORAL_TABLET | Freq: Every day | ORAL | 3 refills | Status: DC
Start: 2022-10-05 — End: 2023-03-05

## 2022-10-05 NOTE — Progress Notes (Signed)
Cardiology Office Note:    Date:  10/05/2022   ID:  [name], DOB [birth year], MRN [id number]  PCP:  Donetta Potts, MD   [facility] Providers Cardiologist:  Marjo Bicker, MD     Referring MD: Mechele Claude, MD   Chief Complaint  Patient presents with   Aortic Stenosis    History of Present Illness:    Jerry [name] is a 57 y.o. male referred for evaluation of severe bicuspid aortic stenosis.   The patient is here alone today. 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 He works 4 days/week. He denies any symptoms. With his work, he carries moderately heavy things and is able to walk/carry without CP, exertional dyspnea, or lightheadedness.   The patient is single. He lives alone. States that he has no siblings and his parents are deceased. He has only known of a heart murmur for a few years.   Past Medical History:  Diagnosis Date   Aortic stenosis    Head injury    Subdural hematoma after a traumatic brain injury in 1984   Mixed hyperlipidemia    Seizure (HCC)    Skin cancer    Varicose veins    Vasculitis (HCC) 11/2014    Past Surgical History:  Procedure Laterality Date   Surgical plate in head     TOE INTERNAL FIXATION W/ COMPRESSION SCREW      Current Medications: Current Meds  Medication Sig   amLODipine (NORVASC) 10 MG tablet Take 1 tablet (10 mg total) by mouth daily.   divalproex (DEPAKOTE ER) 500 MG 24 hr tablet TAKE 3 TABLETS BY MOUTH IN THE MORNING AND 3 IN THE EVENING   phenytoin (DILANTIN) 100 MG ER capsule Take 2 capsules by mouth twice daily (Patient taking differently: Take 200 mg by mouth at bedtime.)   rosuvastatin (CRESTOR) 10 MG tablet SMARTSIG:1 Tablet(s) By Mouth Every Evening   [DISCONTINUED] amLODipine (NORVASC) 5 MG tablet Take 1 tablet by mouth daily.     Allergies:   Patient has no known allergies.   Social History   Socioeconomic History   Marital status: Married     Spouse name: Not on file   Number of children: 0   Years of education: 12   Highest education level: Not on file  Occupational History    Comment: Not working  Tobacco Use   Smoking status: Former    Current packs/day: 0.00    Types: Cigarettes    Quit date: 10/21/2014    Years since quitting: 7.9   Smokeless tobacco: Never  Vaping Use   Vaping status: Never Used  Substance and Sexual Activity   Alcohol use: No    Alcohol/week: 0.0 standard drinks of alcohol   Drug use: No   Sexual activity: Not on file  Other Topics Concern   Not on file  Social History Narrative   Patient is currently not [place] school education some college. Patient  Is single and lives with his mother.    Right handed.   Caffeine one cup daily.   Social Determinants of Health   Financial Resource Strain: Not on file  Food Insecurity: Not on file  Transportation Needs: Not on file  Physical Activity: Not on file  Stress: Not on file  Social Connections: Not on file     Family History: The patient's family history includes Diabetes in his father.  ROS:   Please see the history of  present illness.    All other systems reviewed and are negative.  EKGs/Labs/Other Studies Reviewed:    The following studies were reviewed today: EKG Interpretation Date/Time:  Friday October 05 2022 10:54:52 EDT Ventricular Rate:  61 PR Interval:  132 QRS Duration:  84 QT Interval:  400 QTC Calculation: 402 R Axis:   41  Text Interpretation: Normal sinus rhythm ST & T wave abnormality, consider inferior ischemia ST & T wave abnormality, consider anterolateral ischemia No previous ECGs available Confirmed by Tonny Bollman [id number]) on 10/05/2022 11:30:55 AM    Recent Labs: No results found for requested labs within last 365 days.  Recent Lipid Panel    Component Value Date/Time   CHOL 254 (H) 12/07/2019 1042   TRIG 245 (H) 12/07/2019 1042   HDL 36 (L) 12/07/2019 1042   CHOLHDL 7.1 (H) 12/07/2019 1042    LDLCALC 172 (H) 12/07/2019 1042     Risk Assessment/Calculations:           Physical Exam:    VS:  BP (!) 160/100 Comment: BP 150/100 Right arm  Pulse 61   Ht 5\' 10"  (1.778 m)   Wt 228 lb 3.2 oz (103.5 kg)   SpO2 98%   BMI 32.74 kg/m     Wt Readings from Last 3 Encounters:  10/05/22 228 lb 3.2 oz (103.5 kg)  07/06/22 234 lb 6.4 oz (106.3 kg)  08/09/21 238 lb (108 kg)     GEN:  Well nourished, well developed in no acute distress HEENT: Normal NECK: No JVD; No carotid bruits LYMPHATICS: No lymphadenopathy CARDIAC: RRR, 3/6 crescendo decrescendo murmur at the right upper sternal border, A2 is present RESPIRATORY:  Clear to auscultation without rales, wheezing or rhonchi  ABDOMEN: Soft, non-tender, non-distended MUSCULOSKELETAL:  No edema; No deformity  SKIN: Warm and dry NEUROLOGIC:  Alert and oriented x 3 PSYCHIATRIC:  Normal affect   ASSESSMENT:    1. Aortic valve stenosis, severe    PLAN:    In order of problems listed above:  The patient has moderate to severe stage C1 aortic stenosis with underlying bicuspid aortic valve disease.  He reports no symptoms whatsoever.  I personally reviewed his echo images today.  In 2022 he had an LVEF of 65 to 70%, peak and mean transaortic valve gradients of 69 and 38 mmHg, respectively, a peak transaortic velocity of 4.2 m/s, and a dimensionless index of 0.31.  His recent echo study demonstrates stable findings with LVEF 60 to 65%, peak and mean gradients of 65 and 32 mmHg respectively, a peak transaortic velocity of 4.0 m/s, and a dimensionless index of 0.23.  I reviewed the natural history of aortic stenosis with the patient today.  We discussed potential treatment options.  At his young age, he likely would be treated with conventional surgical aortic valve replacement.  However, I am not sure that he yet meets criteria for treatment of severe aortic stenosis as he is asymptomatic and has borderline echo data.  I think he should be  further risk stratified and I have recommended a exercise treadmill (not imaging) study, a BNP, and a gated cardiac CTA TAVR protocol to evaluate calcium score, valve opening, and better define his aortic valve anatomy.  Once his studies are completed, his data will be reviewed.  As long as all of this is reassuring and does not indicate a strong indication for intervention, I will plan to see him back in 1 year with an echo prior to his visit.  We discussed the cardinal symptoms of aortic stenosis today and he understands to seek immediate medical attention if he notices progressive shortness of breath, chest discomfort, or other symptoms of aortic stenosis.     Medication Adjustments/Labs and Tests Ordered: Current medicines are reviewed at length with the patient today.  Concerns regarding medicines are outlined above.  Orders Placed This Encounter  Procedures   Pro b natriuretic peptide (BNP)   EKG 12-Lead   ECHOCARDIOGRAM COMPLETE   Meds ordered this encounter  Medications   amLODipine (NORVASC) 10 MG tablet    Sig: Take 1 tablet (10 mg total) by mouth daily.    Dispense:  180 tablet    Refill:  3    Patient Instructions  Medication Instructions:  INCREASE Amlodipine to 10mg  daily *If you need a refill on your cardiac medications before your next appointment, please call your pharmacy*   Lab Work: Basic Naturetic Peptide (BMP) today If you have labs (blood work) drawn today and your tests are completely normal, you will receive your results only by: MyChart Message (if you have MyChart) OR A paper copy in the mail If you have any lab test that is abnormal or we need to change your treatment, we will call you to review the results.   Testing/Procedures: CTA's for TAVR (you will be contacted to schedule)  Exercise Treadmill test (already ordered to be done at [facility])  ECHO (in 1 year, prior to next appt with Dr Excell Seltzer) Your physician has requested that you have an  echocardiogram. Echocardiography is a painless test that uses sound waves to create images of your heart. It provides your doctor with information about the size and shape of your heart and how well your heart's chambers and valves are working. This procedure takes approximately one hour. There are no restrictions for this procedure. Please do NOT wear cologne, perfume, aftershave, or lotions (deodorant is allowed). Please arrive 15 minutes prior to your appointment time.  Follow-Up: At [facility], you and your health needs are our priority.  As part of our continuing mission to provide you with exceptional heart care, we have created designated Provider Care Teams.  These Care Teams include your primary Cardiologist (physician) and Advanced Practice Providers (APPs -  Physician Assistants and Nurse Practitioners) who all work together to provide you with the care you need, when you need it.  Your next appointment:   1 year(s)  Provider:   Tonny Bollman, MD     Signed, Tonny Bollman, MD  10/05/2022 12:57 PM    [facility]

## 2022-10-05 NOTE — Patient Instructions (Signed)
Medication Instructions:  INCREASE Amlodipine to 10mg  daily *If you need a refill on your cardiac medications before your next appointment, please call your pharmacy*   Lab Work: Basic Naturetic Peptide (BMP) today If you have labs (blood work) drawn today and your tests are completely normal, you will receive your results only by: MyChart Message (if you have MyChart) OR A paper copy in the mail If you have any lab test that is abnormal or we need to change your treatment, we will call you to review the results.   Testing/Procedures: CTA's for TAVR (you will be contacted to schedule)  Exercise Treadmill test (already ordered to be done at [facility])  ECHO (in 1 year, prior to next appt with Dr Excell Seltzer) Your physician has requested that you have an echocardiogram. Echocardiography is a painless test that uses sound waves to create images of your heart. It provides your doctor with information about the size and shape of your heart and how well your heart's chambers and valves are working. This procedure takes approximately one hour. There are no restrictions for this procedure. Please do NOT wear cologne, perfume, aftershave, or lotions (deodorant is allowed). Please arrive 15 minutes prior to your appointment time.  Follow-Up: At [facility], you and your health needs are our priority.  As part of our continuing mission to provide you with exceptional heart care, we have created designated Provider Care Teams.  These Care Teams include your primary Cardiologist (physician) and Advanced Practice Providers (APPs -  Physician Assistants and Nurse Practitioners) who all work together to provide you with the care you need, when you need it.  Your next appointment:   1 year(s)  Provider:   Tonny Bollman, MD

## 2022-10-05 NOTE — Progress Notes (Signed)
Pre Surgical Assessment: 5 M Walk Test  35M=16.31ft  5 Meter Walk Test- trial 1: 6.12 seconds 5 Meter Walk Test- trial 2: 5.90 seconds 5 Meter Walk Test- trial 3: 5.91 seconds 5 Meter Walk Test Average: 5.98 seconds

## 2022-10-05 NOTE — Addendum Note (Signed)
Addended by: Lars Mage on: 10/05/2022 01:49 PM   Modules accepted: Orders

## 2022-10-06 LAB — BASIC METABOLIC PANEL
BUN/Creatinine Ratio: 12 (ref 9–20)
BUN: 13 mg/dL (ref 6–24)
CO2: 21 mmol/L (ref 20–29)
Calcium: 9.8 mg/dL (ref 8.7–10.2)
Chloride: 104 mmol/L (ref 96–106)
Creatinine, Ser: 1.13 mg/dL (ref 0.76–1.27)
Glucose: 74 mg/dL (ref 70–99)
Potassium: 4.1 mmol/L (ref 3.5–5.2)
Sodium: 143 mmol/L (ref 134–144)
eGFR: 77 mL/min/{1.73_m2} (ref >59)

## 2022-10-06 LAB — SPECIMEN STATUS REPORT

## 2022-10-06 LAB — PRO B NATRIURETIC PEPTIDE: NT-Pro BNP: 189 pg/mL (ref 0–210)

## 2022-10-09 ENCOUNTER — Other Ambulatory Visit: Payer: Self-pay

## 2022-10-09 DIAGNOSIS — Q231 Congenital insufficiency of aortic valve: Secondary | ICD-10-CM

## 2022-10-09 DIAGNOSIS — I350 Nonrheumatic aortic (valve) stenosis: Secondary | ICD-10-CM

## 2022-10-24 ENCOUNTER — Encounter (HOSPITAL_COMMUNITY): Payer: Self-pay

## 2022-10-25 ENCOUNTER — Telehealth (HOSPITAL_COMMUNITY): Payer: Self-pay | Admitting: *Deleted

## 2022-10-25 NOTE — Telephone Encounter (Signed)
Reaching out to patient to offer assistance regarding upcoming cardiac imaging study; pt verbalizes understanding of appt date/time, parking situation and where to check in, pre-test NPO status and medications ordered, and verified current allergies; name and call back number provided for further questions should they arise Johney Frame RN Navigator Cardiac Imaging [facility] [phone] office [phone] cell

## 2022-10-26 ENCOUNTER — Ambulatory Visit (HOSPITAL_COMMUNITY)
Admission: RE | Admit: 2022-10-26 | Discharge: 2022-10-26 | Disposition: A | Discharge status: Home / Self Care | Payer: BC Managed Care – PPO | Source: Ambulatory Visit | Attending: Internal Medicine | Admitting: Internal Medicine

## 2022-10-26 DIAGNOSIS — Q231 Congenital insufficiency of aortic valve: Secondary | ICD-10-CM | POA: Insufficient documentation

## 2022-10-26 DIAGNOSIS — I350 Nonrheumatic aortic (valve) stenosis: Secondary | ICD-10-CM | POA: Insufficient documentation

## 2022-10-26 MED ORDER — NITROGLYCERIN 0.4 MG SL SUBL: 0.8000 mg | SUBLINGUAL_TABLET | Freq: Once | SUBLINGUAL | Status: DC

## 2022-10-26 MED ORDER — IOHEXOL 350 MG/ML SOLN
95.0000 mL | Freq: Once | INTRAVENOUS | Status: AC | PRN
  Administered 2022-10-26: 95 mL via INTRAVENOUS

## 2022-11-01 ENCOUNTER — Telehealth (HOSPITAL_COMMUNITY): Payer: Self-pay | Admitting: Surgery

## 2022-11-01 NOTE — Telephone Encounter (Signed)
I called the pt to remind him of his GXT stress test tomorrow.  The pt was told to arrive around 10 am and register at the front desk.  I also told him to not eat or drink 6 hours prior to the test (no caffeine or chocolate), to not smoke 8 hours prior to the test, and to wear comfortable clothes and tennis shoes.  The pt verbalized understanding of these instructions.

## 2022-11-02 ENCOUNTER — Ambulatory Visit (HOSPITAL_COMMUNITY)
Admission: RE | Admit: 2022-11-02 | Discharge: 2022-11-02 | Disposition: A | Discharge status: Home / Self Care | Payer: BC Managed Care – PPO | Source: Ambulatory Visit | Attending: Internal Medicine | Admitting: Internal Medicine

## 2022-11-02 DIAGNOSIS — I350 Nonrheumatic aortic (valve) stenosis: Secondary | ICD-10-CM | POA: Diagnosis present

## 2022-11-02 LAB — EXERCISE TOLERANCE TEST
Angina Index: 0
Base ST Depression (mm): 0 mm
Estimated workload: 5.7
Exercise duration (min): 4 min
Exercise duration (sec): 13 s
MPHR: 164 {beats}/min
Peak HR: 131 {beats}/min
Percent HR: 79 %
RPE: 13
Rest HR: 65 {beats}/min

## 2022-11-05 ENCOUNTER — Encounter: Payer: Self-pay | Admitting: Neurology

## 2022-11-05 ENCOUNTER — Ambulatory Visit (INDEPENDENT_AMBULATORY_CARE_PROVIDER_SITE_OTHER): Payer: BC Managed Care – PPO | Admitting: Neurology

## 2022-11-05 VITALS — BP 136/86 | HR 84 | Ht 70.5 in | Wt 228.2 lb

## 2022-11-05 DIAGNOSIS — Z9889 Other specified postprocedural states: Secondary | ICD-10-CM | POA: Diagnosis not present

## 2022-11-05 DIAGNOSIS — G40909 Epilepsy, unspecified, not intractable, without status epilepticus: Secondary | ICD-10-CM | POA: Diagnosis not present

## 2022-11-05 DIAGNOSIS — G40209 Localization-related (focal) (partial) symptomatic epilepsy and epileptic syndromes with complex partial seizures, not intractable, without status epilepticus: Secondary | ICD-10-CM | POA: Diagnosis not present

## 2022-11-05 MED ORDER — PHENYTOIN SODIUM EXTENDED 100 MG PO CAPS: 200.0000 mg | ORAL_CAPSULE | Freq: Every day | ORAL | 3 refills | Status: DC

## 2022-11-05 MED ORDER — DIVALPROEX SODIUM ER 500 MG PO TB24: ORAL_TABLET | ORAL | 3 refills | Status: DC

## 2022-11-05 NOTE — Progress Notes (Signed)
Chief Complaint  Patient presents with   Seizures    Rm14, alone, Sz: last 1 was 10 yrs ago. Had dilantin level checked at pcp and they stated that it was elevated 4 months ago he wanted Dr. Terrace Arabia to take a look at it      ASSESSMENT AND PLAN  Jerry [name] is a 57 y.o. male   History of brain abscess, required craniotomy, Complex partial seizure  Has been on current dose of Depakote ER 500 mg 3 tablets twice a day, Dilantin 100 mg 2 every night for many years,  He now lives alone, not on disability, does not want to go on disability, drive for living,  Will keep current dose of medications, recheck level,  Repeat EEG  Return To Clinic With NP In 9 Months   DIAGNOSTIC DATA (LABS, IMAGING, TESTING) - I reviewed patient records, labs, notes, testing and imaging myself where available.   MEDICAL HISTORY:  Jerry [name], is a 57 year old male, following up for partial seizure.  History is obtained from the patient and review of electronic medical records. I personally reviewed pertinent available imaging films in PACS.   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, delivering samples, he never married, has no children, does not want to apply for disability, hope to keep his independence  He was treated at urgent care on 06/25/2022 for acute onset of double vision, lasting for half day  For that reason he had CT angiogram of head and neck [facility], no significant large vessel  disease, status post left frontal craniotomy with encephalomalacia in the left left parietal and posterior frontal lobe paranasal sinus disease with complete OF occasional bilateral frontal sinus  Lab: UDS was negative, Dilantin level less 2.1, normal CBC, CMP  PHYSICAL EXAM:   Vitals:   11/05/22 1450 11/05/22 1456  BP: (!) 145/91 136/86  Pulse: 84   Weight: 228 lb 2.8 oz (103.5 kg)   Height: 5' 10.5" (1.791 m)    Body mass index is 32.28 kg/m.  PHYSICAL EXAMNIATION:  Gen: NAD, conversant, well nourised, well groomed                     Cardiovascular: Regular rate rhythm, no peripheral edema, warm, nontender. Eyes: Conjunctivae clear without exudates or hemorrhage Neck: Supple, no carotid bruits. Pulmonary: Clear to auscultation bilaterally  Scalp: Well-healed bilateral frontal craniotomy scar  NEUROLOGICAL EXAM:  MENTAL STATUS: Speech/cognition: Awake, alert, oriented to history taking and casual conversation, but slow reaction time, CRANIAL NERVES: CN II: Visual fields are full to confrontation. Pupils are round equal and briskly reactive to light. CN III, IV, VI: extraocular movement are normal. No ptosis. CN V: Facial sensation is intact to light touch CN VII: Face is symmetric with normal eye closure  CN VIII: Hearing is normal to causal conversation. CN IX, X: Phonation is normal. CN XI: Head turning and shoulder shrug are intact  MOTOR: There is no pronator drift  of out-stretched arms. Muscle bulk and tone are normal. Muscle strength is normal.  REFLEXES: Reflexes are 2+ and symmetric at the biceps, triceps, knees, and ankles. Plantar responses are flexor.  SENSORY: Intact to light touch, pinprick and vibratory sensation are intact in fingers and toes.  COORDINATION: There is no trunk or limb dysmetria noted.  GAIT/STANCE: Posture is normal. Gait is steady with normal steps, base, arm swing, and turning. Heel and toe walking are normal. Tandem gait is  normal.  Romberg is absent.  REVIEW OF SYSTEMS:  Full 14 system review of systems performed and notable only for as above All other review of systems were negative.   ALLERGIES: No Known Allergies  HOME MEDICATIONS: Current Outpatient Medications  Medication Sig Dispense Refill   amLODipine (NORVASC) 10 MG tablet Take 1 tablet (10 mg total) by mouth daily. 180 tablet 3   rosuvastatin (CRESTOR) 10 MG tablet SMARTSIG:1 Tablet(s) By Mouth Every Evening     divalproex (DEPAKOTE ER) 500 MG 24 hr tablet TAKE 3 TABLETS BY MOUTH IN THE MORNING AND 3 IN THE EVENING 540 tablet 3   phenytoin (DILANTIN) 100 MG ER capsule Take 2 capsules (200 mg total) by mouth at bedtime. 180 capsule 3   No current facility-administered medications for this visit.    PAST MEDICAL HISTORY: Past Medical History:  Diagnosis Date   Aortic stenosis    Head injury    Subdural hematoma after a traumatic brain injury in 1984   Mixed hyperlipidemia    Seizure (HCC)    Skin cancer    Varicose veins    Vasculitis (HCC) 11/2014    PAST SURGICAL HISTORY: Past Surgical History:  Procedure Laterality Date   Surgical plate in head     TOE INTERNAL FIXATION W/ COMPRESSION SCREW      FAMILY HISTORY: Family History  Problem Relation Age of Onset   Diabetes Father     SOCIAL HISTORY: Social History   Socioeconomic History   Marital status: Married    Spouse name: Not on file   Number of children: 0   Years of education: 12   Highest education level: Not on file  Occupational History    Comment: Not working  Tobacco Use   Smoking status: Former    Current packs/day: 0.00    Types: Cigarettes    Quit date: 10/21/2014    Years since quitting: 8.0   Smokeless tobacco: Never  Vaping Use   Vaping status: Never Used  Substance and Sexual Activity   Alcohol use: No    Alcohol/week: 0.0 standard drinks of alcohol   Drug use: No   Sexual activity: Not on file  Other Topics Concern   Not on file   Social History Narrative   Patient is currently not working,High school education some college. Patient  Is single and lives with his mother.    Right handed.   Caffeine one cup daily.   Social Determinants of Health   Financial Resource Strain: Not on file  Food Insecurity: Not on file  Transportation Needs: Not on file  Physical Activity: Not on file  Stress: Not on file  Social Connections: Not on file  Intimate Partner Violence: Not on file      Levert Feinstein, M.D. Ph.D.  [facility] [address] [place], [place] [address] Ph: [phone] Fax: [phone]  CC:  Mechele Claude, MD [address] [place],  [place] [address]  Donetta Potts, MD

## 2022-11-06 LAB — TSH: TSH: 2.17 u[IU]/mL (ref 0.450–4.500)

## 2022-11-06 LAB — VALPROIC ACID LEVEL: Valproic Acid Lvl: 112 ug/mL — ABNORMAL HIGH (ref 50–100)

## 2022-11-06 LAB — PHENYTOIN LEVEL, TOTAL: Phenytoin (Dilantin), Serum: 1.5 ug/mL — ABNORMAL LOW (ref 10.0–20.0)

## 2022-11-19 ENCOUNTER — Telehealth: Payer: Self-pay

## 2022-11-19 NOTE — Telephone Encounter (Signed)
Patient informed and verbalized understanding of plan. 

## 2022-11-19 NOTE — Telephone Encounter (Signed)
-----   Message from Vishnu P Mallipeddi sent at 11/04/2022  2:37 PM EDT ----- Stress EKG positive for ischemia but CT cardiac showed mild nonobstructive CAD.  He did have SOB during the test and the test had to be stopped due to dyspnea. He did not reach 85% MAPHR. I am copying Dr. Excell Seltzer to see if he wants to see him sooner.

## 2022-11-26 ENCOUNTER — Ambulatory Visit: Payer: BC Managed Care – PPO | Admitting: Cardiovascular Disease

## 2022-12-05 ENCOUNTER — Ambulatory Visit (INDEPENDENT_AMBULATORY_CARE_PROVIDER_SITE_OTHER): Payer: BC Managed Care – PPO | Admitting: Neurology

## 2022-12-05 ENCOUNTER — Telehealth: Payer: Self-pay | Admitting: Neurology

## 2022-12-05 DIAGNOSIS — G40909 Epilepsy, unspecified, not intractable, without status epilepticus: Secondary | ICD-10-CM

## 2022-12-05 NOTE — Telephone Encounter (Signed)
Pt would like a phone call for his EEG results

## 2022-12-05 NOTE — Telephone Encounter (Signed)
Phone rm: Let pt know once the providers receive results and they make recommendations... then we will contact them.  Thanks,  Production assistant, radio

## 2022-12-06 NOTE — H&P (View-Only) (Signed)
Cardiology Office Note    Patient Name: Jerry [name] Date of Encounter: 12/07/2022  Primary Care Provider:  Donetta Potts, MD Primary Cardiologist:  Jerry Bicker, MD Primary Electrophysiologist: None   Past Medical History    Past Medical History:  Diagnosis Date   Aortic stenosis    Head injury    Subdural hematoma after a traumatic brain injury in 1984   Mixed hyperlipidemia    Seizure (HCC)    Skin cancer    Varicose veins    Vasculitis (HCC) 11/2014    History of Present Illness  Jerry [name] is a 57 y.o. male with a PMH of bicuspid aortic stenosis, HLD, subdural hematoma s/p TBI in 1994, seizure disorder who presents today for evaluation prior right and left heart catheterization.  Jerry [name] was seen by Jerry [name] for evaluation of aortic stenosis on 07/06/2022.  2D echo was completed 06/2021 showing normal EF with severe aortic stenosis and mean gradient of 38.2 mmHg with upgraded 2D echo showing EF of 60 to 65% with mild LVH and grade 1 DD with severe aortic stenosis and mean gradient of 32 mmHg.   He was referred to Jerry [name] and was seen on 10/05/2022 was recommended to undergo further risk stratification. He also completed ETT positive EKG for ischemia cardiac CTA showing mild obstructive disease.  He will undergo right/LHC for further evaluation.  During today's visit the patient reports that he has not experienced any chest pain since his previous follow-up.  His blood pressure today is elevated at 152/94 and was 160/90 on recheck.  He reports compliance with his current medication regimen and denies any adverse reactions.  During today's visit we discussed the reasoning and process of undergoing right and left heart catheterization.  He had all questions answered to his satisfaction.  He was also provided information on advanced directives and was interested in learning more about obtaining an advanced directive in the future.  He denied any shortness  of breath or chest discomfort during today's visit..  Patient denies chest pain, palpitations, dyspnea, PND, orthopnea, nausea, vomiting, dizziness, syncope, edema, weight gain, or early satiety.  Review of Systems  Please see the history of present illness.    All other systems reviewed and are otherwise negative except as noted above.  Physical Exam    Wt Readings from Last 3 Encounters:  12/07/22 232 lb (105.2 kg)  11/05/22 228 lb 2.8 oz (103.5 kg)  10/05/22 228 lb 3.2 oz (103.5 kg)   VS: Vitals:   12/07/22 1408 12/07/22 1413  BP: (!) 152/94 (!) 160/90  Pulse: 65   SpO2: 96%   ,Body mass index is 32.82 kg/m. GEN: Well nourished, well developed in no acute distress Neck: No JVD; No carotid bruits Pulmonary: Clear to auscultation without rales, wheezing or rhonchi  Cardiovascular: Normal rate. Regular rhythm. Normal S1. Normal S2.   Murmurs: 2/6 systolic murmur ABDOMEN: Soft, non-tender, non-distended EXTREMITIES:  No edema; No deformity   EKG/LABS/ Recent Cardiac Studies   ECG personally reviewed by me today -sinus rhythm with rate of 65 TWI in leads I, 2, aVL, V2, V3 V4, V5 and V6 consistent with previous EKG.  Risk Assessment/Calculations:          Lab Results  Component Value Date   WBC 8.8 08/09/2021   HGB 15.5 08/09/2021   HCT 44.2 08/09/2021   MCV 94 08/09/2021   PLT 213 08/09/2021   Lab Results  Component Value Date  CREATININE 1.13 10/05/2022   BUN 13 10/05/2022   NA 143 10/05/2022   K 4.1 10/05/2022   CL 104 10/05/2022   CO2 21 10/05/2022   Lab Results  Component Value Date   CHOL 254 (H) 12/07/2019   HDL 36 (L) 12/07/2019   LDLCALC 172 (H) 12/07/2019   TRIG 245 (H) 12/07/2019   CHOLHDL 7.1 (H) 12/07/2019    No results found for: "HGBA1C" Assessment & Plan    1.  Bicuspid aortic stenosis: -Patient's last 2D echo was completed on 08/2022 showing severe aortic stenosis due to bicuspid aortic valve. -Today patient reports no dizziness, or  chest pain. -Patient will undergo R/LHC per Jerry [name] -We will check BMET and CBC today  2.  Hyperlipidemia: -His last LDL cholesterol was 142 -Patient is currently on Crestor 10 mg  3.  History of TBI: -Patient underwent craniotomy and access in 1980s -Currently followed by neurology  4.  Essential hypertension: -HYPERTENSION CONTROL Vitals:   12/07/22 1408 12/07/22 1413  BP: (!) 152/94 (!) 160/90    The patient's blood pressure is elevated above target today.  In order to address the patient's elevated BP: Blood pressure will be monitored at home to determine if medication changes need to be made.     -Patient will monitor blood pressures over the next 2 weeks and contact office with findings. -Continue Norvasc 10 mg daily -If patient's BP continues to be elevated we will add additional therapy to current medication regimen      Disposition: Follow-up with Jerry P Mallipeddi, MD as scheduled Informed Consent   Shared Decision Making/Informed Consent The risks [stroke (1 in 1000), death (1 in 1000), kidney failure [usually temporary] (1 in 500), bleeding (1 in 200), allergic reaction [possibly serious] (1 in 200)], benefits (diagnostic support and management of coronary artery disease) and alternatives of a cardiac catheterization were discussed in detail with [name] and he is willing to proceed.      Signed, Jerry [name], Jerry Rains, NP 12/07/2022, 6:14 PM [facility]

## 2022-12-06 NOTE — Progress Notes (Signed)
Cardiology Office Note    Patient Name: Jerry [name] Date of Encounter: 12/07/2022  Primary Care Provider:  Donetta Potts, MD Primary Cardiologist:  Marjo Bicker, MD Primary Electrophysiologist: None   Past Medical History    Past Medical History:  Diagnosis Date   Aortic stenosis    Head injury    Subdural hematoma after a traumatic brain injury in 1984   Mixed hyperlipidemia    Seizure (HCC)    Skin cancer    Varicose veins    Vasculitis (HCC) 11/2014    History of Present Illness  Jerry [name] is a 57 y.o. male with a PMH of bicuspid aortic stenosis, HLD, subdural hematoma s/p TBI in 1994, seizure disorder who presents today for evaluation prior right and left heart catheterization.  [name] was seen by Dr. Jenene Slicker for evaluation of aortic stenosis on 07/06/2022.  2D echo was completed 06/2021 showing normal EF with severe aortic stenosis and mean gradient of 38.2 mmHg with upgraded 2D echo showing EF of 60 to 65% with mild LVH and grade 1 DD with severe aortic stenosis and mean gradient of 32 mmHg.   He was referred to Dr. Excell Seltzer and was seen on 10/05/2022 was recommended to undergo further risk stratification. He also completed ETT positive EKG for ischemia cardiac CTA showing mild obstructive disease.  He will undergo right/LHC for further evaluation.  During today's visit the patient reports that he has not experienced any chest pain since his previous follow-up.  His blood pressure today is elevated at 152/94 and was 160/90 on recheck.  He reports compliance with his current medication regimen and denies any adverse reactions.  During today's visit we discussed the reasoning and process of undergoing right and left heart catheterization.  He had all questions answered to his satisfaction.  He was also provided information on advanced directives and was interested in learning more about obtaining an advanced directive in the future.  He denied any shortness  of breath or chest discomfort during today's visit..  Patient denies chest pain, palpitations, dyspnea, PND, orthopnea, nausea, vomiting, dizziness, syncope, edema, weight gain, or early satiety.  Review of Systems  Please see the history of present illness.    All other systems reviewed and are otherwise negative except as noted above.  Physical Exam    Wt Readings from Last 3 Encounters:  12/07/22 232 lb (105.2 kg)  11/05/22 228 lb 2.8 oz (103.5 kg)  10/05/22 228 lb 3.2 oz (103.5 kg)   VS: Vitals:   12/07/22 1408 12/07/22 1413  BP: (!) 152/94 (!) 160/90  Pulse: 65   SpO2: 96%   ,Body mass index is 32.82 kg/m. GEN: Well nourished, well developed in no acute distress Neck: No JVD; No carotid bruits Pulmonary: Clear to auscultation without rales, wheezing or rhonchi  Cardiovascular: Normal rate. Regular rhythm. Normal S1. Normal S2.   Murmurs: 2/6 systolic murmur ABDOMEN: Soft, non-tender, non-distended EXTREMITIES:  No edema; No deformity   EKG/LABS/ Recent Cardiac Studies   ECG personally reviewed by me today -sinus rhythm with rate of 65 TWI in leads I, 2, aVL, V2, V3 V4, V5 and V6 consistent with previous EKG.  Risk Assessment/Calculations:          Lab Results  Component Value Date   WBC 8.8 08/09/2021   HGB 15.5 08/09/2021   HCT 44.2 08/09/2021   MCV 94 08/09/2021   PLT 213 08/09/2021   Lab Results  Component Value Date  CREATININE 1.13 10/05/2022   BUN 13 10/05/2022   NA 143 10/05/2022   K 4.1 10/05/2022   CL 104 10/05/2022   CO2 21 10/05/2022   Lab Results  Component Value Date   CHOL 254 (H) 12/07/2019   HDL 36 (L) 12/07/2019   LDLCALC 172 (H) 12/07/2019   TRIG 245 (H) 12/07/2019   CHOLHDL 7.1 (H) 12/07/2019    No results found for: "HGBA1C" Assessment & Plan    1.  Bicuspid aortic stenosis: -Patient's last 2D echo was completed on 08/2022 showing severe aortic stenosis due to bicuspid aortic valve. -Today patient reports no dizziness, or  chest pain. -Patient will undergo R/LHC per Dr. Excell Seltzer -We will check BMET and CBC today  2.  Hyperlipidemia: -His last LDL cholesterol was 142 -Patient is currently on Crestor 10 mg  3.  History of TBI: -Patient underwent craniotomy and access in 1980s -Currently followed by neurology  4.  Essential hypertension: -HYPERTENSION CONTROL Vitals:   12/07/22 1408 12/07/22 1413  BP: (!) 152/94 (!) 160/90    The patient's blood pressure is elevated above target today.  In order to address the patient's elevated BP: Blood pressure will be monitored at home to determine if medication changes need to be made.     -Patient will monitor blood pressures over the next 2 weeks and contact office with findings. -Continue Norvasc 10 mg daily -If patient's BP continues to be elevated we will add additional therapy to current medication regimen      Disposition: Follow-up with Vishnu P Mallipeddi, MD as scheduled Informed Consent   Shared Decision Making/Informed Consent The risks [stroke (1 in 1000), death (1 in 1000), kidney failure [usually temporary] (1 in 500), bleeding (1 in 200), allergic reaction [possibly serious] (1 in 200)], benefits (diagnostic support and management of coronary artery disease) and alternatives of a cardiac catheterization were discussed in detail with [name] and he is willing to proceed.      Signed, Napoleon Form, Leodis Rains, NP 12/07/2022, 6:14 PM [facility]

## 2022-12-07 ENCOUNTER — Encounter: Payer: Self-pay | Admitting: Nurse Practitioner

## 2022-12-07 ENCOUNTER — Ambulatory Visit: Payer: BC Managed Care – PPO | Attending: Cardiovascular Disease | Admitting: Nurse Practitioner

## 2022-12-07 VITALS — BP 160/90 | HR 65 | Ht 70.5 in | Wt 232.0 lb

## 2022-12-07 DIAGNOSIS — Q2381 Bicuspid aortic valve: Secondary | ICD-10-CM

## 2022-12-07 DIAGNOSIS — I10 Essential (primary) hypertension: Secondary | ICD-10-CM

## 2022-12-07 DIAGNOSIS — I350 Nonrheumatic aortic (valve) stenosis: Secondary | ICD-10-CM | POA: Diagnosis not present

## 2022-12-07 DIAGNOSIS — Z9889 Other specified postprocedural states: Secondary | ICD-10-CM

## 2022-12-07 DIAGNOSIS — E785 Hyperlipidemia, unspecified: Secondary | ICD-10-CM

## 2022-12-07 NOTE — Addendum Note (Signed)
Addended by: Elizabeth Palau on: 12/07/2022 06:18 PM   Modules accepted: Orders

## 2022-12-07 NOTE — Patient Instructions (Signed)
Medication Instructions:  Your physician recommends that you continue on your current medications as directed. Please refer to the Current Medication list given to you today. *If you need a refill on your cardiac medications before your next appointment, please call your pharmacy*   Lab Work: TODAY-BMET & CBC If you have labs (blood work) drawn today and your tests are completely normal, you will receive your results only by: MyChart Message (if you have MyChart) OR A paper copy in the mail If you have any lab test that is abnormal or we need to change your treatment, we will call you to review the results.   Testing/Procedures: Your physician has requested that you have a cardiac catheterization. Cardiac catheterization is used to diagnose and/or treat various heart conditions. Doctors may recommend this procedure for a number of different reasons. The most common reason is to evaluate chest pain. Chest pain can be a symptom of coronary artery disease (CAD), and cardiac catheterization can show whether plaque is narrowing or blocking your heart's arteries. This procedure is also used to evaluate the valves, as well as measure the blood flow and oxygen levels in different parts of your heart. For further information please visit [email or web address] Please follow instruction sheet, as given.   Follow-Up: At [facility], you and your health needs are our priority.  As part of our continuing mission to provide you with exceptional heart care, we have created designated Provider Care Teams.  These Care Teams include your primary Cardiologist (physician) and Advanced Practice Providers (APPs -  Physician Assistants and Nurse Practitioners) who all work together to provide you with the care you need, when you need it.  We recommend signing up for the patient portal called "MyChart".  Sign up information is provided on this After Visit Summary.  MyChart is used to connect with patients for  Virtual Visits (Telemedicine).  Patients are able to view lab/test results, encounter notes, upcoming appointments, etc.  Non-urgent messages can be sent to your provider as well.   To learn more about what you can do with MyChart, go to [email or web address].    Your next appointment:   1 month(s) AS SCHEDULED  Provider:   Luane School, MD    Other Instructions         Cardiac Catheterization   You are scheduled for a Cardiac Catheterization on Monday, November 4 with Dr. Tonny Bollman.  1. Please arrive at the [facility] (Main Entrance A) at [facility]: [address] [place], [place] [address] at 5:30 AM (This time is 2 hour(s) before your procedure to ensure your preparation). Free valet parking service is available. You will check in at ADMITTING. The support person will be asked to wait in the waiting room.  It is OK to have someone drop you off and come back when you are ready to be discharged.        Special note: Every effort is made to have your procedure done on time. Please understand that emergencies sometimes delay scheduled procedures.  2. Diet: Do not eat solid foods after midnight.  You may have clear liquids until 5 AM the day of the procedure.  3. Labs: You will need to have blood drawn on Friday, November 1 at [facility] at [facility]. [address], [place]  Open: 7:30am - 5pm    Phone: [phone]. You do not need to be fasting.  4. Medication instructions in preparation for your procedure:  Contrast Allergy: No  On the morning of your procedure, take Aspirin 81 mg and any morning medicines NOT listed above.  You may use sips of water.  5. Plan to go home the same day, you will only stay overnight if medically necessary. 6. You MUST have a responsible adult to drive you home. 7. An adult MUST be with you the first 24 hours after you arrive home. 8. Bring a current list of your medications, and the last time  and date medication taken. 9. Bring ID and current insurance cards. 10.Please wear clothes that are easy to get on and off and wear slip-on shoes.  Thank you for allowing [place] to care for you!   -- [facility]

## 2022-12-08 LAB — BASIC METABOLIC PANEL
BUN/Creatinine Ratio: 10 (ref 9–20)
BUN: 11 mg/dL (ref 6–24)
CO2: 22 mmol/L (ref 20–29)
Calcium: 9.6 mg/dL (ref 8.7–10.2)
Chloride: 101 mmol/L (ref 96–106)
Creatinine, Ser: 1.12 mg/dL (ref 0.76–1.27)
Glucose: 75 mg/dL (ref 70–99)
Potassium: 3.5 mmol/L (ref 3.5–5.2)
Sodium: 142 mmol/L (ref 134–144)
eGFR: 77 mL/min/{1.73_m2} (ref >59)

## 2022-12-08 LAB — CBC
Hematocrit: 43.3 % (ref 37.5–51.0)
Hemoglobin: 15.2 g/dL (ref 13.0–17.7)
MCH: 33.7 pg — ABNORMAL HIGH (ref 26.6–33.0)
MCHC: 35.1 g/dL (ref 31.5–35.7)
MCV: 96 fL (ref 79–97)
Platelets: 194 10*3/uL (ref 150–450)
RBC: 4.51 x10E6/uL (ref 4.14–5.80)
RDW: 13.4 % (ref 11.6–15.4)
WBC: 9.7 10*3/uL (ref 3.4–10.8)

## 2022-12-10 ENCOUNTER — Other Ambulatory Visit: Payer: Self-pay

## 2022-12-10 ENCOUNTER — Encounter (HOSPITAL_COMMUNITY): Payer: Self-pay | Admitting: Cardiovascular Disease

## 2022-12-10 ENCOUNTER — Encounter (HOSPITAL_COMMUNITY)
Admission: RE | Disposition: A | Discharge status: Home / Self Care | Payer: Self-pay | Source: Home / Self Care | Attending: Cardiovascular Disease

## 2022-12-10 ENCOUNTER — Ambulatory Visit (HOSPITAL_COMMUNITY)
Admission: RE | Admit: 2022-12-10 | Discharge: 2022-12-11 | Disposition: A | Discharge status: Home / Self Care | Payer: BC Managed Care – PPO | Attending: Cardiovascular Disease | Admitting: Cardiovascular Disease

## 2022-12-10 DIAGNOSIS — Z87820 Personal history of traumatic brain injury: Secondary | ICD-10-CM | POA: Insufficient documentation

## 2022-12-10 DIAGNOSIS — Z79899 Other long term (current) drug therapy: Secondary | ICD-10-CM | POA: Diagnosis not present

## 2022-12-10 DIAGNOSIS — I2510 Atherosclerotic heart disease of native coronary artery without angina pectoris: Secondary | ICD-10-CM | POA: Insufficient documentation

## 2022-12-10 DIAGNOSIS — I10 Essential (primary) hypertension: Secondary | ICD-10-CM | POA: Insufficient documentation

## 2022-12-10 DIAGNOSIS — G40909 Epilepsy, unspecified, not intractable, without status epilepticus: Secondary | ICD-10-CM | POA: Diagnosis not present

## 2022-12-10 DIAGNOSIS — I352 Nonrheumatic aortic (valve) stenosis with insufficiency: Secondary | ICD-10-CM | POA: Insufficient documentation

## 2022-12-10 DIAGNOSIS — E782 Mixed hyperlipidemia: Secondary | ICD-10-CM | POA: Insufficient documentation

## 2022-12-10 DIAGNOSIS — I350 Nonrheumatic aortic (valve) stenosis: Secondary | ICD-10-CM | POA: Diagnosis present

## 2022-12-10 HISTORY — PX: RIGHT/LEFT HEART CATH AND CORONARY ANGIOGRAPHY: CATH118266

## 2022-12-10 LAB — POCT I-STAT 7, (LYTES, BLD GAS, ICA,H+H)
Acid-base deficit: 2 mmol/L (ref 0.0–2.0)
Bicarbonate: 22.8 mmol/L (ref 20.0–28.0)
Calcium, Ion: 1.26 mmol/L (ref 1.15–1.40)
HCT: 40 % (ref 39.0–52.0)
Hemoglobin: 13.6 g/dL (ref 13.0–17.0)
O2 Saturation: 95 %
Potassium: 3.9 mmol/L (ref 3.5–5.1)
Sodium: 140 mmol/L (ref 135–145)
TCO2: 24 mmol/L (ref 22–32)
pCO2 arterial: 39.5 mm[Hg] (ref 32–48)
pH, Arterial: 7.37 (ref 7.35–7.45)
pO2, Arterial: 80 mm[Hg] — ABNORMAL LOW (ref 83–108)

## 2022-12-10 LAB — POCT I-STAT EG7
Acid-base deficit: 4 mmol/L — ABNORMAL HIGH (ref 0.0–2.0)
Bicarbonate: 22.4 mmol/L (ref 20.0–28.0)
Calcium, Ion: 1.21 mmol/L (ref 1.15–1.40)
HCT: 39 % (ref 39.0–52.0)
Hemoglobin: 13.3 g/dL (ref 13.0–17.0)
O2 Saturation: 64 %
Potassium: 3.6 mmol/L (ref 3.5–5.1)
Sodium: 136 mmol/L (ref 135–145)
TCO2: 24 mmol/L (ref 22–32)
pCO2, Ven: 44.8 mm[Hg] (ref 44–60)
pH, Ven: 7.306 (ref 7.25–7.43)
pO2, Ven: 37 mm[Hg] (ref 32–45)

## 2022-12-10 LAB — GLUCOSE, CAPILLARY: Glucose-Capillary: 94 mg/dL (ref 70–99)

## 2022-12-10 SURGERY — RIGHT/LEFT HEART CATH AND CORONARY ANGIOGRAPHY
Anesthesia: LOCAL

## 2022-12-10 MED ORDER — IOHEXOL 350 MG/ML SOLN
INTRAVENOUS | Status: DC | PRN
  Administered 2022-12-10: 53 mL

## 2022-12-10 MED ORDER — ASPIRIN 81 MG PO CHEW: 81.0000 mg | CHEWABLE_TABLET | ORAL | Status: DC

## 2022-12-10 MED ORDER — FENTANYL CITRATE (PF) 100 MCG/2ML IJ SOLN
INTRAMUSCULAR | Status: AC
  Filled 2022-12-10: qty 2

## 2022-12-10 MED ORDER — LIDOCAINE HCL (PF) 1 % IJ SOLN
INTRAMUSCULAR | Status: AC
  Filled 2022-12-10: qty 30

## 2022-12-10 MED ORDER — MIDAZOLAM HCL 2 MG/2ML IJ SOLN
INTRAMUSCULAR | Status: AC
  Filled 2022-12-10: qty 2

## 2022-12-10 MED ORDER — SODIUM CHLORIDE 0.9 % IV SOLN: 250.0000 mL | INTRAVENOUS | Status: DC | PRN

## 2022-12-10 MED ORDER — SODIUM CHLORIDE 0.9% FLUSH
3.0000 mL | Freq: Two times a day (BID) | INTRAVENOUS | Status: DC
  Administered 2022-12-10: 3 mL via INTRAVENOUS

## 2022-12-10 MED ORDER — HEPARIN (PORCINE) IN NACL 1000-0.9 UT/500ML-% IV SOLN
INTRAVENOUS | Status: DC | PRN
  Administered 2022-12-10 (×2): 500 mL

## 2022-12-10 MED ORDER — ONDANSETRON HCL 4 MG/2ML IJ SOLN: 4.0000 mg | Freq: Four times a day (QID) | INTRAMUSCULAR | Status: DC | PRN

## 2022-12-10 MED ORDER — SODIUM CHLORIDE 0.9 % IV SOLN: INTRAVENOUS | Status: AC

## 2022-12-10 MED ORDER — ROSUVASTATIN CALCIUM 5 MG PO TABS
10.0000 mg | ORAL_TABLET | Freq: Every evening | ORAL | Status: DC
  Administered 2022-12-10: 10 mg via ORAL
  Filled 2022-12-10: qty 2

## 2022-12-10 MED ORDER — LIDOCAINE HCL (PF) 1 % IJ SOLN
INTRAMUSCULAR | Status: DC | PRN
  Administered 2022-12-10 (×2): 2 mL

## 2022-12-10 MED ORDER — DIVALPROEX SODIUM ER 500 MG PO TB24
1500.0000 mg | ORAL_TABLET | Freq: Two times a day (BID) | ORAL | Status: DC
  Administered 2022-12-10 – 2022-12-11 (×3): 1500 mg via ORAL
  Filled 2022-12-10 (×3): qty 3

## 2022-12-10 MED ORDER — AMLODIPINE BESYLATE 5 MG PO TABS
10.0000 mg | ORAL_TABLET | Freq: Every day | ORAL | Status: DC
  Administered 2022-12-10 – 2022-12-11 (×2): 10 mg via ORAL
  Filled 2022-12-10: qty 4
  Filled 2022-12-10: qty 2

## 2022-12-10 MED ORDER — SODIUM CHLORIDE 0.9 % WEIGHT BASED INFUSION
3.0000 mL/kg/h | INTRAVENOUS | Status: DC
  Administered 2022-12-10: 3 mL/kg/h via INTRAVENOUS

## 2022-12-10 MED ORDER — SODIUM CHLORIDE 0.9% FLUSH
3.0000 mL | INTRAVENOUS | Status: DC | PRN
Start: 2022-12-10 — End: 2022-12-11

## 2022-12-10 MED ORDER — SODIUM CHLORIDE 0.9 % WEIGHT BASED INFUSION: 1.0000 mL/kg/h | INTRAVENOUS | Status: DC

## 2022-12-10 MED ORDER — ACETAMINOPHEN 325 MG PO TABS
650.0000 mg | ORAL_TABLET | ORAL | Status: DC | PRN
  Administered 2022-12-11: 650 mg via ORAL
  Filled 2022-12-10: qty 2

## 2022-12-10 MED ORDER — LABETALOL HCL 5 MG/ML IV SOLN: 10.0000 mg | INTRAVENOUS | Status: AC | PRN

## 2022-12-10 MED ORDER — PHENYTOIN SODIUM EXTENDED 100 MG PO CAPS
200.0000 mg | ORAL_CAPSULE | Freq: Every day | ORAL | Status: DC
  Administered 2022-12-10: 200 mg via ORAL
  Filled 2022-12-10: qty 2

## 2022-12-10 MED ORDER — HYDRALAZINE HCL 20 MG/ML IJ SOLN: 10.0000 mg | INTRAMUSCULAR | Status: AC | PRN

## 2022-12-10 MED ORDER — VERAPAMIL HCL 2.5 MG/ML IV SOLN
INTRAVENOUS | Status: DC | PRN
  Administered 2022-12-10: 10 mL via INTRA_ARTERIAL

## 2022-12-10 MED ORDER — VERAPAMIL HCL 2.5 MG/ML IV SOLN
INTRAVENOUS | Status: AC
  Filled 2022-12-10: qty 2

## 2022-12-10 MED ORDER — HEPARIN SODIUM (PORCINE) 1000 UNIT/ML IJ SOLN
INTRAMUSCULAR | Status: AC
  Filled 2022-12-10: qty 10

## 2022-12-10 MED ORDER — HEPARIN SODIUM (PORCINE) 1000 UNIT/ML IJ SOLN
INTRAMUSCULAR | Status: DC | PRN
  Administered 2022-12-10: 5000 [IU] via INTRAVENOUS

## 2022-12-10 MED ORDER — MIDAZOLAM HCL 2 MG/2ML IJ SOLN
INTRAMUSCULAR | Status: DC | PRN
  Administered 2022-12-10: 2 mg via INTRAVENOUS

## 2022-12-10 MED ORDER — FENTANYL CITRATE (PF) 100 MCG/2ML IJ SOLN
INTRAMUSCULAR | Status: DC | PRN
  Administered 2022-12-10: 25 ug via INTRAVENOUS

## 2022-12-10 SURGICAL SUPPLY — 14 items
CATH 5FR JL3.5 JR4 ANG PIG MP (CATHETERS) IMPLANT
CATH BALLN WEDGE 5F 110CM (CATHETERS) IMPLANT
CATH LAUNCHER 5F NOTO (CATHETERS) IMPLANT
CATHETER LAUNCHER 5F NOTO (CATHETERS) ×1
DEVICE RAD COMP TR BAND LRG (VASCULAR PRODUCTS) IMPLANT
GLIDESHEATH SLEND SS 6F .021 (SHEATH) IMPLANT
GUIDEWIRE INQWIRE 1.5J.035X260 (WIRE) IMPLANT
INQWIRE 1.5J .035X260CM (WIRE) ×1
KIT SYRINGE INJ CVI SPIKEX1 (MISCELLANEOUS) IMPLANT
MAT PREVALON FULL STRYKER (MISCELLANEOUS) IMPLANT
PACK CARDIAC CATHETERIZATION (CUSTOM PROCEDURE TRAY) ×1 IMPLANT
SET ATX-X65L (MISCELLANEOUS) IMPLANT
SHEATH GLIDE SLENDER 4/5FR (SHEATH) IMPLANT
SHEATH PROBE COVER 6X72 (BAG) IMPLANT

## 2022-12-10 NOTE — Consult Note (Signed)
[address]       [place] [address]             [phone]      Cardiothoracic Surgery Consultation  Reason for Consult: Severe bicuspid aortic stenosis Referring Physician: Dr. Tonny Bollman  Jerry [name] is an 57 y.o. male.  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  He has a history of bicuspid aortic stenosis that has been followed with echocardiogram.  A 2D echocardiogram in May 2022 showed a mean gradient of 38.2 mmHg and a peak of 69 mmHg with a valve area of 0.88 cm consistent with severe aortic stenosis.  He was asymptomatic and continued follow-up was recommended.  A follow-up echocardiogram on 08/24/2022 showed a mean gradient of 32 mmHg with a V-max of 4.02 m/s and a dimensionless index of 0.23.  Left ventricular ejection fraction is 60 to 65% with mild LVH.  There is grade 1 diastolic dysfunction.  A gated cardiac CTA on 10/26/2022 showed a bicuspid aortic valve with fusion of the right and noncoronary cusps.  There was severe leaflet calcification with restricted leaflet motion.  Aortic valve calcium score was 3330.  There was felt to be minimal coronary disease in the LAD and RCA.  He underwent an exercise treadmill test on 11/02/2022 which showed evidence of ischemia during stress.  The test was stopped due to shortness of breath and leg discomfort.  Cardiac catheterization was recommended and that was done today showing mild nonobstructive coronary disease.  Right heart pressures were within normal limits.  The patient is single and has no siblings.  His parents are deceased.  There is no family history of aortic valve disease, aneurysm, or aortic dissection.   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  He does not exercise at all.  He denies any exertional shortness of breath or fatigue.  He denies any chest discomfort.  He has had no dizziness or syncope.  He denies peripheral edema and orthopnea.  Past Medical History:  Diagnosis Date   Aortic stenosis    Head injury    Subdural hematoma after a traumatic brain injury in 1984   Mixed hyperlipidemia    Seizure (HCC)    Skin cancer    Varicose veins    Vasculitis (HCC) 11/2014    Past Surgical History:  Procedure Laterality Date   RIGHT/LEFT HEART CATH AND CORONARY ANGIOGRAPHY N/A 12/10/2022   Procedure: RIGHT/LEFT HEART CATH AND CORONARY ANGIOGRAPHY;  Surgeon: Tonny Bollman, MD;  Location: [facility] INVASIVE CV LAB;  Service: Cardiovascular;  Laterality: N/A;   Surgical plate in head     TOE INTERNAL FIXATION W/ COMPRESSION SCREW      Family History  Problem Relation Age of Onset   Diabetes Father     Social History:  reports that he quit smoking about 8 years ago. His smoking use included cigarettes. He has never used smokeless tobacco. He reports that he does not drink alcohol and does not use drugs.  Allergies: No Known Allergies  Medications: I have reviewed the patient's current medications. Prior to Admission:  Medications Prior to Admission  Medication Sig Dispense Refill Last Dose   amLODipine (NORVASC) 10 MG tablet Take 1 tablet (10 mg total) by mouth daily. 180 tablet 3 12/09/2022   Cholecalciferol (VITAMIN D-3 PO) Take 1 tablet by mouth in the morning.   12/09/2022   divalproex (DEPAKOTE ER) 500 MG 24 hr tablet TAKE 3 TABLETS BY MOUTH IN THE MORNING AND 3 IN THE EVENING 540 tablet 3 12/09/2022   phenytoin (DILANTIN) 100 MG ER capsule Take 2 capsules (200 mg total) by mouth at bedtime. 180 capsule 3 12/09/2022   rosuvastatin (CRESTOR) 10 MG tablet Take 10 mg by mouth every  evening.   12/09/2022   Scheduled:  amLODipine  10 mg Oral Daily   divalproex  1,500 mg Oral BID   phenytoin  200 mg Oral QHS   rosuvastatin  10 mg Oral QPM   sodium chloride flush  3 mL Intravenous Q12H   Continuous:  sodium chloride     ZOX:WRUEAV chloride, acetaminophen, ondansetron (ZOFRAN) IV, sodium chloride flush Anti-infectives (From admission, onward)    None       Results for orders placed or performed during the hospital encounter of 12/10/22 (from the past 48 hour(s))  I-STAT 7, (LYTES, BLD GAS, ICA, H+H)     Status: Abnormal   Collection Time: 12/10/22  9:08 AM  Result Value Ref Range   pH, Arterial 7.370 7.35 - 7.45   pCO2 arterial 39.5 32 - 48 mmHg   pO2, Arterial 80 (L) 83 - 108 mmHg   Bicarbonate 22.8 20.0 - 28.0 mmol/L   TCO2 24 22 - 32 mmol/L   O2 Saturation 95 %   Acid-base deficit 2.0 0.0 - 2.0 mmol/L   Sodium 140 135 - 145 mmol/L   Potassium 3.9 3.5 - 5.1 mmol/L   Calcium, Ion 1.26 1.15 - 1.40 mmol/L   HCT 40.0 39.0 - 52.0 %   Hemoglobin 13.6 13.0 - 17.0 g/dL   Sample type ARTERIAL   POCT I-Stat EG7     Status: Abnormal   Collection Time: 12/10/22  9:08 AM  Result Value Ref Range   pH, Ven 7.306 7.25 - 7.43   pCO2, Ven 44.8 44 - 60 mmHg   pO2, Ven 37 32 - 45 mmHg   Bicarbonate 22.4 20.0 - 28.0 mmol/L   TCO2 24 22 - 32 mmol/L   O2 Saturation 64 %   Acid-base deficit 4.0 (H) 0.0 - 2.0 mmol/L   Sodium 136 135 - 145 mmol/L   Potassium 3.6 3.5 - 5.1 mmol/L   Calcium, Ion 1.21 1.15 - 1.40 mmol/L   HCT 39.0 39.0 - 52.0 %   Hemoglobin 13.3 13.0 - 17.0 g/dL   Sample type VENOUS    Comment NOTIFIED PHYSICIAN     CARDIAC CATHETERIZATION  Result Date: 12/10/2022 1.  Known severe bicuspid aortic valve stenosis with bulky aortic valve calcification visualized on plain fluoroscopy 2.  Widely patent coronary arteries with normal left main, LAD, intermediate branch, and left circumflex.  Mild nonobstructive plaquing of a large, dominant RCA 3.  Normal  right heart hemodynamics with a pulmonary wedge pressure of 13 mmHg, mean PA pressure 22 mmHg, right atrial pressure 9 mmHg. Recommendations: Overnight observation due to social barriers, cardiac surgical consultation for aortic valve replacement    Review of Systems  Constitutional:  Negative for activity change and fatigue.  HENT: Negative.    Eyes: Negative.   Respiratory:  Negative for shortness of breath.   Cardiovascular:  Negative for chest pain, palpitations and leg swelling.  Gastrointestinal: Negative.   Endocrine: Negative.   Genitourinary: Negative.   Musculoskeletal: Negative.   Skin: Negative.   Allergic/Immunologic: Negative.   Neurological:  Negative for dizziness and syncope.  Hematological: Negative.   Psychiatric/Behavioral: Negative.     Blood pressure 137/89, pulse (!) 57, temperature 97.6 F (36.4 C), temperature source Oral, resp. rate 16, height 5\' 11"  (1.803 m), weight 106.6 kg, SpO2 99%. Physical Exam Constitutional:      Appearance: Normal appearance. He is obese.  HENT:     Mouth/Throat:     Mouth: Mucous membranes are moist.     Pharynx: Oropharynx is clear.  Eyes:     Extraocular Movements: Extraocular movements intact.     Conjunctiva/sclera: Conjunctivae normal.     Pupils: Pupils are equal, round, and reactive to light.  Neck:     Vascular: No carotid bruit.  Cardiovascular:     Rate and Rhythm: Normal rate and regular rhythm.     Heart sounds: Murmur heard.     Comments: 3/6 systolic murmur RSB, no diastolic murmur Pulmonary:     Effort: Pulmonary effort is normal.     Breath sounds: Normal breath sounds.  Abdominal:     General: There is no distension.     Tenderness: There is no abdominal tenderness.  Musculoskeletal:        General: No swelling.  Skin:    General: Skin is warm and dry.  Neurological:     General: No focal deficit present.     Mental Status: He is alert and oriented to person, place, and time.  Psychiatric:         Mood and Affect: Mood normal.        Behavior: Behavior normal.   ECHOCARDIOGRAM REPORT       Patient Name:   Jerry [name] Date of Exam: 08/24/2022  Medical Rec #:  [id number]        Height:       70.0 in  Accession #:    [id number]       Weight:       234.4 lb  Date of Birth:  01/09/1966        BSA:          2.233 m  Patient Age:    56 years         BP:           163/79 mmHg  Patient Gender: M                HR:           65 bpm.  [facility] Location[facility]   Procedure: 2D Echo, Cardiac Doppler and Color Doppler   Indications:    I35.0 Nonrheumatic aortic (valve) stenosis    History:        Patient has prior history of Echocardiogram examinations,  most                 recent 06/09/2020. Aortic Valve Disease; Risk                  Factors:Dyslipidemia. Aortic stenosis. Bicuspid aortic  valve.    Sonographer:   Sheralyn Boatman RDCS  Referring Phys: [id number] VISHNU P MALLIPEDDI     Sonographer Comments: Technically difficult study due to poor echo  windows. Patient appeared to have altered mantal status. Patient had  trouble following directions and fell asleep during exam. Aortic  doppler  was extremely difficult.  IMPRESSIONS     1. Left ventricular ejection fraction, by estimation, is 60 to 65%. The  left ventricle has normal function. The left ventricle has no regional  wall motion abnormalities. There is mild left ventricular hypertrophy.  Left ventricular diastolic parameters  are consistent with Grade I diastolic dysfunction (impaired relaxation).   2. Right ventricular systolic function is normal. The right ventricular  size is normal.   3. The mitral valve is grossly normal. No evidence of mitral valve  regurgitation. No evidence of mitral stenosis.   4. The aortic valve was not well visualized. Aortic valve regurgitation  is not visualized. Severe aortic valve stenosis. Aortic valve area, by VTI  measures 1.36 cm. Aortic valve mean gradient measures 32.0 mmHg. Aortic   valve Vmax measures 4.02 m/s.  Dimensionless index is 0.23.   5. Aortic dilatation noted. There is borderline dilatation of the aortic  root, measuring 37 mm. There is borderline dilatation of the ascending  aorta, measuring 36 mm.   6. The inferior vena cava is dilated in size but collapsibility is not  visualized.   Comparison(s): No significant change from prior study.   FINDINGS   Left Ventricle: Left ventricular ejection fraction, by estimation, is 60  to 65%. The left ventricle has normal function. The left ventricle has no  regional wall motion abnormalities. The left ventricular internal cavity  size was normal in size. There is   mild left ventricular hypertrophy. Left ventricular diastolic parameters  are consistent with Grade I diastolic dysfunction (impaired relaxation).   Right Ventricle: The right ventricular size is normal. No increase in  right ventricular wall thickness. Right ventricular systolic function is  normal.   Left Atrium: Left atrial size was normal in size.   Right Atrium: Right atrial size was normal in size.   Pericardium: There is no evidence of pericardial effusion.   Mitral Valve: The mitral valve is grossly normal. No evidence of mitral  valve regurgitation. No evidence of mitral valve stenosis.   Tricuspid Valve: The tricuspid valve is grossly normal. Tricuspid valve  regurgitation is not demonstrated. No evidence of tricuspid stenosis.   Aortic Valve: The aortic valve was not well visualized. Aortic valve  regurgitation is not visualized. Severe aortic stenosis is present. Aortic  valve mean gradient measures 32.0 mmHg. Aortic valve peak gradient  measures 64.6 mmHg. Aortic valve area, by  VTI measures 1.36 cm.   Pulmonic Valve: The pulmonic valve was not well visualized. Pulmonic valve  regurgitation is trivial. No evidence of pulmonic stenosis.   Aorta: Aortic dilatation noted. There is borderline dilatation of the  aortic root,  measuring 37 mm. There is borderline dilatation of the  ascending aorta, measuring 36 mm.   Venous: The inferior vena cava is dilated in size but collapsibility is  not visualized.   IAS/Shunts: The interatrial septum was not well visualized.     LEFT VENTRICLE  PLAX 2D  LVIDd:         4.40 cm     Diastology  LVIDs:         2.80 cm     LV e' medial:    5.38 cm/s  LV PW:         1.30 cm     LV E/e' medial:  12.5  LV IVS:        1.10 cm     LV e' lateral:   8.16 cm/s  LVOT diam:  2.60 cm     LV E/e' lateral: 8.2  LV SV:         106  LV SV Index:   48  LVOT Area:     5.31 cm    LV Volumes (MOD)  LV vol d, MOD A2C: 51.8 ml  LV vol d, MOD A4C: 87.2 ml  LV vol s, MOD A2C: 17.2 ml  LV vol s, MOD A4C: 33.7 ml  LV SV MOD A2C:     34.6 ml  LV SV MOD A4C:     87.2 ml  LV SV MOD BP:      45.7 ml   RIGHT VENTRICLE            IVC  RV S prime:     9.68 cm/s  IVC diam: 2.20 cm  TAPSE (M-mode): 2.1 cm   LEFT ATRIUM             Index        RIGHT ATRIUM          Index  LA diam:        4.00 cm 1.79 cm/m   RA Area:     7.17 cm  LA Vol (A2C):   23.1 ml 10.34 ml/m  RA Volume:   9.21 ml  4.12 ml/m  LA Vol (A4C):   26.8 ml 12.00 ml/m  LA Biplane Vol: 25.5 ml 11.42 ml/m   AORTIC VALVE  AV Area (Vmax):    1.21 cm  AV Area (Vmean):   1.25 cm  AV Area (VTI):     1.36 cm  AV Vmax:           402.00 cm/s  AV Vmean:          252.200 cm/s  AV VTI:            0.778 m  AV Peak Grad:      64.6 mmHg  AV Mean Grad:      32.0 mmHg  LVOT Vmax:         91.90 cm/s  LVOT Vmean:        59.600 cm/s  LVOT VTI:          0.200 m  LVOT/AV VTI ratio: 0.26    AORTA  Ao Root diam: 3.70 cm  Ao Asc diam:  3.55 cm   MITRAL VALVE  MV Area (PHT): 3.53 cm    SHUNTS  MV Decel Time: 215 msec    Systemic VTI:  0.20 m  MV E velocity: 67.05 cm/s  Systemic Diam: 2.60 cm  MV A velocity: 70.85 cm/s  MV E/A ratio:  0.95   Vishnu Priya Mallipeddi  Electronically signed by Winfield Rast Mallipeddi   Signature Date/Time: 08/24/2022/3:08:01 PM        Final     Narrative & Impression  CLINICAL DATA:  Bicuspid aortic valve with concerns for severe stenosis.   EXAM: Cardiac/Coronary CTA   TECHNIQUE: A non-contrast, gated CT scan was obtained with axial slices of 3 mm through the heart for calcium scoring. Calcium scoring was performed using the Agatston method. A 120 kV retrospective, gated, contrast cardiac scan was obtained. Gantry rotation speed was 250 msecs and collimation was 0.6 mm. Two sublingual nitroglycerin tablets (0.8 mg) were given. The 3D data set was reconstructed in 5% intervals of the 0-95% of the R-R cycle. Diastolic phases were analyzed on a dedicated workstation using MPR, MIP, and VRT modes. The patient received 95 cc of contrast.  FINDINGS: Image quality: Excellent.   Noise artifact is: Limited.   Aortic valve: Bicuspid aortic valve with fusion of the RCC/NCC without raphe (Sievers type). Severely calcified leaflets with restricted leaflet motion in systole. Aortic valve calcium score 3330.   Coronary Arteries:  Normal coronary origin.  Right dominance.   Left main: The left main is a large caliber vessel with a normal take off from the left coronary cusp that trifurcates into a LAD, LCX, and ramus intermedius. There is no plaque or stenosis.   Left anterior descending artery: The LAD contains minimal mixed density plaque (<25%). The LAD gives off 2 patent diagonal branches.   Ramus intermedius: Patent with no evidence of plaque or stenosis.   Left circumflex artery: The LCX is non-dominant and patent with no evidence of plaque or stenosis. The LCX gives off 1 patent obtuse marginal branch.   Right coronary artery: The RCA is dominant with normal take off from the right coronary cusp. There is minimal mixed density plaque (<25%). The RCA terminates as a PDA with mild calcified plaque (25-49%) and right posterolateral branch that is  patent.   Right Atrium: Right atrial size is within normal limits.   Right Ventricle: The right ventricular cavity is within normal limits.   Left Atrium: Left atrial size is normal in size with no left atrial appendage filling defect.   Left Ventricle: The ventricular cavity size is within normal limits.   Pulmonary arteries: Normal in size.   Pulmonary veins: Normal pulmonary venous drainage.   Pericardium: Normal thickness without significant effusion or calcium present.   Mitral valve: The mitral valve is normal without significant calcification.   Aorta: Ascending aorta measures 37 mm. Aortic root measures 38 mm in largest dimension.   Extra-cardiac findings: See attached radiology report for non-cardiac structures.   IMPRESSION: 1. Coronary calcium score of 64.6. This was 85th percentile for age-, sex, and race-matched controls.   2. Normal coronary origin with right dominance.   3. Minimal CAD (<25%) in the LAD and RCA.   4. Mild calcified plaque in the PDA (25-49%).   5. Bicuspid aortic valve with fusion of the RCC/NCC without raphe that is severely calcified with restricted leaflet movement.   6. Aortic valve calcium score 3330 consistent with severe aortic stenosis.   7. Normal ascending aorta and aortic root dimensions.   Lennie Odor, MD   Electronically Signed: By: Lennie Odor M.D. On: 10/28/2022 22:44     Physicians  Panel Physicians Referring Physician Case Authorizing Physician  Tonny Bollman, MD (Primary)     Procedures  RIGHT/LEFT HEART CATH AND CORONARY ANGIOGRAPHY   Conclusion  1.  Known severe bicuspid aortic valve stenosis with bulky aortic valve calcification visualized on plain fluoroscopy 2.  Widely patent coronary arteries with normal left main, LAD, intermediate branch, and left circumflex.  Mild nonobstructive plaquing of a large, dominant RCA 3.  Normal right heart hemodynamics with a pulmonary wedge pressure of 13  mmHg, mean PA pressure 22 mmHg, right atrial pressure 9 mmHg.   Recommendations: Overnight observation due to social barriers, cardiac surgical consultation for aortic valve replacement   Indications  Severe aortic stenosis [I35.0 (ICD-10-CM)]   Procedural Details  Technical Details INDICATION: Severe bicuspid aortic valve stenosis  PROCEDURAL DETAILS: There was an indwelling IV in a right antecubital vein.  I attempted to change the sheath out but a wire would not pass through the IV which appeared to be extravascular.  Using ultrasound guidance, I  was able to access an antecubital vein via a front wall puncture and a 4/5 French slender sheath was inserted. The right wrist was then prepped, draped, and anesthetized with 1% lidocaine. Using vascular ultrasound guidance a 5/6 French Slender sheath was placed in the right radial artery. Intra-arterial verapamil was administered through the radial artery sheath. IV heparin was administered after a JR4 catheter was advanced into the central aorta. A Swan-Ganz catheter was used for the right heart catheterization. Standard protocol was followed for recording of right heart pressures and sampling of oxygen saturations. Fick cardiac output was calculated. Standard Judkins catheters were used for selective coronary angiography. I did not attempt to cross the aortic valve.  There were no immediate procedural complications. The patient was transferred to the post catheterization recovery area for further monitoring.      Estimated blood loss <50 mL.   During this procedure medications were administered to achieve and maintain moderate conscious sedation while the patient's heart rate, blood pressure, and oxygen saturation were continuously monitored and I was present face-to-face 100% of this time.   Medications (Filter: Administrations occurring from 0731 to 0829 on 12/10/22)  important  Continuous medications are totaled by the amount administered  until 12/10/22 0829.   fentaNYL (SUBLIMAZE) injection (mcg)  Total dose: 25 mcg Date/Time Rate/Dose/Volume Action   12/10/22 0748 25 mcg Given   midazolam (VERSED) injection (mg)  Total dose: 2 mg Date/Time Rate/Dose/Volume Action   12/10/22 0749 2 mg Given   lidocaine (PF) (XYLOCAINE) 1 % injection (mL)  Total volume: 4 mL Date/Time Rate/Dose/Volume Action   12/10/22 0752 2 mL Given   0753 2 mL Given   Heparin (Porcine) in NaCl 1000-0.9 UT/500ML-% SOLN (mL)  Total volume: 1,000 mL Date/Time Rate/Dose/Volume Action   12/10/22 0753 500 mL Given   0753 500 mL Given   Radial Cocktail/Verapamil only (mL)  Total volume: 10 mL Date/Time Rate/Dose/Volume Action   12/10/22 0801 10 mL Given   heparin sodium (porcine) injection (Units)  Total dose: 5,000 Units Date/Time Rate/Dose/Volume Action   12/10/22 0808 5,000 Units Given   iohexol (OMNIPAQUE) 350 MG/ML injection (mL)  Total volume: 53 mL Date/Time Rate/Dose/Volume Action   12/10/22 0818 53 mL Given    Sedation Time  Sedation Time Physician-1: 24 minutes 33 seconds Contrast     Administrations occurring from 0731 to 0829 on 12/10/22:  Medication Name Total Dose  iohexol (OMNIPAQUE) 350 MG/ML injection 53 mL   Radiation/Fluoro  Fluoro time: 5.6 (min) DAP: 22080 (mGycm2) Cumulative Air Kerma: 409 (mGy) Complications  Complications documented before study signed (12/10/2022  8:31 AM)   No complications were associated with this study.  Documented by Mila Palmer, RN - 12/10/2022  8:28 AM     Coronary Findings  Diagnostic Dominance: Right Left Main  Vessel is angiographically normal. Patent vessel with no obstructive disease, divides into the LAD, intermediate branch, and circumflex    Left Anterior Descending  Patent vessel reaches the LV apex, no obstructive disease throughout the LAD or diagonal branches    Ramus Intermedius  Vessel is large. Vessel is angiographically normal. Large intermediate branch is  widely patent with no stenosis    Left Circumflex  Vessel is small. Vessel is angiographically normal. Small AV circumflex is patent with no stenosis    Right Coronary Artery  Vessel is large. The vessel exhibits minimal luminal irregularities. Large, dominant RCA is patent. The vessel supplies a PDA and 2 posterolateral branches. There are no stenoses  throughout the RCA distribution. There is mild proximal plaquing and mild ectasia of the distal vessel    Intervention   No interventions have been documented.   Left Heart  Aortic Valve There is severe aortic valve stenosis. The aortic valve is calcified. There is restricted aortic valve motion.    Assessment/Plan:  This 57 year old gentleman has stage C severe bicuspid aortic valve stenosis.  He denies any symptoms but does not exercise at all and has a fairly sedentary lifestyle.  He did have an exercise treadmill test that showed evidence of ischemia with exercise although he had no chest pain.  The test was stopped due to shortness of breath and tired legs.  I personally reviewed his 2D echocardiogram, cardiac catheterization, and CTA studies.  His echo shows a mean gradient of 32 mmHg across the aortic valve with a dimensionless index of 0.23 and a peak velocity of 4.0 m/s consistent with severe aortic stenosis.  His gated cardiac CTA shows a severely calcified and restricted bicuspid aortic valve with a calcium score of 3330 consistent with severe aortic stenosis.  Cardiac catheterization shows no significant coronary disease.  I agree that aortic valve replacement is indicated in this patient to prevent progressive left ventricular dysfunction and sudden decompensation given his positive ETT.  I would recommend using a bioprosthetic valve in this 57 year old given his medical history and lack of his social support system.  I reviewed the above studies with him and answered all of his questions.  He would like to proceed with surgery but will  probably want a wait until January to get it done.  He is going to go home tomorrow and my office will contact him to set up surgery. I discussed the operative procedure with the patient including alternatives, benefits and risks; including but not limited to bleeding, blood transfusion, infection, stroke, myocardial infarction, graft failure, heart block requiring a permanent pacemaker, organ dysfunction, and death.  Jerry [name] understands and agrees to proceed.      Payton Doughty Kimari Coudriet 12/10/2022, 3:10 PM

## 2022-12-10 NOTE — Interval H&P Note (Signed)
History and Physical Interval Note:  12/10/2022 7:46 AM  Jerry [name]  has presented today for surgery, with the diagnosis of aortic stenosis.  The various methods of treatment have been discussed with the patient and family. After consideration of risks, benefits and other options for treatment, the patient has consented to  Procedure(s): RIGHT/LEFT HEART CATH AND CORONARY ANGIOGRAPHY (N/A) as a surgical intervention.  The patient's history has been reviewed, patient examined, no change in status, stable for surgery.  I have reviewed the patient's chart and labs.  Questions were answered to the patient's satisfaction.     Tonny Bollman

## 2022-12-10 NOTE — Plan of Care (Signed)

## 2022-12-11 DIAGNOSIS — I350 Nonrheumatic aortic (valve) stenosis: Secondary | ICD-10-CM | POA: Diagnosis not present

## 2022-12-11 DIAGNOSIS — I352 Nonrheumatic aortic (valve) stenosis with insufficiency: Secondary | ICD-10-CM | POA: Diagnosis not present

## 2022-12-11 NOTE — Discharge Summary (Signed)
Discharge Summary    Patient ID: Jerry [name] MRN: [id number]; DOB: Jan 29, 1966  Admit date: 12/10/2022 Discharge date: 12/11/2022  PCP:  Donetta Potts, MD    [facility] Providers Cardiologist:  Marjo Bicker, MD        Discharge Diagnoses    Principal Problem:   Severe aortic stenosis  Diagnostic Studies/Procedures    Cardiac Studies & Procedures   CARDIAC CATHETERIZATION  CARDIAC CATHETERIZATION 12/10/2022  Narrative 1.  Known severe bicuspid aortic valve stenosis with bulky aortic valve calcification visualized on plain fluoroscopy 2.  Widely patent coronary arteries with normal left main, LAD, intermediate branch, and left circumflex.  Mild nonobstructive plaquing of a large, dominant RCA 3.  Normal right heart hemodynamics with a pulmonary wedge pressure of 13 mmHg, mean PA pressure 22 mmHg, right atrial pressure 9 mmHg.  Recommendations: Overnight observation due to social barriers, cardiac surgical consultation for aortic valve replacement  Findings Coronary Findings Diagnostic  Dominance: Right  Left Main Vessel is angiographically normal. Patent vessel with no obstructive disease, divides into the LAD, intermediate branch, and circumflex  Left Anterior Descending Patent vessel reaches the LV apex, no obstructive disease throughout the LAD or diagonal branches  Ramus Intermedius Vessel is large. Vessel is angiographically normal. Large intermediate branch is widely patent with no stenosis  Left Circumflex Vessel is small. Vessel is angiographically normal. Small AV circumflex is patent with no stenosis  Right Coronary Artery Vessel is large. The vessel exhibits minimal luminal irregularities. Large, dominant RCA is patent.  The vessel supplies a PDA and 2 posterolateral branches.  There are no stenoses throughout the RCA distribution.  There is mild proximal plaquing and mild ectasia of the distal vessel  Intervention  No  interventions have been documented.   Diagnostic Dominance: Right         _____________   History of Present Illness     Jerry [name] is a 57 y.o. 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 Patient was referred to [facility] in May and was seen by Dr. Jenene Slicker for management of severe AS. Prior echocardiogram in May of 2022 had shown normal LVEF with bicuspid AV with severe stenosis, mean gradient of 38.2, peak 68.3mmHg, valve area by VTI 0.8cm2 with peak transaortic velocity of 4.2 m/s. A repeat echocardiogram was ordered and found stable LV function, mean gradient 32, peak gradient 65, peak velocity of 4.0 m/s, DI of 0.23. Given stability of AV, ETT recommended along with gated cardiac CTA TAVR protocol. CT showed aortic valve calcium score of 3330. Minimal CAD in LAD and RCA, 25-49% calcified plaque in PDA. ETT with poor exercise capacity and ST depression on exertion. Given these findings, LHC/RHC arranged.   Hospital Course     Consultants: cardiothoracic surgery   Bicuspid aortic valve with severe stenosis Mild non-obstructive CAD.  Patient completed LHC/RHC with Dr. Excell Seltzer on 11/4. Known severe bicuspid aortic valve stenosis with significant calcification visualized on fluoroscopy. Widely patent coronary arteries with normal LM, LAD, intermediate branch, LCX. Mild non-obstructive plaquing of large, dominant RCA. RHC with normal right heart hemodynamics as noted above. CTCS consulted with Dr. Laneta Simmers ultimately recommending surgical bioprosthetic valve, tentatively January 2025. Patient should continue to be vigilant for acute dyspnea, chest pain.    Hyperlipidemia  No recent lipid panel available since starting statin therapy. Patient on Crestor 10mg , up from 5mg . Needs repeat study, can be completed at his outpatient  follow up in 1 month. LDL goal <100.  Hypertension  BP generally well  controlled this admission on Amlodipine 10mg  (increased from 5mg  at 10/05/22 office visit). Continue with this dosing at discharge.       Did the patient have an acute coronary syndrome (MI, NSTEMI, STEMI, etc) this admission?:  No                               Did the patient have a percutaneous coronary intervention (stent / angioplasty)?:  No.     _____________  Discharge Vitals Blood pressure 137/88, pulse 62, temperature 98.2 F (36.8 C), temperature source Oral, resp. rate 18, height 5\' 11"  (1.803 m), weight 106.8 kg, SpO2 95%.  Filed Weights   12/10/22 0609 12/11/22 0320  Weight: 106.6 kg 106.8 kg   Physical Exam Vitals reviewed.  Constitutional:      Appearance: Normal appearance.  HENT:     Head: Normocephalic.  Eyes:     Pupils: Pupils are equal, round, and reactive to light.  Cardiovascular:     Rate and Rhythm: Normal rate and regular rhythm.     Pulses: Normal pulses.     Heart sounds: Normal heart sounds.  Pulmonary:     Effort: Pulmonary effort is normal.     Breath sounds: Normal breath sounds.  Abdominal:     General: Abdomen is flat.  Musculoskeletal:     Right lower leg: No edema.     Left lower leg: No edema.  Skin:    General: Skin is warm and dry.     Capillary Refill: Capillary refill takes less than 2 seconds.     Comments: Right radial access site stable without bleeding, hematoma, swelling.  Neurological:     General: No focal deficit present.     Mental Status: He is alert and oriented to person, place, and time.  Psychiatric:        Mood and Affect: Mood normal.        Behavior: Behavior normal.        Thought Content: Thought content normal.        Judgment: Judgment normal.      Labs & Radiologic Studies    CBC Recent Labs    12/10/22 0908  HGB 13.3  13.6  HCT 39.0  40.0   Basic Metabolic Panel Recent Labs    09/81/19 0908  NA 136  140  K 3.6  3.9   Liver Function Tests No results for input(s): "AST", "ALT",  "ALKPHOS", "BILITOT", "PROT", "ALBUMIN" in the last 72 hours. No results for input(s): "LIPASE", "AMYLASE" in the last 72 hours. High Sensitivity Troponin:   No results for input(s): "TROPONINIHS" in the last 720 hours.  BNP Invalid input(s): "POCBNP" D-Dimer No results for input(s): "DDIMER" in the last 72 hours. Hemoglobin A1C No results for input(s): "HGBA1C" in the last 72 hours. Fasting Lipid Panel No results for input(s): "CHOL", "HDL", "LDLCALC", "TRIG", "CHOLHDL", "LDLDIRECT" in the last 72 hours. Thyroid Function Tests No results for input(s): "TSH", "T4TOTAL", "T3FREE", "THYROIDAB" in the last 72 hours.  Invalid input(s): "FREET3" _____________  CARDIAC CATHETERIZATION  Result Date: 12/10/2022 1.  Known severe bicuspid aortic valve stenosis with bulky aortic valve calcification visualized on plain fluoroscopy 2.  Widely patent coronary arteries with normal left main, LAD, intermediate branch, and left circumflex.  Mild nonobstructive plaquing of a large, dominant RCA 3.  Normal right heart hemodynamics with  a pulmonary wedge pressure of 13 mmHg, mean PA pressure 22 mmHg, right atrial pressure 9 mmHg. Recommendations: Overnight observation due to social barriers, cardiac surgical consultation for aortic valve replacement   Disposition   Pt is being discharged home today in good condition.  Follow-up Plans & Appointments   Dr. Jenene Slicker 01/11/23 @9 :00.  Discharge Medications   Allergies as of 12/11/2022   No Known Allergies      Medication List     TAKE these medications    amLODipine 10 MG tablet Commonly known as: NORVASC Take 1 tablet (10 mg total) by mouth daily.   divalproex 500 MG 24 hr tablet Commonly known as: DEPAKOTE ER TAKE 3 TABLETS BY MOUTH IN THE MORNING AND 3 IN THE EVENING   phenytoin 100 MG ER capsule Commonly known as: DILANTIN Take 2 capsules (200 mg total) by mouth at bedtime.   rosuvastatin 10 MG tablet Commonly known as: CRESTOR Take  10 mg by mouth every evening.   VITAMIN D-3 PO Take 1 tablet by mouth in the morning.          Outstanding Labs/Studies   Duration of Discharge Encounter   Greater than 30 minutes including physician time.  Con Memos, PA-C 12/11/2022, 9:24 AM

## 2022-12-12 ENCOUNTER — Other Ambulatory Visit: Payer: Self-pay | Admitting: *Deleted

## 2022-12-12 DIAGNOSIS — I350 Nonrheumatic aortic (valve) stenosis: Secondary | ICD-10-CM

## 2022-12-13 ENCOUNTER — Encounter: Payer: Self-pay | Admitting: *Deleted

## 2022-12-13 ENCOUNTER — Encounter: Payer: BC Managed Care – PPO | Admitting: Surgery

## 2022-12-15 NOTE — Procedures (Signed)
   HISTORY: 57 year old male with history of preaccident seizure  TECHNIQUE:  This is a routine 16 channel EEG recording with one channel devoted to a limited EKG recording.  It was performed during wakefulness, drowsiness and asleep.  Hyperventilation and photic stimulation were performed as activating procedures.  There are minimum muscle and movement artifact noted.  Upon maximum arousal, posterior dominant waking rhythm consistent of rhythmic alpha range activity. Activities are symmetric over the bilateral posterior derivations and attenuated with eye opening.  Photic stimulation did not alter the tracing.  Hyperventilation produced mild/moderate buildup with higher amplitude and the slower activities noted.  During EEG recording, patient developed drowsiness and no deeper stage of sleep was achieved During EEG recording, there was no epileptiform discharge noted.  EKG demonstrate normal sinus rhythm.  CONCLUSION: This is a  normal awake EEG.  There is no electrodiagnostic evidence of epileptiform discharge.  Levert Feinstein, M.D. Ph.D.  [facility] [address] [place], [place] [address] Phone: [phone] Fax:      [phone]

## 2023-01-11 ENCOUNTER — Encounter: Payer: BC Managed Care – PPO | Admitting: Internal Medicine

## 2023-01-11 NOTE — Progress Notes (Signed)
Erroneous encounter - please disregard.

## 2023-02-05 NOTE — Pre-Procedure Instructions (Signed)
 Surgical Instructions   Your procedure is scheduled on February 07, 2022. Report to [facility] Main Entrance A at 5:30 A.M., then check in with the Admitting office. Any questions or running late day of surgery: call [phone]  Questions prior to your surgery date: call [phone], Monday-Friday, 8am-4pm. If you experience any cold or flu symptoms such as cough, fever, chills, shortness of breath, etc. between now and your scheduled surgery, please notify us  at the above number.     Remember:  Do not eat or drink after midnight the night before your surgery    Take these medicines the morning of surgery with A SIP OF WATER: amLODipine  (NORVASC )  divalproex  (DEPAKOTE  ER)    One week prior to surgery, STOP taking any Aspirin  (unless otherwise instructed by your surgeon) Aleve , Naproxen , Ibuprofen, Motrin, Advil, Goody's, BC's, all herbal medications, fish oil, and non-prescription vitamins.                     Do NOT Smoke (Tobacco/Vaping) for 24 hours prior to your procedure.  If you use a CPAP at night, you may bring your mask/headgear for your overnight stay.   You will be asked to remove any contacts, glasses, piercing's, hearing aid's, dentures/partials prior to surgery. Please bring cases for these items if needed.    Patients discharged the day of surgery will not be allowed to drive home, and someone needs to stay with them for 24 hours.  SURGICAL WAITING ROOM VISITATION Patients may have no more than 2 support people in the waiting area - these visitors may rotate.   Pre-op nurse will coordinate an appropriate time for 1 ADULT support person, who may not rotate, to accompany patient in pre-op.  Children under the age of 69 must have an adult with them who is not the patient and must remain in the main waiting area with an adult.  If the patient needs to stay at the hospital during part of their recovery, the visitor guidelines for inpatient rooms apply.  Please refer  to the [facility] website for the visitor guidelines for any additional information.   If you received a COVID test during your pre-op visit  it is requested that you wear a mask when out in public, stay away from anyone that may not be feeling well and notify your surgeon if you develop symptoms. If you have been in contact with anyone that has tested positive in the last 10 days please notify you surgeon.      Pre-operative CHG Bathing Instructions   You can play a key role in reducing the risk of infection after surgery. Your skin needs to be as free of germs as possible. You can reduce the number of germs on your skin by washing with CHG (chlorhexidine  gluconate) soap before surgery. CHG is an antiseptic soap that kills germs and continues to kill germs even after washing.   DO NOT use if you have an allergy to chlorhexidine /CHG or antibacterial soaps. If your skin becomes reddened or irritated, stop using the CHG and notify one of our RNs at [phone].              TAKE A SHOWER THE NIGHT BEFORE SURGERY AND THE DAY OF SURGERY    Please keep in mind the following:  DO NOT shave, including legs and underarms, 48 hours prior to surgery.   You may shave your face before/day of surgery.  Place clean sheets on your bed the  night before surgery Use a clean washcloth (not used since being washed) for each shower. DO NOT sleep with pet's night before surgery.  CHG Shower Instructions:  Wash your face and private area with normal soap. If you choose to wash your hair, wash first with your normal shampoo.  After you use shampoo/soap, rinse your hair and body thoroughly to remove shampoo/soap residue.  Turn the water OFF and apply half the bottle of CHG soap to a CLEAN washcloth.  Apply CHG soap ONLY FROM YOUR NECK DOWN TO YOUR TOES (washing for 3-5 minutes)  DO NOT use CHG soap on face, private areas, open wounds, or sores.  Pay special attention to the area where your surgery is being  performed.  If you are having back surgery, having someone wash your back for you may be helpful. Wait 2 minutes after CHG soap is applied, then you may rinse off the CHG soap.  Pat dry with a clean towel  Put on clean pajamas    Additional instructions for the day of surgery: DO NOT APPLY any lotions, deodorants, cologne, or perfumes.   Do not wear jewelry or makeup Do not wear nail polish, gel polish, artificial nails, or any other type of covering on natural nails (fingers and toes) Do not bring valuables to the hospital. [facility] is not responsible for valuables/personal belongings. Put on clean/comfortable clothes.  Please brush your teeth.  Ask your nurse before applying any prescription medications to the skin.

## 2023-02-07 ENCOUNTER — Other Ambulatory Visit: Payer: Self-pay

## 2023-02-07 ENCOUNTER — Encounter (HOSPITAL_COMMUNITY)
Admission: RE | Admit: 2023-02-07 | Discharge: 2023-02-07 | Disposition: A | Discharge status: Home / Self Care | Payer: BC Managed Care – PPO | Source: Ambulatory Visit | Attending: Surgery | Admitting: Surgery

## 2023-02-07 ENCOUNTER — Encounter (HOSPITAL_COMMUNITY): Payer: Self-pay

## 2023-02-07 ENCOUNTER — Inpatient Hospital Stay (HOSPITAL_BASED_OUTPATIENT_CLINIC_OR_DEPARTMENT_OTHER)
Admission: RE | Admit: 2023-02-07 | Discharge: 2023-02-07 | Disposition: A | Discharge status: Home / Self Care | Payer: BC Managed Care – PPO | Source: Ambulatory Visit | Attending: Surgery | Admitting: Surgery

## 2023-02-07 ENCOUNTER — Ambulatory Visit (HOSPITAL_COMMUNITY)
Admission: RE | Admit: 2023-02-07 | Discharge: 2023-02-07 | Disposition: A | Discharge status: Home / Self Care | Payer: BC Managed Care – PPO | Source: Ambulatory Visit | Attending: Surgery | Admitting: Surgery

## 2023-02-07 VITALS — BP 134/80 | HR 72 | Temp 97.9°F | Resp 18 | Ht 71.0 in | Wt 235.3 lb

## 2023-02-07 DIAGNOSIS — Z01818 Encounter for other preprocedural examination: Secondary | ICD-10-CM | POA: Insufficient documentation

## 2023-02-07 DIAGNOSIS — I350 Nonrheumatic aortic (valve) stenosis: Secondary | ICD-10-CM | POA: Insufficient documentation

## 2023-02-07 HISTORY — DX: Essential (primary) hypertension: I10

## 2023-02-07 HISTORY — DX: Cardiac murmur, unspecified: R01.1

## 2023-02-07 LAB — PROTIME-INR
INR: 1 (ref 0.8–1.2)
Prothrombin Time: 13.2 s (ref 11.4–15.2)

## 2023-02-07 LAB — URINALYSIS, ROUTINE W REFLEX MICROSCOPIC
Bilirubin Urine: NEGATIVE
Glucose, UA: NEGATIVE mg/dL
Hgb urine dipstick: NEGATIVE
Ketones, ur: NEGATIVE mg/dL
Leukocytes,Ua: NEGATIVE
Nitrite: NEGATIVE
Protein, ur: NEGATIVE mg/dL
Specific Gravity, Urine: 1.02 (ref 1.005–1.030)
pH: 6 (ref 5.0–8.0)

## 2023-02-07 LAB — SURGICAL PCR SCREEN
MRSA, PCR: NEGATIVE
Staphylococcus aureus: NEGATIVE

## 2023-02-07 LAB — COMPREHENSIVE METABOLIC PANEL
ALT: 20 U/L (ref 0–44)
AST: 21 U/L (ref 15–41)
Albumin: 4 g/dL (ref 3.5–5.0)
Alkaline Phosphatase: 59 U/L (ref 38–126)
Anion gap: 13 (ref 5–15)
BUN: 8 mg/dL (ref 6–20)
CO2: 27 mmol/L (ref 22–32)
Calcium: 9.3 mg/dL (ref 8.9–10.3)
Chloride: 102 mmol/L (ref 98–111)
Creatinine, Ser: 1.15 mg/dL (ref 0.61–1.24)
GFR, Estimated: >60 mL/min (ref >60)
Glucose, Bld: 81 mg/dL (ref 70–99)
Potassium: 3.4 mmol/L — ABNORMAL LOW (ref 3.5–5.1)
Sodium: 142 mmol/L (ref 135–145)
Total Bilirubin: 0.7 mg/dL (ref 0.0–1.2)
Total Protein: 7.1 g/dL (ref 6.5–8.1)

## 2023-02-07 LAB — CBC
HCT: 41.9 % (ref 39.0–52.0)
Hemoglobin: 14.7 g/dL (ref 13.0–17.0)
MCH: 33.3 pg (ref 26.0–34.0)
MCHC: 35.1 g/dL (ref 30.0–36.0)
MCV: 94.8 fL (ref 80.0–100.0)
Platelets: 182 10*3/uL (ref 150–400)
RBC: 4.42 MIL/uL (ref 4.22–5.81)
RDW: 13.3 % (ref 11.5–15.5)
WBC: 11.5 10*3/uL — ABNORMAL HIGH (ref 4.0–10.5)
nRBC: 0 % (ref 0.0–0.2)

## 2023-02-07 LAB — APTT: aPTT: 25 s (ref 24–36)

## 2023-02-07 LAB — HEMOGLOBIN A1C
Hgb A1c MFr Bld: 5 % (ref 4.8–5.6)
Mean Plasma Glucose: 97 mg/dL

## 2023-02-07 LAB — SARS CORONAVIRUS 2 BY RT PCR: SARS Coronavirus 2 by RT PCR: NEGATIVE

## 2023-02-07 MED ORDER — MANNITOL 20 % IV SOLN
INTRAVENOUS | Status: DC
  Filled 2023-02-07: qty 13

## 2023-02-07 MED ORDER — NITROGLYCERIN IN D5W 200-5 MCG/ML-% IV SOLN
2.0000 ug/min | INTRAVENOUS | Status: DC
  Filled 2023-02-07: qty 250

## 2023-02-07 MED ORDER — TRANEXAMIC ACID (OHS) PUMP PRIME SOLUTION
2.0000 mg/kg | INTRAVENOUS | Status: DC
  Filled 2023-02-07: qty 2.13

## 2023-02-07 MED ORDER — TRANEXAMIC ACID (OHS) BOLUS VIA INFUSION
15.0000 mg/kg | INTRAVENOUS | Status: AC
  Administered 2023-02-08: 1600.5 mg via INTRAVENOUS
  Filled 2023-02-07: qty 1601

## 2023-02-07 MED ORDER — INSULIN REGULAR(HUMAN) IN NACL 100-0.9 UT/100ML-% IV SOLN
INTRAVENOUS | Status: AC
  Administered 2023-02-08: 1.4 [IU]/h via INTRAVENOUS
  Filled 2023-02-07: qty 100

## 2023-02-07 MED ORDER — NOREPINEPHRINE 4 MG/250ML-% IV SOLN
0.0000 ug/min | INTRAVENOUS | Status: AC
  Administered 2023-02-08: 6 ug/min via INTRAVENOUS
  Filled 2023-02-07: qty 250

## 2023-02-07 MED ORDER — POTASSIUM CHLORIDE 2 MEQ/ML IV SOLN
80.0000 meq | INTRAVENOUS | Status: DC
  Filled 2023-02-07: qty 40

## 2023-02-07 MED ORDER — DEXMEDETOMIDINE HCL IN NACL 400 MCG/100ML IV SOLN
0.1000 ug/kg/h | INTRAVENOUS | Status: AC
  Administered 2023-02-08: .4 ug/kg/h via INTRAVENOUS
  Filled 2023-02-07: qty 100

## 2023-02-07 MED ORDER — EPINEPHRINE HCL 5 MG/250ML IV SOLN IN NS
0.0000 ug/min | INTRAVENOUS | Status: AC
  Administered 2023-02-08: 2 ug/min via INTRAVENOUS
  Filled 2023-02-07: qty 250

## 2023-02-07 MED ORDER — SODIUM CHLORIDE 0.9 % IV SOLN
1500.0000 mg | INTRAVENOUS | Status: AC
  Administered 2023-02-08: 1500 mg via INTRAVENOUS
  Filled 2023-02-07: qty 30

## 2023-02-07 MED ORDER — TRANEXAMIC ACID 1000 MG/10ML IV SOLN
1.5000 mg/kg/h | INTRAVENOUS | Status: AC
  Administered 2023-02-08: 1.5 mg/kg/h via INTRAVENOUS
  Filled 2023-02-07: qty 25

## 2023-02-07 MED ORDER — HEPARIN 30,000 UNITS/1000 ML (OHS) CELLSAVER SOLUTION
Status: DC
  Filled 2023-02-07: qty 1000

## 2023-02-07 MED ORDER — MILRINONE LACTATE IN DEXTROSE 20-5 MG/100ML-% IV SOLN
0.3000 ug/kg/min | INTRAVENOUS | Status: DC
  Filled 2023-02-07: qty 100

## 2023-02-07 MED ORDER — CEFAZOLIN SODIUM-DEXTROSE 2-4 GM/100ML-% IV SOLN
2.0000 g | INTRAVENOUS | Status: AC
  Administered 2023-02-08: 2 g via INTRAVENOUS
  Filled 2023-02-07: qty 100

## 2023-02-07 MED ORDER — PHENYLEPHRINE HCL-NACL 20-0.9 MG/250ML-% IV SOLN
30.0000 ug/min | INTRAVENOUS | Status: AC
  Administered 2023-02-08: 30 ug/min via INTRAVENOUS
  Filled 2023-02-07: qty 250

## 2023-02-07 MED ORDER — PLASMA-LYTE A IV SOLN
INTRAVENOUS | Status: DC
  Filled 2023-02-07: qty 2.5

## 2023-02-07 NOTE — H&P (Signed)
 [address]       [place] [address]             [phone]      Cardiothoracic Surgery Admission History and Physical   Reason for admission: Severe bicuspid aortic stenosis Referring Physician: Dr. Ozell Fell   Jerry [name] is an 58 y.o. male.  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  He has a history of bicuspid aortic stenosis that has been followed with echocardiogram.  A 2D echocardiogram in May 2022 showed a mean gradient of 38.2 mmHg and a peak of 69 mmHg with a valve area of 0.88 cm consistent with severe aortic stenosis.  He was asymptomatic and continued follow-up was recommended.  A follow-up echocardiogram on 08/24/2022 showed a mean gradient of 32 mmHg with a V-max of 4.02 m/s and a dimensionless index of 0.23.  Left ventricular ejection fraction is 60 to 65% with mild LVH.  There is grade 1 diastolic dysfunction.  A gated cardiac CTA on 10/26/2022 showed a bicuspid aortic valve with fusion of the right and noncoronary cusps.  There was severe leaflet calcification with restricted leaflet motion.  Aortic valve calcium  score was 3330.  There was felt to be minimal coronary disease in the LAD and RCA.  He underwent an exercise treadmill test on 11/02/2022 which showed evidence of ischemia during stress.  The test was stopped due to shortness of breath and leg discomfort.  Cardiac catheterization was recommended and that was done today showing mild nonobstructive coronary disease.  Right heart pressures were within normal limits.   The patient is single and has no siblings.  His parents are deceased.  There is no family history of aortic valve disease,  aneurysm, or aortic dissection.  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  He does not exercise at all.  He denies any exertional shortness of breath or fatigue.  He denies any chest discomfort.  He has had no dizziness or syncope.  He denies peripheral edema and orthopnea.       Past Medical History:  Diagnosis Date   Aortic stenosis     Head injury      Subdural hematoma after a traumatic brain injury in 1984   Mixed hyperlipidemia     Seizure (HCC)     Skin cancer     Varicose veins     Vasculitis (HCC) 11/2014               Past Surgical History:  Procedure Laterality Date   RIGHT/LEFT HEART CATH AND CORONARY ANGIOGRAPHY N/A 12/10/2022    Procedure: RIGHT/LEFT HEART CATH AND CORONARY ANGIOGRAPHY;  Surgeon: Fell Ozell, MD;  Location: [facility] INVASIVE CV LAB;  Service: Cardiovascular;  Laterality: N/A;   Surgical plate in head       TOE INTERNAL FIXATION W/ COMPRESSION SCREW                   Family History  Problem Relation Age of Onset   Diabetes Father            Social  History:  reports that he quit smoking about 8 years ago. His smoking use included cigarettes. He has never used smokeless tobacco. He reports that he does not drink alcohol and does not use drugs.   Allergies:  Allergies  No Known Allergies     Medications: I have reviewed the patient's current medications. Prior to Admission:         Medications Prior to Admission  Medication Sig Dispense Refill Last Dose   amLODipine  (NORVASC ) 10 MG tablet Take 1 tablet (10 mg total) by mouth daily. 180 tablet 3 12/09/2022   Cholecalciferol (VITAMIN D -3 PO) Take 1 tablet by mouth in the morning.     12/09/2022   divalproex  (DEPAKOTE  ER) 500 MG 24 hr tablet TAKE 3 TABLETS BY MOUTH IN THE MORNING AND 3 IN THE EVENING 540 tablet 3 12/09/2022   phenytoin  (DILANTIN ) 100 MG ER capsule Take 2 capsules (200 mg  total) by mouth at bedtime. 180 capsule 3 12/09/2022   rosuvastatin  (CRESTOR ) 10 MG tablet Take 10 mg by mouth every evening.     12/09/2022        Scheduled:  amLODipine   10 mg Oral Daily   divalproex   1,500 mg Oral BID   phenytoin   200 mg Oral QHS   rosuvastatin   10 mg Oral QPM   sodium chloride  flush  3 mL Intravenous Q12H        Continuous:  sodium chloride          PRN: sodium chloride , acetaminophen , ondansetron  (ZOFRAN ) IV, sodium chloride  flush     Anti-infectives(From admission, onward)        None             Lab Results Last 48 Hours       Results for orders placed or performed during the hospital encounter of 12/10/22 (from the past 48 hour(s))  I-STAT 7, (LYTES, BLD GAS, ICA, H+H)     Status: Abnormal    Collection Time: 12/10/22  9:08 AM  Result Value Ref Range    pH, Arterial 7.370 7.35 - 7.45    pCO2 arterial 39.5 32 - 48 mmHg    pO2, Arterial 80 (L) 83 - 108 mmHg    Bicarbonate 22.8 20.0 - 28.0 mmol/L    TCO2 24 22 - 32 mmol/L    O2 Saturation 95 %    Acid-base deficit 2.0 0.0 - 2.0 mmol/L    Sodium 140 135 - 145 mmol/L    Potassium 3.9 3.5 - 5.1 mmol/L    Calcium , Ion 1.26 1.15 - 1.40 mmol/L    HCT 40.0 39.0 - 52.0 %    Hemoglobin 13.6 13.0 - 17.0 g/dL    Sample type ARTERIAL    POCT I-Stat EG7     Status: Abnormal    Collection Time: 12/10/22  9:08 AM  Result Value Ref Range    pH, Ven 7.306 7.25 - 7.43    pCO2, Ven 44.8 44 - 60 mmHg    pO2, Ven 37 32 - 45 mmHg    Bicarbonate 22.4 20.0 - 28.0 mmol/L    TCO2 24 22 - 32 mmol/L    O2 Saturation 64 %    Acid-base deficit 4.0 (H) 0.0 - 2.0 mmol/L    Sodium 136 135 - 145 mmol/L    Potassium 3.6 3.5 - 5.1 mmol/L    Calcium , Ion 1.21 1.15 - 1.40 mmol/L    HCT 39.0 39.0 - 52.0 %    Hemoglobin 13.3 13.0 - 17.0  g/dL    Sample type VENOUS      Comment NOTIFIED PHYSICIAN           Imaging Results (Last 48 hours)  CARDIAC CATHETERIZATION   Result Date: 12/10/2022 1.  Known severe bicuspid  aortic valve stenosis with bulky aortic valve calcification visualized on plain fluoroscopy 2.  Widely patent coronary arteries with normal left main, LAD, intermediate branch, and left circumflex.  Mild nonobstructive plaquing of a large, dominant RCA 3.  Normal right heart hemodynamics with a pulmonary wedge pressure of 13 mmHg, mean PA pressure 22 mmHg, right atrial pressure 9 mmHg. Recommendations: Overnight observation due to social barriers, cardiac surgical consultation for aortic valve replacement       Review of Systems  Constitutional:  Negative for activity change and fatigue.  HENT: Negative.    Eyes: Negative.   Respiratory:  Negative for shortness of breath.   Cardiovascular:  Negative for chest pain, palpitations and leg swelling.  Gastrointestinal: Negative.   Endocrine: Negative.   Genitourinary: Negative.   Musculoskeletal: Negative.   Skin: Negative.   Allergic/Immunologic: Negative.   Neurological:  Negative for dizziness and syncope.  Hematological: Negative.   Psychiatric/Behavioral: Negative.      Blood pressure 137/89, pulse (!) 57, temperature 97.6 F (36.4 C), temperature source Oral, resp. rate 16, height 5' 11 (1.803 m), weight 106.6 kg, SpO2 99%. Physical Exam Constitutional:      Appearance: Normal appearance. He is obese.  HENT:     Mouth/Throat:     Mouth: Mucous membranes are moist.     Pharynx: Oropharynx is clear.  Eyes:     Extraocular Movements: Extraocular movements intact.     Conjunctiva/sclera: Conjunctivae normal.     Pupils: Pupils are equal, round, and reactive to light.  Neck:     Vascular: No carotid bruit.  Cardiovascular:     Rate and Rhythm: Normal rate and regular rhythm.     Heart sounds: Murmur heard.     Comments: 3/6 systolic murmur RSB, no diastolic murmur Pulmonary:     Effort: Pulmonary effort is normal.     Breath sounds: Normal breath sounds.  Abdominal:     General: There is no distension.     Tenderness: There is  no abdominal tenderness.  Musculoskeletal:        General: No swelling.  Skin:    General: Skin is warm and dry.  Neurological:     General: No focal deficit present.     Mental Status: He is alert and oriented to person, place, and time.  Psychiatric:        Mood and Affect: Mood normal.        Behavior: Behavior normal.    ECHOCARDIOGRAM REPORT       Patient Name:   Jerry [name] Date of Exam: 08/24/2022  Medical Rec #:  [id number]        Height:       70.0 in  Accession #:    [id number]       Weight:       234.4 lb  Date of Birth:  1965-03-30        BSA:          2.233 m  Patient Age:    56 years         BP:           163/79 mmHg  Patient Gender: M  HR:           65 bpm.  Exam Location:  [facility]   Procedure: 2D Echo, Cardiac Doppler and Color Doppler   Indications:    I35.0 Nonrheumatic aortic (valve) stenosis    History:        Patient has prior history of Echocardiogram examinations,  most                 recent 06/09/2020. Aortic Valve Disease; Risk                  Factors:Dyslipidemia. Aortic stenosis. Bicuspid aortic  valve.    Sonographer:   Ellouise Mose RDCS  Referring Phys: [id number] VISHNU P MALLIPEDDI     Sonographer Comments: Technically difficult study due to poor echo  windows. Patient appeared to have altered mantal status. Patient had  trouble following directions and fell asleep during exam. Aortic doppler  was extremely difficult.  IMPRESSIONS     1. Left ventricular ejection fraction, by estimation, is 60 to 65%. The  left ventricle has normal function. The left ventricle has no regional  wall motion abnormalities. There is mild left ventricular hypertrophy.  Left ventricular diastolic parameters  are consistent with Grade I diastolic dysfunction (impaired relaxation).   2. Right ventricular systolic function is normal. The right ventricular  size is normal.   3. The mitral valve is grossly normal. No evidence of mitral valve   regurgitation. No evidence of mitral stenosis.   4. The aortic valve was not well visualized. Aortic valve regurgitation  is not visualized. Severe aortic valve stenosis. Aortic valve area, by VTI  measures 1.36 cm. Aortic valve mean gradient measures 32.0 mmHg. Aortic  valve Vmax measures 4.02 m/s.  Dimensionless index is 0.23.   5. Aortic dilatation noted. There is borderline dilatation of the aortic  root, measuring 37 mm. There is borderline dilatation of the ascending  aorta, measuring 36 mm.   6. The inferior vena cava is dilated in size but collapsibility is not  visualized.   Comparison(s): No significant change from prior study.   FINDINGS   Left Ventricle: Left ventricular ejection fraction, by estimation, is 60  to 65%. The left ventricle has normal function. The left ventricle has no  regional wall motion abnormalities. The left ventricular internal cavity  size was normal in size. There is   mild left ventricular hypertrophy. Left ventricular diastolic parameters  are consistent with Grade I diastolic dysfunction (impaired relaxation).   Right Ventricle: The right ventricular size is normal. No increase in  right ventricular wall thickness. Right ventricular systolic function is  normal.   Left Atrium: Left atrial size was normal in size.   Right Atrium: Right atrial size was normal in size.   Pericardium: There is no evidence of pericardial effusion.   Mitral Valve: The mitral valve is grossly normal. No evidence of mitral  valve regurgitation. No evidence of mitral valve stenosis.   Tricuspid Valve: The tricuspid valve is grossly normal. Tricuspid valve  regurgitation is not demonstrated. No evidence of tricuspid stenosis.   Aortic Valve: The aortic valve was not well visualized. Aortic valve  regurgitation is not visualized. Severe aortic stenosis is present. Aortic  valve mean gradient measures 32.0 mmHg. Aortic valve peak gradient  measures 64.6 mmHg.  Aortic valve area, by  VTI measures 1.36 cm.   Pulmonic Valve: The pulmonic valve was not well visualized. Pulmonic valve  regurgitation is trivial. No evidence of pulmonic stenosis.  Aorta: Aortic dilatation noted. There is borderline dilatation of the  aortic root, measuring 37 mm. There is borderline dilatation of the  ascending aorta, measuring 36 mm.   Venous: The inferior vena cava is dilated in size but collapsibility is  not visualized.   IAS/Shunts: The interatrial septum was not well visualized.     LEFT VENTRICLE  PLAX 2D  LVIDd:         4.40 cm     Diastology  LVIDs:         2.80 cm     LV e' medial:    5.38 cm/s  LV PW:         1.30 cm     LV E/e' medial:  12.5  LV IVS:        1.10 cm     LV e' lateral:   8.16 cm/s  LVOT diam:     2.60 cm     LV E/e' lateral: 8.2  LV SV:         106  LV SV Index:   48  LVOT Area:     5.31 cm    LV Volumes (MOD)  LV vol d, MOD A2C: 51.8 ml  LV vol d, MOD A4C: 87.2 ml  LV vol s, MOD A2C: 17.2 ml  LV vol s, MOD A4C: 33.7 ml  LV SV MOD A2C:     34.6 ml  LV SV MOD A4C:     87.2 ml  LV SV MOD BP:      45.7 ml   RIGHT VENTRICLE            IVC  RV S prime:     9.68 cm/s  IVC diam: 2.20 cm  TAPSE (M-mode): 2.1 cm   LEFT ATRIUM             Index        RIGHT ATRIUM          Index  LA diam:        4.00 cm 1.79 cm/m   RA Area:     7.17 cm  LA Vol (A2C):   23.1 ml 10.34 ml/m  RA Volume:   9.21 ml  4.12 ml/m  LA Vol (A4C):   26.8 ml 12.00 ml/m  LA Biplane Vol: 25.5 ml 11.42 ml/m   AORTIC VALVE  AV Area (Vmax):    1.21 cm  AV Area (Vmean):   1.25 cm  AV Area (VTI):     1.36 cm  AV Vmax:           402.00 cm/s  AV Vmean:          252.200 cm/s  AV VTI:            0.778 m  AV Peak Grad:      64.6 mmHg  AV Mean Grad:      32.0 mmHg  LVOT Vmax:         91.90 cm/s  LVOT Vmean:        59.600 cm/s  LVOT VTI:          0.200 m  LVOT/AV VTI ratio: 0.26    AORTA  Ao Root diam: 3.70 cm  Ao Asc diam:  3.55 cm   MITRAL VALVE   MV Area (PHT): 3.53 cm    SHUNTS  MV Decel Time: 215 msec    Systemic VTI:  0.20 m  MV E velocity: 67.05 cm/s  Systemic Diam: 2.60 cm  MV A velocity: 70.85 cm/s  MV E/A ratio:  0.95   Vishnu Priya Mallipeddi  Electronically signed by Diannah Late Mallipeddi  Signature Date/Time: 08/24/2022/3:08:01 PM        Final      Narrative & Impression  CLINICAL DATA:  Bicuspid aortic valve with concerns for severe stenosis.   EXAM: Cardiac/Coronary CTA   TECHNIQUE: A non-contrast, gated CT scan was obtained with axial slices of 3 mm through the heart for calcium  scoring. Calcium  scoring was performed using the Agatston method. A 120 kV retrospective, gated, contrast cardiac scan was obtained. Gantry rotation speed was 250 msecs and collimation was 0.6 mm. Two sublingual nitroglycerin  tablets (0.8 mg) were given. The 3D data set was reconstructed in 5% intervals of the 0-95% of the R-R cycle. Diastolic phases were analyzed on a dedicated workstation using MPR, MIP, and VRT modes. The patient received 95 cc of contrast.   FINDINGS: Image quality: Excellent.   Noise artifact is: Limited.   Aortic valve: Bicuspid aortic valve with fusion of the RCC/NCC without raphe (Sievers type). Severely calcified leaflets with restricted leaflet motion in systole. Aortic valve calcium  score 3330.   Coronary Arteries:  Normal coronary origin.  Right dominance.   Left main: The left main is a large caliber vessel with a normal take off from the left coronary cusp that trifurcates into a LAD, LCX, and ramus intermedius. There is no plaque or stenosis.   Left anterior descending artery: The LAD contains minimal mixed density plaque (<25%). The LAD gives off 2 patent diagonal branches.   Ramus intermedius: Patent with no evidence of plaque or stenosis.   Left circumflex artery: The LCX is non-dominant and patent with no evidence of plaque or stenosis. The LCX gives off 1 patent  obtuse marginal branch.   Right coronary artery: The RCA is dominant with normal take off from the right coronary cusp. There is minimal mixed density plaque (<25%). The RCA terminates as a PDA with mild calcified plaque (25-49%) and right posterolateral branch that is patent.   Right Atrium: Right atrial size is within normal limits.   Right Ventricle: The right ventricular cavity is within normal limits.   Left Atrium: Left atrial size is normal in size with no left atrial appendage filling defect.   Left Ventricle: The ventricular cavity size is within normal limits.   Pulmonary arteries: Normal in size.   Pulmonary veins: Normal pulmonary venous drainage.   Pericardium: Normal thickness without significant effusion or calcium  present.   Mitral valve: The mitral valve is normal without significant calcification.   Aorta: Ascending aorta measures 37 mm. Aortic root measures 38 mm in largest dimension.   Extra-cardiac findings: See attached radiology report for non-cardiac structures.   IMPRESSION: 1. Coronary calcium  score of 64.6. This was 85th percentile for age-, sex, and race-matched controls.   2. Normal coronary origin with right dominance.   3. Minimal CAD (<25%) in the LAD and RCA.   4. Mild calcified plaque in the PDA (25-49%).   5. Bicuspid aortic valve with fusion of the RCC/NCC without raphe that is severely calcified with restricted leaflet movement.   6. Aortic valve calcium  score 3330 consistent with severe aortic stenosis.   7. Normal ascending aorta and aortic root dimensions.   Darryle Decent, MD   Electronically Signed: By: Darryle Decent M.D. On: 10/28/2022 22:44      Physicians   Panel Physicians Referring Physician Case Authorizing Physician  Wonda Sharper, MD (Primary)  Procedures   RIGHT/LEFT HEART CATH AND CORONARY ANGIOGRAPHY    Conclusion   1.  Known severe bicuspid aortic valve stenosis with bulky aortic valve  calcification visualized on plain fluoroscopy 2.  Widely patent coronary arteries with normal left main, LAD, intermediate branch, and left circumflex.  Mild nonobstructive plaquing of a large, dominant RCA 3.  Normal right heart hemodynamics with a pulmonary wedge pressure of 13 mmHg, mean PA pressure 22 mmHg, right atrial pressure 9 mmHg.   Recommendations: Overnight observation due to social barriers, cardiac surgical consultation for aortic valve replacement   Indications   Severe aortic stenosis [I35.0 (ICD-10-CM)]    Procedural Details   Technical Details INDICATION: Severe bicuspid aortic valve stenosis  PROCEDURAL DETAILS: There was an indwelling IV in a right antecubital vein.  I attempted to change the sheath out but a wire would not pass through the IV which appeared to be extravascular.  Using ultrasound guidance, I was able to access an antecubital vein via a front wall puncture and a 4/5 French slender sheath was inserted. The right wrist was then prepped, draped, and anesthetized with 1% lidocaine . Using vascular ultrasound guidance a 5/6 French Slender sheath was placed in the right radial artery. Intra-arterial verapamil  was administered through the radial artery sheath. IV heparin  was administered after a JR4 catheter was advanced into the central aorta. A Swan-Ganz catheter was used for the right heart catheterization. Standard protocol was followed for recording of right heart pressures and sampling of oxygen saturations. Fick cardiac output was calculated. Standard Judkins catheters were used for selective coronary angiography. I did not attempt to cross the aortic valve.  There were no immediate procedural complications. The patient was transferred to the post catheterization recovery area for further monitoring.      Estimated blood loss <50 mL.   During this procedure medications were administered to achieve and maintain moderate conscious sedation while the patient's  heart rate, blood pressure, and oxygen saturation were continuously monitored and I was present face-to-face 100% of this time.    Medications (Filter: Administrations occurring from [id number] to 0829 on 12/10/22)  important  Continuous medications are totaled by the amount administered until 12/10/22 0829.    fentaNYL  (SUBLIMAZE ) injection (mcg)  Total dose: 25 mcg Date/Time Rate/Dose/Volume Action    12/10/22 0748 25 mcg Given    midazolam  (VERSED ) injection (mg)  Total dose: 2 mg Date/Time Rate/Dose/Volume Action    12/10/22 0749 2 mg Given    lidocaine  (PF) (XYLOCAINE ) 1 % injection (mL)  Total volume: 4 mL Date/Time Rate/Dose/Volume Action    12/10/22 0752 2 mL Given    0753 2 mL Given    Heparin  (Porcine) in NaCl 1000-0.9 UT/500ML-% SOLN (mL)  Total volume: 1,000 mL Date/Time Rate/Dose/Volume Action    12/10/22 0753 500 mL Given    0753 500 mL Given    Radial Cocktail/Verapamil  only (mL)  Total volume: 10 mL Date/Time Rate/Dose/Volume Action    12/10/22 0801 10 mL Given    heparin  sodium (porcine) injection (Units)  Total dose: 5,000 Units Date/Time Rate/Dose/Volume Action    12/10/22 0808 5,000 Units Given    iohexol  (OMNIPAQUE ) 350 MG/ML injection (mL)  Total volume: 53 mL Date/Time Rate/Dose/Volume Action    12/10/22 0818 53 mL Given      Sedation Time   Sedation Time Physician-1: 24 minutes 33 seconds Contrast        Administrations occurring from 0731 to 0829 on 12/10/22:  Medication Name Total Dose  iohexol  (OMNIPAQUE ) 350 MG/ML injection 53 mL    Radiation/Fluoro   Fluoro time: 5.6 (min) DAP: 22080 (mGycm2) Cumulative Air Kerma: 409 (mGy) Complications   Complications documented before study signed (12/10/2022  8:31 AM)    No complications were associated with this study.  Documented by Lysbeth Deward HERO, RN - 12/10/2022  8:28 AM      Coronary Findings   Diagnostic Dominance: Right Left Main  Vessel is angiographically normal. Patent vessel with no  obstructive disease, divides into the LAD, intermediate branch, and circumflex    Left Anterior Descending  Patent vessel reaches the LV apex, no obstructive disease throughout the LAD or diagonal branches    Ramus Intermedius  Vessel is large. Vessel is angiographically normal. Large intermediate branch is widely patent with no stenosis    Left Circumflex  Vessel is small. Vessel is angiographically normal. Small AV circumflex is patent with no stenosis    Right Coronary Artery  Vessel is large. The vessel exhibits minimal luminal irregularities. Large, dominant RCA is patent. The vessel supplies a PDA and 2 posterolateral branches. There are no stenoses throughout the RCA distribution. There is mild proximal plaquing and mild ectasia of the distal vessel    Intervention    No interventions have been documented.    Left Heart   Aortic Valve There is severe aortic valve stenosis. The aortic valve is calcified. There is restricted aortic valve motion.      Assessment/Plan:   This 58 year old gentleman has stage C severe bicuspid aortic valve stenosis.  He denies any symptoms but does not exercise at all and has a fairly sedentary lifestyle.  He did have an exercise treadmill test that showed evidence of ischemia with exercise although he had no chest pain.  The test was stopped due to shortness of breath and tired legs.  I personally reviewed his 2D echocardiogram, cardiac catheterization, and CTA studies.  His echo shows a mean gradient of 32 mmHg across the aortic valve with a dimensionless index of 0.23 and a peak velocity of 4.0 m/s consistent with severe aortic stenosis.  His gated cardiac CTA shows a severely calcified and restricted bicuspid aortic valve with a calcium  score of 3330 consistent with severe aortic stenosis.  Cardiac catheterization shows no significant coronary disease.  I agree that aortic valve replacement is indicated in this patient to prevent progressive left  ventricular dysfunction and sudden decompensation given his positive ETT.  I would recommend using a bioprosthetic valve in this 58 year old given his medical history and lack of his social support system.  I reviewed the above studies with him and answered all of his questions.  I discussed the operative procedure with the patient including alternatives, benefits and risks; including but not limited to bleeding, blood transfusion, infection, stroke, myocardial infarction, graft failure, heart block requiring a permanent pacemaker, organ dysfunction, and death.  Jerry [name] understands and agrees to proceed.         Euline Kimbler K Lindell Renfrew

## 2023-02-07 NOTE — Progress Notes (Signed)
 PCP - Dr. Vaughn Pouch at [facility] in [place] Cardiologist - Vishnu Mallipeddi Neurologist - Dr. Modena Callander  PPM/ICD - denies Device Orders - n/a Rep Notified - nA  Chest x-ray - 02/07/23 EKG - 02/07/23 Stress Test - 11/02/22 ECHO - 08/24/22 Cardiac Cath - 12/10/22  Sleep Study - denies CPAP - n/a  No DM  Last dose of GLP1 agonist-  n/a GLP1 instructions: n/a  Blood Thinner Instructions: n/a Aspirin  Instructions: n/a  ERAS Protcol - NPO   COVID TEST- pending   Anesthesia review: no  Patient denies shortness of breath, fever, cough and chest pain at PAT appointment   All instructions explained to the patient, with a verbal understanding of the material. Patient agrees to go over the instructions while at home for a better understanding. Patient also instructed to self quarantine after being tested for COVID-19. The opportunity to ask questions was provided.

## 2023-02-08 ENCOUNTER — Inpatient Hospital Stay (HOSPITAL_COMMUNITY)
Admission: RE | Admit: 2023-02-08 | Discharge: 2023-03-05 | DRG: 220 | Disposition: A | Discharge status: Rehabilitation Facility | Payer: BC Managed Care – PPO | Attending: Surgery | Admitting: Surgery

## 2023-02-08 ENCOUNTER — Inpatient Hospital Stay (HOSPITAL_COMMUNITY): Payer: BC Managed Care – PPO | Admitting: Anesthesiology

## 2023-02-08 ENCOUNTER — Inpatient Hospital Stay (HOSPITAL_COMMUNITY): Payer: BC Managed Care – PPO

## 2023-02-08 ENCOUNTER — Encounter (HOSPITAL_COMMUNITY): Payer: Self-pay | Admitting: Surgery

## 2023-02-08 ENCOUNTER — Encounter (HOSPITAL_COMMUNITY)
Admission: RE | Disposition: A | Discharge status: Other Acute Inpatient Hospital | Payer: Self-pay | Source: Home / Self Care | Attending: Surgery

## 2023-02-08 ENCOUNTER — Other Ambulatory Visit: Payer: Self-pay

## 2023-02-08 DIAGNOSIS — Z87828 Personal history of other (healed) physical injury and trauma: Secondary | ICD-10-CM

## 2023-02-08 DIAGNOSIS — Z87891 Personal history of nicotine dependence: Secondary | ICD-10-CM | POA: Diagnosis not present

## 2023-02-08 DIAGNOSIS — D62 Acute posthemorrhagic anemia: Secondary | ICD-10-CM | POA: Diagnosis not present

## 2023-02-08 DIAGNOSIS — N179 Acute kidney failure, unspecified: Secondary | ICD-10-CM | POA: Diagnosis not present

## 2023-02-08 DIAGNOSIS — I2510 Atherosclerotic heart disease of native coronary artery without angina pectoris: Secondary | ICD-10-CM | POA: Diagnosis present

## 2023-02-08 DIAGNOSIS — Z9889 Other specified postprocedural states: Secondary | ICD-10-CM | POA: Diagnosis not present

## 2023-02-08 DIAGNOSIS — R4189 Other symptoms and signs involving cognitive functions and awareness: Secondary | ICD-10-CM | POA: Diagnosis present

## 2023-02-08 DIAGNOSIS — K5900 Constipation, unspecified: Secondary | ICD-10-CM | POA: Diagnosis not present

## 2023-02-08 DIAGNOSIS — N189 Chronic kidney disease, unspecified: Secondary | ICD-10-CM | POA: Diagnosis present

## 2023-02-08 DIAGNOSIS — Z833 Family history of diabetes mellitus: Secondary | ICD-10-CM | POA: Diagnosis not present

## 2023-02-08 DIAGNOSIS — J90 Pleural effusion, not elsewhere classified: Secondary | ICD-10-CM | POA: Diagnosis not present

## 2023-02-08 DIAGNOSIS — Z8661 Personal history of infections of the central nervous system: Secondary | ICD-10-CM

## 2023-02-08 DIAGNOSIS — G40909 Epilepsy, unspecified, not intractable, without status epilepticus: Secondary | ICD-10-CM | POA: Diagnosis present

## 2023-02-08 DIAGNOSIS — D72829 Elevated white blood cell count, unspecified: Secondary | ICD-10-CM | POA: Diagnosis not present

## 2023-02-08 DIAGNOSIS — R5381 Other malaise: Secondary | ICD-10-CM | POA: Diagnosis not present

## 2023-02-08 DIAGNOSIS — R001 Bradycardia, unspecified: Secondary | ICD-10-CM | POA: Diagnosis not present

## 2023-02-08 DIAGNOSIS — I493 Ventricular premature depolarization: Secondary | ICD-10-CM | POA: Diagnosis not present

## 2023-02-08 DIAGNOSIS — Z953 Presence of xenogenic heart valve: Principal | ICD-10-CM

## 2023-02-08 DIAGNOSIS — I3139 Other pericardial effusion (noninflammatory): Secondary | ICD-10-CM | POA: Diagnosis not present

## 2023-02-08 DIAGNOSIS — K5901 Slow transit constipation: Secondary | ICD-10-CM | POA: Diagnosis not present

## 2023-02-08 DIAGNOSIS — Z952 Presence of prosthetic heart valve: Secondary | ICD-10-CM | POA: Diagnosis not present

## 2023-02-08 DIAGNOSIS — I129 Hypertensive chronic kidney disease with stage 1 through stage 4 chronic kidney disease, or unspecified chronic kidney disease: Secondary | ICD-10-CM | POA: Diagnosis present

## 2023-02-08 DIAGNOSIS — T380X5A Adverse effect of glucocorticoids and synthetic analogues, initial encounter: Secondary | ICD-10-CM | POA: Diagnosis present

## 2023-02-08 DIAGNOSIS — I471 Supraventricular tachycardia, unspecified: Secondary | ICD-10-CM | POA: Diagnosis not present

## 2023-02-08 DIAGNOSIS — Z8679 Personal history of other diseases of the circulatory system: Secondary | ICD-10-CM | POA: Diagnosis not present

## 2023-02-08 DIAGNOSIS — T502X5A Adverse effect of carbonic-anhydrase inhibitors, benzothiadiazides and other diuretics, initial encounter: Secondary | ICD-10-CM | POA: Diagnosis not present

## 2023-02-08 DIAGNOSIS — Z01818 Encounter for other preprocedural examination: Secondary | ICD-10-CM

## 2023-02-08 DIAGNOSIS — Z85828 Personal history of other malignant neoplasm of skin: Secondary | ICD-10-CM

## 2023-02-08 DIAGNOSIS — I480 Paroxysmal atrial fibrillation: Secondary | ICD-10-CM | POA: Diagnosis not present

## 2023-02-08 DIAGNOSIS — Z79899 Other long term (current) drug therapy: Secondary | ICD-10-CM | POA: Diagnosis not present

## 2023-02-08 DIAGNOSIS — D696 Thrombocytopenia, unspecified: Secondary | ICD-10-CM | POA: Diagnosis not present

## 2023-02-08 DIAGNOSIS — E782 Mixed hyperlipidemia: Secondary | ICD-10-CM | POA: Diagnosis present

## 2023-02-08 DIAGNOSIS — M549 Dorsalgia, unspecified: Secondary | ICD-10-CM | POA: Diagnosis not present

## 2023-02-08 DIAGNOSIS — Y92239 Unspecified place in hospital as the place of occurrence of the external cause: Secondary | ICD-10-CM | POA: Diagnosis not present

## 2023-02-08 DIAGNOSIS — I4891 Unspecified atrial fibrillation: Secondary | ICD-10-CM | POA: Diagnosis not present

## 2023-02-08 DIAGNOSIS — Z87820 Personal history of traumatic brain injury: Secondary | ICD-10-CM

## 2023-02-08 DIAGNOSIS — I4892 Unspecified atrial flutter: Secondary | ICD-10-CM | POA: Diagnosis not present

## 2023-02-08 DIAGNOSIS — Q2381 Bicuspid aortic valve: Secondary | ICD-10-CM | POA: Diagnosis not present

## 2023-02-08 DIAGNOSIS — I350 Nonrheumatic aortic (valve) stenosis: Principal | ICD-10-CM | POA: Diagnosis present

## 2023-02-08 DIAGNOSIS — R0902 Hypoxemia: Secondary | ICD-10-CM | POA: Diagnosis not present

## 2023-02-08 DIAGNOSIS — I491 Atrial premature depolarization: Secondary | ICD-10-CM | POA: Diagnosis not present

## 2023-02-08 DIAGNOSIS — I959 Hypotension, unspecified: Secondary | ICD-10-CM | POA: Diagnosis not present

## 2023-02-08 DIAGNOSIS — Z5971 Insufficient health insurance coverage: Secondary | ICD-10-CM

## 2023-02-08 HISTORY — PX: AORTIC VALVE REPLACEMENT: SHX41

## 2023-02-08 HISTORY — PX: TEE WITHOUT CARDIOVERSION: SHX5443

## 2023-02-08 LAB — CBC
HCT: 34.3 % — ABNORMAL LOW (ref 39.0–52.0)
HCT: 34.5 % — ABNORMAL LOW (ref 39.0–52.0)
Hemoglobin: 11.9 g/dL — ABNORMAL LOW (ref 13.0–17.0)
Hemoglobin: 11.9 g/dL — ABNORMAL LOW (ref 13.0–17.0)
MCH: 33.7 pg (ref 26.0–34.0)
MCH: 33.5 pg (ref 26.0–34.0)
MCHC: 34.7 g/dL (ref 30.0–36.0)
MCHC: 34.5 g/dL (ref 30.0–36.0)
MCV: 97.2 fL (ref 80.0–100.0)
MCV: 97.2 fL (ref 80.0–100.0)
Platelets: 126 10*3/uL — ABNORMAL LOW (ref 150–400)
Platelets: 144 10*3/uL — ABNORMAL LOW (ref 150–400)
RBC: 3.53 MIL/uL — ABNORMAL LOW (ref 4.22–5.81)
RBC: 3.55 MIL/uL — ABNORMAL LOW (ref 4.22–5.81)
RDW: 13.4 % (ref 11.5–15.5)
RDW: 13.6 % (ref 11.5–15.5)
WBC: 18.3 10*3/uL — ABNORMAL HIGH (ref 4.0–10.5)
WBC: 21.9 10*3/uL — ABNORMAL HIGH (ref 4.0–10.5)
nRBC: 0 % (ref 0.0–0.2)
nRBC: 0 % (ref 0.0–0.2)

## 2023-02-08 LAB — POCT I-STAT 7, (LYTES, BLD GAS, ICA,H+H)
Acid-Base Excess: 2 mmol/L (ref 0.0–2.0)
Acid-Base Excess: 2 mmol/L (ref 0.0–2.0)
Acid-base deficit: 2 mmol/L (ref 0.0–2.0)
Acid-base deficit: 3 mmol/L — ABNORMAL HIGH (ref 0.0–2.0)
Acid-base deficit: 2 mmol/L (ref 0.0–2.0)
Acid-base deficit: 4 mmol/L — ABNORMAL HIGH (ref 0.0–2.0)
Bicarbonate: 27.5 mmol/L (ref 20.0–28.0)
Bicarbonate: 26.7 mmol/L (ref 20.0–28.0)
Bicarbonate: 24.5 mmol/L (ref 20.0–28.0)
Bicarbonate: 23.7 mmol/L (ref 20.0–28.0)
Bicarbonate: 23.3 mmol/L (ref 20.0–28.0)
Bicarbonate: 20.9 mmol/L (ref 20.0–28.0)
Calcium, Ion: 1.1 mmol/L — ABNORMAL LOW (ref 1.15–1.40)
Calcium, Ion: 1.12 mmol/L — ABNORMAL LOW (ref 1.15–1.40)
Calcium, Ion: 1 mmol/L — ABNORMAL LOW (ref 1.15–1.40)
Calcium, Ion: 1.11 mmol/L — ABNORMAL LOW (ref 1.15–1.40)
Calcium, Ion: 1.1 mmol/L — ABNORMAL LOW (ref 1.15–1.40)
Calcium, Ion: 1.11 mmol/L — ABNORMAL LOW (ref 1.15–1.40)
HCT: 26 % — ABNORMAL LOW (ref 39.0–52.0)
HCT: 27 % — ABNORMAL LOW (ref 39.0–52.0)
HCT: 25 % — ABNORMAL LOW (ref 39.0–52.0)
HCT: 30 % — ABNORMAL LOW (ref 39.0–52.0)
HCT: 29 % — ABNORMAL LOW (ref 39.0–52.0)
HCT: 29 % — ABNORMAL LOW (ref 39.0–52.0)
Hemoglobin: 8.8 g/dL — ABNORMAL LOW (ref 13.0–17.0)
Hemoglobin: 9.2 g/dL — ABNORMAL LOW (ref 13.0–17.0)
Hemoglobin: 8.5 g/dL — ABNORMAL LOW (ref 13.0–17.0)
Hemoglobin: 10.2 g/dL — ABNORMAL LOW (ref 13.0–17.0)
Hemoglobin: 9.9 g/dL — ABNORMAL LOW (ref 13.0–17.0)
Hemoglobin: 9.9 g/dL — ABNORMAL LOW (ref 13.0–17.0)
O2 Saturation: 100 %
O2 Saturation: 100 %
O2 Saturation: 100 %
O2 Saturation: 97 %
O2 Saturation: 97 %
O2 Saturation: 96 %
Patient temperature: 37
Patient temperature: 36.6
Patient temperature: 36.5
Potassium: 3.6 mmol/L (ref 3.5–5.1)
Potassium: 4.8 mmol/L (ref 3.5–5.1)
Potassium: 3.8 mmol/L (ref 3.5–5.1)
Potassium: 3.7 mmol/L (ref 3.5–5.1)
Potassium: 4.3 mmol/L (ref 3.5–5.1)
Potassium: 4.1 mmol/L (ref 3.5–5.1)
Sodium: 143 mmol/L (ref 135–145)
Sodium: 141 mmol/L (ref 135–145)
Sodium: 142 mmol/L (ref 135–145)
Sodium: 143 mmol/L (ref 135–145)
Sodium: 143 mmol/L (ref 135–145)
Sodium: 142 mmol/L (ref 135–145)
TCO2: 29 mmol/L (ref 22–32)
TCO2: 28 mmol/L (ref 22–32)
TCO2: 26 mmol/L (ref 22–32)
TCO2: 25 mmol/L (ref 22–32)
TCO2: 24 mmol/L (ref 22–32)
TCO2: 22 mmol/L (ref 22–32)
pCO2 arterial: 49.6 mm[Hg] — ABNORMAL HIGH (ref 32–48)
pCO2 arterial: 42.8 mm[Hg] (ref 32–48)
pCO2 arterial: 46.8 mm[Hg] (ref 32–48)
pCO2 arterial: 51 mm[Hg] — ABNORMAL HIGH (ref 32–48)
pCO2 arterial: 38.4 mm[Hg] (ref 32–48)
pCO2 arterial: 34.6 mm[Hg] (ref 32–48)
pH, Arterial: 7.351 (ref 7.35–7.45)
pH, Arterial: 7.403 (ref 7.35–7.45)
pH, Arterial: 7.327 — ABNORMAL LOW (ref 7.35–7.45)
pH, Arterial: 7.275 — ABNORMAL LOW (ref 7.35–7.45)
pH, Arterial: 7.388 (ref 7.35–7.45)
pH, Arterial: 7.386 (ref 7.35–7.45)
pO2, Arterial: 266 mm[Hg] — ABNORMAL HIGH (ref 83–108)
pO2, Arterial: 339 mm[Hg] — ABNORMAL HIGH (ref 83–108)
pO2, Arterial: 401 mm[Hg] — ABNORMAL HIGH (ref 83–108)
pO2, Arterial: 101 mm[Hg] (ref 83–108)
pO2, Arterial: 87 mm[Hg] (ref 83–108)
pO2, Arterial: 82 mm[Hg] — ABNORMAL LOW (ref 83–108)

## 2023-02-08 LAB — GLUCOSE, CAPILLARY
Glucose-Capillary: 143 mg/dL — ABNORMAL HIGH (ref 70–99)
Glucose-Capillary: 121 mg/dL — ABNORMAL HIGH (ref 70–99)
Glucose-Capillary: 131 mg/dL — ABNORMAL HIGH (ref 70–99)
Glucose-Capillary: 136 mg/dL — ABNORMAL HIGH (ref 70–99)
Glucose-Capillary: 174 mg/dL — ABNORMAL HIGH (ref 70–99)
Glucose-Capillary: 127 mg/dL — ABNORMAL HIGH (ref 70–99)
Glucose-Capillary: 133 mg/dL — ABNORMAL HIGH (ref 70–99)
Glucose-Capillary: 153 mg/dL — ABNORMAL HIGH (ref 70–99)
Glucose-Capillary: 160 mg/dL — ABNORMAL HIGH (ref 70–99)

## 2023-02-08 LAB — POCT I-STAT, CHEM 8
BUN: 13 mg/dL (ref 6–20)
BUN: 13 mg/dL (ref 6–20)
BUN: 13 mg/dL (ref 6–20)
BUN: 13 mg/dL (ref 6–20)
BUN: 13 mg/dL (ref 6–20)
Calcium, Ion: 1.2 mmol/L (ref 1.15–1.40)
Calcium, Ion: 1.2 mmol/L (ref 1.15–1.40)
Calcium, Ion: 1.09 mmol/L — ABNORMAL LOW (ref 1.15–1.40)
Calcium, Ion: 1.02 mmol/L — ABNORMAL LOW (ref 1.15–1.40)
Calcium, Ion: 0.99 mmol/L — ABNORMAL LOW (ref 1.15–1.40)
Chloride: 107 mmol/L (ref 98–111)
Chloride: 108 mmol/L (ref 98–111)
Chloride: 107 mmol/L (ref 98–111)
Chloride: 107 mmol/L (ref 98–111)
Chloride: 106 mmol/L (ref 98–111)
Creatinine, Ser: 1.3 mg/dL — ABNORMAL HIGH (ref 0.61–1.24)
Creatinine, Ser: 1.4 mg/dL — ABNORMAL HIGH (ref 0.61–1.24)
Creatinine, Ser: 1.3 mg/dL — ABNORMAL HIGH (ref 0.61–1.24)
Creatinine, Ser: 1.3 mg/dL — ABNORMAL HIGH (ref 0.61–1.24)
Creatinine, Ser: 1.2 mg/dL (ref 0.61–1.24)
Glucose, Bld: 138 mg/dL — ABNORMAL HIGH (ref 70–99)
Glucose, Bld: 133 mg/dL — ABNORMAL HIGH (ref 70–99)
Glucose, Bld: 124 mg/dL — ABNORMAL HIGH (ref 70–99)
Glucose, Bld: 145 mg/dL — ABNORMAL HIGH (ref 70–99)
Glucose, Bld: 207 mg/dL — ABNORMAL HIGH (ref 70–99)
HCT: 34 % — ABNORMAL LOW (ref 39.0–52.0)
HCT: 34 % — ABNORMAL LOW (ref 39.0–52.0)
HCT: 26 % — ABNORMAL LOW (ref 39.0–52.0)
HCT: 23 % — ABNORMAL LOW (ref 39.0–52.0)
HCT: 26 % — ABNORMAL LOW (ref 39.0–52.0)
Hemoglobin: 11.6 g/dL — ABNORMAL LOW (ref 13.0–17.0)
Hemoglobin: 11.6 g/dL — ABNORMAL LOW (ref 13.0–17.0)
Hemoglobin: 8.8 g/dL — ABNORMAL LOW (ref 13.0–17.0)
Hemoglobin: 7.8 g/dL — ABNORMAL LOW (ref 13.0–17.0)
Hemoglobin: 8.8 g/dL — ABNORMAL LOW (ref 13.0–17.0)
Potassium: 3.8 mmol/L (ref 3.5–5.1)
Potassium: 3.8 mmol/L (ref 3.5–5.1)
Potassium: 4.6 mmol/L (ref 3.5–5.1)
Potassium: 4.9 mmol/L (ref 3.5–5.1)
Potassium: 3.9 mmol/L (ref 3.5–5.1)
Sodium: 142 mmol/L (ref 135–145)
Sodium: 141 mmol/L (ref 135–145)
Sodium: 141 mmol/L (ref 135–145)
Sodium: 141 mmol/L (ref 135–145)
Sodium: 142 mmol/L (ref 135–145)
TCO2: 24 mmol/L (ref 22–32)
TCO2: 25 mmol/L (ref 22–32)
TCO2: 27 mmol/L (ref 22–32)
TCO2: 27 mmol/L (ref 22–32)
TCO2: 26 mmol/L (ref 22–32)

## 2023-02-08 LAB — BASIC METABOLIC PANEL
Anion gap: 13 (ref 5–15)
BUN: 11 mg/dL (ref 6–20)
CO2: 19 mmol/L — ABNORMAL LOW (ref 22–32)
Calcium: 7.8 mg/dL — ABNORMAL LOW (ref 8.9–10.3)
Chloride: 110 mmol/L (ref 98–111)
Creatinine, Ser: 1.29 mg/dL — ABNORMAL HIGH (ref 0.61–1.24)
GFR, Estimated: >60 mL/min (ref >60)
Glucose, Bld: 171 mg/dL — ABNORMAL HIGH (ref 70–99)
Potassium: 4.4 mmol/L (ref 3.5–5.1)
Sodium: 142 mmol/L (ref 135–145)

## 2023-02-08 LAB — MAGNESIUM: Magnesium: 3.6 mg/dL — ABNORMAL HIGH (ref 1.7–2.4)

## 2023-02-08 LAB — POCT I-STAT EG7
Acid-Base Excess: 2 mmol/L (ref 0.0–2.0)
Bicarbonate: 27.8 mmol/L (ref 20.0–28.0)
Calcium, Ion: 1.11 mmol/L — ABNORMAL LOW (ref 1.15–1.40)
HCT: 27 % — ABNORMAL LOW (ref 39.0–52.0)
Hemoglobin: 9.2 g/dL — ABNORMAL LOW (ref 13.0–17.0)
O2 Saturation: 66 %
Potassium: 3.6 mmol/L (ref 3.5–5.1)
Sodium: 143 mmol/L (ref 135–145)
TCO2: 29 mmol/L (ref 22–32)
pCO2, Ven: 51.6 mm[Hg] (ref 44–60)
pH, Ven: 7.339 (ref 7.25–7.43)
pO2, Ven: 37 mm[Hg] (ref 32–45)

## 2023-02-08 LAB — ABO/RH: ABO/RH(D): A POS

## 2023-02-08 LAB — PLATELET COUNT: Platelets: 115 10*3/uL — ABNORMAL LOW (ref 150–400)

## 2023-02-08 LAB — APTT: aPTT: 33 s (ref 24–36)

## 2023-02-08 LAB — HEMOGLOBIN AND HEMATOCRIT, BLOOD
HCT: 26.4 % — ABNORMAL LOW (ref 39.0–52.0)
Hemoglobin: 9.5 g/dL — ABNORMAL LOW (ref 13.0–17.0)

## 2023-02-08 LAB — PROTIME-INR
INR: 1.5 — ABNORMAL HIGH (ref 0.8–1.2)
Prothrombin Time: 18.2 s — ABNORMAL HIGH (ref 11.4–15.2)

## 2023-02-08 SURGERY — REPLACEMENT, AORTIC VALVE, OPEN
Anesthesia: General | Site: Chest

## 2023-02-08 MED ORDER — DIVALPROEX SODIUM ER 500 MG PO TB24
500.0000 mg | ORAL_TABLET | Freq: Two times a day (BID) | ORAL | Status: DC
  Administered 2023-02-08 – 2023-02-09 (×2): 500 mg via ORAL
  Filled 2023-02-08 (×4): qty 1

## 2023-02-08 MED ORDER — FENTANYL CITRATE (PF) 250 MCG/5ML IJ SOLN
INTRAMUSCULAR | Status: DC | PRN
  Administered 2023-02-08: 50 ug via INTRAVENOUS
  Administered 2023-02-08: 100 ug via INTRAVENOUS
  Administered 2023-02-08 (×2): 50 ug via INTRAVENOUS
  Administered 2023-02-08: 100 ug via INTRAVENOUS
  Administered 2023-02-08: 50 ug via INTRAVENOUS
  Administered 2023-02-08: 150 ug via INTRAVENOUS
  Administered 2023-02-08 (×2): 50 ug via INTRAVENOUS

## 2023-02-08 MED ORDER — THROMBIN 20000 UNITS EX SOLR: OROMUCOSAL | Status: DC | PRN

## 2023-02-08 MED ORDER — HEPARIN SODIUM (PORCINE) 1000 UNIT/ML IJ SOLN
INTRAMUSCULAR | Status: AC
  Filled 2023-02-08: qty 1

## 2023-02-08 MED ORDER — ONDANSETRON HCL 4 MG/2ML IJ SOLN
4.0000 mg | Freq: Four times a day (QID) | INTRAMUSCULAR | Status: DC | PRN
  Administered 2023-02-17 – 2023-02-20 (×2): 4 mg via INTRAVENOUS
  Filled 2023-02-08 (×2): qty 2

## 2023-02-08 MED ORDER — PROPOFOL 10 MG/ML IV BOLUS
INTRAVENOUS | Status: AC
  Filled 2023-02-08: qty 20

## 2023-02-08 MED ORDER — LACTATED RINGERS IV SOLN
INTRAVENOUS | Status: AC
Start: 2023-02-08 — End: 2023-02-09

## 2023-02-08 MED ORDER — ASPIRIN 81 MG PO CHEW
324.0000 mg | CHEWABLE_TABLET | Freq: Once | ORAL | Status: AC
Start: 2023-02-08 — End: 2023-02-08
  Administered 2023-02-08: 324 mg via ORAL
  Filled 2023-02-08: qty 4

## 2023-02-08 MED ORDER — ROSUVASTATIN CALCIUM 5 MG PO TABS
10.0000 mg | ORAL_TABLET | Freq: Every evening | ORAL | Status: DC
  Administered 2023-02-08 – 2023-03-04 (×24): 10 mg via ORAL
  Filled 2023-02-08 (×25): qty 2

## 2023-02-08 MED ORDER — NOREPINEPHRINE 4 MG/250ML-% IV SOLN: 0.0000 ug/min | INTRAVENOUS | Status: DC

## 2023-02-08 MED ORDER — METOPROLOL TARTRATE 12.5 MG HALF TABLET
12.5000 mg | ORAL_TABLET | Freq: Once | ORAL | Status: AC
  Administered 2023-02-08: 12.5 mg via ORAL
  Filled 2023-02-08: qty 1

## 2023-02-08 MED ORDER — ROCURONIUM BROMIDE 10 MG/ML (PF) SYRINGE
PREFILLED_SYRINGE | INTRAVENOUS | Status: DC | PRN
  Administered 2023-02-08: 30 mg via INTRAVENOUS
  Administered 2023-02-08: 100 mg via INTRAVENOUS
  Administered 2023-02-08: 30 mg via INTRAVENOUS

## 2023-02-08 MED ORDER — SODIUM CHLORIDE 0.9% FLUSH
3.0000 mL | Freq: Two times a day (BID) | INTRAVENOUS | Status: DC
  Administered 2023-02-09 – 2023-02-16 (×12): 3 mL via INTRAVENOUS

## 2023-02-08 MED ORDER — PHENYTOIN SODIUM EXTENDED 100 MG PO CAPS
200.0000 mg | ORAL_CAPSULE | Freq: Every day | ORAL | Status: DC
  Administered 2023-02-08 – 2023-03-04 (×25): 200 mg via ORAL
  Filled 2023-02-08 (×28): qty 2

## 2023-02-08 MED ORDER — SODIUM CHLORIDE 0.9% FLUSH: 3.0000 mL | INTRAVENOUS | Status: DC | PRN

## 2023-02-08 MED ORDER — OXYCODONE HCL 5 MG PO TABS
5.0000 mg | ORAL_TABLET | ORAL | Status: DC | PRN
  Administered 2023-02-08 (×2): 10 mg via ORAL
  Administered 2023-02-09: 5 mg via ORAL
  Administered 2023-02-09 – 2023-02-10 (×5): 10 mg via ORAL
  Administered 2023-02-10: 5 mg via ORAL
  Administered 2023-02-10 – 2023-02-11 (×2): 10 mg via ORAL
  Administered 2023-02-19: 5 mg via ORAL
  Administered 2023-02-20 – 2023-02-21 (×3): 10 mg via ORAL
  Administered 2023-02-25 – 2023-03-01 (×6): 5 mg via ORAL
  Filled 2023-02-08: qty 1
  Filled 2023-02-08: qty 2
  Filled 2023-02-08: qty 1
  Filled 2023-02-08 (×2): qty 2
  Filled 2023-02-08: qty 1
  Filled 2023-02-08: qty 2
  Filled 2023-02-08 (×3): qty 1
  Filled 2023-02-08 (×4): qty 2
  Filled 2023-02-08: qty 1
  Filled 2023-02-08 (×4): qty 2
  Filled 2023-02-08: qty 1
  Filled 2023-02-08: qty 2
  Filled 2023-02-08: qty 1

## 2023-02-08 MED ORDER — EPHEDRINE SULFATE (PRESSORS) 50 MG/ML IJ SOLN
INTRAMUSCULAR | Status: DC | PRN
  Administered 2023-02-08 (×2): 5 mg via INTRAVENOUS

## 2023-02-08 MED ORDER — ASPIRIN 325 MG PO TBEC
325.0000 mg | DELAYED_RELEASE_TABLET | Freq: Every day | ORAL | Status: DC
Start: 2023-02-09 — End: 2023-02-20
  Administered 2023-02-09 – 2023-02-19 (×10): 325 mg via ORAL
  Filled 2023-02-08 (×12): qty 1

## 2023-02-08 MED ORDER — CEFAZOLIN SODIUM-DEXTROSE 2-4 GM/100ML-% IV SOLN
2.0000 g | Freq: Three times a day (TID) | INTRAVENOUS | Status: AC
  Administered 2023-02-08 – 2023-02-10 (×6): 2 g via INTRAVENOUS
  Filled 2023-02-08 (×6): qty 100

## 2023-02-08 MED ORDER — NITROGLYCERIN IN D5W 200-5 MCG/ML-% IV SOLN: 0.0000 ug/min | INTRAVENOUS | Status: DC

## 2023-02-08 MED ORDER — LACTATED RINGERS IV SOLN: INTRAVENOUS | Status: DC | PRN

## 2023-02-08 MED ORDER — ALBUMIN HUMAN 5 % IV SOLN
250.0000 mL | INTRAVENOUS | Status: DC | PRN
  Administered 2023-02-08 (×2): 12.5 g via INTRAVENOUS

## 2023-02-08 MED ORDER — POTASSIUM CHLORIDE 10 MEQ/50ML IV SOLN
10.0000 meq | INTRAVENOUS | Status: AC
  Administered 2023-02-08 (×3): 10 meq via INTRAVENOUS

## 2023-02-08 MED ORDER — PLASMA-LYTE A IV SOLN
INTRAVENOUS | Status: DC
  Filled 2023-02-08: qty 5

## 2023-02-08 MED ORDER — LACTATED RINGERS IV SOLN: INTRAVENOUS | Status: DC

## 2023-02-08 MED ORDER — ALBUMIN HUMAN 5 % IV SOLN: INTRAVENOUS | Status: DC | PRN

## 2023-02-08 MED ORDER — INSULIN REGULAR(HUMAN) IN NACL 100-0.9 UT/100ML-% IV SOLN: INTRAVENOUS | Status: DC

## 2023-02-08 MED ORDER — BISACODYL 10 MG RE SUPP
10.0000 mg | Freq: Every day | RECTAL | Status: DC
Start: 2023-02-09 — End: 2023-02-12

## 2023-02-08 MED ORDER — SODIUM CHLORIDE 0.9 % IV SOLN: INTRAVENOUS | Status: DC

## 2023-02-08 MED ORDER — SODIUM CHLORIDE 0.45 % IV SOLN: INTRAVENOUS | Status: DC | PRN

## 2023-02-08 MED ORDER — HEMOSTATIC AGENTS (NO CHARGE) OPTIME
TOPICAL | Status: DC | PRN
  Administered 2023-02-08: 1 via TOPICAL

## 2023-02-08 MED ORDER — FENTANYL CITRATE (PF) 250 MCG/5ML IJ SOLN
INTRAMUSCULAR | Status: AC
  Filled 2023-02-08: qty 5

## 2023-02-08 MED ORDER — ACETAMINOPHEN 500 MG PO TABS
1000.0000 mg | ORAL_TABLET | Freq: Four times a day (QID) | ORAL | Status: AC
  Administered 2023-02-08 – 2023-02-13 (×19): 1000 mg via ORAL
  Filled 2023-02-08 (×19): qty 2

## 2023-02-08 MED ORDER — 0.9 % SODIUM CHLORIDE (POUR BTL) OPTIME
TOPICAL | Status: DC | PRN
Start: 2023-02-08 — End: 2023-02-08
  Administered 2023-02-08: 5000 mL

## 2023-02-08 MED ORDER — ORAL CARE MOUTH RINSE: 15.0000 mL | Freq: Once | OROMUCOSAL | Status: DC

## 2023-02-08 MED ORDER — DEXMEDETOMIDINE HCL IN NACL 400 MCG/100ML IV SOLN
0.0000 ug/kg/h | INTRAVENOUS | Status: DC
  Administered 2023-02-08: 0.7 ug/kg/h via INTRAVENOUS
  Filled 2023-02-08: qty 100

## 2023-02-08 MED ORDER — AMIODARONE LOAD VIA INFUSION: 150.0000 mg | Freq: Once | INTRAVENOUS | Status: DC

## 2023-02-08 MED ORDER — ROCURONIUM BROMIDE 10 MG/ML (PF) SYRINGE
PREFILLED_SYRINGE | INTRAVENOUS | Status: AC
  Filled 2023-02-08: qty 10

## 2023-02-08 MED ORDER — METOPROLOL TARTRATE 5 MG/5ML IV SOLN
2.5000 mg | INTRAVENOUS | Status: DC | PRN
  Administered 2023-02-18 (×2): 5 mg via INTRAVENOUS
  Filled 2023-02-08 (×3): qty 5

## 2023-02-08 MED ORDER — BISACODYL 5 MG PO TBEC
10.0000 mg | DELAYED_RELEASE_TABLET | Freq: Every day | ORAL | Status: DC
  Administered 2023-02-09 – 2023-02-12 (×4): 10 mg via ORAL
  Filled 2023-02-08 (×4): qty 2

## 2023-02-08 MED ORDER — AMIODARONE HCL IN DEXTROSE 360-4.14 MG/200ML-% IV SOLN
30.0000 mg/h | INTRAVENOUS | Status: DC
  Administered 2023-02-08 – 2023-02-10 (×4): 30 mg/h via INTRAVENOUS
  Filled 2023-02-08 (×5): qty 200

## 2023-02-08 MED ORDER — CHLORHEXIDINE GLUCONATE CLOTH 2 % EX PADS
6.0000 | MEDICATED_PAD | Freq: Every day | CUTANEOUS | Status: DC
  Administered 2023-02-08 – 2023-02-18 (×8): 6 via TOPICAL

## 2023-02-08 MED ORDER — CHLORHEXIDINE GLUCONATE 4 % EX SOLN: 30.0000 mL | CUTANEOUS | Status: DC

## 2023-02-08 MED ORDER — CALCIUM CHLORIDE 10 % IV SOLN
INTRAVENOUS | Status: DC | PRN
  Administered 2023-02-08: 150 mg via INTRAVENOUS
  Administered 2023-02-08 (×2): 100 mg via INTRAVENOUS

## 2023-02-08 MED ORDER — SODIUM BICARBONATE 8.4 % IV SOLN
50.0000 meq | Freq: Once | INTRAVENOUS | Status: AC
  Administered 2023-02-08: 50 meq via INTRAVENOUS

## 2023-02-08 MED ORDER — PHENYLEPHRINE 80 MCG/ML (10ML) SYRINGE FOR IV PUSH (FOR BLOOD PRESSURE SUPPORT)
PREFILLED_SYRINGE | INTRAVENOUS | Status: AC
  Filled 2023-02-08: qty 10

## 2023-02-08 MED ORDER — DOCUSATE SODIUM 100 MG PO CAPS
200.0000 mg | ORAL_CAPSULE | Freq: Every day | ORAL | Status: DC
  Administered 2023-02-09 – 2023-02-12 (×4): 200 mg via ORAL
  Filled 2023-02-08 (×4): qty 2

## 2023-02-08 MED ORDER — ACETAMINOPHEN 160 MG/5ML PO SOLN
650.0000 mg | Freq: Once | ORAL | Status: AC
Start: 2023-02-08 — End: 2023-02-08
  Administered 2023-02-08: 650 mg
  Filled 2023-02-08: qty 20.3

## 2023-02-08 MED ORDER — EPINEPHRINE HCL 5 MG/250ML IV SOLN IN NS: 0.5000 ug/min | INTRAVENOUS | Status: DC

## 2023-02-08 MED ORDER — VANCOMYCIN HCL IN DEXTROSE 1-5 GM/200ML-% IV SOLN
1000.0000 mg | Freq: Once | INTRAVENOUS | Status: AC
  Administered 2023-02-08: 1000 mg via INTRAVENOUS
  Filled 2023-02-08: qty 200

## 2023-02-08 MED ORDER — SODIUM CHLORIDE 0.9 % IV SOLN
250.0000 mL | INTRAVENOUS | Status: AC
  Administered 2023-02-09: 250 mL via INTRAVENOUS

## 2023-02-08 MED ORDER — PHENYLEPHRINE 80 MCG/ML (10ML) SYRINGE FOR IV PUSH (FOR BLOOD PRESSURE SUPPORT)
PREFILLED_SYRINGE | INTRAVENOUS | Status: DC | PRN
  Administered 2023-02-08: 80 ug via INTRAVENOUS
  Administered 2023-02-08: 40 ug via INTRAVENOUS
  Administered 2023-02-08: 80 ug via INTRAVENOUS
  Administered 2023-02-08: 40 ug via INTRAVENOUS
  Administered 2023-02-08: 80 ug via INTRAVENOUS

## 2023-02-08 MED ORDER — PROTAMINE SULFATE 10 MG/ML IV SOLN
INTRAVENOUS | Status: DC | PRN
  Administered 2023-02-08: 270 mg via INTRAVENOUS
  Administered 2023-02-08: 30 mg via INTRAVENOUS

## 2023-02-08 MED ORDER — PANTOPRAZOLE SODIUM 40 MG IV SOLR
40.0000 mg | Freq: Every day | INTRAVENOUS | Status: AC
  Administered 2023-02-08 – 2023-02-09 (×2): 40 mg via INTRAVENOUS
  Filled 2023-02-08 (×2): qty 10

## 2023-02-08 MED ORDER — LIDOCAINE 2% (20 MG/ML) 5 ML SYRINGE
INTRAMUSCULAR | Status: AC
  Filled 2023-02-08: qty 5

## 2023-02-08 MED ORDER — MORPHINE SULFATE (PF) 2 MG/ML IV SOLN
1.0000 mg | INTRAVENOUS | Status: DC | PRN
  Administered 2023-02-09 (×2): 4 mg via INTRAVENOUS
  Filled 2023-02-08 (×2): qty 2

## 2023-02-08 MED ORDER — MIDAZOLAM HCL (PF) 5 MG/ML IJ SOLN
INTRAMUSCULAR | Status: DC | PRN
  Administered 2023-02-08: 2 mg via INTRAVENOUS
  Administered 2023-02-08 (×2): 1 mg via INTRAVENOUS

## 2023-02-08 MED ORDER — PANTOPRAZOLE SODIUM 40 MG PO TBEC
40.0000 mg | DELAYED_RELEASE_TABLET | Freq: Every day | ORAL | Status: DC
  Administered 2023-02-10 – 2023-02-19 (×10): 40 mg via ORAL
  Filled 2023-02-08 (×11): qty 1

## 2023-02-08 MED ORDER — METOPROLOL TARTRATE 25 MG/10 ML ORAL SUSPENSION: 12.5000 mg | Freq: Two times a day (BID) | ORAL | Status: DC

## 2023-02-08 MED ORDER — AMIODARONE HCL IN DEXTROSE 360-4.14 MG/200ML-% IV SOLN
60.0000 mg/h | INTRAVENOUS | Status: DC
  Administered 2023-02-08: 60 mg/h via INTRAVENOUS
  Filled 2023-02-08: qty 200

## 2023-02-08 MED ORDER — AMIODARONE IV BOLUS ONLY 150 MG/100ML
INTRAVENOUS | Status: DC | PRN
  Administered 2023-02-08: 150 mg via INTRAVENOUS

## 2023-02-08 MED ORDER — PHENYLEPHRINE HCL-NACL 20-0.9 MG/250ML-% IV SOLN: 0.0000 ug/min | INTRAVENOUS | Status: DC

## 2023-02-08 MED ORDER — MIDAZOLAM HCL (PF) 10 MG/2ML IJ SOLN
INTRAMUSCULAR | Status: AC
  Filled 2023-02-08: qty 2

## 2023-02-08 MED ORDER — THROMBIN (RECOMBINANT) 20000 UNITS EX SOLR
CUTANEOUS | Status: AC
  Filled 2023-02-08: qty 20000

## 2023-02-08 MED ORDER — CHLORHEXIDINE GLUCONATE 0.12 % MT SOLN: 15.0000 mL | Freq: Once | OROMUCOSAL | Status: DC

## 2023-02-08 MED ORDER — METOPROLOL TARTRATE 12.5 MG HALF TABLET
12.5000 mg | ORAL_TABLET | Freq: Two times a day (BID) | ORAL | Status: DC
  Administered 2023-02-09 – 2023-02-10 (×3): 12.5 mg via ORAL
  Filled 2023-02-08 (×3): qty 1

## 2023-02-08 MED ORDER — ~~LOC~~ CARDIAC SURGERY, PATIENT & FAMILY EDUCATION
Freq: Once | Status: DC
  Filled 2023-02-08: qty 1

## 2023-02-08 MED ORDER — EPHEDRINE SULFATE-NACL 50-0.9 MG/10ML-% IV SOSY
PREFILLED_SYRINGE | INTRAVENOUS | Status: DC | PRN
  Administered 2023-02-08: 30 mg via INTRAVENOUS

## 2023-02-08 MED ORDER — ASPIRIN 81 MG PO CHEW
324.0000 mg | CHEWABLE_TABLET | Freq: Every day | ORAL | Status: DC
Start: 2023-02-09 — End: 2023-02-20
  Administered 2023-02-18: 324 mg
  Filled 2023-02-08 (×2): qty 4

## 2023-02-08 MED ORDER — DEXTROSE 50 % IV SOLN: 0.0000 mL | INTRAVENOUS | Status: DC | PRN

## 2023-02-08 MED ORDER — CLEVIDIPINE BUTYRATE 0.5 MG/ML IV EMUL
INTRAVENOUS | Status: AC
  Filled 2023-02-08: qty 50

## 2023-02-08 MED ORDER — CHLORHEXIDINE GLUCONATE 0.12 % MT SOLN
15.0000 mL | OROMUCOSAL | Status: AC
  Administered 2023-02-08: 15 mL via OROMUCOSAL
  Filled 2023-02-08: qty 15

## 2023-02-08 MED ORDER — TRAMADOL HCL 50 MG PO TABS
50.0000 mg | ORAL_TABLET | ORAL | Status: DC | PRN
  Administered 2023-02-09 – 2023-02-21 (×13): 100 mg via ORAL
  Administered 2023-02-21: 50 mg via ORAL
  Filled 2023-02-08 (×5): qty 2
  Filled 2023-02-08 (×2): qty 1
  Filled 2023-02-08 (×8): qty 2

## 2023-02-08 MED ORDER — EPHEDRINE 5 MG/ML INJ
INTRAVENOUS | Status: AC
  Filled 2023-02-08: qty 5

## 2023-02-08 MED ORDER — PROTAMINE SULFATE 10 MG/ML IV SOLN
INTRAVENOUS | Status: AC
  Filled 2023-02-08: qty 25

## 2023-02-08 MED ORDER — METOCLOPRAMIDE HCL 5 MG/ML IJ SOLN
10.0000 mg | Freq: Four times a day (QID) | INTRAMUSCULAR | Status: AC
  Administered 2023-02-08 – 2023-02-09 (×5): 10 mg via INTRAVENOUS
  Filled 2023-02-08 (×5): qty 2

## 2023-02-08 MED ORDER — HEPARIN SODIUM (PORCINE) 1000 UNIT/ML IJ SOLN
INTRAMUSCULAR | Status: DC | PRN
  Administered 2023-02-08: 34000 [IU] via INTRAVENOUS

## 2023-02-08 MED ORDER — PROPOFOL 10 MG/ML IV BOLUS
INTRAVENOUS | Status: DC | PRN
  Administered 2023-02-08: 60 mg via INTRAVENOUS
  Administered 2023-02-08: 10 mg via INTRAVENOUS
  Administered 2023-02-08: 50 mg via INTRAVENOUS
  Administered 2023-02-08: 40 mg via INTRAVENOUS
  Administered 2023-02-08: 20 mg via INTRAVENOUS
  Administered 2023-02-08: 30 mg via INTRAVENOUS

## 2023-02-08 MED ORDER — MAGNESIUM SULFATE 4 GM/100ML IV SOLN
4.0000 g | Freq: Once | INTRAVENOUS | Status: AC
  Administered 2023-02-08: 4 g via INTRAVENOUS
  Filled 2023-02-08: qty 100

## 2023-02-08 MED ORDER — MIDAZOLAM HCL 2 MG/2ML IJ SOLN: 2.0000 mg | INTRAMUSCULAR | Status: DC | PRN

## 2023-02-08 MED ORDER — CHLORHEXIDINE GLUCONATE 0.12 % MT SOLN
15.0000 mL | Freq: Once | OROMUCOSAL | Status: DC
  Filled 2023-02-08: qty 15

## 2023-02-08 MED ORDER — HEPARIN SODIUM (PORCINE) 1000 UNIT/ML IJ SOLN
INTRAMUSCULAR | Status: AC
  Filled 2023-02-08: qty 20

## 2023-02-08 MED ORDER — ACETAMINOPHEN 160 MG/5ML PO SOLN: 1000.0000 mg | Freq: Four times a day (QID) | ORAL | Status: AC

## 2023-02-08 MED ORDER — PROTAMINE SULFATE 10 MG/ML IV SOLN
INTRAVENOUS | Status: AC
  Filled 2023-02-08: qty 5

## 2023-02-08 SURGICAL SUPPLY — 74 items
ADAPTER CARDIO PERF ANTE/RETRO (ADAPTER) ×2 IMPLANT
BAG DECANTER FOR FLEXI CONT (MISCELLANEOUS) ×2 IMPLANT
BLADE CLIPPER SURG (BLADE) ×2 IMPLANT
BLADE STERNUM SYSTEM 6 (BLADE) ×2 IMPLANT
BLADE SURG 15 STRL LF DISP TIS (BLADE) ×2 IMPLANT
CANISTER SUCT 3000ML PPV (MISCELLANEOUS) ×2 IMPLANT
CANNULA ARTERIAL NVNT 3/8 22FR (MISCELLANEOUS) IMPLANT
CANNULA GUNDRY RCSP 15FR (MISCELLANEOUS) ×2 IMPLANT
CANNULA MC2 2 STG 36/46 CONN (CANNULA) IMPLANT
CATH HEART VENT LEFT (CATHETERS) ×2 IMPLANT
CATH ROBINSON RED A/P 18FR (CATHETERS) ×6 IMPLANT
CATH THORACIC 36FR (CATHETERS) ×2 IMPLANT
CATH THORACIC 36FR RT ANG (CATHETERS) ×2 IMPLANT
CNTNR URN SCR LID CUP LEK RST (MISCELLANEOUS) ×2 IMPLANT
CONTAINER PROTECT SURGISLUSH (MISCELLANEOUS) ×4 IMPLANT
COVER SURGICAL LIGHT HANDLE (MISCELLANEOUS) ×2 IMPLANT
DEVICE SUT CK QUICK LOAD MINI (Prosthesis & Implant Heart) IMPLANT
DRAPE SRG 135X102X78XABS (DRAPES) ×2 IMPLANT
DRAPE WARM FLUID 44X44 (DRAPES) ×2 IMPLANT
DRSG COVADERM 4X14 (GAUZE/BANDAGES/DRESSINGS) ×2 IMPLANT
ELECT CAUTERY BLADE 6.4 (BLADE) ×2 IMPLANT
ELECT REM PT RETURN 9FT ADLT (ELECTROSURGICAL) ×4
ELECTRODE REM PT RTRN 9FT ADLT (ELECTROSURGICAL) ×4 IMPLANT
FELT TEFLON 1X6 (MISCELLANEOUS) ×4 IMPLANT
GAUZE 4X4 16PLY ~~LOC~~+RFID DBL (SPONGE) ×2 IMPLANT
GAUZE SPONGE 4X4 12PLY STRL (GAUZE/BANDAGES/DRESSINGS) ×2 IMPLANT
GAUZE SPONGE 4X4 12PLY STRL LF (GAUZE/BANDAGES/DRESSINGS) IMPLANT
GLOVE BIO SURGEON STRL SZ 6 (GLOVE) IMPLANT
GLOVE BIO SURGEON STRL SZ 6.5 (GLOVE) IMPLANT
GLOVE BIO SURGEON STRL SZ7 (GLOVE) IMPLANT
GLOVE BIO SURGEON STRL SZ7.5 (GLOVE) IMPLANT
GLOVE SURG MICRO LTX SZ7 (GLOVE) ×4 IMPLANT
GOWN STRL REUS W/ TWL LRG LVL3 (GOWN DISPOSABLE) ×8 IMPLANT
GOWN STRL REUS W/ TWL XL LVL3 (GOWN DISPOSABLE) ×2 IMPLANT
HEMOSTAT POWDER SURGIFOAM 1G (HEMOSTASIS) ×6 IMPLANT
HEMOSTAT SURGICEL 2X14 (HEMOSTASIS) ×2 IMPLANT
INSERT FOGARTY XLG (MISCELLANEOUS) IMPLANT
KIT BASIN OR (CUSTOM PROCEDURE TRAY) ×2 IMPLANT
KIT CATH CPB BARTLE (MISCELLANEOUS) ×2 IMPLANT
KIT SUCTION CATH 14FR (SUCTIONS) ×2 IMPLANT
KIT SUT CK MINI COMBO 4X17 (Prosthesis & Implant Heart) IMPLANT
KIT TURNOVER KIT B (KITS) ×2 IMPLANT
LEAD PACING MYOCARDI (MISCELLANEOUS) IMPLANT
LINE VENT (MISCELLANEOUS) IMPLANT
NS IRRIG 1000ML POUR BTL (IV SOLUTION) ×12 IMPLANT
PACK E OPEN HEART (SUTURE) ×2 IMPLANT
PACK OPEN HEART (CUSTOM PROCEDURE TRAY) ×2 IMPLANT
PAD ARMBOARD 7.5X6 YLW CONV (MISCELLANEOUS) ×4 IMPLANT
POSITIONER HEAD DONUT 9IN (MISCELLANEOUS) ×2 IMPLANT
SET MPS 3-ND DEL (MISCELLANEOUS) IMPLANT
SPONGE T-LAP 18X18 ~~LOC~~+RFID (SPONGE) ×8 IMPLANT
SPONGE T-LAP 4X18 ~~LOC~~+RFID (SPONGE) ×2 IMPLANT
SUT BONE WAX W31G (SUTURE) ×2 IMPLANT
SUT EB EXC GRN/WHT 2-0 V-5 (SUTURE) ×4 IMPLANT
SUT ETHIBON EXCEL 2-0 V-5 (SUTURE) IMPLANT
SUT ETHIBOND V-5 VALVE (SUTURE) IMPLANT
SUT PROLENE 3 0 SH DA (SUTURE) IMPLANT
SUT PROLENE 3 0 SH1 36 (SUTURE) ×2 IMPLANT
SUT PROLENE 4-0 RB1 .5 CRCL 36 (SUTURE) ×6 IMPLANT
SUT SILK 2 0 SH CR/8 (SUTURE) IMPLANT
SUT STEEL 6MS V (SUTURE) IMPLANT
SUT STEEL STERNAL CCS#1 18IN (SUTURE) IMPLANT
SUT STEEL SZ 6 DBL 3X14 BALL (SUTURE) IMPLANT
SUT VIC AB 1 CTX36XBRD ANBCTR (SUTURE) ×4 IMPLANT
SYSTEM SAHARA CHEST DRAIN ATS (WOUND CARE) ×2 IMPLANT
TAPE CLOTH SURG 4X10 WHT LF (GAUZE/BANDAGES/DRESSINGS) IMPLANT
TAPE PAPER 2X10 WHT MICROPORE (GAUZE/BANDAGES/DRESSINGS) IMPLANT
TOWEL GREEN STERILE (TOWEL DISPOSABLE) ×2 IMPLANT
TOWEL GREEN STERILE FF (TOWEL DISPOSABLE) ×2 IMPLANT
TRAY FOLEY SLVR 16FR TEMP STAT (SET/KITS/TRAYS/PACK) ×2 IMPLANT
UNDERPAD 30X36 HEAVY ABSORB (UNDERPADS AND DIAPERS) ×2 IMPLANT
VALVE AORTIC SZ25 INSP/RESIL (Prosthesis & Implant Heart) IMPLANT
VENT LEFT HEART 12002 (CATHETERS) ×2
WATER STERILE IRR 1000ML POUR (IV SOLUTION) ×4 IMPLANT

## 2023-02-08 NOTE — Anesthesia Procedure Notes (Signed)
 Central Venous Catheter Insertion Performed by: Leopoldo Bruckner, MD, anesthesiologist Start/End1/04/2023 6:56 AM, 02/08/2023 7:11 AM Patient location: Pre-op. Preanesthetic checklist: patient identified, IV checked, site marked, risks and benefits discussed, surgical consent, monitors and equipment checked, pre-op evaluation, timeout performed and anesthesia consent Lidocaine  1% used for infiltration and patient sedated Hand hygiene performed  and maximum sterile barriers used  Catheter size: 8.5 Fr Sheath introducer Procedure performed using ultrasound guided technique. Ultrasound Notes:anatomy identified, needle tip was noted to be adjacent to the nerve/plexus identified, no ultrasound evidence of intravascular and/or intraneural injection and image(s) printed for medical record Attempts: 1 Following insertion, line sutured and dressing applied. Post procedure assessment: blood return through all ports, free fluid flow and no air  Patient tolerated the procedure well with no immediate complications.

## 2023-02-08 NOTE — Anesthesia Procedure Notes (Addendum)
 Procedure Name: Intubation Date/Time: 02/08/2023 7:54 AM  Performed by: Jerl Donald LABOR, CRNAPre-anesthesia Checklist: Patient identified, Emergency Drugs available, Suction available and Patient being monitored Patient Re-evaluated:Patient Re-evaluated prior to induction Oxygen Delivery Method: Circle System Utilized Preoxygenation: Pre-oxygenation with 100% oxygen Induction Type: IV induction Ventilation: Oral airway inserted - appropriate to patient size and Two handed mask ventilation required Laryngoscope Size: Glidescope and 3 Grade View: Grade II Tube type: Oral Tube size: 8.0 mm Number of attempts: 1 Airway Equipment and Method: Oral airway, Video-laryngoscopy and Rigid stylet Placement Confirmation: ETT inserted through vocal cords under direct vision, positive ETCO2 and breath sounds checked- equal and bilateral Secured at: 24 (at lip) cm Tube secured with: Tape Dental Injury: Teeth and Oropharynx as per pre-operative assessment  Comments: X1 attempt with mac 4 blade, grade 3 view; X1 attempt with Miller 2, grade 2a view, unable to pass tube; patient stable throughout.

## 2023-02-08 NOTE — Brief Op Note (Signed)
 02/08/2023  2:18 PM  PATIENT:  Jerry [name]  58 y.o. male  PRE-OPERATIVE DIAGNOSIS:  SEVERE AORTIC STENOSIS  POST-OPERATIVE DIAGNOSIS:  SEVERE AORTIC STENOSIS  PROCEDURE:  Procedure(s): AORTIC VALVE REPLACEMENT (AVR) USING INSPIRIS AORTIC VALVE SIZE (N/A) TRANSESOPHAGEAL ECHOCARDIOGRAM (N/A)  SURGEON:  Surgeons and Role:    * Lucas Dorise POUR, MD - Primary  PHYSICIAN ASSISTANT: Jomes Giraldo PA-C  ASSISTANTS: LUKE MAYOTTE RNFA   ANESTHESIA:   general  EBL: 1588 ml  BLOOD ADMINISTERED:none  DRAINS:  MEDIASTINAL CHEST TUBES    LOCAL MEDICATIONS USED:  NONE  SPECIMEN:  Source of Specimen:  AORTIC VALVE LEAFLETS  DISPOSITION OF SPECIMEN:  PATHOLOGY  COUNTS:  YES  TOURNIQUET:  * No tourniquets in log *  DICTATION: .Dragon Dictation  PLAN OF CARE: Admit to inpatient   PATIENT DISPOSITION:  ICU - intubated and hemodynamically stable.   Delay start of Pharmacological VTE agent (>24hrs) due to surgical blood loss or risk of bleeding: yes  COMPLICATIONS: NO KNOWN

## 2023-02-08 NOTE — Transfer of Care (Signed)
 Immediate Anesthesia Transfer of Care Note  Patient: Jerry [name]  Procedure(s) Performed: AORTIC VALVE REPLACEMENT (AVR) USING INSPIRIS AORTIC VALVE SIZE (Chest) TRANSESOPHAGEAL ECHOCARDIOGRAM  Patient Location: ICU  Anesthesia Type:General  Level of Consciousness: sedated and Patient remains intubated per anesthesia plan  Airway & Oxygen Therapy: Patient remains intubated per anesthesia plan and Patient placed on Ventilator (see vital sign flow sheet for setting)  Post-op Assessment: Report given to RN and Post -op Vital signs reviewed and stable  Post vital signs: Reviewed and stable  Last Vitals:  Vitals Value Taken Time  BP 100/56 02/08/23   1259  Temp 37.5 C 02/08/23 1259  Pulse 80 02/08/23 1259  Resp 16 02/08/23 1259  SpO2 96 % 02/08/23 1259  Vitals shown include unfiled device data.  Last Pain:  Vitals:   02/08/23 0540  TempSrc: Oral         Complications:  Encounter Notable Events  Notable Event Outcome Phase Comment  Difficult to intubate - unexpected  Intraprocedure Filed from anesthesia note documentation.

## 2023-02-08 NOTE — Interval H&P Note (Signed)
 History and Physical Interval Note:  02/08/2023 6:30 AM  Jerry [name]  has presented today for surgery, with the diagnosis of SEVERE AS.  The various methods of treatment have been discussed with the patient and family. After consideration of risks, benefits and other options for treatment, the patient has consented to  Procedure(s): AORTIC VALVE REPLACEMENT (AVR) (N/A) TRANSESOPHAGEAL ECHOCARDIOGRAM (N/A) as a surgical intervention.  The patient's history has been reviewed, patient examined, no change in status, stable for surgery.  I have reviewed the patient's chart and labs.  Questions were answered to the patient's satisfaction.     Kaesen Rodriguez K Bobetta Korf

## 2023-02-08 NOTE — Anesthesia Procedure Notes (Addendum)
 Arterial Line Insertion Start/End1/04/2023 6:45 AM, 02/08/2023 6:47 AM Performed by: Leopoldo Bruckner, MD, Jerl Donald LABOR, CRNA, CRNA  Patient location: Pre-op. Preanesthetic checklist: patient identified, IV checked, site marked, risks and benefits discussed, surgical consent, monitors and equipment checked, pre-op evaluation, timeout performed and anesthesia consent Lidocaine  1% used for infiltration radial was placed Catheter size: 20 G Hand hygiene performed   Attempts: 1 Procedure performed without using ultrasound guided technique. Following insertion, dressing applied and Biopatch. Post procedure assessment: normal  Patient tolerated the procedure well with no immediate complications.

## 2023-02-08 NOTE — Anesthesia Procedure Notes (Signed)
 Central Venous Catheter Insertion Performed by: Leopoldo Bruckner, MD, anesthesiologist Start/End1/04/2023 6:56 AM, 02/08/2023 7:11 AM Patient location: Pre-op. Preanesthetic checklist: patient identified, IV checked, site marked, risks and benefits discussed, surgical consent, monitors and equipment checked, pre-op evaluation, timeout performed and anesthesia consent Hand hygiene performed  and maximum sterile barriers used  PA cath was placed.Swan type:thermodilution Procedure performed without using ultrasound guided technique. Attempts: 1 Patient tolerated the procedure well with no immediate complications.

## 2023-02-08 NOTE — Procedures (Signed)
 Extubation Procedure Note  Patient Details:   Name: MACKLIN JACQUIN DOB: 07-10-1965 MRN: [id number]   Airway Documentation:    Vent end date: 02/08/23 Vent end time: 1658   Evaluation  O2 sats: stable throughout Complications: No apparent complications Patient did tolerate procedure well. Bilateral Breath Sounds: Clear   Yes  Patient was extubated to a 6L [place] without any complications, dyspnea or stridor noted. NIF: -35, VC: 800, positive cuff leak prior to extubation.   Malachy Rosina CROME 02/08/2023, 4:58 PM

## 2023-02-08 NOTE — Discharge Summary (Signed)
 [address]       [place] [address]             [phone]    Physician Discharge Summary  Patient ID: Jerry [name] MRN: [id number] DOB/AGE: 1965/11/03 58 y.o.  Admit date: 02/08/2023 Discharge date: 03/05/2023  Admission Diagnoses:  Patient Active Problem List   Diagnosis Date Noted   S/P AVR 02/20/2023   S/P aortic valve replacement with bioprosthetic valve 02/08/2023   ERRONEOUS ENCOUNTER--DISREGARD 01/11/2023   Severe aortic stenosis 12/10/2022   History of craniotomy 11/05/2022   Partial symptomatic epilepsy with complex partial seizures, not intractable, without status epilepticus (HCC) 11/05/2022   Aortic valve stenosis, severe 07/06/2022   Bicuspid aortic valve 07/06/2022   Benign essential microscopic hematuria 06/06/2016   HLD (hyperlipidemia) 06/27/2015   Venous stasis 01/21/2015   Seizure disorder (HCC) 10/07/2013   Head injury      Discharge Diagnoses:  Patient Active Problem List   Diagnosis Date Noted   S/P aortic valve replacement with bioprosthetic valve 02/08/2023   Perioperative atrial fibrillation 01/11/2023   Severe aortic stenosis 12/10/2022   History of craniotomy 11/05/2022   Partial symptomatic epilepsy with complex partial seizures, not intractable, without status epilepticus (HCC) 11/05/2022   Aortic valve stenosis, severe 07/06/2022   Bicuspid aortic valve 07/06/2022   Benign essential microscopic hematuria 06/06/2016   HLD (hyperlipidemia) 06/27/2015   Venous stasis 01/21/2015   Seizure disorder (HCC) 10/07/2013   Head injury       Discharged Condition: stable  Referring Physician: Dr. 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  He has a history of bicuspid aortic stenosis that has been followed with echocardiogram.  A 2D echocardiogram in May 2022 showed a mean gradient of 38.2 mmHg and a peak of 69 mmHg with a valve area of 0.88 cm consistent with severe aortic stenosis.  He was asymptomatic and continued follow-up was recommended.  A follow-up echocardiogram on 08/24/2022 showed a mean gradient of 32 mmHg with a V-max of 4.02 m/s and a dimensionless index of 0.23.  Left ventricular ejection fraction is 60 to 65% with mild LVH.  There is grade 1 diastolic dysfunction.  A gated cardiac CTA on 10/26/2022 showed a bicuspid aortic valve with fusion of the right and noncoronary cusps.  There was severe leaflet calcification with restricted leaflet motion.  Aortic valve calcium  score was 3330.  There was felt to be minimal coronary disease in the LAD and RCA.  He underwent an exercise treadmill test on 11/02/2022 which showed evidence of ischemia during stress.  The test was stopped due to shortness of breath and leg discomfort.  Cardiac catheterization was recommended and that was done today showing mild nonobstructive coronary disease.  Right heart pressures were within normal limits.   The patient is single and has no siblings.  His parents are deceased.  There is no family history of aortic valve disease, aneurysm, or aortic dissection.  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  He does not exercise at all.  He denies any exertional shortness of breath or fatigue.  He denies any chest discomfort.  He has had no dizziness or syncope.  He denies peripheral edema and orthopnea.  Following review of the patient and all relevant studies it was Dr. Jeralyn recommendation to proceed with aortic valve replacement.  The patient was admitted this hospitalization  electively for the procedure.  Hospital Course:  On 02/08/2023 the patient was admitted and taken to the OR at which time he underwent aortic valve replacement with a #25 Inspiris Resilia bioprosthetic. He had and episode of atrial fibrillation in the OR and was started on amiodarone  loading IV.  The patient tolerated the procedure well was taken to the surgical intensive care unit in stable condition. He was extubated the afternoon of surgery. Norva Purl, a line, chest tubes and foley were all removed early in his post operative course. He was started on Lopressor . He had no further atrial fibrillation while in the ICU.  He was above his pre op weight and was diuresed accordingly. He has expected post op blood loss anemia and mild thrombocytopenia. He was transitioned off the Insulin  drip. His pre op HGA1C was 5. Accu checks and SS PRN will be stopped upon transfer.  He progressed slowly with mobility.  He was evaluated by OT and PT who recommended a short term stay at a SNF at after discharge for further strengthening and rehab.  Mr. Swamy was initially reluctant to agree to this plan but admitted that he had no one who could help him with care at home so he decided to pursue SNF placement.  He did develop some further atrial fibrillation as well as bradycardia.  The EP team was consulted to assist with management of the antiarrhythmic medications. Bisoprolol  was added to improve heart rate control. A follow up echocardiogram was obtained on 02/19/23 showing a large pericardial effusion with diastolic collapse of the right ventricle. Mr. Brashier was returned to the OR on the following day for subxiphoid drainage of the effusion with blood fluid drained from the pericardial space.  Additionally, a left chest tube was placed to drain a left pleural effusion. Following these procedures, he had immediate increase in his blood pressure and decrease in his heart rate.   He was transferred to the ICU immediately  following the subxiphoid pericardial drainage procedure.  Renal function gradually improved following pericardial drainage.  Mr. Langham remained stable in the ICU.  The mediastinal lymph pleural tubes were removed on the second day following the pericardial window.  Mr. Depaula was mobilized and was ready for transfer back to [facility].  He remained in a stable sinus rhythm on oral amiodarone  and metoprolol .  He was not anticoagulated due to the risk of recurrent pericardial effusion.  He did have recurrent episode of atrial fibrillation on 02/22/2022 and was given a bolus of IV amiodarone  150 mg.  He had converted back to sinus rhythm within a few hours.  He was diuresed for persistent edema.  We resumed discharge planning for transition to skilled nursing facility once he had recovered from the  pericardial drainage procedure. He had a bed offer but we were unable to secure insurance authorization for the facility. We continued to provide physical therapy as able while he was in the hospital since he was not yet able to care for himself independently at home.  He was evaluated by the inpatiet rehab team and was felt to be an appropriate candidate for admission to  that facility.   A request for insurance authorization for CIR was made on 03/04/23 and approved. A bed became available on CIR the following day and Mr. Altice was felt to be medially stable for transition to that facility.  He was well below his admission weight and had no peripheral edema.  He was maintaining satisfactory oxygenation on room air.  Was able to walk in the hall with the assistance of the therapist and using a rolling walker.  His incisions are intact and healing no sign of complication.  All sutures were removed prior to his discharge. If Mr. Diodato remains in rehab at the time of his follow-up appointment, please call our office to reschedule.   Consults: None  Significant Diagnostic Studies:   CLINICAL DATA:  [id number],  postoperative check following aortic valvular replacement January 3.   EXAM: CHEST - 2 VIEW   02/14/2023   COMPARISON:  Portable chest 02/10/2018   FINDINGS: There are intact and midline sternotomy sutures, and a prosthetic aortic valve. The cardiac size is normal.   No vascular congestion is seen. The lungs clear with resolution of left perihilar linear atelectasis noted previously.   There is mild chronic elevation of the right hemidiaphragm. The sulci are sharp. The mediastinum is normally outlined.   No new osseous findings. Extensive thoracic spine bridging enthesopathy of DISH.   IMPRESSION: 1. No evidence of acute chest disease 2. Resolution of left perihilar linear atelectasis noted previously. 3. Status post aortic valve replacement.     Electronically Signed   By: Francis Quam M.D.   On: 02/14/2023 07:53   Treatments: Surgery  CARDIOVASCULAR SURGERY OPERATIVE NOTE   02/08/2023 Vinie GORMAN Gladis [id number]   Surgeon:  Dorise LOIS Fellers, MD   First Assistant: Lemond Cera,  PA-C: An experienced assistant was required given the complexity of this surgery and the standard of surgical care. The assistant was needed for exposure, dissection, suctioning, retraction of delicate tissues and sutures, instrument exchange and for overall help during this procedure.      Preoperative Diagnosis:  Severe bicuspid aortic valve stenosis     Postoperative Diagnosis:  Same     Procedure:   Median Sternotomy Extracorporeal circulation 3.   Aortic valve replacement using a 25 mm  Edwards INSPIRIS RESILIA pericardial valve.   Anesthesia:  General Endotracheal     Discharge Exam: Blood pressure 117/71, pulse 70, temperature 98.2 F (36.8 C), temperature source Oral, resp. rate (!) 23, height 5' 11 (1.803 m), weight 94 kg, SpO2 98%.  General appearance: alert and cooperative Neurologic: intact Heart: regular rate and rhythm Lungs: clear to auscultation  bilaterally Abdomen: soft, non-tender; bowel sounds normal Extremities: no edema Wound: incision healing well   Discharge Medications:  The patient has been discharged on:   1.Beta Blocker:  Yes [ x  ]                              No   [   ]                              If No, reason:  2.Ace Inhibitor/ARB: Yes [   ]                                     No  [  x  ]                                     If No, reason: Not indicated  3.Statin:   Yes [  x ]                  No  [   ]                  If No, reason:  4.Ecasa:  Yes  [ x ]                  No   [   ]                  If No, reason:  Patient had ACS upon admission: N  Plavix/P2Y12 inhibitor: Yes [   ]                                      No  [ x ]     Discharge Instructions     Amb Referral to Cardiac Rehabilitation   Complete by: As directed    Diagnosis: Valve Replacement   Valve: Aortic   After initial evaluation and assessments completed: Virtual Based Care may be provided alone or in conjunction with Phase 2 Cardiac Rehab based on patient barriers.: Yes   Intensive Cardiac Rehabilitation (ICR) [facility] location only OR Traditional Cardiac Rehabilitation (TCR) *If criteria for ICR are not met will enroll in TCR ([facility] only): Yes      Allergies as of 03/05/2023   No Known Allergies      Medication List     STOP taking these medications    amLODipine  10 MG tablet Commonly known as: NORVASC        TAKE these medications    amiodarone  200 MG tablet Commonly known as: PACERONE  Take 1 tablet (200 mg total) by mouth daily.   aspirin  EC 325 MG tablet Take 1 tablet (325 mg total) by mouth daily.   divalproex  500 MG 24 hr tablet Commonly known as: DEPAKOTE  ER TAKE 3 TABLETS BY MOUTH IN THE MORNING AND 3 IN THE EVENING   Fe Fum-Vit C-Vit B12-FA Caps capsule Commonly known as: TRIGELS-F FORTE Take 1 capsule by mouth 2 (two) times daily.   metoprolol  tartrate 25 MG tablet Commonly known as:  LOPRESSOR  Take 0.5 tablets (12.5 mg total) by mouth 2 (two) times daily.   phenytoin  100 MG ER capsule Commonly known as: DILANTIN  Take 2 capsules (200 mg total) by mouth at bedtime.   rosuvastatin  10 MG tablet Commonly known as: CRESTOR  Take 10 mg by mouth every evening.   senna-docusate 8.6-50 MG tablet Commonly known as: Senokot-S Take 1 tablet by mouth daily. Start taking on: March 06, 2023   traMADol  50 MG tablet Commonly known as: ULTRAM  Take 1 tablet (50 mg total) by mouth every 6 (six) hours as needed for moderate pain (pain score 4-6).   Vitamin D -3 25 MCG (1000 UT) Caps Take 1,000 Units by mouth in the morning.        Follow-up Information     Lucas Dorise POUR, MD. Go on 03/13/2023.   Specialty: Cardiothoracic Surgery Why: Your appointment is at 12:30pm.  Please obtain an chest x-ray 1 hour before the appointment at [facility] located at [address] Contact information: [address] [employer] [address] [place] [address] [phone]         [facility]. Go on 03/13/2023.   Why: On the date you are scheduled to see Dr. Lucas obtain a chest x-ray at [facility] 1 hour prior to the appointment. Contact information: [address] [place]  [address]        Miriam Norris, NP. Go on 03/12/2023.   Specialty: Cardiology Why: Your appointment is at 8:30am. Contact information: [address] [place] [address] [phone]         [facility] [place]. Go on 03/20/2023.   Specialty: Cardiology Why: Your follow up Echocardiogram is at 10:30am. Contact information: [address] [employer] [place]  [address] [phone]                Signed:  [name], PA-C  03/05/2023, 2:09 PM

## 2023-02-08 NOTE — Hospital Course (Addendum)
 Referring Physician: Dr. 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  He has a history of bicuspid aortic stenosis that has been followed with echocardiogram.  A 2D echocardiogram in May 2022 showed a mean gradient of 38.2 mmHg and a peak of 69 mmHg with a valve area of 0.88 cm consistent with severe aortic stenosis.  He was asymptomatic and continued follow-up was recommended.  A follow-up echocardiogram on 08/24/2022 showed a mean gradient of 32 mmHg with a V-max of 4.02 m/s and a dimensionless index of 0.23.  Left ventricular ejection fraction is 60 to 65% with mild LVH.  There is grade 1 diastolic dysfunction.  A gated cardiac CTA on 10/26/2022 showed a bicuspid aortic valve with fusion of the right and noncoronary cusps.  There was severe leaflet calcification with restricted leaflet motion.  Aortic valve calcium  score was 3330.  There was felt to be minimal coronary disease in the LAD and RCA.  He underwent an exercise treadmill test on 11/02/2022 which showed evidence of ischemia during stress.  The test was stopped due to shortness of breath and leg discomfort.  Cardiac catheterization was recommended and that was done today showing mild nonobstructive coronary disease.  Right heart pressures were within normal limits.   The patient is single and has no siblings.  His parents are deceased.  There is no family history of aortic valve disease, aneurysm, or aortic dissection.  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  He does not exercise at all.  He  denies any exertional shortness of breath or fatigue.  He denies any chest discomfort.  He has had no dizziness or syncope.  He denies peripheral edema and orthopnea.  Following review of the patient and all relevant studies it was Dr. Jeralyn recommendation to proceed with aortic valve replacement.  The patient was admitted this hospitalization electively for the procedure.  Hospital Course:  On 02/08/2023 the patient was admitted and taken to the OR at which time he underwent aortic valve replacement with a #25 Inspiris Resilia bioprosthetic. He had and episode of atrial fibrillation in the OR and was started on amiodarone  loading IV.  The patient tolerated the procedure well was taken to the surgical intensive care unit in stable condition. He was extubated the afternoon of surgery. Norva Purl, a line, chest tubes and foley were all removed early in his post operative course. He was started on Lopressor . He had no further atrial fibrillation while in the ICU.  He was above his pre op weight and was diuresed accordingly. He has expected post op blood loss anemia and mild thrombocytopenia. He was transitioned off the Insulin  drip. His pre op HGA1C was 5. Accu checks and SS PRN will be stopped upon transfer.  He progressed slowly with mobility.  He was evaluated by OT and PT who recommended a short term stay at a SNF at after discharge for further strengthening and rehab. Mr. Fauth was reluctant to agree to this plan but admitted that he had no one who could help him with care at home so  he decided to pursue SNF placement.  He did develop some further atrial fibrillation as well as bradycardia.  The EP team was consulted to assist with management of the antiarrhythmic medications. Bisoprolol  was added to improve heart rate control. A follow up echocardiogram was obtained on 02/19/23 showing a large pericardial effusion with diastolic collapse of the right ventricle. Mr. Ishler was returned to the OR on the  following day for subxiphoid drainage of the effusion with blood fluid drained from the pericardial space.  Additionally, a left chest tube was placed to drain a left pleural effusion. Following these procedures, he had immediate increase in his blood pressure and decrease in his heart rate.   He was transferred to the ICU immediately following the subxiphoid pericardial drainage procedure.  Renal function gradually improved following pericardial drainage.  Mr. Mcfarlane remained stable in the ICU.  The mediastinal lymph pleural tubes were removed on the second day following the pericardial window.  Mr. Marland was mobilized and was ready for transfer back to [facility].  He remained in a stable sinus rhythm on oral amiodarone  and metoprolol .  He was not anticoagulated due to the risk of recurrent pericardial effusion.  He did have recurrent episode of atrial fibrillation on 02/22/2022 and was given a bolus of IV amiodarone  150 mg.  He had converted back to sinus rhythm within a few hours.  He was diuresed for persistent edema.  We resumed discharge planning for transition to skilled nursing facility. He remained in a stable sinus and his incisions continued to heal with no sign of complication. His insurance was denied by a SNF.  However is actively participating with PT/OT and making good progress.  He would greatly benefit from more intensive therapy program, and inpatient rehab consult was obtained.  They felt patient would be an appropriate candidate.

## 2023-02-08 NOTE — Progress Notes (Signed)
  Echocardiogram Echocardiogram Transesophageal has been performed.  Jerry [name] 02/08/2023, 11:57 AM

## 2023-02-08 NOTE — Anesthesia Preprocedure Evaluation (Signed)
 Anesthesia Evaluation  Patient identified by MRN, date of birth, ID band Patient awake    Reviewed: Allergy & Precautions, NPO status , Patient's Chart, lab work & pertinent test results  History of Anesthesia Complications Negative for: history of anesthetic complications  Airway Mallampati: III  TM Distance: <3 FB Neck ROM: Full    Dental  (+) Teeth Intact, Dental Advisory Given   Pulmonary former smoker   breath sounds clear to auscultation       Cardiovascular hypertension, Pt. on medications (-) angina (-) Past MI and (-) CHF + Valvular Problems/Murmurs  Rhythm:Regular + Systolic murmurs    Neuro/Psych Seizures -, Well Controlled,  Previous crani for ? Bacterial meningitis and seizures in 1984  negative psych ROS   GI/Hepatic negative GI ROS, Neg liver ROS,,,  Endo/Other  negative endocrine ROS    Renal/GU negative Renal ROSLab Results      Component                Value               Date                      NA                       141                 02/08/2023                K                        3.8                 02/08/2023                CO2                      27                  02/07/2023                GLUCOSE                  133 (H)             02/08/2023                BUN                      13                  02/08/2023                CREATININE               1.40 (H)            02/08/2023                CALCIUM                   9.3                 02/07/2023                EGFR                     77  12/07/2022                GFRNONAA                 >60                 02/07/2023                Musculoskeletal negative musculoskeletal ROS (+)    Abdominal   Peds  Hematology negative hematology ROS (+)   Anesthesia Other Findings   Reproductive/Obstetrics                              Anesthesia Physical Anesthesia Plan  ASA:  4  Anesthesia Plan: General   Post-op Pain Management:    Induction: Intravenous  PONV Risk Score and Plan: 3 and Ondansetron   Airway Management Planned: Oral ETT  Additional Equipment: Arterial line, CVP, TEE and Ultrasound Guidance Line Placement  Intra-op Plan:   Post-operative Plan: Post-operative intubation/ventilation  Informed Consent: I have reviewed the patients History and Physical, chart, labs and discussed the procedure including the risks, benefits and alternatives for the proposed anesthesia with the patient or authorized representative who has indicated his/her understanding and acceptance.     Dental advisory given  Plan Discussed with: CRNA  Anesthesia Plan Comments:          Anesthesia Quick Evaluation

## 2023-02-08 NOTE — Op Note (Signed)
 CARDIOVASCULAR SURGERY OPERATIVE NOTE  02/08/2023 Jerry [name] [id number]  Surgeon:  Dorise LOIS Fellers, MD  First Assistant: Lemond Cera,  PA-C: An experienced assistant was required given the complexity of this surgery and the standard of surgical care. The assistant was needed for exposure, dissection, suctioning, retraction of delicate tissues and sutures, instrument exchange and for overall help during this procedure.    Preoperative Diagnosis:  Severe bicuspid aortic valve stenosis   Postoperative Diagnosis:  Same   Procedure:  Median Sternotomy Extracorporeal circulation 3.   Aortic valve replacement using a 25 mm  Edwards INSPIRIS RESILIA pericardial valve.  Anesthesia:  General Endotracheal   Clinical History/Surgical Indication:  This 58 year old gentleman has stage C severe bicuspid aortic valve stenosis. He denies any symptoms but does not exercise at all and has a fairly sedentary lifestyle. He did have an exercise treadmill test that showed evidence of ischemia with exercise although he had no chest pain. The test was stopped due to shortness of breath and tired legs. I personally reviewed his 2D echocardiogram, cardiac catheterization, and CTA studies. His echo shows a mean gradient of 32 mmHg across the aortic valve with a dimensionless index of 0.23 and a peak velocity of 4.0 m/s consistent with severe aortic stenosis. His gated cardiac CTA shows a severely calcified and restricted bicuspid aortic valve with a calcium  score of 3330 consistent with severe aortic stenosis. Cardiac catheterization shows no significant coronary disease. I agree that aortic valve replacement is indicated in this patient to prevent progressive left ventricular dysfunction and sudden decompensation given his positive ETT. I would recommend using a bioprosthetic valve in this 58 year old given his medical history and lack of his social support system. I reviewed the above studies with him and  answered all of his questions. I discussed the operative procedure with the patient including alternatives, benefits and risks; including but not limited to bleeding, blood transfusion, infection, stroke, myocardial infarction, graft failure, heart block requiring a permanent pacemaker, organ dysfunction, and death. Jerry [name] understands and agrees to proceed.   Preparation:  The patient was seen in the preoperative holding area and the correct patient, correct operation were confirmed with the patient after reviewing the medical record and catheterization. The consent was signed by me. Preoperative antibiotics were given. A pulmonary arterial line and radial arterial line were placed by the anesthesia team. The patient was taken back to the operating room and positioned supine on the operating room table. After being placed under general endotracheal anesthesia by the anesthesia team a foley catheter was placed. The neck, chest, abdomen, and both legs were prepped with betadine soap and solution and draped in the usual sterile manner. A surgical time-out was taken and the correct patient and operative procedure were confirmed with the nursing and anesthesia staff.   Pre-bypass TEE:   Complete TEE assessment was performed by Dr. Medford Custard. This showed a bicuspid aortic valve with severe stenosis. Mean gradient 42 mm Hg. LV systolic function normal with moderate LVH.    Post-bypass TEE:   Normal functioning prosthetic aortic valve with no perivalvular leak or regurgitation through the valve. Left ventricular function preserved. No mitral regurgitation.    Cardiopulmonary Bypass:  A median sternotomy was performed. The pericardium was opened in the midline. Right ventricular function appeared normal. The ascending aorta was of normal size but short and had no palpable plaque. There were no contraindications to aortic cannulation or cross-clamping. The patient was fully systemically  heparinized and the  ACT was maintained > 400 sec. The proximal aortic arch was cannulated with a 22 F aortic cannula for arterial inflow. Venous cannulation was performed via the right atrial appendage using a two-staged venous cannula. An antegrade cardioplegia/vent cannula was inserted into the mid-ascending aorta. A left ventricular vent was placed via the right superior pulmonary vein. A retrograde cardioplegia cannnula was placed into the coronary sinus via the right atrium. Aortic occlusion was performed with a single cross-clamp. Systemic cooling to 32 degrees Centigrade and topical cooling of the heart with iced saline were used. Cold antegrade KBC cardioplegia was used to induce diastolic arrest and then retrograde KBC cardioplegia was given at about 60 minute intervals throughout the period of arrest to maintain myocardial temperature at or below 10 degrees centigrade. A temperature probe was inserted into the interventricular septum and an insulating pad was placed in the pericardium. Carbon dioxide was insufflated into the pericardium at 5L/min throughout the procedure to minimize intracardiac air.   Aortic Valve Replacement: assisted by Lemond Cera, PA-C  A transverse aortotomy was performed 1 cm above the take-off of the right coronary artery. The native valve was a true type 0 bicuspid with severely calcified leaflets and moderate annular calcification. The ostia of the coronary arteries were in normal bicuspid position and were not obstructed. The native valve leaflets were excised and the annulus was decalcified with rongeurs. Care was taken to remove all particulate debris. The left ventricle was directly inspected for debris and then irrigated with ice saline solution. The annulus was sized and a size 25 mm INSPIRIS RESILIA pericardial valve was chosen. The model number was [id number] and the serial number was [id number]. While the valve was being prepared 2-0 Ethibond pledgeted horizontal mattress  sutures were placed around the annulus with the pledgets in a sub-annular position. The sutures were placed through the sewing ring and the valve lowered into place. The sutures were tied using CorKnots. The valve seated nicely and the coronary ostia were not obstructed. The prosthetic valve leaflets moved normally and there was no sub-valvular obstruction. The aortotomy was closed using 4-0 Prolene suture in 2 layers with felt strips to reinforce the closure.  Completion:  The patient was rewarmed to 37 degrees Centigrade. De-airing maneuvers were performed and the head placed in trendelenburg position. The crossclamp was removed with a time of 100 minutes. There was spontaneous return of sinus rhythm. The aortotomy was checked for hemostasis. Two temporary epicardial pacing wires were placed on the right atrium and two on the right ventricle. The left ventricular vent and retrograde cardioplegia cannulas were removed. The patient was weaned from CPB without difficulty on no inotropes. CPB time was 137 minutes. Cardiac output was 5 LPM. Heparin  was fully reversed with protamine  and the aortic and venous cannulas removed. Hemostasis was achieved. Mediastinal drainage tubes were placed. The sternum was closed with double #6 stainless steel wires. The fascia was closed with continuous # 1 vicryl suture. The subcutaneous tissue was closed with 2-0 vicryl continuous suture. The skin was closed with 3-0 vicryl subcuticular suture. All sponge, needle, and instrument counts were reported correct at the end of the case. Dry sterile dressings were placed over the incisions and around the chest tubes which were connected to pleurevac suction. The patient was then transported to the surgical intensive care unit in stable condition.

## 2023-02-09 ENCOUNTER — Inpatient Hospital Stay (HOSPITAL_COMMUNITY): Payer: BC Managed Care – PPO

## 2023-02-09 ENCOUNTER — Encounter (HOSPITAL_COMMUNITY): Payer: Self-pay | Admitting: Surgery

## 2023-02-09 LAB — BASIC METABOLIC PANEL
Anion gap: 10 (ref 5–15)
Anion gap: 11 (ref 5–15)
BUN: 10 mg/dL (ref 6–20)
BUN: 12 mg/dL (ref 6–20)
CO2: 21 mmol/L — ABNORMAL LOW (ref 22–32)
CO2: 23 mmol/L (ref 22–32)
Calcium: 7.4 mg/dL — ABNORMAL LOW (ref 8.9–10.3)
Calcium: 7.8 mg/dL — ABNORMAL LOW (ref 8.9–10.3)
Chloride: 108 mmol/L (ref 98–111)
Chloride: 102 mmol/L (ref 98–111)
Creatinine, Ser: 1.17 mg/dL (ref 0.61–1.24)
Creatinine, Ser: 1.45 mg/dL — ABNORMAL HIGH (ref 0.61–1.24)
GFR, Estimated: >60 mL/min (ref >60)
GFR, Estimated: 57 mL/min — ABNORMAL LOW (ref >60)
Glucose, Bld: 156 mg/dL — ABNORMAL HIGH (ref 70–99)
Glucose, Bld: 172 mg/dL — ABNORMAL HIGH (ref 70–99)
Potassium: 4 mmol/L (ref 3.5–5.1)
Potassium: 4.5 mmol/L (ref 3.5–5.1)
Sodium: 139 mmol/L (ref 135–145)
Sodium: 136 mmol/L (ref 135–145)

## 2023-02-09 LAB — CBC
HCT: 31.1 % — ABNORMAL LOW (ref 39.0–52.0)
HCT: 28.2 % — ABNORMAL LOW (ref 39.0–52.0)
Hemoglobin: 10.8 g/dL — ABNORMAL LOW (ref 13.0–17.0)
Hemoglobin: 9.6 g/dL — ABNORMAL LOW (ref 13.0–17.0)
MCH: 34 pg (ref 26.0–34.0)
MCH: 33.9 pg (ref 26.0–34.0)
MCHC: 34.7 g/dL (ref 30.0–36.0)
MCHC: 34 g/dL (ref 30.0–36.0)
MCV: 97.8 fL (ref 80.0–100.0)
MCV: 99.6 fL (ref 80.0–100.0)
Platelets: 127 10*3/uL — ABNORMAL LOW (ref 150–400)
Platelets: 129 10*3/uL — ABNORMAL LOW (ref 150–400)
RBC: 3.18 MIL/uL — ABNORMAL LOW (ref 4.22–5.81)
RBC: 2.83 MIL/uL — ABNORMAL LOW (ref 4.22–5.81)
RDW: 13.9 % (ref 11.5–15.5)
RDW: 14.3 % (ref 11.5–15.5)
WBC: 19.2 10*3/uL — ABNORMAL HIGH (ref 4.0–10.5)
WBC: 23.8 10*3/uL — ABNORMAL HIGH (ref 4.0–10.5)
nRBC: 0 % (ref 0.0–0.2)
nRBC: 0.1 % (ref 0.0–0.2)

## 2023-02-09 LAB — GLUCOSE, CAPILLARY
Glucose-Capillary: 131 mg/dL — ABNORMAL HIGH (ref 70–99)
Glucose-Capillary: 136 mg/dL — ABNORMAL HIGH (ref 70–99)
Glucose-Capillary: 143 mg/dL — ABNORMAL HIGH (ref 70–99)
Glucose-Capillary: 146 mg/dL — ABNORMAL HIGH (ref 70–99)
Glucose-Capillary: 151 mg/dL — ABNORMAL HIGH (ref 70–99)
Glucose-Capillary: 153 mg/dL — ABNORMAL HIGH (ref 70–99)
Glucose-Capillary: 157 mg/dL — ABNORMAL HIGH (ref 70–99)
Glucose-Capillary: 162 mg/dL — ABNORMAL HIGH (ref 70–99)
Glucose-Capillary: 163 mg/dL — ABNORMAL HIGH (ref 70–99)
Glucose-Capillary: 165 mg/dL — ABNORMAL HIGH (ref 70–99)
Glucose-Capillary: 182 mg/dL — ABNORMAL HIGH (ref 70–99)

## 2023-02-09 LAB — MAGNESIUM
Magnesium: 2.9 mg/dL — ABNORMAL HIGH (ref 1.7–2.4)
Magnesium: 2.8 mg/dL — ABNORMAL HIGH (ref 1.7–2.4)

## 2023-02-09 MED ORDER — INSULIN ASPART 100 UNIT/ML IJ SOLN
0.0000 [IU] | INTRAMUSCULAR | Status: DC
  Administered 2023-02-09: 2 [IU] via SUBCUTANEOUS
  Administered 2023-02-09: 4 [IU] via SUBCUTANEOUS
  Administered 2023-02-09 – 2023-02-10 (×5): 2 [IU] via SUBCUTANEOUS

## 2023-02-09 MED ORDER — FUROSEMIDE 10 MG/ML IJ SOLN
40.0000 mg | Freq: Two times a day (BID) | INTRAMUSCULAR | Status: AC
  Administered 2023-02-09 (×2): 40 mg via INTRAVENOUS
  Filled 2023-02-09 (×2): qty 4

## 2023-02-09 MED ORDER — POTASSIUM CHLORIDE 10 MEQ/50ML IV SOLN
10.0000 meq | INTRAVENOUS | Status: AC
  Administered 2023-02-09 (×3): 10 meq via INTRAVENOUS
  Filled 2023-02-09 (×3): qty 50

## 2023-02-09 MED ORDER — ENOXAPARIN SODIUM 40 MG/0.4ML IJ SOSY
40.0000 mg | PREFILLED_SYRINGE | Freq: Every day | INTRAMUSCULAR | Status: DC
  Administered 2023-02-09 – 2023-02-19 (×11): 40 mg via SUBCUTANEOUS
  Filled 2023-02-09 (×11): qty 0.4

## 2023-02-09 MED ORDER — DIVALPROEX SODIUM ER 500 MG PO TB24
1500.0000 mg | ORAL_TABLET | Freq: Two times a day (BID) | ORAL | Status: DC
  Administered 2023-02-09 – 2023-02-19 (×21): 1500 mg via ORAL
  Filled 2023-02-09 (×24): qty 3

## 2023-02-09 MED ORDER — DIVALPROEX SODIUM ER 500 MG PO TB24
1000.0000 mg | ORAL_TABLET | Freq: Once | ORAL | Status: AC
  Administered 2023-02-09: 1000 mg via ORAL
  Filled 2023-02-09: qty 2

## 2023-02-09 NOTE — Plan of Care (Signed)
   Problem: Education: Goal: Knowledge of General Education information will improve Description Including pain rating scale, medication(s)/side effects and non-pharmacologic comfort measures Outcome: Progressing

## 2023-02-09 NOTE — Progress Notes (Signed)
      [address]       [place] [address]             [phone]                 1 Day Post-Op Procedure(s) (LRB): AORTIC VALVE REPLACEMENT (AVR) USING INSPIRIS AORTIC VALVE SIZE (N/A) TRANSESOPHAGEAL ECHOCARDIOGRAM (N/A)   Events: No events _______________________________________________________________ Vitals: BP 135/69   Pulse 82   Temp 98.8 F (37.1 C)   Resp (!) 21   Wt 112.4 kg   SpO2 92%   BMI 34.56 kg/m  Filed Weights   02/09/23 0622  Weight: 112.4 kg     - Neuro: alert NAD  - Cardiovascular: sinsu  Drips: none.   PAP: (22-40)/(7-27) 23/10 CVP:  [7 mmHg] 7 mmHg CO:  [3.7 L/min-6.4 L/min] 4.8 L/min CI:  [1.62 L/min/m2-2.85 L/min/m2] 2.4 L/min/m2  - Pulm: EWOB  ABG    Component Value Date/Time   PHART 7.386 02/08/2023 1832   PCO2ART 34.6 02/08/2023 1832   PO2ART 82 (L) 02/08/2023 1832   HCO3 20.9 02/08/2023 1832   TCO2 22 02/08/2023 1832   ACIDBASEDEF 4.0 (H) 02/08/2023 1832   O2SAT 96 02/08/2023 1832    - Abd: ND - Extremity: warm  .Intake/Output      01/03 0701 01/04 0700 01/04 0701 01/05 0700   P.O. 450    I.V. (mL/kg) 1512.7 (13.5) 100.2 (0.9)   IV Piggyback 2372.4 163.8   Total Intake(mL/kg) 4335.1 (38.6) 264 (2.3)   Urine (mL/kg/hr) 1375 (0.5) 60 (0.1)   Chest Tube 950 40   Total Output 2325 100   Net +2010.1 +164           _______________________________________________________________ Labs:    Latest Ref Rng & Units 02/09/2023    4:03 AM 02/08/2023    6:32 PM 02/08/2023    6:25 PM  CBC  WBC 4.0 - 10.5 K/uL 19.2   21.9   Hemoglobin 13.0 - 17.0 g/dL 89.1  9.9  88.0   Hematocrit 39.0 - 52.0 % 31.1  29.0  34.5   Platelets 150 - 400 K/uL 127   144       Latest Ref Rng & Units 02/09/2023    4:03 AM 02/08/2023    6:32 PM 02/08/2023    6:25 PM  CMP  Glucose 70 - 99 mg/dL 843   828   BUN 6 - 20 mg/dL 10   11   Creatinine 9.38 - 1.24 mg/dL 8.82   8.70   Sodium 864 - 145 mmol/L 139  142  142   Potassium  3.5 - 5.1 mmol/L 4.0  4.1  4.4   Chloride 98 - 111 mmol/L 108   110   CO2 22 - 32 mmol/L 21   19   Calcium  8.9 - 10.3 mg/dL 7.4   7.8     CXR: PV congestion  _______________________________________________________________  Assessment and Plan: POD 1 s/p AVR  Neuro: adjusting seizure meds.  Adjusting pain meds CV: stable Pulm: IS, ambulation.  Will keep CTs Renal: creat stable GI: on diet Heme: stable ID: afebrile Endo: SSI  Dispo: continue ICU care   Jerry [name] 02/09/2023 10:55 AM

## 2023-02-09 NOTE — TOC Initial Note (Signed)
 Transition of Care [facility]) - Initial/Assessment Note    Patient Details  Name: Jerry [name] MRN: [id number] Date of Birth: 12-04-1965  [facility] CM/SW Contact:    Arlana JINNY Nicholaus [place] Phone Number: [phone] 02/09/2023, 1:58 PM  Clinical Narrative:     12:56 PM-  CSW attempted to meet with pt at bedside. Pt was sleeping. CSW will follow up with pt at a more appropriate time.   TOC will continue following.                    Patient Goals and CMS Choice            Expected Discharge Plan and Services                                              Prior Living Arrangements/Services                       Activities of Daily Living   ADL Screening (condition at time of admission) Independently performs ADLs?: Yes (appropriate for developmental age) Is the patient deaf or have difficulty hearing?: No Does the patient have difficulty seeing, even when wearing glasses/contacts?: No Does the patient have difficulty concentrating, remembering, or making decisions?: No  Permission Sought/Granted                  Emotional Assessment              Admission diagnosis:  S/P aortic valve replacement with bioprosthetic valve [Z95.3] Patient Active Problem List   Diagnosis Date Noted   S/P aortic valve replacement with bioprosthetic valve 02/08/2023   ERRONEOUS ENCOUNTER--DISREGARD 01/11/2023   Severe aortic stenosis 12/10/2022   History of craniotomy 11/05/2022   Partial symptomatic epilepsy with complex partial seizures, not intractable, without status epilepticus (HCC) 11/05/2022   Aortic valve stenosis, severe 07/06/2022   Bicuspid aortic valve 07/06/2022   Benign essential microscopic hematuria 06/06/2016   HLD (hyperlipidemia) 06/27/2015   Venous stasis 01/21/2015   Seizure disorder (HCC) 10/07/2013   Head injury    PCP:  Trudy Vaughn FALCON, MD Pharmacy:   [facility] [address], [place] - [address]  [address] [place] [address] Phone: [phone] Fax: [phone]     Social Drivers of Health (SDOH) Social History: SDOH Screenings   Food Insecurity: No Food Insecurity (02/09/2023)  Housing: Low Risk  (02/09/2023)  Transportation Needs: No Transportation Needs (02/09/2023)  Utilities: Not At Risk (02/09/2023)  Depression (PHQ2-9): Low Risk  (05/11/2020)  Tobacco Use: Medium Risk (02/09/2023)   SDOH Interventions:     Readmission Risk Interventions     No data to display

## 2023-02-09 NOTE — Plan of Care (Signed)
  Problem: Clinical Measurements: Goal: Ability to maintain clinical measurements within normal limits will improve Outcome: Progressing Goal: Will remain free from infection Outcome: Progressing Goal: Diagnostic test results will improve Outcome: Progressing Goal: Cardiovascular complication will be avoided Outcome: Progressing   Problem: Activity: Goal: Risk for activity intolerance will decrease Outcome: Progressing   Problem: Coping: Goal: Level of anxiety will decrease Outcome: Progressing

## 2023-02-10 ENCOUNTER — Inpatient Hospital Stay (HOSPITAL_COMMUNITY): Payer: BC Managed Care – PPO

## 2023-02-10 LAB — BASIC METABOLIC PANEL
Anion gap: 9 (ref 5–15)
BUN: 17 mg/dL (ref 6–20)
CO2: 27 mmol/L (ref 22–32)
Calcium: 7.8 mg/dL — ABNORMAL LOW (ref 8.9–10.3)
Chloride: 101 mmol/L (ref 98–111)
Creatinine, Ser: 1.59 mg/dL — ABNORMAL HIGH (ref 0.61–1.24)
GFR, Estimated: 51 mL/min — ABNORMAL LOW (ref >60)
Glucose, Bld: 129 mg/dL — ABNORMAL HIGH (ref 70–99)
Potassium: 4.1 mmol/L (ref 3.5–5.1)
Sodium: 137 mmol/L (ref 135–145)

## 2023-02-10 LAB — GLUCOSE, CAPILLARY
Glucose-Capillary: 113 mg/dL — ABNORMAL HIGH (ref 70–99)
Glucose-Capillary: 114 mg/dL — ABNORMAL HIGH (ref 70–99)
Glucose-Capillary: 121 mg/dL — ABNORMAL HIGH (ref 70–99)
Glucose-Capillary: 126 mg/dL — ABNORMAL HIGH (ref 70–99)
Glucose-Capillary: 145 mg/dL — ABNORMAL HIGH (ref 70–99)
Glucose-Capillary: 98 mg/dL (ref 70–99)

## 2023-02-10 LAB — CBC
HCT: 23.6 % — ABNORMAL LOW (ref 39.0–52.0)
Hemoglobin: 8.2 g/dL — ABNORMAL LOW (ref 13.0–17.0)
MCH: 34.6 pg — ABNORMAL HIGH (ref 26.0–34.0)
MCHC: 34.7 g/dL (ref 30.0–36.0)
MCV: 99.6 fL (ref 80.0–100.0)
Platelets: 120 10*3/uL — ABNORMAL LOW (ref 150–400)
RBC: 2.37 MIL/uL — ABNORMAL LOW (ref 4.22–5.81)
RDW: 14.5 % (ref 11.5–15.5)
WBC: 21 10*3/uL — ABNORMAL HIGH (ref 4.0–10.5)
nRBC: 0.2 % (ref 0.0–0.2)

## 2023-02-10 MED ORDER — METOPROLOL TARTRATE 25 MG PO TABS
25.0000 mg | ORAL_TABLET | Freq: Two times a day (BID) | ORAL | Status: DC
  Administered 2023-02-10 – 2023-02-17 (×14): 25 mg via ORAL
  Filled 2023-02-10 (×14): qty 1

## 2023-02-10 MED ORDER — SODIUM CHLORIDE 0.9 % IV SOLN: 250.0000 mL | INTRAVENOUS | Status: AC | PRN

## 2023-02-10 MED ORDER — ~~LOC~~ CARDIAC SURGERY, PATIENT & FAMILY EDUCATION
Freq: Once | Status: AC
Start: 2023-02-10 — End: 2023-02-10

## 2023-02-10 MED ORDER — INSULIN ASPART 100 UNIT/ML IJ SOLN
0.0000 [IU] | Freq: Three times a day (TID) | INTRAMUSCULAR | Status: DC
  Administered 2023-02-13: 4 [IU] via SUBCUTANEOUS

## 2023-02-10 MED ORDER — SODIUM CHLORIDE 0.9% FLUSH
3.0000 mL | INTRAVENOUS | Status: DC | PRN
Start: 2023-02-10 — End: 2023-02-20
  Administered 2023-02-18: 3 mL via INTRAVENOUS

## 2023-02-10 MED ORDER — SODIUM CHLORIDE 0.9% FLUSH
3.0000 mL | Freq: Two times a day (BID) | INTRAVENOUS | Status: DC
  Administered 2023-02-10 – 2023-02-19 (×18): 3 mL via INTRAVENOUS

## 2023-02-10 MED ORDER — AMIODARONE HCL 200 MG PO TABS
400.0000 mg | ORAL_TABLET | Freq: Two times a day (BID) | ORAL | Status: DC
  Administered 2023-02-10 – 2023-02-14 (×10): 400 mg via ORAL
  Filled 2023-02-10 (×11): qty 2

## 2023-02-10 MED ORDER — FUROSEMIDE 40 MG PO TABS
40.0000 mg | ORAL_TABLET | Freq: Every day | ORAL | Status: DC
  Administered 2023-02-10: 40 mg via ORAL
  Filled 2023-02-10: qty 1

## 2023-02-10 NOTE — Progress Notes (Signed)
      [address]       [place] [address]             [phone]                 2 Days Post-Op Procedure(s) (LRB): AORTIC VALVE REPLACEMENT (AVR) USING INSPIRIS AORTIC VALVE SIZE (N/A) TRANSESOPHAGEAL ECHOCARDIOGRAM (N/A)   Events: No events _______________________________________________________________ Vitals: BP (!) 140/72   Pulse 76   Temp 98.8 F (37.1 C)   Resp 15   Ht 5' 11 (1.803 m)   Wt 110.5 kg   SpO2 95%   BMI 33.98 kg/m  Filed Weights   02/09/23 0622 02/10/23 0500  Weight: 112.4 kg 110.5 kg     - Neuro: alert NAD  - Cardiovascular: sinsu  Drips: amio     - Pulm: EWOB  ABG    Component Value Date/Time   PHART 7.386 02/08/2023 1832   PCO2ART 34.6 02/08/2023 1832   PO2ART 82 (L) 02/08/2023 1832   HCO3 20.9 02/08/2023 1832   TCO2 22 02/08/2023 1832   ACIDBASEDEF 4.0 (H) 02/08/2023 1832   O2SAT 96 02/08/2023 1832    - Abd: ND - Extremity: warm  .Intake/Output      01/04 0701 01/05 0700 01/05 0701 01/06 0700   P.O. 960    I.V. (mL/kg) 454.4 (4.1) 53.1 (0.5)   IV Piggyback 413.7 100   Total Intake(mL/kg) 1828.1 (16.5) 153.1 (1.4)   Urine (mL/kg/hr) 1220 (0.5) 115 (0.3)   Chest Tube 640 50   Total Output 1860 165   Net -31.9 -11.9           _______________________________________________________________ Labs:    Latest Ref Rng & Units 02/10/2023    5:08 AM 02/09/2023    4:47 PM 02/09/2023    4:03 AM  CBC  WBC 4.0 - 10.5 K/uL 21.0  23.8  19.2   Hemoglobin 13.0 - 17.0 g/dL 8.2  9.6  89.1   Hematocrit 39.0 - 52.0 % 23.6  28.2  31.1   Platelets 150 - 400 K/uL 120  129  127       Latest Ref Rng & Units 02/10/2023    5:08 AM 02/09/2023    4:47 PM 02/09/2023    4:03 AM  CMP  Glucose 70 - 99 mg/dL 870  827  843   BUN 6 - 20 mg/dL 17  12  10    Creatinine 0.61 - 1.24 mg/dL 8.40  8.54  8.82   Sodium 135 - 145 mmol/L 137  136  139   Potassium 3.5 - 5.1 mmol/L 4.1  4.5  4.0   Chloride 98 - 111 mmol/L 101  102  108    CO2 22 - 32 mmol/L 27  23  21    Calcium  8.9 - 10.3 mg/dL 7.8  7.8  7.4     CXR: PV congestion  _______________________________________________________________  Assessment and Plan: POD 2 s/p AVR  Neuro:stable CV: stable Pulm: IS, ambulation.  Will remove CTs Renal: creat stable GI: on diet Heme: stable ID: afebrile Endo: SSI  Dispo: floor   Jerry [name] 02/10/2023 10:12 AM

## 2023-02-10 NOTE — Plan of Care (Signed)
 Patient still requiring 4L [place] or maintain O2 saturation. Pain controled with PRN pain medications. Worked well with incentive spirometer and ambulation. Patient complained of itching, day shift RN will be made aware. Shows positive progression overall.   Frederic Molly RN 02/10/23 Problem: Education: Goal: Knowledge of General Education information will improve Description: Including pain rating scale, medication(s)/side effects and non-pharmacologic comfort measures Outcome: Progressing   Problem: Health Behavior/Discharge Planning: Goal: Ability to manage health-related needs will improve Outcome: Progressing   Problem: Clinical Measurements: Goal: Ability to maintain clinical measurements within normal limits will improve Outcome: Progressing Goal: Will remain free from infection Outcome: Progressing Goal: Diagnostic test results will improve Outcome: Progressing Goal: Respiratory complications will improve Outcome: Progressing Goal: Cardiovascular complication will be avoided Outcome: Progressing   Problem: Activity: Goal: Risk for activity intolerance will decrease Outcome: Progressing   Problem: Nutrition: Goal: Adequate nutrition will be maintained Outcome: Progressing   Problem: Coping: Goal: Level of anxiety will decrease Outcome: Progressing   Problem: Elimination: Goal: Will not experience complications related to bowel motility Outcome: Progressing Goal: Will not experience complications related to urinary retention Outcome: Progressing   Problem: Pain Management: Goal: General experience of comfort will improve Outcome: Progressing   Problem: Safety: Goal: Ability to remain free from injury will improve Outcome: Progressing   Problem: Skin Integrity: Goal: Risk for impaired skin integrity will decrease Outcome: Progressing   Problem: Education: Goal: Will demonstrate proper wound care and an understanding of methods to prevent future  damage Outcome: Progressing Goal: Knowledge of disease or condition will improve Outcome: Progressing Goal: Knowledge of the prescribed therapeutic regimen will improve Outcome: Progressing Goal: Individualized Educational Video(s) Outcome: Progressing   Problem: Activity: Goal: Risk for activity intolerance will decrease Outcome: Progressing   Problem: Cardiac: Goal: Will achieve and/or maintain hemodynamic stability Outcome: Progressing   Problem: Clinical Measurements: Goal: Postoperative complications will be avoided or minimized Outcome: Progressing   Problem: Respiratory: Goal: Respiratory status will improve Outcome: Progressing   Problem: Skin Integrity: Goal: Wound healing without signs and symptoms of infection Outcome: Progressing Goal: Risk for impaired skin integrity will decrease Outcome: Progressing   Problem: Urinary Elimination: Goal: Ability to achieve and maintain adequate renal perfusion and functioning will improve Outcome: Progressing

## 2023-02-10 NOTE — Progress Notes (Signed)
   02/10/23 1930  Spiritual Encounters  Type of Visit Initial  Care provided to: Patient  Conversation partners present during encounter Nurse  Referral source Patient request  Reason for visit Routine spiritual support  OnCall Visit Yes   Chaplain responded to a request to visit. Patient has been feeling lonely, and states he doesn't have anyone - no siblings, kids, spouses, etc. Chaplain visited with the patient and offered support and listening. Chaplain introduced spiritual care services. Spiritual care services available as needed.   Juliene CHRISTELLA Das, Chaplain 02/10/23

## 2023-02-10 NOTE — Progress Notes (Deleted)
 This RN called E-link to confirm patient's code status. The most recent critical care note says that the patient is to be full DNR. However, the orders reflected on the EMR say that the patient's code status is limited DNR.   E-link nurse and MD advised and was told that the current order reflects the full DNR status

## 2023-02-11 ENCOUNTER — Inpatient Hospital Stay (HOSPITAL_COMMUNITY): Payer: BC Managed Care – PPO

## 2023-02-11 ENCOUNTER — Encounter (HOSPITAL_COMMUNITY): Payer: Self-pay | Admitting: Surgery

## 2023-02-11 ENCOUNTER — Other Ambulatory Visit: Payer: Self-pay | Admitting: Cardiology

## 2023-02-11 DIAGNOSIS — Z952 Presence of prosthetic heart valve: Secondary | ICD-10-CM

## 2023-02-11 LAB — GLUCOSE, CAPILLARY
Glucose-Capillary: 104 mg/dL — ABNORMAL HIGH (ref 70–99)
Glucose-Capillary: 113 mg/dL — ABNORMAL HIGH (ref 70–99)
Glucose-Capillary: 95 mg/dL (ref 70–99)
Glucose-Capillary: 88 mg/dL (ref 70–99)

## 2023-02-11 LAB — CBC
HCT: 21.9 % — ABNORMAL LOW (ref 39.0–52.0)
Hemoglobin: 7.3 g/dL — ABNORMAL LOW (ref 13.0–17.0)
MCH: 33.8 pg (ref 26.0–34.0)
MCHC: 33.3 g/dL (ref 30.0–36.0)
MCV: 101.4 fL — ABNORMAL HIGH (ref 80.0–100.0)
Platelets: 127 10*3/uL — ABNORMAL LOW (ref 150–400)
RBC: 2.16 MIL/uL — ABNORMAL LOW (ref 4.22–5.81)
RDW: 14.6 % (ref 11.5–15.5)
WBC: 17.2 10*3/uL — ABNORMAL HIGH (ref 4.0–10.5)
nRBC: 0.2 % (ref 0.0–0.2)

## 2023-02-11 LAB — BASIC METABOLIC PANEL
Anion gap: 9 (ref 5–15)
BUN: 29 mg/dL — ABNORMAL HIGH (ref 6–20)
CO2: 26 mmol/L (ref 22–32)
Calcium: 7.9 mg/dL — ABNORMAL LOW (ref 8.9–10.3)
Chloride: 101 mmol/L (ref 98–111)
Creatinine, Ser: 1.63 mg/dL — ABNORMAL HIGH (ref 0.61–1.24)
GFR, Estimated: 49 mL/min — ABNORMAL LOW (ref >60)
Glucose, Bld: 116 mg/dL — ABNORMAL HIGH (ref 70–99)
Potassium: 4.1 mmol/L (ref 3.5–5.1)
Sodium: 136 mmol/L (ref 135–145)

## 2023-02-11 LAB — SURGICAL PATHOLOGY

## 2023-02-11 MED ORDER — FE FUM-VIT C-VIT B12-FA 460-60-0.01-1 MG PO CAPS
1.0000 | ORAL_CAPSULE | Freq: Two times a day (BID) | ORAL | Status: DC
  Administered 2023-02-11 – 2023-02-19 (×18): 1 via ORAL
  Filled 2023-02-11 (×21): qty 1

## 2023-02-11 MED FILL — Heparin Sodium (Porcine) Inj 1000 Unit/ML: INTRAMUSCULAR | Qty: 10 | Status: AC

## 2023-02-11 MED FILL — Sodium Bicarbonate IV Soln 8.4%: INTRAVENOUS | Qty: 50 | Status: AC

## 2023-02-11 MED FILL — Potassium Chloride Inj 2 mEq/ML: INTRAVENOUS | Qty: 40 | Status: AC

## 2023-02-11 MED FILL — Electrolyte-R (PH 7.4) Solution: INTRAVENOUS | Qty: 5000 | Status: AC

## 2023-02-11 MED FILL — Thrombin (Recombinant) For Soln 20000 Unit: CUTANEOUS | Qty: 1 | Status: AC

## 2023-02-11 MED FILL — Heparin Sodium (Porcine) Inj 1000 Unit/ML: Qty: 1000 | Status: AC

## 2023-02-11 MED FILL — Mannitol IV Soln 20%: INTRAVENOUS | Qty: 500 | Status: AC

## 2023-02-11 MED FILL — Albumin, Human Inj 5%: INTRAVENOUS | Qty: 250 | Status: AC

## 2023-02-11 MED FILL — Heparin Sodium (Porcine) Inj 1000 Unit/ML: INTRAMUSCULAR | Qty: 2500 | Status: AC

## 2023-02-11 MED FILL — Sodium Chloride IV Soln 0.9%: INTRAVENOUS | Qty: 3000 | Status: AC

## 2023-02-11 MED FILL — Lidocaine HCl Local Preservative Free (PF) Inj 2%: INTRAMUSCULAR | Qty: 14 | Status: AC

## 2023-02-11 NOTE — Evaluation (Signed)
 Physical Therapy Evaluation Patient Details Name: Jerry [name] MRN: [id number] DOB: April 06, 1965 Today's Date: 02/11/2023  History of Present Illness  58 yo male admitted 1/2 with severe AS. 1/3 AVR. PMHx: HLD, TBI 1984 with craniotomy, seizure disorder, AS  Clinical Impression  Pt pleasant, lives alone and reports independence PTA. On arrival pt sleeping and when awakened very confused and stating he was having a bad dream. Even after pt sitting he had trouble clearing cognition with varied responses of home assist and slow to respond to setup. Pt with impaired cognition, balance, transfers and safety. Pt states friend Niels can stay with him but question reliability given she works. If pt has 24hr supervision then he could return home with HHPT but otherwise pt will benefit from continued inpatient follow up therapy, <3 hours/day. Pt will benefit from acute therapy to maximize mobility, safety, independence and adherence to precautions.   HR 79-82 SPO2 93% on RA Supine 104/57 (69) After gait 123/57 (78)          If plan is discharge home, recommend the following: A little help with walking and/or transfers;A little help with bathing/dressing/bathroom;Assistance with cooking/housework;Direct supervision/assist for financial management;Assist for transportation;Supervision due to cognitive status   Can travel by private vehicle   Yes    Equipment Recommendations Rolling walker (2 wheels);BSC/3in1  Recommendations for Other Services  OT consult    Functional Status Assessment Patient has had a recent decline in their functional status and demonstrates the ability to make significant improvements in function in a reasonable and predictable amount of time.     Precautions / Restrictions Precautions Precautions: Sternal;Fall Precaution Booklet Issued: No Precaution Comments: educated 4x during session for all precautions with pt able to state 1/4 end of session      Mobility   Bed Mobility Overal bed mobility: Needs Assistance Bed Mobility: Rolling, Sidelying to Sit Rolling: Min assist Sidelying to sit: Min assist       General bed mobility comments: min assist to roll and rise from surface with cues for sequence and assist to elevate trunk from surface. pt unaware of incontinent urine    Transfers Overall transfer level: Needs assistance   Transfers: Sit to/from Stand Sit to Stand: Contact guard assist           General transfer comment: cues for hand placement, sequence and rise from bed and return to chair    Ambulation/Gait Ambulation/Gait assistance: Contact guard assist Gait Distance (Feet): 200 Feet Assistive device: Rolling walker (2 wheels) Gait Pattern/deviations: Step-through pattern, Decreased stride length   Gait velocity interpretation: 1.31 - 2.62 ft/sec, indicative of limited community ambulator   General Gait Details: cues for posture, proximity to RW and direction, short steps with pt tendency to remain too posterior to [employer]            Wheelchair Mobility     Tilt Bed    Modified Rankin (Stroke Patients Only)       Balance Overall balance assessment: Needs assistance   Sitting balance-Leahy Scale: Fair     Standing balance support: Bilateral upper extremity supported, Reliant on assistive device for balance Standing balance-Leahy Scale: Poor Standing balance comment: RW in standing                             Pertinent Vitals/Pain Pain Assessment Pain Assessment: No/denies pain    Home Living Family/patient expects to be discharged to:: Private residence Living  Arrangements: Alone   Type of Home: House Home Access: Level entry       Home Layout: One level Home Equipment: None      Prior Function Prior Level of Function : Independent/Modified Independent;Driving                     Extremity/Trunk Assessment   Upper Extremity Assessment Upper Extremity  Assessment: Generalized weakness    Lower Extremity Assessment Lower Extremity Assessment: Generalized weakness    Cervical / Trunk Assessment Cervical / Trunk Assessment: Normal  Communication   Communication Communication: No apparent difficulties Cueing Techniques: Verbal cues;Tactile cues  Cognition Arousal: Alert Behavior During Therapy: Flat affect Overall Cognitive Status: Impaired/Different from baseline Area of Impairment: Orientation, Attention, Memory, Following commands, Safety/judgement, Problem solving                 Orientation Level: Disoriented to, Time, Situation, Place Current Attention Level: Sustained Memory: Decreased short-term memory Following Commands: Follows one step commands consistently, Follows one step commands with increased time Safety/Judgement: Decreased awareness of safety, Decreased awareness of deficits   Problem Solving: Slow processing, Decreased initiation General Comments: pt sleeping on arrival and with initial awakening very confused stating he was having a bad dream. Pt able to recall 1/4 precautions and needs mod cues to maintain precautions. Pt stating friend Niels can stay with him 24hrs but then reported she works and will stay on the weekend. varied responses to questions and lack of carryover        General Comments      Exercises     Assessment/Plan    PT Assessment Patient needs continued PT services  PT Problem List Decreased strength;Decreased activity tolerance;Decreased balance;Decreased mobility;Decreased knowledge of precautions;Decreased knowledge of use of DME;Decreased cognition;Decreased safety awareness;Decreased coordination       PT Treatment Interventions DME instruction;Gait training;Functional mobility training;Therapeutic activities;Therapeutic exercise;Patient/family education;Balance training;Cognitive remediation;Neuromuscular re-education    PT Goals (Current goals can be found in the Care  Plan section)  Acute Rehab PT Goals Patient Stated Goal: return home PT Goal Formulation: With patient Time For Goal Achievement: 02/25/23 Potential to Achieve Goals: Good    Frequency Min 1X/week     Co-evaluation               AM-PAC PT 6 Clicks Mobility  Outcome Measure Help needed turning from your back to your side while in a flat bed without using bedrails?: A Little Help needed moving from lying on your back to sitting on the side of a flat bed without using bedrails?: A Little Help needed moving to and from a bed to a chair (including a wheelchair)?: A Little Help needed standing up from a chair using your arms (e.g., wheelchair or bedside chair)?: A Little Help needed to walk in hospital room?: A Little Help needed climbing 3-5 steps with a railing? : A Lot 6 Click Score: 17    End of Session Equipment Utilized During Treatment: Gait belt Activity Tolerance: Patient tolerated treatment well Patient left: in chair;with call bell/phone within reach;with chair alarm set;with nursing/sitter in room Nurse Communication: Mobility status;Precautions PT Visit Diagnosis: Other abnormalities of gait and mobility (R26.89);Difficulty in walking, not elsewhere classified (R26.2)    Time: 8764-8694 PT Time Calculation (min) (ACUTE ONLY): 30 min   Charges:   PT Evaluation $PT Eval Moderate Complexity: 1 Mod PT Treatments $Gait Training: 8-22 mins PT General Charges $$ ACUTE PT VISIT: 1 Visit  Lenoard SQUIBB, PT Acute Rehabilitation Services Office: [phone]   Lenoard KATHEE Docker 02/11/2023, 1:41 PM

## 2023-02-11 NOTE — TOC Initial Note (Addendum)
 Transition of Care [facility]) - Initial/Assessment Note    Patient Details  Name: Jerry [name] MRN: [id number] Date of Birth: 1965-10-21  [facility] CM/SW Contact:    Isaiah Public, LCSWA Phone Number: 02/11/2023, 4:42 PM  Clinical Narrative:                  CSW received consult for possible SNF placement at time of discharge. CSW spoke with patient regarding PT recommendation of SNF placement at time of discharge. Patient reports PTA he comes from home alone.Patient expressed understanding of PT recommendation and politely declined SNF placement at time of discharge. Patient reports he would like to return home when medically ready for dc. Patient would like to speak with case manager for home needs. Patient reports he has  a friend that can help provide support at home if needed. CSW informed patient that CM will follow up to discuss patients home needs when able. All questions answered. No further questions reported at this time. CSW informed CM. CSW to continue to follow and assist with discharge planning needs.   Expected Discharge Plan:  (wants to discuss home options. Wants to return home.) Barriers to Discharge: Continued Medical Work up   Patient Goals and CMS Choice Patient states their goals for this hospitalization and ongoing recovery are:: to return home   Choice offered to / list presented to : Patient      Expected Discharge Plan and Services In-house Referral: Clinical Social Work     Living arrangements for the past 2 months: Single Family Home                                      Prior Living Arrangements/Services Living arrangements for the past 2 months: Single Family Home Lives with:: Self Patient language and need for interpreter reviewed:: Yes Do you feel safe going back to the place where you live?: Yes      Need for Family Participation in Patient Care: Yes (Comment) Care giver support system in place?: Yes (comment)   Criminal  Activity/Legal Involvement Pertinent to Current Situation/Hospitalization: No - Comment as needed  Activities of Daily Living   ADL Screening (condition at time of admission) Independently performs ADLs?: Yes (appropriate for developmental age) Is the patient deaf or have difficulty hearing?: No Does the patient have difficulty seeing, even when wearing glasses/contacts?: No Does the patient have difficulty concentrating, remembering, or making decisions?: No  Permission Sought/Granted Permission sought to share information with : Case Manager, Magazine Features Editor, Family Supports                Emotional Assessment   Attitude/Demeanor/Rapport: Gracious Affect (typically observed): Calm Orientation: : Oriented to Self, Oriented to Place, Oriented to  Time, Oriented to Situation Alcohol / Substance Use: Not Applicable Psych Involvement: No (comment)  Admission diagnosis:  S/P aortic valve replacement with bioprosthetic valve [Z95.3] Patient Active Problem List   Diagnosis Date Noted   S/P aortic valve replacement with bioprosthetic valve 02/08/2023   ERRONEOUS ENCOUNTER--DISREGARD 01/11/2023   Severe aortic stenosis 12/10/2022   History of craniotomy 11/05/2022   Partial symptomatic epilepsy with complex partial seizures, not intractable, without status epilepticus (HCC) 11/05/2022   Aortic valve stenosis, severe 07/06/2022   Bicuspid aortic valve 07/06/2022   Benign essential microscopic hematuria 06/06/2016   HLD (hyperlipidemia) 06/27/2015   Venous stasis 01/21/2015   Seizure disorder (HCC)  10/07/2013   Head injury    PCP:  Trudy Vaughn FALCON, MD Pharmacy:   [facility] [address] [place] [phone] [place] [address] Phone: [phone] Fax: [phone]     Social Drivers of Health (SDOH) Social History: SDOH Screenings   Food Insecurity: No Food Insecurity (02/09/2023)  Housing: Low Risk  (02/09/2023)   Transportation Needs: No Transportation Needs (02/09/2023)  Utilities: Not At Risk (02/09/2023)  Depression (PHQ2-9): Low Risk  (05/11/2020)  Tobacco Use: Medium Risk (02/09/2023)   SDOH Interventions:     Readmission Risk Interventions     No data to display

## 2023-02-11 NOTE — Evaluation (Signed)
 Occupational Therapy Evaluation Patient Details Name: Jerry [name] MRN: [id number] DOB: Aug 18, 1965 Today's Date: 02/11/2023   History of Present Illness 58 yo male admitted 1/2 with severe AS. 1/3 AVR. 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 PTA pt lived at home alone and reports that he was Ind with ADLs/selfcare, IADLs, home mgt and mobility.  Pt currently requires max A with LB ADLs and CGA with transfers using RW. Pt states friend Jerry [name] can stay with him and other friends can assist him, but question accuracy of this info. Pt would benefit from acute OT services to address impairments to maximize level of function and safety  HR 86-89 SPO2 97% on RA Supine 101/59 (69) After in room activity 121/102      If plan is discharge home, recommend the following:      Functional Status Assessment  Patient has had a recent decline in their functional status and demonstrates the ability to make significant improvements in function in a reasonable and predictable amount of time.  Equipment Recommendations  Tub/shower seat    Recommendations for Other Services       Precautions / Restrictions Precautions Precautions: Sternal;Fall Precaution Booklet Issued: No Precaution Comments: reviewed sternal precautions Restrictions Weight Bearing Restrictions Per Provider Order: Yes      Mobility Bed Mobility Overal bed mobility: Needs Assistance Bed Mobility: Rolling, Sidelying to Sit, Sit to Sidelying Rolling: Min assist Sidelying to sit: Min assist     Sit to sidelying: Min assist General bed mobility comments: min A to elavte trunk and with LE mgt onto bed,min verbal cues for sternal preacautions and use heart pillow and to not grab bed rails    Transfers Overall transfer level: Needs assistance    Transfers: Sit to/from Stand Sit to Stand: Contact guard assist           General transfer comment: cues for hand placement, sequence and rise from bed and return to chair      Balance Overall balance assessment: Needs assistance   Sitting balance-Leahy Scale: Fair     Standing balance support: Bilateral upper extremity supported, Reliant on assistive device for balance Standing balance-Leahy Scale: Poor                             ADL either performed or assessed with clinical judgement   ADL Overall ADL's : Needs assistance/impaired Eating/Feeding: Independent;Sitting   Grooming: Wash/dry hands;Wash/dry face;Minimal assistance;Standing   Upper Body Bathing: Contact guard assist;Sitting   Lower Body Bathing: Maximal assistance   Upper Body Dressing : Contact guard assist;Sitting   Lower Body Dressing: Maximal assistance   Toilet Transfer: Contact guard assist;Stand-pivot;Cueing for safety   Toileting- Clothing Manipulation and Hygiene: Moderate assistance       Functional mobility during ADLs: Contact guard assist;Cueing for safety       Vision Baseline Vision/History: 1 Wears glasses Ability to See in Adequate Light: 0 Adequate Patient Visual Report: No change from baseline       Perception         Praxis         Pertinent Vitals/Pain Pain Assessment Pain Assessment: No/denies pain     Extremity/Trunk Assessment Upper Extremity Assessment Upper Extremity Assessment: Generalized weakness   Lower Extremity Assessment Lower Extremity Assessment: Defer to  PT evaluation   Cervical / Trunk Assessment Cervical / Trunk Assessment: Normal   Communication Communication Communication: No apparent difficulties Cueing Techniques: Verbal cues;Tactile cues   Cognition Arousal: Alert Behavior During Therapy: Flat affect Overall Cognitive Status: Impaired/Different from baseline Area of Impairment: Orientation, Attention, Memory,  Following commands, Safety/judgement, Problem solving                 Orientation Level: Disoriented to, Time, Situation   Memory: Decreased short-term memory Following Commands: Follows one step commands consistently, Follows one step commands with increased time     Problem Solving: Slow processing, Decreased initiation General Comments: Pt stating friend, mainly Jerry [name] and other friends can stay with him 24 hrs but then reported Jerry [name] works and will stay on the weekend     General Comments       Exercises     Shoulder Instructions      Home Living Family/patient expects to be discharged to:: Private residence Living Arrangements: Alone Available Help at Discharge: Friend(s);Other (Comment) (uncertain of accuracy of info, pt stated friend Jerry [name] and other friends can assist him) Type of Home: House Home Access: Level entry     Home Layout: One level     Bathroom Shower/Tub: Producer, Television/film/video: Standard     Home Equipment: None          Prior Functioning/Environment Prior Level of Function : Independent/Modified Independent;Driving               ADLs Comments: Ind        OT Problem List: Decreased strength;Impaired balance (sitting and/or standing);Decreased cognition;Decreased activity tolerance;Decreased safety awareness;Decreased knowledge of use of DME or AE;Obesity;Decreased knowledge of precautions      OT Treatment/Interventions: Self-care/ADL training;DME and/or AE instruction;Therapeutic activities;Balance training;Patient/family education    OT Goals(Current goals can be found in the care plan section) Acute Rehab OT Goals Patient Stated Goal: go home OT Goal Formulation: With patient Time For Goal Achievement: 02/25/23 Potential to Achieve Goals: Good ADL Goals Pt Will Perform Grooming: with contact guard assist;with supervision;standing Pt Will Perform Upper Body Bathing: with supervision;with set-up;sitting Pt Will  Perform Lower Body Bathing: with mod assist;with min assist;with adaptive equipment Pt Will Perform Upper Body Dressing: with supervision;with set-up;sitting Pt Will Perform Lower Body Dressing: with mod assist;with min assist;with adaptive equipment Pt Will Transfer to Toilet: with supervision;ambulating Pt Will Perform Toileting - Clothing Manipulation and hygiene: with min assist;with contact guard assist;sit to/from stand  OT Frequency: Min 2X/week    Co-evaluation              AM-PAC OT 6 Clicks Daily Activity     Outcome Measure Help from another person eating meals?: None Help from another person taking care of personal grooming?: A Little Help from another person toileting, which includes using toliet, bedpan, or urinal?: A Lot Help from another person bathing (including washing, rinsing, drying)?: A Lot Help from another person to put on and taking off regular upper body clothing?: A Little Help from another person to put on and taking off regular lower body clothing?: A Lot 6 Click Score: 16   End of Session Equipment Utilized During Treatment: Gait belt;Rolling walker (2 wheels)  Activity Tolerance: Patient tolerated treatment well;Other (comment) (confusion) Patient left: in bed;with call bell/phone within reach;with bed alarm set  OT Visit Diagnosis: Unsteadiness on feet (R26.81);Other abnormalities of gait and mobility (R26.89);Muscle weakness (generalized) (M62.81)  Time: 8557-8498 OT Time Calculation (min): 19 min Charges:  OT General Charges $OT Visit: 1 Visit OT Evaluation $OT Eval Moderate Complexity: 1 Mod OT Treatments $Therapeutic Activity: 8-22 mins    Jacques Karna Loose 02/11/2023, 3:12 PM

## 2023-02-11 NOTE — Progress Notes (Signed)
   02/11/23 1500  Spiritual Encounters  Type of Visit Initial  Care provided to: Patient  Referral source Patient request  Reason for visit Routine spiritual support  OnCall Visit No  Spiritual Framework  Presenting Themes Meaning/purpose/sources of inspiration  Needs/Challenges/Barriers loneliness  Patient Stress Factors Loss  Interventions  Spiritual Care Interventions Made Established relationship of care and support;Compassionate presence;Reflective listening;Normalization of emotions;Supported grief process  Intervention Outcomes  Outcomes Connection to spiritual care;Reduced isolation;Awareness of support   Chaplain responded to request for emotional and spiritual support. There was no family present at bedside. Patient said he does not have any family or friends. He is alone in the world. His mother died last year he is still grieving the loss. Chaplain provided companionship and supported the grief process. Chaplain remains available. Patient will page chaplain when needed.

## 2023-02-11 NOTE — Progress Notes (Addendum)
 [address]       [place] [address]             [phone]      3 Days Post-Op Procedure(s) (LRB): AORTIC VALVE REPLACEMENT (AVR) USING INSPIRIS AORTIC VALVE SIZE (N/A) TRANSESOPHAGEAL ECHOCARDIOGRAM (N/A) Subjective: Transferred from ICU yesterday.  Awake, oriented but has slow responses to questions.  Says pain is controlled.   Objective: Vital signs in last 24 hours: Temp:  [97.9 F (36.6 C)-99.1 F (37.3 C)] 98.3 F (36.8 C) (01/06 0310) Pulse Rate:  [71-81] 71 (01/06 0310) Cardiac Rhythm: Normal sinus rhythm (01/05 2019) Resp:  [11-17] 14 (01/06 0310) BP: (108-150)/(56-94) 120/63 (01/06 0310) SpO2:  [91 %-100 %] 93 % (01/06 0310) Weight:  [113.1 kg] 113.1 kg (01/06 0541)     Intake/Output from previous day: 01/05 0701 - 01/06 0700 In: 696.5 [P.O.:480; I.V.:116.5; IV Piggyback:100] Out: 765 [Urine:715; Chest Tube:50] Intake/Output this shift: No intake/output data recorded.  General appearance: alert, cooperative, and mild distress Neurologic: slow responses to questions, no focal deficits.  Heart: RRR, monitor showing SR since arrival to 4E. Lungs: breath sounds full and clear. SCR showing clear lung fields, mild bibasilar ATX.  Abdomen: firm, no tenderness Extremities: no peripheral edema Wound: the sternotomy incision is open to air and is intact and dry.   Lab Results: Recent Labs    02/10/23 0508 02/11/23 0256  WBC 21.0* 17.2*  HGB 8.2* 7.3*  HCT 23.6* 21.9*  PLT 120* 127*   BMET:  Recent Labs    02/10/23 0508 02/11/23 0256  NA 137 136  K 4.1 4.1  CL 101 101  CO2 27 26  GLUCOSE 129* 116*  BUN 17 29*  CREATININE 1.59* 1.63*  CALCIUM  7.8* 7.9*    PT/INR:  Recent Labs    02/08/23 1246  LABPROT 18.2*  INR 1.5*   ABG    Component Value Date/Time   PHART 7.386 02/08/2023 1832   HCO3 20.9 02/08/2023 1832   TCO2 22 02/08/2023 1832   ACIDBASEDEF 4.0 (H) 02/08/2023 1832   O2SAT 96 02/08/2023 1832   CBG  (last 3)  Recent Labs    02/10/23 1713 02/10/23 2054 02/11/23 0541  GLUCAP 113* 98 104*    Assessment/Plan: S/P Procedure(s) (LRB): AORTIC VALVE REPLACEMENT (AVR) USING INSPIRIS AORTIC VALVE SIZE (N/A) TRANSESOPHAGEAL ECHOCARDIOGRAM (N/A)  -POD3 AVR for aortic stenosis with bicuspid aortic valve. BP and cardiac rhythm stable.   -A-fib- episode in OR, started on amiodarone . No further atrial fibrillation post-op. Continuing PO amio loading.   -RENAL- normal function at baseline, creat has trended up after diuresis. Wt is near pre-op and he has no peripheral edema. Will stop Lasix  for now and monitor.   -History of head injury, seizure disorder:  Depakote  and Dilantin  resumed.   -HEME- expected acute blood loss anemia- tolerating OK, add Fe++ replacement. Post-op leukocytosis without signs of infection.  WBC is trending down.   -Disposition- living alone prior to surgery and wants to avoid having to go to SNF after discharge.  [place] he has some friends that live close by and would be willing to check on him. Will request PT / OT consults to evaluate for discharge needs.    LOS: 3 days    Myron G. Roddenberry, [name] [phone] 02/11/2023   Chart reviewed, patient examined, agree with above. He is doing well overall. No BM yet. Does not want to go to SNF so will have to arrange  HHN.

## 2023-02-11 NOTE — Progress Notes (Addendum)
 Mobility Specialist Progress Note:   02/11/23 1100  Mobility  Activity Transferred from chair to bed  Level of Assistance Minimal assist, patient does 75% or more (+2)  Activity Response Tolerated fair  Mobility Referral Yes  Mobility visit 1 Mobility  Mobility Specialist Start Time (ACUTE ONLY) 1050  Mobility Specialist Stop Time (ACUTE ONLY) 1055  Mobility Specialist Time Calculation (min) (ACUTE ONLY) 5 min   Pt received in chair, requesting to return to bed.  MinA +2 to stand and pivot. VSS. Pt left in supine with NT still present in room.   Brown Husband  Mobility Specialist Please contact via [employer] office at [phone]

## 2023-02-11 NOTE — Progress Notes (Signed)
 CARDIAC REHAB PHASE I   PRE:  Rate/Rhythm: 76 SR    BP: sitting 120/65    SpO2: 91 RA  MODE:  Ambulation: 90 ft   POST:  Rate/Rhythm: 95 SR    BP: sitting 128/34     SpO2: 95 RA  Pt cognition seems slow. Slow to process getting out of bed with correct mechanics. Once standing needed to use BSC therefore took steps to side and sat for a few minutes but no BM. Stood with verbal and demonstrative cues. Used RW and gait belt assist x1. Small stiff steps, seemed weaker on left side. Trouble steering at times. Pt immediately fatigued once standing but able to walk 90 ft in hall. C/o fatigue and dizziness, pt pale. To recliner with VSS. Practiced IS, 1700 ml. On chair alarm with call bell.  Needs PT and SNF currently.  [id number]  Aliene Aris BS, ACSM-CEP 02/11/2023 10:01 AM

## 2023-02-11 NOTE — Progress Notes (Signed)
 Pt ambulated x 150 feet with front wheel walker, short choppy steps pt tolerated fair

## 2023-02-12 LAB — GLUCOSE, CAPILLARY
Glucose-Capillary: 109 mg/dL — ABNORMAL HIGH (ref 70–99)
Glucose-Capillary: 96 mg/dL (ref 70–99)
Glucose-Capillary: 101 mg/dL — ABNORMAL HIGH (ref 70–99)
Glucose-Capillary: 94 mg/dL (ref 70–99)

## 2023-02-12 LAB — BASIC METABOLIC PANEL
Anion gap: 14 (ref 5–15)
BUN: 35 mg/dL — ABNORMAL HIGH (ref 6–20)
CO2: 22 mmol/L (ref 22–32)
Calcium: 8.2 mg/dL — ABNORMAL LOW (ref 8.9–10.3)
Chloride: 105 mmol/L (ref 98–111)
Creatinine, Ser: 1.41 mg/dL — ABNORMAL HIGH (ref 0.61–1.24)
GFR, Estimated: 58 mL/min — ABNORMAL LOW (ref >60)
Glucose, Bld: 111 mg/dL — ABNORMAL HIGH (ref 70–99)
Potassium: 3.7 mmol/L (ref 3.5–5.1)
Sodium: 141 mmol/L (ref 135–145)

## 2023-02-12 MED ORDER — LOPERAMIDE HCL 1 MG/7.5ML PO SUSP
2.0000 mg | Freq: Once | ORAL | Status: AC
  Administered 2023-02-12: 2 mg via ORAL
  Filled 2023-02-12: qty 15

## 2023-02-12 MED ORDER — LACTULOSE 10 GM/15ML PO SOLN
20.0000 g | Freq: Once | ORAL | Status: DC
  Filled 2023-02-12: qty 30

## 2023-02-12 NOTE — Progress Notes (Signed)
 Plan of care is reviewed. Pt has been progressing. He is alert and fully oriented x 4, afebrile, stable hemodynamically, NSR on the monitor, HR 60s-80s, normal respiratory efforts, no SOB.  On room air SPO2 at night time 83-88%. O2 NCL 2-3 LPM is given to keep SPO2 above 92-100%.  Pain is well tolerated by Oxycodone  PRN and Tylenol . Sternal incision is dry and clean, no drainage. We clean with Betadine BID. Pacer wires is intact, unused, rolled and protected with dry dressing. Pt is able to rest well. We will continue to monitor.   Wendi Dash, RN

## 2023-02-12 NOTE — NC FL2 (Signed)
 [place]  MEDICAID FL2 LEVEL OF CARE FORM     IDENTIFICATION  Patient Name: Jerry [name] Birthdate: 01/08/1966 Sex: male Admission Date (Current Location): 02/08/2023  [facility] and [place] Number:  [employer] and Address:  [facility] [place], [address], [place], [place] [address]      Provider Number: [id number]  Attending Physician Name and Address:  Lucas Dorise POUR, MD  Relative Name and Phone Number:       Current Level of Care: Hospital Recommended Level of Care: Skilled Nursing Facility Prior Approval Number:    Date Approved/Denied:   PASRR Number: [id number]  Discharge Plan: SNF    Current Diagnoses: Patient Active Problem List   Diagnosis Date Noted   S/P aortic valve replacement with bioprosthetic valve 02/08/2023   ERRONEOUS ENCOUNTER--DISREGARD 01/11/2023   Severe aortic stenosis 12/10/2022   History of craniotomy 11/05/2022   Partial symptomatic epilepsy with complex partial seizures, not intractable, without status epilepticus (HCC) 11/05/2022   Aortic valve stenosis, severe 07/06/2022   Bicuspid aortic valve 07/06/2022   Benign essential microscopic hematuria 06/06/2016   HLD (hyperlipidemia) 06/27/2015   Venous stasis 01/21/2015   Seizure disorder (HCC) 10/07/2013   Head injury     Orientation RESPIRATION BLADDER Height & Weight     Self, Time, Situation, Place  Normal Continent Weight: 242 lb 8.1 oz (110 kg) Height:  5' 11 (180.3 cm)  BEHAVIORAL SYMPTOMS/MOOD NEUROLOGICAL BOWEL NUTRITION STATUS      Continent Diet (please see discharge summary)  AMBULATORY STATUS COMMUNICATION OF NEEDS Skin   Limited Assist Verbally Normal                       Personal Care Assistance Level of Assistance  Bathing, Feeding, Dressing Bathing Assistance: Limited assistance Feeding assistance: Independent Dressing Assistance: Limited assistance     Functional Limitations Info  Sight, Hearing, Speech Sight Info:  Adequate Hearing Info: Adequate Speech Info: Adequate    SPECIAL CARE FACTORS FREQUENCY  PT (By licensed PT), OT (By licensed OT)     PT Frequency: 5x per week OT Frequency: 5x per week            Contractures Contractures Info: Not present    Additional Factors Info  Code Status, Allergies Code Status Info: FULL Allergies Info: NKA           Current Medications (02/12/2023):  This is the current hospital active medication list Current Facility-Administered Medications  Medication Dose Route Frequency Provider Last Rate Last Admin   acetaminophen  (TYLENOL ) tablet 1,000 mg  1,000 mg Oral Q6H Gold, Wayne E, PA-C   1,000 mg at 02/12/23 1240   Or   acetaminophen  (TYLENOL ) 160 MG/5ML solution 1,000 mg  1,000 mg Per Tube Q6H Gold, Wayne E, PA-C       amiodarone  (PACERONE ) tablet 400 mg  400 mg Oral BID Lightfoot, Harrell O, MD   400 mg at 02/12/23 0843   aspirin  EC tablet 325 mg  325 mg Oral Daily Gold, Wayne E, PA-C   325 mg at 02/12/23 9156   Or   aspirin  chewable tablet 324 mg  324 mg Per Tube Daily Gold, Wayne E, PA-C       Chlorhexidine  Gluconate Cloth 2 % PADS 6 each  6 each Topical Daily Bartle, Bryan K, MD   6 each at 02/12/23 0844   divalproex  (DEPAKOTE  ER) 24 hr tablet 1,500 mg  1,500 mg Oral BID Lightfoot, Harrell  O, MD   1,500 mg at 02/12/23 0843   enoxaparin  (LOVENOX ) injection 40 mg  40 mg Subcutaneous QHS Bartle, Bryan K, MD   40 mg at 02/11/23 2134   Fe Fum-Vit C-Vit B12-FA (TRIGELS-F FORTE) capsule 1 capsule  1 capsule Oral BID Roddenberry, Myron G, PA-C   1 capsule at 02/12/23 0843   insulin  aspart (novoLOG ) injection 0-24 Units  0-24 Units Subcutaneous TID PC & HS Lightfoot, Harrell O, MD       lactulose  (CHRONULAC ) 10 GM/15ML solution 20 g  20 g Oral Once Roddenberry, Myron G, PA-C       loperamide  HCl (IMODIUM ) 1 MG/7.5ML suspension 2 mg  2 mg Oral Once Barrett, Erin R, PA-C       metoprolol  tartrate (LOPRESSOR ) injection 2.5-5 mg  2.5-5 mg Intravenous Q2H PRN  Gold, Wayne E, PA-C       metoprolol  tartrate (LOPRESSOR ) tablet 25 mg  25 mg Oral BID Lightfoot, Harrell O, MD   25 mg at 02/12/23 0843   morphine  (PF) 2 MG/ML injection 1-4 mg  1-4 mg Intravenous Q1H PRN Gold, Wayne E, PA-C   4 mg at 02/09/23 0622   ondansetron  (ZOFRAN ) injection 4 mg  4 mg Intravenous Q6H PRN Gold, Wayne E, PA-C       oxyCODONE  (Oxy IR/ROXICODONE ) immediate release tablet 5-10 mg  5-10 mg Oral Q3H PRN Gold, Wayne E, PA-C   10 mg at 02/11/23 2210   pantoprazole  (PROTONIX ) EC tablet 40 mg  40 mg Oral Daily Gold, Wayne E, PA-C   40 mg at 02/12/23 0843   phenytoin  (DILANTIN ) ER capsule 200 mg  200 mg Oral QHS Gold, Wayne E, PA-C   200 mg at 02/11/23 2135   rosuvastatin  (CRESTOR ) tablet 10 mg  10 mg Oral QPM Gold, Wayne E, PA-C   10 mg at 02/11/23 1656   sodium chloride  flush (NS) 0.9 % injection 3 mL  3 mL Intravenous Q12H Gold, Wayne E, PA-C   3 mL at 02/12/23 0844   sodium chloride  flush (NS) 0.9 % injection 3 mL  3 mL Intravenous PRN Gold, Wayne E, PA-C       sodium chloride  flush (NS) 0.9 % injection 3 mL  3 mL Intravenous Q12H Lightfoot, Linnie KIDD, MD   3 mL at 02/12/23 0844   sodium chloride  flush (NS) 0.9 % injection 3 mL  3 mL Intravenous PRN Lightfoot, Harrell O, MD       traMADol  (ULTRAM ) tablet 50-100 mg  50-100 mg Oral Q4H PRN Gold, Wayne E, PA-C   100 mg at 02/10/23 0742     Discharge Medications: Please see discharge summary for a list of discharge medications.  Relevant Imaging Results:  Relevant Lab Results:   Additional Information SSN [id number]  [name], LCSW

## 2023-02-12 NOTE — Anesthesia Postprocedure Evaluation (Addendum)
 Anesthesia Post Note  Patient: Jerry [name]  Procedure(s) Performed: AORTIC VALVE REPLACEMENT (AVR) USING INSPIRIS AORTIC VALVE SIZE (Chest) TRANSESOPHAGEAL ECHOCARDIOGRAM     Patient location during evaluation: ICU Anesthesia Type: General Level of consciousness: sedated Pain management: pain level controlled Vital Signs Assessment: post-procedure vital signs reviewed and stable Respiratory status: patient remains intubated per anesthesia plan Cardiovascular status: stable Postop Assessment: no apparent nausea or vomiting Anesthetic complications: yes   Encounter Notable Events  Notable Event Outcome Phase Comment  Difficult to intubate - unexpected  Intraprocedure Filed from anesthesia note documentation.                  Lamae Fosco

## 2023-02-12 NOTE — TOC Progression Note (Signed)
 Transition of Care [facility]) - Progression Note    Patient Details  Name: Jerry [name] MRN: [id number] Date of Birth: 26-Jan-1966  Transition of Care [facility]) CM/SW Contact  [name], [place] Phone Number: 02/12/2023, 2:12 PM  Clinical Narrative:     Received notice from CM, patient is now agreeable to SNF placement. SNF referrals sent out.   TOC will provide SNF bed offers once available TOC will continue to follow and assist with discharge planning.  Montie Louder, MSW, LCSW Clinical Social Worker    Expected Discharge Plan: Skilled Nursing Facility Barriers to Discharge: Continued Medical Work up  Expected Discharge Plan and Services In-house Referral: Clinical Social Work Discharge Planning Services: CM Consult Post Acute Care Choice: Home Health, Skilled Nursing Facility Living arrangements for the past 2 months: Single Family Home                 DME Arranged: 3-N-1, Walker rolling         HH Arranged: RN, PT, OT           Social Determinants of Health (SDOH) Interventions SDOH Screenings   Food Insecurity: No Food Insecurity (02/09/2023)  Housing: Low Risk  (02/09/2023)  Transportation Needs: No Transportation Needs (02/09/2023)  Utilities: Not At Risk (02/09/2023)  Depression (PHQ2-9): Low Risk  (05/11/2020)  Tobacco Use: Medium Risk (02/09/2023)    Readmission Risk Interventions     No data to display

## 2023-02-12 NOTE — Progress Notes (Signed)
 Mobility Specialist Progress Note:   02/12/23 1643  Mobility  Activity Transferred to/from [facility]  Level of Assistance Contact guard assist, steadying assist  Assistive Device None  Distance Ambulated (ft) 3 ft  Activity Response Tolerated well  Mobility Referral Yes  Mobility visit 1 Mobility  Mobility Specialist Start Time (ACUTE ONLY) 1635  Mobility Specialist Stop Time (ACUTE ONLY) 1644  Mobility Specialist Time Calculation (min) (ACUTE ONLY) 9 min   Pt received in bed, requesting assistance to BR. Pt needing 2x attempts to stand with CG assist. Pt left on BSC with call bell in reach and all needs met. NT notified.   Brown Husband  Mobility Specialist Please contact via [employer] office at [phone]

## 2023-02-12 NOTE — TOC Progression Note (Signed)
 Transition of Care (TOC) - Progression Note  Rayfield Gobble RN, BSN Transitions of Care Unit 4E- RN Case Manager See Treatment Team for direct phone #   Patient Details  Name: Jerry [name] MRN: [id number] Date of Birth: Sep 08, 1965  Transition of Care [facility]) CM/SW Contact  Gobble, Rayfield Hurst, RN Phone Number: 02/12/2023, 11:42 AM  Clinical Narrative:    Cm spoke with pt at bedside to follow up on transition plans, noted orders for [facility] and DME.  List provided to pt for [facility] choice Per CMS guidelines from phonefinancing.pl website with star ratings (copy placed in shadow chart) explained to pt [facility] services and how they are intermittent and not daily services. Per discussion with pt- reviewed therapy recommendations for SNF or 24hr supervision at home. Per pt he has friends that can look in on him but not offer 24hr assistance. Pt voiced that his friends live close by and may be able to check in on him once a day. Pt voiced the he is worried if he falls he may end up laying on the floor and not being able to call 911.  Pt also voiced that his mother went to a skilled facility and passed which he says is way he does not want to go to a SNF- explained to pt that he would not be going long term- only short term for rehab and to recover from surgery so he can safely return home.  Pt asked if he could select facility- CM assured pt he would be provided a list to choose from bed offers. Pt voiced he is agreeable to SNF now and would like to review SNF bed offers.   CM updated CSW, who will f/u with pt for possible SNF placement.    Expected Discharge Plan: Skilled Nursing Facility Barriers to Discharge: Continued Medical Work up  Expected Discharge Plan and Services In-house Referral: Clinical Social Work Discharge Planning Services: CM Consult Post Acute Care Choice: Home Health, Skilled Nursing Facility Living arrangements for the past 2 months: Single Family Home                 DME  Arranged: 3-N-1, Walker rolling         HH Arranged: RN, PT, OT           Social Determinants of Health (SDOH) Interventions SDOH Screenings   Food Insecurity: No Food Insecurity (02/09/2023)  Housing: Low Risk  (02/09/2023)  Transportation Needs: No Transportation Needs (02/09/2023)  Utilities: Not At Risk (02/09/2023)  Depression (PHQ2-9): Low Risk  (05/11/2020)  Tobacco Use: Medium Risk (02/09/2023)    Readmission Risk Interventions     No data to display

## 2023-02-12 NOTE — Progress Notes (Addendum)
      [address]       [employer] [address]             [phone]      4 Days Post-Op Procedure(s) (LRB): AORTIC VALVE REPLACEMENT (AVR) USING INSPIRIS AORTIC VALVE SIZE (N/A) TRANSESOPHAGEAL ECHOCARDIOGRAM (N/A) Subjective:  Sitting up in bed, frustrated with the process of ordering breakfast on the phone.  On O2 at 3Lnc No BM yet.    Objective: Vital signs in last 24 hours: Temp:  [97.7 F (36.5 C)-99 F (37.2 C)] 98.1 F (36.7 C) (01/07 0902) Pulse Rate:  [63-85] 82 (01/07 0902) Cardiac Rhythm: Normal sinus rhythm (01/07 0711) Resp:  [13-21] 17 (01/07 0350) BP: (97-150)/(53-74) 150/74 (01/07 0902) SpO2:  [83 %-100 %] 96 % (01/07 0350) Weight:  [110 kg] 110 kg (01/07 0456)     Intake/Output from previous day: 01/06 0701 - 01/07 0700 In: 250 [P.O.:250] Out: 450 [Urine:450] Intake/Output this shift: No intake/output data recorded.  General appearance: alert, cooperative, and mild distress Neurologic: slow responses to questions, no focal deficits.  Heart: RRR, monitor showing stable SR. Lungs: breath sounds full and clear.  Abdomen: firm, no tenderness Extremities: no peripheral edema Wound: the sternotomy incision is open to air and is intact and dry.   Lab Results: Recent Labs    02/10/23 0508 02/11/23 0256  WBC 21.0* 17.2*  HGB 8.2* 7.3*  HCT 23.6* 21.9*  PLT 120* 127*   BMET:  Recent Labs    02/11/23 0256 02/12/23 0359  NA 136 141  K 4.1 3.7  CL 101 105  CO2 26 22  GLUCOSE 116* 111*  BUN 29* 35*  CREATININE 1.63* 1.41*  CALCIUM  7.9* 8.2*    PT/INR:  No results for input(s): LABPROT, INR in the last 72 hours.  ABG    Component Value Date/Time   PHART 7.386 02/08/2023 1832   HCO3 20.9 02/08/2023 1832   TCO2 22 02/08/2023 1832   ACIDBASEDEF 4.0 (H) 02/08/2023 1832   O2SAT 96 02/08/2023 1832   CBG (last 3)  Recent Labs    02/11/23 1648 02/11/23 2051 02/12/23 0555  GLUCAP 95 88 109*     Assessment/Plan: S/P Procedure(s) (LRB): AORTIC VALVE REPLACEMENT (AVR) USING INSPIRIS AORTIC VALVE SIZE (N/A) TRANSESOPHAGEAL ECHOCARDIOGRAM (N/A)  -POD4 AVR for aortic stenosis with bicuspid aortic valve. BP and cardiac rhythm stable.   -A-fib- episode in OR, started on amiodarone . No further atrial fibrillation post-op. Continuing PO amio loading.   -RENAL- normal function at baseline, creat trending back down after stopping the diuretic yesterday. Jerry [name] is near pre-op and he has no peripheral edema. No further diuretic for now.   -History of head injury, seizure disorder:  Depakote  and Dilantin  resumed.   -HEME- expected acute blood loss anemia- tolerating OK, added Fe++ replacement yesterday. Post-op leukocytosis without signs of infection.  WBC is trending down.   -GI- tolerating POs but no BM yet. Ordered lactulose .   -Disposition- living alone prior to surgery and wants to avoid having to go to SNF after discharge.  [place] he has some friends that live close by and would be willing to check on him. Appreciate input from PT / OT.  Will arrange for [facility], rolling walker, and BSC as recommended.    LOS: 4 days    Jerry [name] [phone] 02/12/2023   Chart reviewed, patient examined, agree with above.  He is progressing well overall. Ambulating without difficulty.

## 2023-02-12 NOTE — Progress Notes (Addendum)
 Mobility Specialist Progress Note:   02/12/23 1107  Mobility  Activity Ambulated with assistance in hallway  Level of Assistance Contact guard assist, steadying assist  Assistive Device Front wheel walker  Distance Ambulated (ft) 160 ft  Activity Response Tolerated well  Mobility Referral Yes  Mobility visit 1 Mobility  Mobility Specialist Start Time (ACUTE ONLY) 1000  Mobility Specialist Stop Time (ACUTE ONLY) 1015  Mobility Specialist Time Calculation (min) (ACUTE ONLY) 15 min   Pt received in bed, agreeable to mobility. Cognition seems slow. Pt c/o hallucinations stating he see's people and cockroaches at night. Pt blaming hallucinations on his meds. Pt needing MinA for bed mobility. CG to stand. Verbal cues required to remind pt to walk within RW. Pt was able to walk short distance in hallway with slow steps and CG for safety. VSS. Pt returned to bed with call bell in reach and all needs met.  Brown Husband  Mobility Specialist Please contact via [employer] office at [phone]

## 2023-02-13 ENCOUNTER — Telehealth: Payer: Self-pay

## 2023-02-13 LAB — GLUCOSE, CAPILLARY
Glucose-Capillary: 118 mg/dL — ABNORMAL HIGH (ref 70–99)
Glucose-Capillary: 119 mg/dL — ABNORMAL HIGH (ref 70–99)
Glucose-Capillary: 106 mg/dL — ABNORMAL HIGH (ref 70–99)
Glucose-Capillary: 170 mg/dL — ABNORMAL HIGH (ref 70–99)

## 2023-02-13 NOTE — Telephone Encounter (Signed)
 STD form completed and faxed to [facility] @ [phone]. Beginning LOA 02/08/23 through 05/06/23./ DOS 02/08/23.

## 2023-02-13 NOTE — Progress Notes (Signed)
 Mobility Specialist Progress Note:   02/13/23 1100  Mobility  Activity Ambulated with assistance in hallway  Level of Assistance Contact guard assist, steadying assist  Assistive Device Front wheel walker  Distance Ambulated (ft) 200 ft  Activity Response Tolerated well  Mobility Referral Yes  Mobility visit 1 Mobility  Mobility Specialist Start Time (ACUTE ONLY) 1023  Mobility Specialist Stop Time (ACUTE ONLY) 1040  Mobility Specialist Time Calculation (min) (ACUTE ONLY) 17 min   Pt received in bed, agreeable to mobility. CG to stand and ambulate in hallway with RW. Pt displayed improve gait speed and stability. Pt denied any discomfort, asx throughout. VSS. Pt left in chair with call bell in reach and all needs met.   Jerry [name]  Mobility Specialist Please contact via [employer] office at [phone]

## 2023-02-13 NOTE — Progress Notes (Addendum)
      [address]       [place] [address]             [phone]      5 Days Post-Op Procedure(s) (LRB): AORTIC VALVE REPLACEMENT (AVR) USING INSPIRIS AORTIC VALVE SIZE (N/A) TRANSESOPHAGEAL ECHOCARDIOGRAM (N/A) Subjective:  No new concerns. Jerry [name] has agreed to pursue short-term SNF placement for further rehab before returning home.   Walked a short distance in the hall yesterday.   BM yesterday.  Now on RA.      Objective: Vital signs in last 24 hours: Temp:  [97.7 F (36.5 C)-98.4 F (36.9 C)] 98.2 F (36.8 C) (01/08 0308) Pulse Rate:  [65-82] 65 (01/08 0308) Cardiac Rhythm: Normal sinus rhythm (01/08 0406) Resp:  [14-20] 17 (01/08 0308) BP: (107-150)/(57-80) 116/63 (01/08 0308) SpO2:  [94 %-99 %] 95 % (01/08 0308) Weight:  [890 kg] 109 kg (01/08 0539)     Intake/Output from previous day: 01/07 0701 - 01/08 0700 In: 250 [P.O.:250] Out: 1550 [Urine:1550] Intake/Output this shift: No intake/output data recorded.  General appearance: alert, cooperative, and mild distress Neurologic: slow responses to questions, no focal deficits.  Heart: RRR, monitor showing stable SR. Lungs: breath sounds full and clear.  Abdomen: soft, no tenderness Extremities: no peripheral edema Wound: the sternotomy incision is open to air and is intact and dry.   Lab Results: Recent Labs    02/11/23 0256  WBC 17.2*  HGB 7.3*  HCT 21.9*  PLT 127*   BMET:  Recent Labs    02/11/23 0256 02/12/23 0359  NA 136 141  K 4.1 3.7  CL 101 105  CO2 26 22  GLUCOSE 116* 111*  BUN 29* 35*  CREATININE 1.63* 1.41*  CALCIUM  7.9* 8.2*    PT/INR:  No results for input(s): LABPROT, INR in the last 72 hours.  ABG    Component Value Date/Time   PHART 7.386 02/08/2023 1832   HCO3 20.9 02/08/2023 1832   TCO2 22 02/08/2023 1832   ACIDBASEDEF 4.0 (H) 02/08/2023 1832   O2SAT 96 02/08/2023 1832   CBG (last 3)  Recent Labs    02/12/23 1730  02/12/23 2119 02/13/23 0603  GLUCAP 101* 94 118*    Assessment/Plan: S/P Procedure(s) (LRB): AORTIC VALVE REPLACEMENT (AVR) USING INSPIRIS AORTIC VALVE SIZE (N/A) TRANSESOPHAGEAL ECHOCARDIOGRAM (N/A)  -POD5 AVR for aortic stenosis with bicuspid aortic valve. BP and cardiac rhythm stable.   -A-fib- episode in OR, started on amiodarone . No further atrial fibrillation post-op. Continuing PO amio loading.   -RENAL- normal function at baseline, creat trending back down after stopping the diuretic yesterday. [name] is near pre-op and he has no peripheral edema. No further diuretic for now.   -History of head injury, seizure disorder:  Depakote  and Dilantin  resumed.   -HEME- expected acute blood loss anemia- tolerating OK, added Fe++ replacement yesterday. Post-op leukocytosis without signs of infection.    -GI- tolerating POs , BM yesterday after lactulose   -Disposition- living alone prior to surgery.  PT /OT recommended short-term SNF prior to returning home and Jerry [name] has now agreed to this plan. Bed search in progress.    LOS: 5 days    Jerry [name], Jerry [name] [phone] 02/13/2023   Chart reviewed, patient examined, agree with above.  He is doing well overall. Wt back to baseline. He needs to continue ambulating and use IS. I think he can go to SNF when bed available.

## 2023-02-13 NOTE — Progress Notes (Signed)
 Physical Therapy Treatment Patient Details Name: Jerry [name] MRN: [id number] DOB: April 22, 1965 Today's Date: 02/13/2023   History of Present Illness 58 yo male admitted 1/2 with severe AS. 1/3 AVR. 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 Pt with impaired cognition at baseline as pt with history of TBI. Pt continues to be unsafe to return home alone however has improved functionally and is requiring less physical assist. If 24/7 assist is available pt could return home with use of RW however if returning home alone pt to benefit from inpatient rehab program < 3 hrs a day to allow for increased time to heal under supervision and aide in progression to independence.    If plan is discharge home, recommend the following: A little help with walking and/or transfers;A little help with bathing/dressing/bathroom;Assistance with cooking/housework;Direct supervision/assist for financial management;Assist for transportation;Supervision due to cognitive status   Can travel by private vehicle     Yes  Equipment Recommendations  Rolling walker (2 wheels);BSC/3in1    Recommendations for Other Services       Precautions / Restrictions Precautions Precautions: Sternal;Fall Precaution Booklet Issued: No Precaution Comments: reviewed sternal precautions Restrictions Weight Bearing Restrictions Per Provider Order: Yes (sternal precautions)     Mobility  Bed Mobility Overal bed mobility: Needs Assistance Bed Mobility: Rolling, Sidelying to Sit, Sit to Sidelying Rolling: Min assist Sidelying to sit: Min assist     Sit to sidelying: Contact guard assist General bed mobility comments: min A to elevate trunk, held onto heart pillow, HOB elevated, verbal cues to adhere to sternal precautions when getting back into bed    Transfers Overall transfer level: Needs assistance   Transfers: Sit to/from Stand Sit to Stand: Contact  guard assist           General transfer comment: cues for hand placement, sequence and rise from bed and return to chair    Ambulation/Gait Ambulation/Gait assistance: Contact guard assist Gait Distance (Feet): 200 Feet Assistive device: Rolling walker (2 wheels) Gait Pattern/deviations: Step-through pattern, Decreased stride length   Gait velocity interpretation: 1.31 - 2.62 ft/sec, indicative of limited community ambulator   General Gait Details: RW adjusted for optimal height, pt with improved ability to maintain upright posture, verbal cues to minimize dependence on RW   Stairs             Wheelchair Mobility     Tilt Bed    Modified Rankin (Stroke Patients Only)       Balance Overall balance assessment: Needs assistance Sitting-balance support: Feet supported, No upper extremity supported Sitting balance-Leahy Scale: Good     Standing balance support: Bilateral upper extremity supported, Reliant on assistive device for balance Standing balance-Leahy Scale: Poor Standing balance comment: RW in standing                            Cognition Arousal: Alert Behavior During Therapy: Flat affect Overall Cognitive Status: No family/caregiver present to determine baseline cognitive functioning                                 General Comments: pt with delayed processing however suspect that may be baseline due to h/o TBI. Pt also with hallucinations, stating I just saw something run across the floor        Exercises  General Comments        Pertinent Vitals/Pain Pain Assessment Pain Assessment: No/denies pain    Home Living                          Prior Function            PT Goals (current goals can now be found in the care plan section) Acute Rehab PT Goals Patient Stated Goal: return home PT Goal Formulation: With patient Time For Goal Achievement: 02/25/23 Potential to Achieve Goals:  Good Progress towards PT goals: Progressing toward goals    Frequency    Min 1X/week      PT Plan      Co-evaluation              AM-PAC PT 6 Clicks Mobility   Outcome Measure  Help needed turning from your back to your side while in a flat bed without using bedrails?: A Little Help needed moving from lying on your back to sitting on the side of a flat bed without using bedrails?: A Little Help needed moving to and from a bed to a chair (including a wheelchair)?: A Little Help needed standing up from a chair using your arms (e.g., wheelchair or bedside chair)?: A Little Help needed to walk in hospital room?: A Little Help needed climbing 3-5 steps with a railing? : A Lot 6 Click Score: 17    End of Session Equipment Utilized During Treatment: Gait belt Activity Tolerance: Patient tolerated treatment well Patient left: with call bell/phone within reach;in bed;with bed alarm set Nurse Communication: Mobility status;Precautions PT Visit Diagnosis: Other abnormalities of gait and mobility (R26.89);Difficulty in walking, not elsewhere classified (R26.2)     Time: 8776-8749 PT Time Calculation (min) (ACUTE ONLY): 27 min  Charges:    $Gait Training: 23-37 mins PT General Charges $$ ACUTE PT VISIT: 1 Visit                     Norene Ames, PT, DPT Acute Rehabilitation Services Secure chat preferred Office #: [phone]    Norene CHRISTELLA Ames 02/13/2023, 1:31 PM

## 2023-02-14 ENCOUNTER — Inpatient Hospital Stay (HOSPITAL_COMMUNITY): Payer: BC Managed Care – PPO

## 2023-02-14 LAB — BASIC METABOLIC PANEL
Anion gap: 8 (ref 5–15)
BUN: 19 mg/dL (ref 6–20)
CO2: 29 mmol/L (ref 22–32)
Calcium: 8.7 mg/dL — ABNORMAL LOW (ref 8.9–10.3)
Chloride: 105 mmol/L (ref 98–111)
Creatinine, Ser: 1.21 mg/dL (ref 0.61–1.24)
GFR, Estimated: >60 mL/min (ref >60)
Glucose, Bld: 103 mg/dL — ABNORMAL HIGH (ref 70–99)
Potassium: 3.5 mmol/L (ref 3.5–5.1)
Sodium: 142 mmol/L (ref 135–145)

## 2023-02-14 LAB — CBC
HCT: 23.6 % — ABNORMAL LOW (ref 39.0–52.0)
Hemoglobin: 7.9 g/dL — ABNORMAL LOW (ref 13.0–17.0)
MCH: 34.3 pg — ABNORMAL HIGH (ref 26.0–34.0)
MCHC: 33.5 g/dL (ref 30.0–36.0)
MCV: 102.6 fL — ABNORMAL HIGH (ref 80.0–100.0)
Platelets: 208 10*3/uL (ref 150–400)
RBC: 2.3 MIL/uL — ABNORMAL LOW (ref 4.22–5.81)
RDW: 15.7 % — ABNORMAL HIGH (ref 11.5–15.5)
WBC: 11.6 10*3/uL — ABNORMAL HIGH (ref 4.0–10.5)
nRBC: 3.2 % — ABNORMAL HIGH (ref 0.0–0.2)

## 2023-02-14 LAB — GLUCOSE, CAPILLARY: Glucose-Capillary: 96 mg/dL (ref 70–99)

## 2023-02-14 MED ORDER — AMLODIPINE BESYLATE 5 MG PO TABS
5.0000 mg | ORAL_TABLET | Freq: Every day | ORAL | Status: DC
  Administered 2023-02-14 – 2023-02-18 (×5): 5 mg via ORAL
  Filled 2023-02-14 (×5): qty 1

## 2023-02-14 NOTE — Progress Notes (Signed)
 Mobility Specialist Progress Note:   02/14/23 1405  Mobility  Activity Ambulated with assistance in hallway  Level of Assistance Contact guard assist, steadying assist  Assistive Device Front wheel walker  Distance Ambulated (ft) 200 ft  Activity Response Tolerated well  Mobility Referral Yes  Mobility visit 1 Mobility  Mobility Specialist Start Time (ACUTE ONLY) 1405  Mobility Specialist Stop Time (ACUTE ONLY) 1426  Mobility Specialist Time Calculation (min) (ACUTE ONLY) 21 min   Pt agreeable to mobility session. Required only minG assist to stand and ambulate with RW. HR up to 150 Afib at EOS, not recovering with rest (130s). RN notified. Pt back in bed with all needs met, alarm on.   Therisa Rana Mobility Specialist Please contact via SecureChat or  Rehab office at [phone]

## 2023-02-14 NOTE — TOC Progression Note (Signed)
 Transition of Care [facility]) - Progression Note    Patient Details  Name: Jerry [name] MRN: [id number] Date of Birth: 1965/09/11  [facility] of [facility] CM/SW Contact  [name], [place] Phone Number: 02/14/2023, 12:09 PM  Clinical Narrative:     CSW met  with patient at bedside. Patient remains agreeable to short term rehab at [facility]. CSW informed patient of bed offers. Patient decided on [facility]. CSW explained will need auth before he is d/c to SNF. Patient states understanding.  [facility] confirmed availability and will start authorization today.   TOC will continue to follow and assist with discharge planning.  Montie Louder, MSW, LCSW Clinical Social Worker    Expected Discharge Plan: Skilled Nursing Facility Barriers to Discharge: Continued Medical Work up  Expected Discharge Plan and Services In-house Referral: Clinical Social Work Discharge Planning Services: CM Consult Post Acute Care Choice: Home Health, Skilled Nursing Facility Living arrangements for the past 2 months: Single Family Home                 DME Arranged: 3-N-1, Walker rolling         HH Arranged: RN, PT, OT           Social Determinants of Health (SDOH) Interventions SDOH Screenings   Food Insecurity: No Food Insecurity (02/09/2023)  Housing: Low Risk  (02/09/2023)  Transportation Needs: No Transportation Needs (02/09/2023)  Utilities: Not At Risk (02/09/2023)  Depression (PHQ2-9): Low Risk  (05/11/2020)  Tobacco Use: Medium Risk (02/09/2023)    Readmission Risk Interventions     No data to display

## 2023-02-14 NOTE — Progress Notes (Addendum)
      [address]       [place] [address]             [phone]      6 Days Post-Op Procedure(s) (LRB): AORTIC VALVE REPLACEMENT (AVR) USING INSPIRIS AORTIC VALVE SIZE (N/A) TRANSESOPHAGEAL ECHOCARDIOGRAM (N/A) Subjective:   Resting in bed, awakens easily. No new concerns.    Walked 200' in the hall yesterday.   Remains on RA  Objective: Vital signs in last 24 hours: Temp:  [97.6 F (36.4 C)-98.3 F (36.8 C)] 97.6 F (36.4 C) (01/09 0233) Pulse Rate:  [67-82] 73 (01/09 0233) Cardiac Rhythm: Normal sinus rhythm (01/08 1900) Resp:  [14-20] 20 (01/09 0233) BP: (113-165)/(61-80) 148/71 (01/09 0233) SpO2:  [93 %-100 %] 93 % (01/09 0233) Weight:  [108.6 kg] 108.6 kg (01/09 0500)     Intake/Output from previous day: 01/08 0701 - 01/09 0700 In: -  Out: 850 [Urine:850] Intake/Output this shift: No intake/output data recorded.  General appearance: alert, cooperative, and mild distress Neurologic: mental status appropriate, no focal deficits.  Heart: RRR, monitor showing stable SR with no significant arrhythmias. Lungs:normal work of breathing on RA. CXR showing low lung volumes but lung fields are clear, no significant effusions. Abdomen: soft, no tenderness Extremities: no peripheral edema Wound: the sternotomy incision is open to air and is intact and dry.   Lab Results: Recent Labs    02/14/23 0321  WBC 11.6*  HGB 7.9*  HCT 23.6*  PLT 208   BMET:  Recent Labs    02/12/23 0359 02/14/23 0321  NA 141 142  K 3.7 3.5  CL 105 105  CO2 22 29  GLUCOSE 111* 103*  BUN 35* 19  CREATININE 1.41* 1.21  CALCIUM  8.2* 8.7*    PT/INR:  No results for input(s): LABPROT, INR in the last 72 hours.  ABG    Component Value Date/Time   PHART 7.386 02/08/2023 1832   HCO3 20.9 02/08/2023 1832   TCO2 22 02/08/2023 1832   ACIDBASEDEF 4.0 (H) 02/08/2023 1832   O2SAT 96 02/08/2023 1832   CBG (last 3)  Recent Labs    02/13/23 1627  02/13/23 2043 02/14/23 0549  GLUCAP 106* 170* 96    Assessment/Plan: S/P Procedure(s) (LRB): AORTIC VALVE REPLACEMENT (AVR) USING INSPIRIS AORTIC VALVE SIZE (N/A) TRANSESOPHAGEAL ECHOCARDIOGRAM (N/A)  -POD6 AVR for aortic stenosis with bicuspid aortic valve. BP trending up with MAP ~14mmHg. Will add amlodipine  as he was taking at home.  Cardiac rhythm stable.   -A-fib- episode in OR, started on amiodarone  with ~5gm load so far. No further atrial fibrillation post-op. Continuing PO amio loading but taper dose at discharge.   -RENAL- normal function at baseline, creat trending back down. Wt is near pre-op and he has no peripheral edema. No further diuretic for now.   -History of head injury, seizure disorder:  Depakote  and Dilantin  resumed.   -HEME- expected acute blood loss anemia- tolerating OK, added Fe++ replacement. Post-op leukocytosis resolving.    -GI- tolerating POs, having bowel function.   -Disposition- living alone prior to surgery.  PT /OT recommended short-term SNF prior to returning home and Mr. Mcmillen has now agreed to this plan. Medically stable for transfer when SNF bed available.    LOS: 6 days    Myron G. Roddenberry, [name] [phone] 02/14/2023   Chart reviewed, patient examined, agree with above.  Waiting on approval for SNF.

## 2023-02-14 NOTE — Progress Notes (Signed)
 Occupational Therapy Treatment Patient Details Name: Jerry [name] MRN: [id number] DOB: January 26, 1966 Today's Date: 02/14/2023   History of present illness 58 yo male admitted 1/2 with severe AS. 1/3 AVR. 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 Pt with overall good carryover of training in sternal precautions from last session, especially during bed mobility and functional transfers/mobility with a RW, with additional training provided by OT this session in compensatory techniques for dressing and toileting while staying in the tube. Pt verbalized and demonstrated understanding through teach back and will benefit from reinforcement of education. Pt currently demonstrates ability to complete UB dressing with Contact guard assist, LB dressing with Mod assist, toileting hygiene/clothing management with Min assist all with cues while adhering to sternal precautions. Pt also demonstrates ability to complete bed mobility and functional transfers/mobility with a RW with Contact guard assist with cues while adhering to sternal precautions. Pt's HR in the mid-60s to mid-80s and O2 sat >/93% on RA throughout session. Pt participated well and is making progress toward goals. Pt will benefit from continued acute skilled OT services to address deficits outlined below and increase safety and independence with functional tasks. Post acute discharge, pt will benefit from intensive inpatient skilled rehab services < 3 hours per day to maximize rehab potential.       If plan is discharge home, recommend the following:  A little help with walking and/or transfers;A lot of help with bathing/dressing/bathroom;Assistance with cooking/housework;Direct supervision/assist for medications management;Direct supervision/assist for financial  management;Assist for transportation;Help with stairs or ramp for entrance;Supervision due to cognitive status   Equipment Recommendations  Other (comment) (defer to next level of care)    Recommendations for Other Services      Precautions / Restrictions Precautions Precautions: Sternal;Fall Precaution Booklet Issued: No Precaution Comments: reviewed sternal precautions Restrictions Weight Bearing Restrictions Per Provider Order: Yes Other Position/Activity Restrictions: sternal precautions       Mobility Bed Mobility Overal bed mobility: Needs Assistance Bed Mobility: Sidelying to Sit, Sit to Sidelying   Sidelying to sit: Contact guard assist, HOB elevated (cues for technique)     Sit to sidelying: Contact guard assist General bed mobility comments: while holding heart pillow; assist to elevate trunk to get to sitting; assist to elevate B LE when going to sidelying    Transfers Overall transfer level: Needs assistance Equipment used: Rolling walker (2 wheels) Transfers: Sit to/from Stand, Bed to chair/wheelchair/BSC Sit to Stand: Contact guard assist     Step pivot transfers: Contact guard assist (with RW)     General transfer comment: cues for hand placement/techniqu, safety,e and sequencing     Balance Overall balance assessment: Needs assistance Sitting-balance support: No upper extremity supported, Feet supported Sitting balance-Leahy Scale: Good     Standing balance support: Single extremity supported, Bilateral upper extremity supported, During functional activity, Reliant on assistive device for balance Standing balance-Leahy Scale: Poor Standing balance comment: Requires UE support for balance                           ADL either performed or assessed with clinical judgement   ADL Overall ADL's : Needs assistance/impaired                 Upper Body Dressing : Contact guard assist;Sitting Upper Body  Dressing Details (indicate cue  type and reason): occasional cues for staying in the tube Lower Body Dressing: Moderate assistance;Sit to/from stand;Cueing for compensatory techniques   Toilet Transfer: Contact guard assist;Ambulation;BSC/3in1;Cueing for safety   Toileting- Clothing Manipulation and Hygiene: Minimal assistance;Cueing for compensatory techniques;Sit to/from stand Toileting - Clothing Manipulation Details (indicate cue type and reason): simulated     Functional mobility during ADLs: Contact guard assist;Cueing for safety;Rolling walker (2 wheels)      Extremity/Trunk Assessment Upper Extremity Assessment Upper Extremity Assessment: Generalized weakness (within sternal precaution)   Lower Extremity Assessment Lower Extremity Assessment: Defer to PT evaluation        Vision       Perception     Praxis      Cognition Arousal: Alert Behavior During Therapy: Flat affect Overall Cognitive Status: No family/caregiver present to determine baseline cognitive functioning Area of Impairment: Orientation, Attention, Memory, Following commands, Safety/judgement, Problem solving                 Orientation Level: Disoriented to, Time Current Attention Level: Sustained Memory: Decreased short-term memory (Pt with overall good carryover of prior education in sternal precautions with minimal cues needed regarding move in the tub techniques) Following Commands: Follows one step commands consistently, Follows one step commands with increased time Safety/Judgement: Decreased awareness of safety, Decreased awareness of deficits   Problem Solving: Slow processing, Decreased initiation, Requires verbal cues General Comments: No caregiver/family present to confirm baseline. OT suspects pt at or near baseline coginitive level given hx of TBI.        Exercises      Shoulder Instructions       General Comments Pt with HR in the mid 60s to mid 80s and O2 sat >/93% on RA throughout session.     Pertinent Vitals/ Pain       Pain Assessment Pain Assessment: Faces Faces Pain Scale: Hurts a little bit Pain Location: chest at incision site Pain Descriptors / Indicators: Sore, Guarding, Grimacing Pain Intervention(s): Limited activity within patient's tolerance, Monitored during session, Repositioned  Home Living                                          Prior Functioning/Environment              Frequency  Min 1X/week        Progress Toward Goals  OT Goals(current goals can now be found in the care plan section)  Progress towards OT goals: Progressing toward goals  Acute Rehab OT Goals Patient Stated Goal: to be independent and return home  Plan      Co-evaluation                 AM-PAC OT 6 Clicks Daily Activity     Outcome Measure   Help from another person eating meals?: None Help from another person taking care of personal grooming?: A Little Help from another person toileting, which includes using toliet, bedpan, or urinal?: A Lot Help from another person bathing (including washing, rinsing, drying)?: A Lot Help from another person to put on and taking off regular upper body clothing?: A Little Help from another person to put on and taking off regular lower body clothing?: A Lot 6 Click Score: 16    End of Session Equipment Utilized During Treatment: Gait belt;Rolling walker (2 wheels)  OT Visit Diagnosis: Unsteadiness  on feet (R26.81);Other abnormalities of gait and mobility (R26.89);Muscle weakness (generalized) (M62.81);Other (comment) (decreased activity tolerance)   Activity Tolerance Patient tolerated treatment well   Patient Left in bed;with call bell/phone within reach;with bed alarm set   Nurse Communication Mobility status;Other (comment) (vital signs)        Time: 8395-8370 OT Time Calculation (min): 25 min  Charges: OT General Charges $OT Visit: 1 Visit OT Treatments $Self Care/Home Management :  23-37 mins  Margarie Rockey HERO., OTR/L, MA Acute Rehab [phone]   Margarie FORBES Horns 02/14/2023, 5:32 PM

## 2023-02-15 LAB — GLUCOSE, CAPILLARY
Glucose-Capillary: 114 mg/dL — ABNORMAL HIGH (ref 70–99)
Glucose-Capillary: 96 mg/dL (ref 70–99)

## 2023-02-15 MED ORDER — FE FUM-VIT C-VIT B12-FA 460-60-0.01-1 MG PO CAPS: 1.0000 | ORAL_CAPSULE | Freq: Two times a day (BID) | ORAL | Status: DC

## 2023-02-15 MED ORDER — AMIODARONE HCL 200 MG PO TABS: 200.0000 mg | ORAL_TABLET | Freq: Every day | ORAL | Status: DC

## 2023-02-15 MED ORDER — AMIODARONE HCL 200 MG PO TABS
200.0000 mg | ORAL_TABLET | Freq: Two times a day (BID) | ORAL | Status: DC
  Administered 2023-02-15 – 2023-02-19 (×10): 200 mg via ORAL
  Filled 2023-02-15 (×12): qty 1

## 2023-02-15 MED ORDER — METOPROLOL TARTRATE 25 MG PO TABS: 25.0000 mg | ORAL_TABLET | Freq: Two times a day (BID) | ORAL | Status: DC

## 2023-02-15 MED ORDER — TRAMADOL HCL 50 MG PO TABS: 50.0000 mg | ORAL_TABLET | Freq: Four times a day (QID) | ORAL | 0 refills | Status: DC | PRN

## 2023-02-15 MED ORDER — ASPIRIN 325 MG PO TBEC: 325.0000 mg | DELAYED_RELEASE_TABLET | Freq: Every day | ORAL | Status: DC

## 2023-02-15 NOTE — Progress Notes (Signed)
 CARDIAC REHAB PHASE I    Pt sitting in chair, feeling well today. Assisted with breakfast setup. Pt would like to walk after he eats. Post OHS education including site care, restrictions, heart healthy diet, sternal precautions, IS use at home, exercise guidelines and CRP2 reviewed. All questions and concerns addressed. Will refer to AP for CRP2.  Plan for discharge to SNF.   [id number] Vaughn Asberry Hacking, RN BSN 02/15/2023 9:20 AM

## 2023-02-15 NOTE — Progress Notes (Signed)
 7 Days Post-Op Procedure(s) (LRB): AORTIC VALVE REPLACEMENT (AVR) USING INSPIRIS AORTIC VALVE SIZE (N/A) TRANSESOPHAGEAL ECHOCARDIOGRAM (N/A) Subjective:  Had a lot of lower abdominal gas pain overnight. Went to bathroom twice but nothing came out.   Objective: Vital signs in last 24 hours: Temp:  [97.7 F (36.5 C)-98.2 F (36.8 C)] 97.8 F (36.6 C) (01/10 0437) Pulse Rate:  [68-77] 76 (01/10 0437) Cardiac Rhythm: Normal sinus rhythm (01/09 1900) Resp:  [12-20] 12 (01/10 0437) BP: (120-137)/(70-74) 137/72 (01/10 0437) SpO2:  [96 %-100 %] 97 % (01/10 0437) Weight:  [107.4 kg] 107.4 kg (01/10 0437)  Hemodynamic parameters for last 24 hours:    Intake/Output from previous day: 01/09 0701 - 01/10 0700 In: 360 [P.O.:360] Out: 700 [Urine:700] Intake/Output this shift: No intake/output data recorded.  General appearance: alert and cooperative Neurologic: intact Heart: regular rate and rhythm Lungs: clear to auscultation bilaterally Abdomen: soft, non-tender; bowel sounds normal Extremities: no edema Wound: incision healing well  Lab Results: Recent Labs    02/14/23 0321  WBC 11.6*  HGB 7.9*  HCT 23.6*  PLT 208   BMET:  Recent Labs    02/14/23 0321  NA 142  K 3.5  CL 105  CO2 29  GLUCOSE 103*  BUN 19  CREATININE 1.21  CALCIUM  8.7*    PT/INR: No results for input(s): LABPROT, INR in the last 72 hours. ABG    Component Value Date/Time   PHART 7.386 02/08/2023 1832   HCO3 20.9 02/08/2023 1832   TCO2 22 02/08/2023 1832   ACIDBASEDEF 4.0 (H) 02/08/2023 1832   O2SAT 96 02/08/2023 1832   CBG (last 3)  Recent Labs    02/13/23 1627 02/13/23 2043 02/14/23 0549  GLUCAP 106* 170* 96    Assessment/Plan: S/P Procedure(s) (LRB): AORTIC VALVE REPLACEMENT (AVR) USING INSPIRIS AORTIC VALVE SIZE (N/A) TRANSESOPHAGEAL ECHOCARDIOGRAM (N/A)  POD 7  Hemodynamically stable in sinus rhythm. BP control ok on Lopressor  and Norvasc .   No further  atrial fib postop. Will decrease to 200 bid.  Continue IS, ambulation  Waiting on insurance approval for SNF. Has bed available at [facility].   LOS: 7 days    Jerry [name] 02/15/2023

## 2023-02-15 NOTE — Progress Notes (Signed)
 Patient sleeping and went into At. Fib. R.V.R. 120-150.V.S.S. Skin warm and dry, resp. even and unlabored., No pain. Gave 22:00 dose of Amiodarone  200 mg and 22:00 dose  of Lopressor  75 mg p.o. early at 20:15.Patient Heart rate went up and down . Then converted to S.R. at 22:41 rate 74. Reden RN aware.

## 2023-02-15 NOTE — Discharge Instructions (Signed)

## 2023-02-15 NOTE — Progress Notes (Signed)
 Physical Therapy Treatment Patient Details Name: Jerry [name] MRN: [id number] DOB: [birth year] Today's Date: 02/15/2023   History of Present Illness 58 yo male admitted 02/07/23 with severe AS. S/p AVR on 1/3. 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   PT Comments  Pt progressing with mobility. Today's session focused on bed mobility and transfer training while maintaining sternal precautions; ambulation for improving strength and activity tolerance. Pt requires cues for attention, safety and sternal precautions. Pt remains limited by generalized weakness, decreased activity tolerance, poor balance strategies/postural reactions and impaired cognition. Per chart, pt without necessary assist upon return home; recommend post-acute rehab (< 3 hrs/day) to maximize functional mobility and independence prior to return home.     If plan is discharge home, recommend the following: A little help with walking and/or transfers;A little help with bathing/dressing/bathroom;Assistance with cooking/housework;Direct supervision/assist for financial management;Assist for transportation;Supervision due to cognitive status   Can travel by private vehicle     Yes  Equipment Recommendations  Rolling walker (2 wheels);BSC/3in1    Recommendations for Other Services       Precautions / Restrictions Precautions Precautions: Sternal;Fall;Other (comment) Precaution Comments: improving awareness of sternal precautions though still requires frequent cues Restrictions Other Position/Activity Restrictions: sternal precautions     Mobility  Bed Mobility Overal bed mobility: Needs Assistance Bed Mobility: Rolling, Sidelying to Sit, Sit to Sidelying   Sidelying to sit: Min assist     Sit to sidelying: Supervision General bed mobility comments: educ on log roll without holding heart pillow with bed mostly flat, cues for sequencing and technique to maintain sternal precautions; increased  time and effort, minA for trunk elevation sidelying>sit, supervision return to supine with repeated cues    Transfers Overall transfer level: Needs assistance Equipment used: Rolling walker (2 wheels) Transfers: Sit to/from Stand Sit to Stand: Contact guard assist           General transfer comment: cues for hand placement without holding heart pillow, pt able to perform repeated sit<>stands from low bed height with hands on and off knees to RW, CGA for safety/lines    Ambulation/Gait Ambulation/Gait assistance: Supervision Gait Distance (Feet): 280 Feet Assistive device: Rolling walker (2 wheels) Gait Pattern/deviations: Step-through pattern, Decreased stride length Gait velocity: Decreased     General Gait Details: slow, steady gait with RW and supervision for safety, pt requiring cues to stay on task as pt frequently stopping to talk and easily distracted   Stairs             Wheelchair Mobility     Tilt Bed    Modified Rankin (Stroke Patients Only)       Balance Overall balance assessment: Needs assistance Sitting-balance support: No upper extremity supported, Feet supported Sitting balance-Leahy Scale: Good     Standing balance support: No upper extremity supported, During functional activity Standing balance-Leahy Scale: Fair Standing balance comment: can static stand without UE support                            Cognition Arousal: Alert Behavior During Therapy: Flat affect Overall Cognitive Status: No family/caregiver present to determine baseline cognitive functioning                     Current Attention Level: Sustained Memory: Decreased short-term memory Following Commands: Follows one step commands consistently, Follows one step commands with increased time Safety/Judgement: Decreased awareness of safety, Decreased awareness of  deficits   Problem Solving: Slow processing, Decreased initiation, Requires verbal  cues General Comments: good awareness of hospital stay and prior hallucinations in ICU. poor attention but ultimately redirectable to task        Exercises Other Exercises Other Exercises: 10x repeated sit<>stands reliant on UE support and momentum to power up, increased time    General Comments General comments (skin integrity, edema, etc.): HR 80s with activity. reviewed educ re: POC, precautions, positioning, activity recommendations, importance of mobility, discharge needs      Pertinent Vitals/Pain Pain Assessment Pain Assessment: Faces Faces Pain Scale: Hurts a little bit Pain Location: sternal incision Pain Descriptors / Indicators: Sore Pain Intervention(s): Monitored during session    Home Living                          Prior Function            PT Goals (current goals can now be found in the care plan section) Progress towards PT goals: Progressing toward goals    Frequency    Min 1X/week      PT Plan      Co-evaluation              AM-PAC PT 6 Clicks Mobility   Outcome Measure  Help needed turning from your back to your side while in a flat bed without using bedrails?: A Little Help needed moving from lying on your back to sitting on the side of a flat bed without using bedrails?: A Little Help needed moving to and from a bed to a chair (including a wheelchair)?: A Little Help needed standing up from a chair using your arms (e.g., wheelchair or bedside chair)?: A Little Help needed to walk in hospital room?: A Little Help needed climbing 3-5 steps with a railing? : A Lot 6 Click Score: 17    End of Session Equipment Utilized During Treatment: Gait belt Activity Tolerance: Patient tolerated treatment well Patient left: in bed;with call bell/phone within reach;with bed alarm set Nurse Communication: Mobility status PT Visit Diagnosis: Other abnormalities of gait and mobility (R26.89);Difficulty in walking, not elsewhere classified  (R26.2)     Time: 8466-8395 PT Time Calculation (min) (ACUTE ONLY): 31 min  Charges:    $Therapeutic Exercise: 8-22 mins $Therapeutic Activity: 8-22 mins PT General Charges $$ ACUTE PT VISIT: 1 Visit                     Darice Almas, PT, DPT Acute Rehabilitation Services  Personal: Secure Chat Rehab Office: [phone]  Darice LITTIE Almas 02/15/2023, 4:50 PM

## 2023-02-15 NOTE — Progress Notes (Signed)
   02/15/23 1140  Spiritual Encounters  Type of Visit Initial  Care provided to: Patient  Referral source Patient request;Nurse (RN/NT/LPN)  Reason for visit Routine spiritual support  OnCall Visit No  Spiritual Framework  Presenting Themes Values and beliefs;Significant life change;Caregiving needs;Community and relationships  Values/beliefs belief in the Christain god and in Jesus  Community/Connection Friend(s)  Patient Stress Factors Major life changes;Health changes;Lack of caregivers  Family Stress Factors None identified  Interventions  Spiritual Care Interventions Made Established relationship of care and support;Compassionate presence;Reflective listening;Normalization of emotions;Narrative/life review;Explored values/beliefs/practices/strengths;Meaning making;Encouragement  Intervention Outcomes  Outcomes Connection to spiritual care;Awareness of support;Awareness around self/spiritual resourses;Reduced anxiety;Reduced isolation  Spiritual Care Plan  Spiritual Care Issues Still Outstanding No further spiritual care needs at this time (see row info)  Recommendations for Clinical Staff Please page the chaplain if patient requests a visit on 1/11-1/12  Follow up plan  Chaplain remains aviailable for visits   Patient states he is lonely and that he has no one. He is worried about his physical and emotional needs being met after discharge. Patient expressed concerns about no being able to take care of himself. Chaplain explored the patient's values, beliefs and needs. Patient believes in god is saved and believes in [place]. Without prompting the patient stated he would never take the coward's way out. He would not kill himself but the chaplain assessed from this statement that this may have crossed his mind.  Chaplains remain available for visits.   Alan Lesches, Chaplain Resident [phone]

## 2023-02-15 NOTE — Progress Notes (Signed)
 Mobility Specialist Progress Note:   02/15/23 1111  Mobility  Activity Ambulated with assistance in hallway  Level of Assistance Contact guard assist, steadying assist  Assistive Device Front wheel walker  Distance Ambulated (ft) 200 ft  RUE Weight Bearing Per Provider Order NWB  LUE Weight Bearing Per Provider Order NWB  Activity Response Tolerated well  Mobility Referral Yes  Mobility visit 1 Mobility  Mobility Specialist Start Time (ACUTE ONLY) 1018  Mobility Specialist Stop Time (ACUTE ONLY) 1035  Mobility Specialist Time Calculation (min) (ACUTE ONLY) 17 min   Pt received in BR, requesting assistance. BM present. Assisted with pericare. Pt agreeable to ambulate in hallway. VSS. Pt needing verbal cues to stay within RW during turns. Pt denied any discomfort, asx throughout. Pt left in chair with call bell in reach and all needs met. Chair alarm on.  Jerry [name]  Mobility Specialist Please contact via [employer] Rehab office at [phone]

## 2023-02-16 LAB — CREATININE, SERUM
Creatinine, Ser: 1.42 mg/dL — ABNORMAL HIGH (ref 0.61–1.24)
GFR, Estimated: 58 mL/min — ABNORMAL LOW (ref >60)

## 2023-02-16 MED ORDER — AMIODARONE IV BOLUS ONLY 150 MG/100ML
150.0000 mg | Freq: Once | INTRAVENOUS | Status: AC
  Administered 2023-02-16: 150 mg via INTRAVENOUS
  Filled 2023-02-16: qty 100

## 2023-02-16 NOTE — Plan of Care (Signed)

## 2023-02-16 NOTE — Progress Notes (Signed)
   02/16/23 1310  Mobility  Activity Ambulated with assistance in hallway  Level of Assistance Contact guard assist, steadying assist  Assistive Device Front wheel walker  Distance Ambulated (ft) 150 ft  RUE Weight Bearing Per Provider Order NWB  LUE Weight Bearing Per Provider Order NWB  Activity Response Tolerated fair  Mobility Referral Yes  Mobility visit 1 Mobility  Mobility Specialist Start Time (ACUTE ONLY) 1233  Mobility Specialist Stop Time (ACUTE ONLY) 1310  Mobility Specialist Time Calculation (min) (ACUTE ONLY) 37 min   Mobility Specialist: Progress Note  Pre-Mobility: HR 78, Post-Mobility: HR 75,  SpO2 95% RA  Pt agreeable to mobility session - received in bed. Required CG using RW. C/o chest soreness and BLE weakness, pt with heavy breathing BOS. Returned to chair with all needs met - call bell within reach.  EOS pt's heart rate fluctuated between bradycardia 40s and tachycardia 150s, VSS.   Virgle Boards, BS Mobility Specialist Please contact via SecureChat or Rehab office at [phone].

## 2023-02-16 NOTE — Progress Notes (Addendum)
 8 Days Post-Op Procedure(s) (LRB): AORTIC VALVE REPLACEMENT (AVR) USING INSPIRIS AORTIC VALVE SIZE (N/A) TRANSESOPHAGEAL ECHOCARDIOGRAM (N/A) Subjective: Feels ok, some soreness  Objective: Vital signs in last 24 hours: Temp:  [97.5 F (36.4 C)-98.5 F (36.9 C)] 98.3 F (36.8 C) (01/11 0741) Pulse Rate:  [70-92] 77 (01/11 0741) Cardiac Rhythm: Normal sinus rhythm (01/11 0741) Resp:  [15-23] 20 (01/11 0741) BP: (131-154)/(65-84) 143/84 (01/11 0741) SpO2:  [94 %-97 %] 97 % (01/11 0741) Weight:  [105 kg] 105 kg (01/11 0351)  Hemodynamic parameters for last 24 hours:    Intake/Output from previous day: 01/10 0701 - 01/11 0700 In: 180 [P.O.:180] Out: 1000 [Urine:1000] Intake/Output this shift: Total I/O In: -  Out: 300 [Urine:300]  General appearance: alert, distracted, and no distress Heart: regular rate and rhythm Lungs: clear to auscultation bilaterally Abdomen: benign Extremities: no edema Wound: incis healing well  Lab Results: Recent Labs    02/14/23 0321  WBC 11.6*  HGB 7.9*  HCT 23.6*  PLT 208   BMET:  Recent Labs    02/14/23 0321  NA 142  K 3.5  CL 105  CO2 29  GLUCOSE 103*  BUN 19  CREATININE 1.21  CALCIUM  8.7*    PT/INR: No results for input(s): LABPROT, INR in the last 72 hours. ABG    Component Value Date/Time   PHART 7.386 02/08/2023 1832   HCO3 20.9 02/08/2023 1832   TCO2 22 02/08/2023 1832   ACIDBASEDEF 4.0 (H) 02/08/2023 1832   O2SAT 96 02/08/2023 1832   CBG (last 3)  Recent Labs    02/14/23 0549 02/15/23 1138 02/15/23 1520  GLUCAP 96 114* 96    Meds Scheduled Meds:  amiodarone   200 mg Oral BID   amLODipine   5 mg Oral Daily   aspirin  EC  325 mg Oral Daily   Or   aspirin   324 mg Per Tube Daily   Chlorhexidine  Gluconate Cloth  6 each Topical Daily   divalproex   1,500 mg Oral BID   enoxaparin  (LOVENOX ) injection  40 mg Subcutaneous QHS   Fe Fum-Vit C-Vit B12-FA  1 capsule Oral BID   metoprolol  tartrate  25 mg  Oral BID   pantoprazole   40 mg Oral Daily   phenytoin   200 mg Oral QHS   rosuvastatin   10 mg Oral QPM   sodium chloride  flush  3 mL Intravenous Q12H   sodium chloride  flush  3 mL Intravenous Q12H   Continuous Infusions: PRN Meds:.metoprolol  tartrate, ondansetron  (ZOFRAN ) IV, oxyCODONE , sodium chloride  flush, sodium chloride  flush, traMADol   Xrays No results found.  Assessment/Plan: S/P Procedure(s) (LRB): AORTIC VALVE REPLACEMENT (AVR) USING INSPIRIS AORTIC VALVE SIZE (N/A) TRANSESOPHAGEAL ECHOCARDIOGRAM (N/A) POD#8  1 had afib overnight- back in sinus- on amio and beta blocker 2 afeb, s BP 130's-150's 3 O2 sats good on RA 4 good UOP- not all measured, weight conts to trend lower, now below preop 5 no new labs or xrays 6 SNF at d/c       LOS: 8 days    Lemond FORBES Cera PA-C Pager [phone] 02/16/2023   Chart reviewed, patient examined, agree with above.  Doing well overall. Waiting on SNF insurance approval.

## 2023-02-16 NOTE — Plan of Care (Signed)
 ?  Problem: Clinical Measurements: ?Goal: Ability to maintain clinical measurements within normal limits will improve ?Outcome: Progressing ?Goal: Will remain free from infection ?Outcome: Progressing ?Goal: Diagnostic test results will improve ?Outcome: Progressing ?  ?

## 2023-02-17 MED ORDER — LACTULOSE 10 GM/15ML PO SOLN
20.0000 g | Freq: Every day | ORAL | Status: DC | PRN
  Administered 2023-02-17: 20 g via ORAL
  Filled 2023-02-17: qty 30

## 2023-02-17 MED ORDER — METOPROLOL TARTRATE 12.5 MG HALF TABLET
12.5000 mg | ORAL_TABLET | Freq: Two times a day (BID) | ORAL | Status: DC
  Administered 2023-02-17 – 2023-02-18 (×2): 12.5 mg via ORAL
  Filled 2023-02-17 (×2): qty 1

## 2023-02-17 NOTE — Progress Notes (Signed)
 Mobility Specialist Progress Note:   02/17/23 1251  Mobility  Activity Dangled on edge of bed  Level of Assistance Contact guard assist, steadying assist  RUE Weight Bearing Per Provider Order NWB  LUE Weight Bearing Per Provider Order NWB  Activity Response Tolerated fair  Mobility Referral Yes  Mobility visit 1 Mobility  Mobility Specialist Start Time (ACUTE ONLY) 1110  Mobility Specialist Stop Time (ACUTE ONLY) 1124  Mobility Specialist Time Calculation (min) (ACUTE ONLY) 14 min   Pre Mobility: 49 HR  During Mobility: 75 HR Post Mobility: 49 HR   Pt received in bed, agreeable to mobility. C/o fatigue and nausea during session. Pt requesting to lay back down after sitting EOB d/t nausea. Pt declining ambulation and left in bed with call bell in reach and all needs met. RN notified.   Brown Husband  Mobility Specialist Please contact via [employer] office at [phone]

## 2023-02-17 NOTE — Plan of Care (Signed)
  Problem: Clinical Measurements: Goal: Ability to maintain clinical measurements within normal limits will improve Outcome: Progressing Goal: Will remain free from infection Outcome: Progressing Goal: Diagnostic test results will improve Outcome: Progressing Goal: Respiratory complications will improve Outcome: Progressing Goal: Cardiovascular complication will be avoided Outcome: Progressing   Problem: Activity: Goal: Risk for activity intolerance will decrease Outcome: Progressing   Problem: Nutrition: Goal: Adequate nutrition will be maintained Outcome: Progressing   Problem: Pain Management: Goal: General experience of comfort will improve Outcome: Progressing

## 2023-02-17 NOTE — Progress Notes (Addendum)
 9 Days Post-Op Procedure(s) (LRB): AORTIC VALVE REPLACEMENT (AVR) USING INSPIRIS AORTIC VALVE SIZE (N/A) TRANSESOPHAGEAL ECHOCARDIOGRAM (N/A) Subjective: Says he doesn't feel well, + constipation , says he is passing flatus but no BM in 2 days, no c/o of abdominal pain  Objective: Vital signs in last 24 hours: Temp:  [97.5 F (36.4 C)-98 F (36.7 C)] 97.5 F (36.4 C) (01/12 1143) Pulse Rate:  [48-153] 49 (01/12 1215) Cardiac Rhythm: Normal sinus rhythm (01/12 0840) Resp:  [16-25] 20 (01/12 1143) BP: (107-144)/(49-95) 144/66 (01/12 1215) SpO2:  [92 %-99 %] 99 % (01/12 1215) Weight:  [107 kg] 107 kg (01/12 0309)  Hemodynamic parameters for last 24 hours:    Intake/Output from previous day: 01/11 0701 - 01/12 0700 In: 446.5 [P.O.:360; I.V.:86.5] Out: 1125 [Urine:1125] Intake/Output this shift: Total I/O In: 240 [P.O.:240] Out: 200 [Urine:200]  General appearance: distracted, fatigued, and no distress Heart: regular rate and rhythm and brady Lungs: clear ant lat, wouldn't sit up for me Abdomen: benign, non distended or tender Extremities: no edema Wound: incis healing well  Lab Results: No results for input(s): WBC, HGB, HCT, PLT in the last 72 hours. BMET:  Recent Labs    02/16/23 0742  CREATININE 1.42*    PT/INR: No results for input(s): LABPROT, INR in the last 72 hours. ABG    Component Value Date/Time   PHART 7.386 02/08/2023 1832   HCO3 20.9 02/08/2023 1832   TCO2 22 02/08/2023 1832   ACIDBASEDEF 4.0 (H) 02/08/2023 1832   O2SAT 96 02/08/2023 1832   CBG (last 3)  Recent Labs    02/15/23 1138 02/15/23 1520  GLUCAP 114* 96    Meds Scheduled Meds:  amiodarone   200 mg Oral BID   amLODipine   5 mg Oral Daily   aspirin  EC  325 mg Oral Daily   Or   aspirin   324 mg Per Tube Daily   Chlorhexidine  Gluconate Cloth  6 each Topical Daily   divalproex   1,500 mg Oral BID   enoxaparin  (LOVENOX ) injection  40 mg Subcutaneous QHS   Fe Fum-Vit  C-Vit B12-FA  1 capsule Oral BID   metoprolol  tartrate  25 mg Oral BID   pantoprazole   40 mg Oral Daily   phenytoin   200 mg Oral QHS   rosuvastatin   10 mg Oral QPM   sodium chloride  flush  3 mL Intravenous Q12H   Continuous Infusions: PRN Meds:.metoprolol  tartrate, ondansetron  (ZOFRAN ) IV, oxyCODONE , sodium chloride  flush, traMADol   Xrays No results found.  Assessment/Plan: S/P Procedure(s) (LRB): AORTIC VALVE REPLACEMENT (AVR) USING INSPIRIS AORTIC VALVE SIZE (N/A) TRANSESOPHAGEAL ECHOCARDIOGRAM (N/A) POD#9  1 had further afib yesterday,received an IV bolus of amio,  some sinus brady today- will decrease metoprolol  dose to 12.5 bid but may need to stop- hold for HR <60 2 O2 sats good on RA 3 weight at about preop 4 fair/adequate UOP- not on diuretics 5 will give lactulose  for constipation 6 recheck labs in am 7 push mobility and rehab as able, SNF approval pending    LOS: 9 days    Lemond FORBES Cera PA-C Pager [phone] 02/17/2023   Chart reviewed, patient examined, agree with above.  ECG today shows sinus brady 49. Lopressor  decreased to 12.5 bid. Amio at 200 bid. Continue observation. If HR remains low will stop Lopressor . Check labs tomorrow.

## 2023-02-18 DIAGNOSIS — I4891 Unspecified atrial fibrillation: Secondary | ICD-10-CM

## 2023-02-18 DIAGNOSIS — Z953 Presence of xenogenic heart valve: Secondary | ICD-10-CM

## 2023-02-18 DIAGNOSIS — I491 Atrial premature depolarization: Secondary | ICD-10-CM | POA: Diagnosis not present

## 2023-02-18 LAB — CBC
HCT: 29.4 % — ABNORMAL LOW (ref 39.0–52.0)
Hemoglobin: 9 g/dL — ABNORMAL LOW (ref 13.0–17.0)
MCH: 34.1 pg — ABNORMAL HIGH (ref 26.0–34.0)
MCHC: 30.6 g/dL (ref 30.0–36.0)
MCV: 111.4 fL — ABNORMAL HIGH (ref 80.0–100.0)
Platelets: 364 10*3/uL (ref 150–400)
RBC: 2.64 MIL/uL — ABNORMAL LOW (ref 4.22–5.81)
RDW: 19.8 % — ABNORMAL HIGH (ref 11.5–15.5)
WBC: 18 10*3/uL — ABNORMAL HIGH (ref 4.0–10.5)
nRBC: 2.1 % — ABNORMAL HIGH (ref 0.0–0.2)

## 2023-02-18 LAB — BASIC METABOLIC PANEL
Anion gap: 12 (ref 5–15)
BUN: 21 mg/dL — ABNORMAL HIGH (ref 6–20)
CO2: 23 mmol/L (ref 22–32)
Calcium: 9.2 mg/dL (ref 8.9–10.3)
Chloride: 104 mmol/L (ref 98–111)
Creatinine, Ser: 1.52 mg/dL — ABNORMAL HIGH (ref 0.61–1.24)
GFR, Estimated: 53 mL/min — ABNORMAL LOW (ref >60)
Glucose, Bld: 114 mg/dL — ABNORMAL HIGH (ref 70–99)
Potassium: 4.6 mmol/L (ref 3.5–5.1)
Sodium: 139 mmol/L (ref 135–145)

## 2023-02-18 LAB — MAGNESIUM: Magnesium: 2.3 mg/dL (ref 1.7–2.4)

## 2023-02-18 LAB — GLUCOSE, CAPILLARY: Glucose-Capillary: 147 mg/dL — ABNORMAL HIGH (ref 70–99)

## 2023-02-18 MED ORDER — BISOPROLOL FUMARATE 10 MG PO TABS
10.0000 mg | ORAL_TABLET | Freq: Every day | ORAL | Status: DC
  Filled 2023-02-18: qty 1

## 2023-02-18 MED ORDER — BISOPROLOL FUMARATE 5 MG PO TABS
5.0000 mg | ORAL_TABLET | Freq: Every day | ORAL | Status: DC
  Administered 2023-02-18: 5 mg via ORAL
  Filled 2023-02-18: qty 1

## 2023-02-18 MED ORDER — SODIUM CHLORIDE 0.9 % IV BOLUS
500.0000 mL | Freq: Once | INTRAVENOUS | Status: AC
  Administered 2023-02-18: 500 mL via INTRAVENOUS

## 2023-02-18 MED ORDER — BISOPROLOL FUMARATE 10 MG PO TABS: 10.0000 mg | ORAL_TABLET | Freq: Every day | ORAL | Status: DC

## 2023-02-18 MED ORDER — AMIODARONE IV BOLUS ONLY 150 MG/100ML
150.0000 mg | Freq: Once | INTRAVENOUS | Status: AC
  Administered 2023-02-18: 150 mg via INTRAVENOUS
  Filled 2023-02-18: qty 100

## 2023-02-18 NOTE — Progress Notes (Signed)
 HR variability as low as 55 and as high as 180.

## 2023-02-18 NOTE — Progress Notes (Signed)
 Chaplain went to visit Pt while visiting the unit. Pt shared with Chaplain about feeling lonely and tired of being there. Pt shared he wanted to go back to work, or to a rehab center and walk toward full recovery. Pt states he has no siblings or relatives, only friends he can relate to, that he misses being surrounded by people and simple things in life. Pt was grateful with Chaplain's visit.  Eric Fortune Chaplain Resident    02/18/23 1650  Spiritual Encounters  Type of Visit Initial  Care provided to: Patient  Referral source Nurse (RN/NT/LPN)  Reason for visit Routine spiritual support  OnCall Visit No

## 2023-02-18 NOTE — Progress Notes (Signed)
 Verbal orders to give metoprolol continue to monitor.   Jillyn Hidden PA.

## 2023-02-18 NOTE — Plan of Care (Signed)
  Problem: Clinical Measurements: Goal: Ability to maintain clinical measurements within normal limits will improve Outcome: Not Progressing Goal: Will remain free from infection Outcome: Progressing

## 2023-02-18 NOTE — Progress Notes (Addendum)
 Patient is complaining of pressure in his chest.  2L [place] to assist with oxygenation.

## 2023-02-18 NOTE — Progress Notes (Addendum)
   02/18/23 1517  Vitals  BP (!) 73/52  MAP (mmHg) (!) 56  Pulse Rate (!) 48  ECG Heart Rate (!) 48  Resp (!) 22  Oxygen Therapy  SpO2 100 %  MEWS Score  MEWS Temp 0  MEWS Systolic 2  MEWS Pulse 1  MEWS RR 1  MEWS LOC 0  MEWS Score 4  MEWS Score Color Red     Primary RN immediately at bedside to assess patient, lethargic, HR in the 40's, see BP above.  Primary team informed. See new orders.

## 2023-02-18 NOTE — Progress Notes (Addendum)
      [address]       [place] [address]             [phone]      10 Days Post-Op Procedure(s) (LRB): AORTIC VALVE REPLACEMENT (AVR) USING INSPIRIS AORTIC VALVE SIZE (N/A) TRANSESOPHAGEAL ECHOCARDIOGRAM (N/A) Subjective: Awake and alert, no new concerns but is feeling anxious about slow progress with transition to SNF.  Had an episode of tachycardia early this morning. EKG showed SVT vs possible atrial flutter at ~150/ min.  Additional bolus of IV amiodarone  given.    BM x 2 yesterday.   Objective: Vital signs in last 24 hours: Temp:  [97.5 F (36.4 C)-98.5 F (36.9 C)] 97.8 F (36.6 C) (01/13 0755) Pulse Rate:  [27-151] 52 (01/13 0755) Cardiac Rhythm: Heart block (01/13 0746) Resp:  [14-29] 21 (01/13 0755) BP: (100-144)/(49-82) 120/66 (01/13 0755) SpO2:  [93 %-100 %] 95 % (01/13 0755) Weight:  [105.3 kg] 105.3 kg (01/13 0245)    Intake/Output from previous day: 01/12 0701 - 01/13 0700 In: 569.8 [P.O.:480; I.V.:89.8] Out: 1000 [Urine:1000] Intake/Output this shift: No intake/output data recorded.  General appearance: alert, cooperative, and no distress Neurologic: intact Heart: irregular rhythm, suspect second degree block but not certain.  Lungs: normal work of breathing, breath sounds are clear.  Abdomen: soft, no tenderness Extremities: no peripheral edema. Wound: the sternotomy incision is intact and dry.  Lab Results: No results for input(s): WBC, HGB, HCT, PLT in the last 72 hours. BMET:  Recent Labs    02/16/23 0742  CREATININE 1.42*    PT/INR: No results for input(s): LABPROT, INR in the last 72 hours. ABG    Component Value Date/Time   PHART 7.386 02/08/2023 1832   HCO3 20.9 02/08/2023 1832   TCO2 22 02/08/2023 1832   ACIDBASEDEF 4.0 (H) 02/08/2023 1832   O2SAT 96 02/08/2023 1832   CBG (last 3)  Recent Labs    02/15/23 1138 02/15/23 1520  GLUCAP 114* 96    Assessment/Plan: S/P Procedure(s)  (LRB): AORTIC VALVE REPLACEMENT (AVR) USING INSPIRIS AORTIC VALVE SIZE (N/A) TRANSESOPHAGEAL ECHOCARDIOGRAM (N/A)  -POD10 bioprosthetic AVR for aortic stenosis with bicuspid aortic valve.     -Perioperative atrial fibrillation with recurrence post-op- Has been on amiodarone  since day of surgery with stable sinus rhythm until post-op day 8.  Had an episode of tachycardia early this morning with EKG showing SVT or a-flutter.  Additional IV amiodarone  given around 3am and now has SR but inconsistent conduction of P-waves.  Will ask EP to evaluate. BMP and Mg level pending.   -RENAL- mild elevation os creat post-op related to diuresis.  AM lab pending.   -History of head injury and Seizure disorder- Depakote  and Dilantin  resumed. Neuro status at baseline.  -GI- tolerating diet, BM yesterday.  -Disposition- living alone prior to surgery. PT /OT recommended short-term SNF prior to returning home and Mr. Coran has agreed to this plan. Awaiting insurance approval but will also need to hold off on transfer until ht cardiac rhythm issues are resolved.    LOS: 10 days    Myron G. Roddenberry, PA-C 02/18/2023   Chart reviewed, patient examined, agree with above.  Had some tachycardial early this am to 140's and received amio bolus. This am he was in sinus around 50 with some blocked P-waves. ECG showed sinus with blocked premature P-waves. EP consulted.

## 2023-02-18 NOTE — Consult Note (Addendum)
 ELECTROPHYSIOLOGY CONSULT NOTE    Patient ID: Jerry [name] MRN: [id number], DOB/AGE: 1965/07/09 58 y.o.  Admit date: 02/08/2023 Date of Consult: 02/18/2023  Primary Physician: Trudy Vaughn FALCON, MD Primary Cardiologist: Vishnu P Mallipeddi, MD  Electrophysiologist: new to Dr. Kennyth     Patient Profile: Jerry [name] is a 58 y.o. male with a history of HLD, non-obs CAD, brain injury in 1984 iso sinus infection, seizure disorder, severe aortic stenosis w bicuspid aortic valve s/p AVR who is being seen today for the evaluation of Afib at the request of Dr. Lucas.  HPI:  Jerry [name] is a 58 y.o. male with PMH as above. He is s/p AVR 1/3 with post-operative AFib and was started on amiodarone  day-of surgery. He had sinus rhythm until POD #8. He had additional tachycardic episode overnight and EP has been asked to evaluate for SVT vs Aflutter.   Currently, he feels ok. He is very sleepy and requests to go home. He denies chest pain, chest pressure, SOB   Labs Potassium4.6 (01/13 0817) Magnesium   2.3 (01/13 0817) Creatinine, ser  1.52* (01/13 0817) PLT  364 (01/13 0817) HGB  9.0* (01/13 0817) WBC 18.0* (01/13 0817)  .    Past Medical History:  Diagnosis Date   Aortic stenosis    Head injury    Subdural hematoma after a traumatic brain injury in 1984   Heart murmur    Hypertension    Mixed hyperlipidemia    Seizure (HCC)    Skin cancer    Varicose veins    Vasculitis (HCC) 11/2014     Surgical History:  Past Surgical History:  Procedure Laterality Date   AORTIC VALVE REPLACEMENT N/A 02/08/2023   Procedure: AORTIC VALVE REPLACEMENT (AVR) USING INSPIRIS AORTIC VALVE SIZE ;  Surgeon: Lucas Dorise POUR, MD;  Location: [facility] OR;  Service: Open Heart Surgery;  Laterality: N/A;   RIGHT/LEFT HEART CATH AND CORONARY ANGIOGRAPHY N/A 12/10/2022   Procedure: RIGHT/LEFT HEART CATH AND CORONARY ANGIOGRAPHY;  Surgeon: Wonda Sharper, MD;  Location: [facility] INVASIVE CV LAB;   Service: Cardiovascular;  Laterality: N/A;   Surgical plate in head  8015   TEE WITHOUT CARDIOVERSION N/A 02/08/2023   Procedure: TRANSESOPHAGEAL ECHOCARDIOGRAM;  Surgeon: Lucas Dorise POUR, MD;  Location: [facility] OR;  Service: Open Heart Surgery;  Laterality: N/A;   TOE INTERNAL FIXATION W/ COMPRESSION SCREW Right      Medications Prior to Admission  Medication Sig Dispense Refill Last Dose/Taking   amLODipine  (NORVASC ) 10 MG tablet Take 1 tablet (10 mg total) by mouth daily. (Patient taking differently: Take 10 mg by mouth in the morning.) 180 tablet 3 02/08/2023 at  4:30 AM   Cholecalciferol (VITAMIN D -3) 25 MCG (1000 UT) CAPS Take 1,000 Units by mouth in the morning.   02/07/2023 Morning   divalproex  (DEPAKOTE  ER) 500 MG 24 hr tablet TAKE 3 TABLETS BY MOUTH IN THE MORNING AND 3 IN THE EVENING 540 tablet 3 02/08/2023 at  5:00 AM   phenytoin  (DILANTIN ) 100 MG ER capsule Take 2 capsules (200 mg total) by mouth at bedtime. 180 capsule 3 02/07/2023 at  9:30 PM   rosuvastatin  (CRESTOR ) 10 MG tablet Take 10 mg by mouth every evening.   02/07/2023 Bedtime    Inpatient Medications:   amiodarone   200 mg Oral BID   amLODipine   5 mg Oral Daily   aspirin  EC  325 mg Oral Daily   Or   aspirin   324 mg Per Tube Daily  Chlorhexidine  Gluconate Cloth  6 each Topical Daily   divalproex   1,500 mg Oral BID   enoxaparin  (LOVENOX ) injection  40 mg Subcutaneous QHS   Fe Fum-Vit C-Vit B12-FA  1 capsule Oral BID   metoprolol  tartrate  12.5 mg Oral BID   pantoprazole   40 mg Oral Daily   phenytoin   200 mg Oral QHS   rosuvastatin   10 mg Oral QPM   sodium chloride  flush  3 mL Intravenous Q12H    Allergies: No Known Allergies  Family History  Problem Relation Age of Onset   Diabetes Father      Physical Exam: Vitals:   02/18/23 0328 02/18/23 0700 02/18/23 0755 02/18/23 1029  BP: 113/74  120/66 133/77  Pulse: 98  (!) 52 94  Resp: (!) 24 (!) 21 (!) 21   Temp: 97.8 F (36.6 C)  97.8 F (36.6 C)   TempSrc: Oral  Oral    SpO2: 98%  95%   Weight:      Height:        GEN- NAD, A&O x 3, flat affect, keeps eyes closed during conversation HEENT: Normocephalic, atraumatic Lungs- CTAB, Normal effort.  Heart- Regular rate and rhythm, No M/G/R.  GI- Soft, NT, ND.  Extremities- No clubbing, cyanosis, or edema   Radiology/Studies: DG Chest 2 View Result Date: 02/14/2023 CLINICAL DATA:  801657, postoperative check following aortic valvular replacement January 3. EXAM: CHEST - 2 VIEW COMPARISON:  Portable chest 02/10/2018 FINDINGS: There are intact and midline sternotomy sutures, and a prosthetic aortic valve. The cardiac size is normal. No vascular congestion is seen. The lungs clear with resolution of left perihilar linear atelectasis noted previously. There is mild chronic elevation of the right hemidiaphragm. The sulci are sharp. The mediastinum is normally outlined. No new osseous findings. Extensive thoracic spine bridging enthesopathy of DISH. IMPRESSION: 1. No evidence of acute chest disease 2. Resolution of left perihilar linear atelectasis noted previously. 3. Status post aortic valve replacement. Electronically Signed   By: Francis Quam M.D.   On: 02/14/2023 07:53   DG CHEST PORT 1 VIEW Result Date: 02/11/2023 CLINICAL DATA:  Status post aortic valve replacement. EXAM: PORTABLE CHEST 1 VIEW COMPARISON:  02/10/2023 and older studies. FINDINGS: All lines and tubes have been removed. Cardiac silhouette is normal in size. Stable changes from the recent aortic valve replacement. No mediastinal widening. Mild linear atelectasis at the left lung base and adjacent to the left hilum is stable from the previous day's exam. Lung volumes remain low. No evidence of pneumonia or pulmonary edema. Possible small effusions.  No pneumothorax. IMPRESSION: 1. No acute findings.  No evidence of an operative complication. 2. Mild persistent left lung base atelectasis. Electronically Signed   By: Alm Parkins M.D.   On: 02/11/2023 08:25    DG Chest 2 View Result Date: 02/10/2023 CLINICAL DATA:  Aortic valve stenosis, severe.  Preop chest exam. EXAM: CHEST - 2 VIEW COMPARISON:  Cardiac CT 10/26/2022 FINDINGS: Low lung volumes.The cardiomediastinal contours are normal. Subsegmental atelectasis at the left lung base. Pulmonary vasculature is normal. No consolidation, pleural effusion, or pneumothorax. No acute osseous abnormalities are seen. Thoracic spondylosis with diffuse anterior spurring. IMPRESSION: No active cardiopulmonary disease. Electronically Signed   By: Andrea Gasman M.D.   On: 02/10/2023 08:35   DG Chest Port 1 View Result Date: 02/10/2023 CLINICAL DATA:  58 year old male status post aortic valve replacement. EXAM: PORTABLE CHEST 1 VIEW COMPARISON:  Chest x-ray 02/09/2023. FINDINGS: Previously noted Swan-Ganz catheter has  been removed. Mediastinal/pericardial drain again noted. Lung volumes are very low. Opacity in the left lung base likely reflective of postoperative subsegmental atelectasis. No definite pleural effusions. No pneumothorax. No evidence of pulmonary edema. Heart size is normal. Upper mediastinal contours are within normal limits. Status post median sternotomy for aortic valve replacement (a stented bioprosthesis is noted). IMPRESSION: 1. Postoperative changes and support apparatus, as above. 2. Low lung volumes with postoperative subsegmental atelectasis in the left lung base. Electronically Signed   By: Toribio Aye M.D.   On: 02/10/2023 08:27   DG Chest Port 1 View Result Date: 02/09/2023 CLINICAL DATA:  Status post aortic valve replacement with bioprosthetic valve. EXAM: PORTABLE CHEST 1 VIEW COMPARISON:  02/08/2023 FINDINGS: Interval removal of ET tube and nasogastric tube. Mediastinal drain is in place. No pneumothorax identified. There is a pulmonary arterial catheter with tip at the level of the main pulmonary artery. Status post median sternotomy. Stable cardiomediastinal contours. Lung volumes are  low. Blunting of the left costophrenic angle may reflect a small left effusion. Persistent subsegmental atelectasis in the left base. No interstitial edema or airspace consolidation. IMPRESSION: 1. Interval removal of ET tube and nasogastric tube. 2. Low lung volumes and left base subsegmental atelectasis. 3. Possible small left effusion. Electronically Signed   By: Waddell Calk M.D.   On: 02/09/2023 09:53   DG Chest Port 1 View Result Date: 02/08/2023 CLINICAL DATA:  Status post aortic valve replacement EXAM: PORTABLE CHEST 1 VIEW COMPARISON:  Chest x-ray 02/07/2023 FINDINGS: Endotracheal tube is at the level of the carina. New prosthetic heart valve present. Swan-Ganz catheter tip of the level of the main pulmonary artery. Enteric tube tip is in the mid stomach. Sternotomy wires are present. There is minimal atelectasis in the left lung base. The lungs are otherwise clear. Lung volumes are low. Cardiomediastinal silhouette is within normal limits. No acute fractures are seen. IMPRESSION: 1. Endotracheal tube is at the level of the carina. Recommend retraction. 2. Minimal atelectasis in the left lung base. Electronically Signed   By: Greig Pique M.D.   On: 02/08/2023 15:20   EP STUDY Result Date: 02/08/2023 See surgical note for result.  VAS US  CAROTID Result Date: 02/07/2023 Carotid Arterial Duplex Study Patient Name:  RYLEN SWINDLER  Date of Exam:   02/07/2023 Medical Rec #: [id number]         Accession #:    [id number] Date of Birth: 1965-02-10         Patient Gender: M Patient Age:   89 years Exam Location:  [place] Procedure:      VAS US  CAROTID Referring Phys: DORISE FELLERS --------------------------------------------------------------------------------  Indications:       Preop for Aortic Valve Replacement. Risk Factors:      Hypertension, hyperlipidemia, coronary artery disease. Limitations        Today's exam was limited due to the high bifurcation of the                    carotid. Comparison  Study:  No priors. Performing Technologist: Ricka Holland RDMS, RVT  Examination Guidelines: A complete evaluation includes B-mode imaging, spectral Doppler, color Doppler, and power Doppler as needed of all accessible portions of each vessel. Bilateral testing is considered an integral part of a complete examination. Limited examinations for reoccurring indications may be performed as noted.  Right Carotid Findings: +----------+--------+--------+--------+------------------+--------+           PSV cm/sEDV cm/sStenosisPlaque DescriptionComments +----------+--------+--------+--------+------------------+--------+ CCA Prox  82      15                                         +----------+--------+--------+--------+------------------+--------+ CCA Distal83      21                                         +----------+--------+--------+--------+------------------+--------+ ICA Prox  66      23      1-39%   calcific                   +----------+--------+--------+--------+------------------+--------+ ICA Distal74      25                                         +----------+--------+--------+--------+------------------+--------+ ECA       62      18                                         +----------+--------+--------+--------+------------------+--------+ +----------+--------+-------+----------------+-------------------+           PSV cm/sEDV cmsDescribe        Arm Pressure (mmHG) +----------+--------+-------+----------------+-------------------+ Subclavian129            Multiphasic, WNL                    +----------+--------+-------+----------------+-------------------+ +---------+--------+--+--------+--+---------+ VertebralPSV cm/s45EDV cm/s13Antegrade +---------+--------+--+--------+--+---------+  Left Carotid Findings: +----------+--------+--------+--------+------------------+--------+           PSV cm/sEDV cm/sStenosisPlaque DescriptionComments  +----------+--------+--------+--------+------------------+--------+ CCA Prox  95      19                                         +----------+--------+--------+--------+------------------+--------+ CCA Distal74      19                                         +----------+--------+--------+--------+------------------+--------+ ICA Prox  54      16      1-39%                              +----------+--------+--------+--------+------------------+--------+ ICA Distal70      20                                         +----------+--------+--------+--------+------------------+--------+ ECA       258     20                                         +----------+--------+--------+--------+------------------+--------+ +----------+--------+--------+--------+-------------------+           PSV cm/sEDV cm/sDescribeArm Pressure (mmHG) +----------+--------+--------+--------+-------------------+ [id number]  Stenotic                    +----------+--------+--------+--------+-------------------+ +---------+--------+--+--------+--+---------+ VertebralPSV cm/s43EDV cm/s18Antegrade +---------+--------+--+--------+--+---------+   Summary: Right Carotid: Velocities in the right ICA are consistent with a 1-39% stenosis. Left Carotid: Velocities in the left ICA are consistent with a 1-39% stenosis. Vertebrals:  Bilateral vertebral arteries demonstrate antegrade flow. Subclavians: Left subclavian artery was stenotic. Normal flow hemodynamics were              seen in the right subclavian artery. *See table(s) above for measurements and observations.  Electronically signed by Lonni Gaskins MD on 02/07/2023 at 11:19:22 AM.    Final     EKG: 02/18/2023 at 0909- SR with PACS, blocked PACs and PVCs; rate 83 02/18/2023 at 0908 - SR w blocked PACs, rate 75 02/18/2023 at 0240 -  2:1 flutter  02/17/2023 at 1205 - SB with bigeminal blocked PACs, rate 49bpm  (personally  reviewed)   TELEMETRY: Aflutter in 150s currently.  (personally reviewed)  Post-bypass TEE - normal LVEF   Assessment/Plan: #) AS s/p AVR #) SVT Patient is POD 10 from bioprosthetic AVR.  He had perioperative AFib and was started on amiodarone  on day-of surgery He had recurrence of SVT overnight, and rec'd additional Amiodarone  IV bolus Would continue PO amiodarone  200mg  BID for one week, then reduce to 200mg  daily Keira Bohlin stop lopressor  and start bisoprolol  10mg  Keep K > 4, Mag > 2       For questions or updates, please contact [facility] Please consult [email or web address] for contact info under Cardiology/STEMI.  Signed, Chantal Needle, NP  02/18/2023 11:10 AM  I have seen and examined this patient with Suzann Riddle.  Agree with above, note added to reflect my findings.  Patient with a past history as above.  He was found to have severe aortic stenosis and this post bioprosthetic AVR.  Postop, he has had atrial fibrillation.  He was started on amiodarone .  He postop day 8, he began tachycardic again and had episodes of both atrial fibrillation and PACs.  PACs appear to be blocked PACs.  Currently, he is quite lethargic and sleepy.  He request to go home.  He denies any symptoms.  GEN: Well nourished, well developed, in no acute distress  HEENT: normal  Neck: no JVD, carotid bruits, or masses Cardiac: RRR; no murmurs, rubs, or gallops,no edema  Respiratory:  clear to auscultation bilaterally, normal work of breathing GI: soft, nontender, nondistended, + BS MS: no deformity or atrophy  Skin: warm and dry Neuro:  Strength and sensation are intact Psych: euthymic mood, full affect   Atrial fibrillation: Currently on amiodarone .  Chrishaun Sasso continue for now.   PACs: Has had multiple episodes of blocked PACs.  No indication of worsening heart block.  For both atrial fibrillation and PVCs, would start bisoprolol  5 mg daily.  This can be titrated up as pressure allows.  Additionally, could  increase amiodarone  to 400 mg twice daily blocked PACs continue to be an issue.  Justis Closser M. Deshauna Cayson MD 02/18/2023 4:33 PM

## 2023-02-18 NOTE — Progress Notes (Signed)
 Pt sustaining 145 afib. Also just ambulated recently with MT. Will f/u as able.  Ethelda Chick BS, ACSM-CEP 02/18/2023 11:27 AM

## 2023-02-18 NOTE — Progress Notes (Addendum)
 Mobility Specialist Progress Note:    02/18/23 1114  Mobility  Activity Ambulated with assistance in hallway  Level of Assistance Minimal assist, patient does 75% or more  Assistive Device Front wheel walker  Distance Ambulated (ft) 225 ft  Activity Response Tolerated well  Mobility Referral Yes  Mobility visit 1 Mobility  Mobility Specialist Start Time (ACUTE ONLY) 1100  Mobility Specialist Stop Time (ACUTE ONLY) 1114  Mobility Specialist Time Calculation (min) (ACUTE ONLY) 14 min   Pt received in chair, agreeable to mobility session. During session, pt relayed impending doom. Pt asked MS Are there some pt's that don't ever leave the hospital... like die? Pt then requested to speak with chaplain about becoming DNR. Tolerated session well, HR stable throughout ambulation, avg HR 99 bpm. After session, pt requested to lie in bed, c/o pain on bottom from sitting. Max HR reached 167 bpm at rest, post mobility. Left pt in bed, HR in 90's. call bell in reach, chair alarm on.   Taralynn Quiett Mobility Specialist Please contact via Special Educational Needs Teacher or  Rehab office at [phone]

## 2023-02-18 NOTE — Progress Notes (Signed)
   02/18/23 1123  Vitals  BP (!) 133/95  MAP (mmHg) 106  Pulse Rate (!) 142  ECG Heart Rate (!) 143  Resp (!) 24  Oxygen Therapy  SpO2 94 %  MEWS Score  MEWS Temp 0  MEWS Systolic 0  MEWS Pulse 3  MEWS RR 1  MEWS LOC 0  MEWS Score 4  MEWS Score Color Red     New onset of Afib, PA paged.

## 2023-02-18 NOTE — TOC Progression Note (Signed)
 Transition of Care [facility]) - Progression Note    Patient Details  Name: Jerry [name] MRN: [id number] Date of Birth: 1965-06-27  [facility] CM/SW Contact  [name], [place] Phone Number: 02/18/2023, 1:06 PM  Clinical Narrative:     TOC continues to follow and will assist with discharge once stable.  CSW has updated [facility]- patient is not stable for d/c at this time.   Montie Louder, MSW, LCSW Clinical Social Worker    Expected Discharge Plan: Skilled Nursing Facility Barriers to Discharge: Continued Medical Work up  Expected Discharge Plan and Services In-house Referral: Clinical Social Work Discharge Planning Services: CM Consult Post Acute Care Choice: Home Health, Skilled Nursing Facility Living arrangements for the past 2 months: Single Family Home                 DME Arranged: 3-N-1, Walker rolling         HH Arranged: RN, PT, OT           Social Determinants of Health (SDOH) Interventions SDOH Screenings   Food Insecurity: No Food Insecurity (02/09/2023)  Housing: Low Risk  (02/09/2023)  Transportation Needs: No Transportation Needs (02/09/2023)  Utilities: Not At Risk (02/09/2023)  Depression (PHQ2-9): Low Risk  (05/11/2020)  Tobacco Use: Medium Risk (02/09/2023)    Readmission Risk Interventions     No data to display

## 2023-02-18 NOTE — Progress Notes (Signed)
   02/18/23 1300  Assess: MEWS Score  BP 114/67  MAP (mmHg) 83  Pulse Rate 74  ECG Heart Rate 75  Resp (!) 30  SpO2 98 %  Assess: MEWS Score  MEWS Temp 0  MEWS Systolic 0  MEWS Pulse 0  MEWS RR 2  MEWS LOC 0  MEWS Score 2  MEWS Score Color Yellow  Assess: if the MEWS score is Yellow or Red  Were vital signs accurate and taken at a resting state? Yes  Does the patient meet 2 or more of the SIRS criteria? No  MEWS guidelines implemented  No, previously yellow, continue vital signs every 4 hours  Notify: Charge Nurse/RN  Name of Charge Nurse/RN Notified BRYAN RN  Provider Notification  Provider Name/Title Dr. Lucas  Date Provider Notified 02/18/23  Time Provider Notified 1130  Method of Notification Page  Notification Reason Other (Comment) (Afib onset)  Provider response Other (Comment) (EP consult in place)  Date of Provider Response 02/18/23  Time of Provider Response 1230  Assess: SIRS CRITERIA  SIRS Temperature  0  SIRS Respirations  1  SIRS Pulse 0  SIRS WBC 1  SIRS Score Sum  2

## 2023-02-18 NOTE — Plan of Care (Signed)
   Problem: Activity: Goal: Risk for activity intolerance will decrease Outcome: Progressing   Problem: Nutrition: Goal: Adequate nutrition will be maintained Outcome: Progressing   Problem: Coping: Goal: Level of anxiety will decrease Outcome: Progressing   Problem: Elimination: Goal: Will not experience complications related to bowel motility Outcome: Progressing Goal: Will not experience complications related to urinary retention Outcome: Progressing

## 2023-02-18 NOTE — Progress Notes (Signed)
   02/18/23 1539  Assess: MEWS Score  BP 117/74  MAP (mmHg) 86  Pulse Rate (!) 46  ECG Heart Rate (!) 47  Resp (!) 27  SpO2 100 %  Assess: MEWS Score  MEWS Temp 0  MEWS Systolic 0  MEWS Pulse 1  MEWS RR 2  MEWS LOC 0  MEWS Score 3  MEWS Score Color Yellow  Assess: SIRS CRITERIA  SIRS Temperature  0  SIRS Respirations  1  SIRS Pulse 0  SIRS WBC 1  SIRS Score Sum  2   Dr. Lucas at bedside to assess patient.

## 2023-02-19 ENCOUNTER — Inpatient Hospital Stay (HOSPITAL_COMMUNITY): Payer: BC Managed Care – PPO

## 2023-02-19 ENCOUNTER — Other Ambulatory Visit (HOSPITAL_COMMUNITY): Payer: Self-pay

## 2023-02-19 ENCOUNTER — Telehealth (HOSPITAL_COMMUNITY): Payer: Self-pay | Admitting: Pharmacy Technician

## 2023-02-19 DIAGNOSIS — I4891 Unspecified atrial fibrillation: Secondary | ICD-10-CM

## 2023-02-19 DIAGNOSIS — I491 Atrial premature depolarization: Secondary | ICD-10-CM | POA: Diagnosis not present

## 2023-02-19 DIAGNOSIS — Z953 Presence of xenogenic heart valve: Secondary | ICD-10-CM | POA: Diagnosis not present

## 2023-02-19 LAB — CBC
HCT: 31.3 % — ABNORMAL LOW (ref 39.0–52.0)
Hemoglobin: 9.6 g/dL — ABNORMAL LOW (ref 13.0–17.0)
MCH: 34.5 pg — ABNORMAL HIGH (ref 26.0–34.0)
MCHC: 30.7 g/dL (ref 30.0–36.0)
MCV: 112.6 fL — ABNORMAL HIGH (ref 80.0–100.0)
Platelets: 431 10*3/uL — ABNORMAL HIGH (ref 150–400)
RBC: 2.78 MIL/uL — ABNORMAL LOW (ref 4.22–5.81)
RDW: 19.9 % — ABNORMAL HIGH (ref 11.5–15.5)
WBC: 20.9 10*3/uL — ABNORMAL HIGH (ref 4.0–10.5)
nRBC: 3.1 % — ABNORMAL HIGH (ref 0.0–0.2)

## 2023-02-19 LAB — TYPE AND SCREEN
ABO/RH(D): A POS
Antibody Screen: POSITIVE

## 2023-02-19 LAB — URINALYSIS, ROUTINE W REFLEX MICROSCOPIC
Bilirubin Urine: NEGATIVE
Glucose, UA: NEGATIVE mg/dL
Hgb urine dipstick: NEGATIVE
Ketones, ur: NEGATIVE mg/dL
Leukocytes,Ua: NEGATIVE
Nitrite: NEGATIVE
Protein, ur: NEGATIVE mg/dL
Specific Gravity, Urine: 1.026 (ref 1.005–1.030)
pH: 5 (ref 5.0–8.0)

## 2023-02-19 LAB — BASIC METABOLIC PANEL
Anion gap: 12 (ref 5–15)
BUN: 26 mg/dL — ABNORMAL HIGH (ref 6–20)
CO2: 22 mmol/L (ref 22–32)
Calcium: 9 mg/dL (ref 8.9–10.3)
Chloride: 103 mmol/L (ref 98–111)
Creatinine, Ser: 1.52 mg/dL — ABNORMAL HIGH (ref 0.61–1.24)
GFR, Estimated: 53 mL/min — ABNORMAL LOW (ref >60)
Glucose, Bld: 106 mg/dL — ABNORMAL HIGH (ref 70–99)
Potassium: 5 mmol/L (ref 3.5–5.1)
Sodium: 137 mmol/L (ref 135–145)

## 2023-02-19 LAB — ECHOCARDIOGRAM COMPLETE
Height: 71 in
S' Lateral: 2.3 cm
Weight: 3834.24 [oz_av]

## 2023-02-19 LAB — PREPARE RBC (CROSSMATCH)

## 2023-02-19 MED ORDER — BISOPROLOL FUMARATE 10 MG PO TABS
10.0000 mg | ORAL_TABLET | Freq: Every day | ORAL | Status: DC
  Administered 2023-02-19: 10 mg via ORAL
  Filled 2023-02-19 (×2): qty 1

## 2023-02-19 MED ORDER — SODIUM CHLORIDE 0.9% IV SOLUTION: Freq: Once | INTRAVENOUS | Status: DC

## 2023-02-19 NOTE — Progress Notes (Signed)
 Occupational Therapy Treatment Patient Details Name: Jerry [name] MRN: [id number] DOB: [birth year] Today's Date: 02/19/2023   History of present illness 58 yo male admitted 02/07/23 with severe AS. S/p AVR on 1/3. 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   OT comments   Pt seemed to be limited by lethargy and decreased level of arousal today. Pt initially motivated to participate in session requesting for his socks to be donned. Pt able to state sternal precautions and agreeable to OOB activity however once pt cued for log roll technique pt began to moan and HR noted to decrease to 49 bpm. Pt unable to state location of discomfort but deferred further activity d/t fluctuating HR. Pt would continue to benefit from skilled occupational therapy while admitted and after d/c to address the below listed limitations in order to improve overall functional mobility and facilitate independence with BADL participation. DC plan remains appropriate, will follow acutely per POC.   vital signs stable initially however HR dropped 49 bpm with minimal activity i.e. rolling in bed. BP 128/69( 86) HR 92 at end of session        If plan is discharge home, recommend the following:  A little help with walking and/or transfers;A lot of help with bathing/dressing/bathroom;Assistance with cooking/housework;Direct supervision/assist for medications management;Direct supervision/assist for financial management;Assist for transportation;Help with stairs or ramp for entrance;Supervision due to cognitive status   Equipment Recommendations  Other (comment) (defer to next level of care)    Recommendations for Other Services      Precautions / Restrictions Precautions Precautions: Sternal;Fall;Other (comment) Precaution Booklet Issued: No Precaution Comments: able to state sternal precautions Restrictions Weight Bearing Restrictions Per Provider Order: No Other Position/Activity Restrictions:  sternal precautions       Mobility Bed Mobility Overal bed mobility: Needs Assistance Bed Mobility: Rolling           General bed mobility comments: pt initiated rolling to R side with pillow at chest however once pt transitioned into sidelying pt began moaning stating  i need to roll on my back. pt unable to verbalize exactly what was wrong    Transfers                   General transfer comment: deferred d/t discomfort and low HR     Balance                                           ADL either performed or assessed with clinical judgement   ADL Overall ADL's : Needs assistance/impaired Eating/Feeding: Set up;Bed level Eating/Feeding Details (indicate cue type and reason): difficulty problem solving how to use straw in milk carton, set- up assist required Grooming: Wash/dry face;Bed level       Lower Body Bathing: Maximal assistance;Bed level Lower Body Bathing Details (indicate cue type and reason): simulated via LB dressing, MAX A to don socks from bed level     Lower Body Dressing: Maximal assistance;Bed level Lower Body Dressing Details (indicate cue type and reason): to don socks from bed level               General ADL Comments: ADL particpation mostly impacted by impaired cog at this time    Extremity/Trunk Assessment Upper Extremity Assessment Upper Extremity Assessment: Generalized weakness (sternal precaution s)   Lower Extremity Assessment Lower Extremity Assessment: Generalized weakness  Cervical / Trunk Assessment Cervical / Trunk Assessment: Normal    Vision Baseline Vision/History: 1 Wears glasses Ability to See in Adequate Light: 0 Adequate Patient Visual Report: No change from baseline     Perception Perception Perception: Not tested   Praxis Praxis Praxis: Not tested    Cognition Arousal: Lethargic, Alert (moments of wakefulness and moments of lethargy) Behavior During Therapy: Flat affect Overall  Cognitive Status: No family/caregiver present to determine baseline cognitive functioning Area of Impairment: Attention, Memory, Following commands, Safety/judgement, Awareness, Problem solving                   Current Attention Level: Focused Memory: Decreased short-term memory Following Commands: Follows one step commands with increased time, Follows multi-step commands inconsistently Safety/Judgement: Decreased awareness of safety, Decreased awareness of deficits Awareness: Intellectual Problem Solving: Slow processing, Decreased initiation, Requires verbal cues General Comments: pt reports I keep dreaming pt offering this therapist a sip of his coke but then states 'i dont have aids pt initially lethargic but awakened when therapist entered statingput on my socks with pt wanting to walk initially        Exercises      Shoulder Instructions       General Comments vital signs stable initially however HR dropped 49 bpm with minimal activity i.e. rolling in bed. BP 128/69( 86) HR 92 at end of session    Pertinent Vitals/ Pain       Pain Assessment Pain Assessment: 0-10 Pain Score:  (pt states pain as 0 but moans in discomfort with bed mobility, pt unable to localize discomfort)  Home Living                                          Prior Functioning/Environment              Frequency  Min 1X/week        Progress Toward Goals  OT Goals(current goals can now be found in the care plan section)  Progress towards OT goals: Progressing toward goals (gradually)  Acute Rehab OT Goals Patient Stated Goal: none stated Time For Goal Achievement: 02/25/23 Potential to Achieve Goals: Fair  Plan      Co-evaluation                 AM-PAC OT 6 Clicks Daily Activity     Outcome Measure   Help from another person eating meals?: None Help from another person taking care of personal grooming?: A Little Help from another person  toileting, which includes using toliet, bedpan, or urinal?: A Lot Help from another person bathing (including washing, rinsing, drying)?: A Lot Help from another person to put on and taking off regular upper body clothing?: A Little Help from another person to put on and taking off regular lower body clothing?: A Lot 6 Click Score: 16    End of Session    OT Visit Diagnosis: Unsteadiness on feet (R26.81);Other abnormalities of gait and mobility (R26.89);Muscle weakness (generalized) (M62.81);Other (comment)   Activity Tolerance Patient limited by lethargy;Patient limited by pain   Patient Left in bed;with call bell/phone within reach;with bed alarm set;with nursing/sitter in room   Nurse Communication Mobility status;Other (comment) (nursing student)        Time: [phone] OT Time Calculation (min): 24 min  Charges: OT General Charges $OT Visit: 1 Visit OT Treatments $Self Care/Home Management :  23-37 mins  Jerry Mallie POUR., COTA/L Acute Rehabilitation Services [phone]   Jerry [name] 02/19/2023, 2:20 PM

## 2023-02-19 NOTE — Progress Notes (Addendum)
  Patient Name: Jerry [name] Date of Encounter: 02/19/2023  Primary Cardiologist: Vishnu P Mallipeddi, MD Electrophysiologist: None  Interval Summary   No new complaints this am. Was not aware of PAF overnight.   Vital Signs    Vitals:   02/19/23 0413 02/19/23 0535 02/19/23 0600 02/19/23 0825  BP: 119/74  (!) 123/92 109/68  Pulse: 81 (!) 153 75 63  Resp: 20 (!) 27 16 20   Temp: 97.7 F (36.5 C)  97.6 F (36.4 C) 97.6 F (36.4 C)  TempSrc: Oral  Oral Oral  SpO2: 98% 92% 94% 98%  Weight:  108.7 kg    Height:        Intake/Output Summary (Last 24 hours) at 02/19/2023 0833 Last data filed at 02/19/2023 9392 Gross per 24 hour  Intake 921.68 ml  Output 900 ml  Net 21.68 ml   Filed Weights   02/17/23 0309 02/18/23 0245 02/19/23 0535  Weight: 107 kg 105.3 kg 108.7 kg    Physical Exam    GEN- The patient is well appearing, alert and oriented x 3 today.   Lungs- Clear to ausculation bilaterally, normal work of breathing Cardiac- Regular rate and rhythm, no murmurs, rubs or gallops GI- soft, NT, ND, + BS Extremities- no clubbing or cyanosis. No edema  Telemetry    NSR 60-80s with PVCs, PACs, and blocked PACs. Periods of bigeminal PVCs. Periods of bigeminal BLOCKED PACs (Not heart block), Episode of AF went up to ~ 150 bpm (personally reviewed)  Hospital Course    Jerry [name] is a 58 y.o. male with a history of HLD, non-obs CAD, brain injury in 1984 iso sinus infection, seizure disorder, severe aortic stenosis w bicuspid aortic valve s/p AVR who is being seen today for the evaluation of Afib at the request of Dr. Lucas.   Assessment & Plan    Paroxysmal AF Continue amiodarone  200 mg BID to continue loading Suspect that burden Harutyun Monteverde continue to improve as he recovers from surgery.  CHA2DS2VASC2 is 2 for CAD (mild non-obstructive and HTN.) Abdullahi Vallone review OAC indication with MD given only brief bursts thus far.   PACs PVCs Increase bisoprolol  to 10 mg daily, can  continue to titrate as tolerated.  Periods of blocked PACs with HRs in 50s (and at times lower 40s) have been observed; Carleah Yablonski continue to monitor.   For questions or updates, please contact [facility] Please consult [email or web address] for contact info under Cardiology/STEMI.  Signed, Ozell Prentice Passey, PA-C  02/19/2023, 8:33 AM   I have seen and examined this patient with [name].  Agree with above, note added to reflect my findings.  Patient without acute complaint.  Continues to have PVCs and PACs as well as atrial fibrillation.  GEN: Well nourished, well developed, in no acute distress  HEENT: normal  Neck: no JVD, carotid bruits, or masses Cardiac: RRR; no murmurs, rubs, or gallops,no edema  Respiratory:  clear to auscultation bilaterally, normal work of breathing GI: soft, nontender, nondistended, + BS MS: no deformity or atrophy  Skin: warm and dry Neuro:  Strength and sensation are intact Psych: euthymic mood, full affect   Paroxysmal atrial fibrillation: Continue amiodarone  200 mg twice daily.  For now, with short episodes, we Latria Mccarron hold off on anticoagulation.  Brodi Nery increase bisoprolol  to 10 mg today. PACs/PVCs: Continue amiodarone .  Erandy Mceachern M. Shawntrice Salle MD 02/19/2023 12:35 PM

## 2023-02-19 NOTE — Progress Notes (Signed)
   02/19/23 1211  Assess: MEWS Score  Temp 97.9 F (36.6 C)  BP 103/75  MAP (mmHg) 84  Pulse Rate (!) 134  ECG Heart Rate (!) 135  Resp 18  Level of Consciousness Alert  SpO2 93 %  Assess: MEWS Score  MEWS Temp 0  MEWS Systolic 0  MEWS Pulse 3  MEWS RR 0  MEWS LOC 0  MEWS Score 3  MEWS Score Color Yellow  Assess: if the MEWS score is Yellow or Red  Were vital signs accurate and taken at a resting state? Yes  Does the patient meet 2 or more of the SIRS criteria? No  MEWS guidelines implemented  Yes, yellow  Treat  MEWS Interventions Considered administering scheduled or prn medications/treatments as ordered  Take Vital Signs  Increase Vital Sign Frequency  Yellow: Q2hr x1, continue Q4hrs until patient remains green for 12hrs  Escalate  MEWS: Escalate Yellow: Discuss with charge nurse and consider notifying provider and/or RRT  Notify: Charge Nurse/RN  Name of Charge Nurse/RN Notified Lexicographer  Provider Notification  Provider Name/Title Ozell Passey PA  Date Provider Notified 02/19/23  Time Provider Notified 1214  Method of Notification Call  Notification Reason Other (Comment) (Elevated HR)  Provider response No new orders  Date of Provider Response 02/19/23  Time of Provider Response 1214  Assess: SIRS CRITERIA  SIRS Temperature  0  SIRS Respirations  0  SIRS Pulse 1  SIRS WBC 1  SIRS Score Sum  2

## 2023-02-19 NOTE — Telephone Encounter (Signed)
 Patient Product/process Development Scientist completed.    The patient is insured through  [facility] . Patient has [employer], may use a copay card, and/or apply for patient assistance if available.    Ran test claim for Savaysa 30 mg and Product Not Covered   This test claim was processed through [employer]- copay amounts may vary at other pharmacies due to [employer], or as the patient moves through the different stages of their insurance plan.     Reyes Sharps, CPHT Pharmacy Technician III Certified Patient Advocate [facility] Patient Advocate Team Direct Number: [phone]  Fax: [phone]

## 2023-02-19 NOTE — Progress Notes (Signed)
 Patient's HR up and down from 40s-150's. Will continue to monitor

## 2023-02-19 NOTE — Anesthesia Preprocedure Evaluation (Addendum)
 Anesthesia Evaluation  Patient identified by MRN, date of birth, ID band Patient awake    Reviewed: Allergy & Precautions, NPO status , Patient's Chart, lab work & pertinent test results, reviewed documented beta blocker date and time   History of Anesthesia Complications Negative for: history of anesthetic complications  Airway Mallampati: IV  TM Distance: >3 FB Neck ROM: Full    Dental  (+) Dental Advisory Given   Pulmonary former smoker   Pulmonary exam normal breath sounds clear to auscultation       Cardiovascular hypertension, Pt. on medications and Pt. on home beta blockers (-) angina (-) Past MI and (-) CHF + Valvular Problems/Murmurs  Rhythm:Regular Rate:Normal  Echo 02/19/2023  1. Left ventricular ejection fraction, by estimation, is 50 to 55%. The left ventricle has low normal function. The left ventricle has no regional wall motion abnormalities. There is mild concentric left ventricular hypertrophy. Left ventricular diastolic function could not be evaluated.   2. Right ventricular systolic function is normal. The right ventricular size is small.   3. Large pericardial effusion. The pericardial effusion is circumferential. The right ventricle dose not fully expand. Per report, patient refused echo after clip 17.   4. The mitral valve was not well visualized. No evidence of mitral valve regurgitation.   5. The aortic valve was not well visualized. Aortic valve regurgitation is not visualized.   Comparison(s): Effusion is new from TEE.       Neuro/Psych Seizures -, Well Controlled,  Previous crani for ? Bacterial meningitis and seizures in 1984  negative psych ROS   GI/Hepatic negative GI ROS, Neg liver ROS,,,  Endo/Other  negative endocrine ROS    Renal/GU negative Renal ROSLab Results      Component                Value               Date                      NA                       141                  02/08/2023                K                        3.8                 02/08/2023                CO2                      27                  02/07/2023                GLUCOSE                  133 (H)             02/08/2023                BUN                      13  02/08/2023                CREATININE               1.40 (H)            02/08/2023                CALCIUM                   9.3                 02/07/2023                EGFR                     77                  12/07/2022                GFRNONAA                 >60                 02/07/2023                Musculoskeletal negative musculoskeletal ROS (+)    Abdominal  (+) + obese  Peds  Hematology negative hematology ROS (+)   Anesthesia Other Findings   Reproductive/Obstetrics                             Anesthesia Physical Anesthesia Plan  ASA: 4  Anesthesia Plan: General   Post-op Pain Management: Ofirmev  IV (intra-op)*   Induction: Intravenous  PONV Risk Score and Plan: 3 and Ondansetron , Dexamethasone , Treatment may vary due to age or medical condition and Midazolam   Airway Management Planned: Oral ETT and Video Laryngoscope Planned  Additional Equipment: Arterial line, CVP and Ultrasound Guidance Line Placement  Intra-op Plan:   Post-operative Plan: Possible Post-op intubation/ventilation  Informed Consent: I have reviewed the patients History and Physical, chart, labs and discussed the procedure including the risks, benefits and alternatives for the proposed anesthesia with the patient or authorized representative who has indicated his/her understanding and acceptance.     Dental advisory given  Plan Discussed with: CRNA  Anesthesia Plan Comments:         Anesthesia Quick Evaluation

## 2023-02-19 NOTE — TOC Progression Note (Addendum)
 Transition of Care [facility]) - Progression Note    Patient Details  Name: Jerry [name] MRN: [id number] Date of Birth: 05-Mar-1965  [facility] CM/SW Contact  [name], [place] Phone Number: 02/19/2023, 1:00 PM  Clinical Narrative:     CSW received message from [facility], not in network with patient's insurance plan ( he has Psychiatric Nurse, not the [employer] plan)- he will need to select another SNF.   TOC will assist with getting more bed offers since he currently not medically stable for d/c.  Montie Louder, MSW, LCSW Clinical Social Worker    Expected Discharge Plan: Skilled Nursing Facility Barriers to Discharge: Continued Medical Work up  Expected Discharge Plan and Services In-house Referral: Clinical Social Work Discharge Planning Services: CM Consult Post Acute Care Choice: Home Health, Skilled Nursing Facility Living arrangements for the past 2 months: Single Family Home                 DME Arranged: 3-N-1, Walker rolling         HH Arranged: RN, PT, OT           Social Determinants of Health (SDOH) Interventions SDOH Screenings   Food Insecurity: No Food Insecurity (02/09/2023)  Housing: Low Risk  (02/09/2023)  Transportation Needs: No Transportation Needs (02/09/2023)  Utilities: Not At Risk (02/09/2023)  Depression (PHQ2-9): Low Risk  (05/11/2020)  Tobacco Use: Medium Risk (02/09/2023)    Readmission Risk Interventions     No data to display

## 2023-02-19 NOTE — H&P (View-Only) (Signed)
 [address]       [place] [address]             [phone]      11 Days Post-Op Procedure(s) (LRB): AORTIC VALVE REPLACEMENT (AVR) USING INSPIRIS AORTIC VALVE SIZE (N/A) TRANSESOPHAGEAL ECHOCARDIOGRAM (N/A) Subjective: Awake and alert, discouraged with having to stay in the hospital.   Objective: Vital signs in last 24 hours: Temp:  [97.6 F (36.4 C)-98.6 F (37 C)] 97.6 F (36.4 C) (01/14 0600) Pulse Rate:  [26-153] 75 (01/14 0600) Cardiac Rhythm: Normal sinus rhythm (01/14 0749) Resp:  [14-32] 16 (01/14 0600) BP: (73-146)/(36-125) 123/92 (01/14 0600) SpO2:  [92 %-100 %] 94 % (01/14 0600) Weight:  [108.7 kg] 108.7 kg (01/14 0535)    Intake/Output from previous day: 01/13 0701 - 01/14 0700 In: 921.7 [P.O.:420; IV Piggyback:501.7] Out: 1050 [Urine:1050] Intake/Output this shift: No intake/output data recorded.  General appearance: alert, cooperative, and no distress Neurologic: intact Heart: irregular rhythm, in and out of atrial flutter.  Lungs: normal work of breathing, breath sounds are clear.  Abdomen: soft, no tenderness Extremities: no peripheral edema. Wound: the sternotomy incision is intact and dry.  Lab Results: Recent Labs    02/18/23 0817 02/19/23 0322  WBC 18.0* 20.9*  HGB 9.0* 9.6*  HCT 29.4* 31.3*  PLT 364 431*   BMET:  Recent Labs    02/18/23 0817 02/19/23 0322  NA 139 137  K 4.6 5.0  CL 104 103  CO2 23 22  GLUCOSE 114* 106*  BUN 21* 26*  CREATININE 1.52* 1.52*  CALCIUM  9.2 9.0    PT/INR: No results for input(s): LABPROT, INR in the last 72 hours. ABG    Component Value Date/Time   PHART 7.386 02/08/2023 1832   HCO3 20.9 02/08/2023 1832   TCO2 22 02/08/2023 1832   ACIDBASEDEF 4.0 (H) 02/08/2023 1832   O2SAT 96 02/08/2023 1832   CBG (last 3)  Recent Labs    02/18/23 1525  GLUCAP 147*    Assessment/Plan: S/P Procedure(s) (LRB): AORTIC VALVE REPLACEMENT (AVR) USING INSPIRIS AORTIC  VALVE SIZE (N/A) TRANSESOPHAGEAL ECHOCARDIOGRAM (N/A)  -POD11 bioprosthetic AVR for aortic stenosis with bicuspid aortic valve.     -Atrial flutter alternating with SR with freq PACs.  EP on board and assistance appreciated. On amiodarone  200mg  po BID and bisoprolol  5mg  daily.  May need to consider anticoagulation at this point.  -RENAL- Creat stable ~1.5.   -History of head injury and seizure disorder- Depakote  and Dilantin  resumed. Neuro status at baseline.  -Leukocytosis- WBC 20K, no fever, no cough or dysuria. Will check UA and CXR today.   -GI- tolerating diet, and having bowel function  -Disposition- living alone prior to surgery. PT /OT recommended short-term SNF prior to returning home and [name] has agreed to this plan. Awaiting insurance approval but will also need to hold off on transfer until his cardiac rhythm issues are resolved.    LOS: 11 days    Myron G. Roddenberry, PA-C 02/19/2023   ADDENDUM:  Per discussion with pharmacy, if anticoagulation required, will need to use coumadin since he is on Dilantin .  M. Roddenberry, PA-C    Chart reviewed, patient examined, agree with above.  Echo today shows a large pericardial effusion although it was cut short by patient. LV function low normal. He has been hemodynamically stable but the effusion could be exacerbating arrhythmias and may be responsible for leukocytosis. I think it would be best to  drain this in the OR tomorrow am by subxyphoid approach. I discussed procedure, alternatives and benefits and risks with him and he agrees to proceed.

## 2023-02-19 NOTE — Progress Notes (Signed)
 Patient keeps calling out for his mom. Patient answers questions appropriately. When asked where his mom is at, pt states that, " I think she's dead".   Asked the patient when, he stated that, "it was hot out, maybe June?".

## 2023-02-19 NOTE — Progress Notes (Addendum)
 [address]       [place] [address]             [phone]      11 Days Post-Op Procedure(s) (LRB): AORTIC VALVE REPLACEMENT (AVR) USING INSPIRIS AORTIC VALVE SIZE (N/A) TRANSESOPHAGEAL ECHOCARDIOGRAM (N/A) Subjective: Awake and alert, discouraged with having to stay in the hospital.   Objective: Vital signs in last 24 hours: Temp:  [97.6 F (36.4 C)-98.6 F (37 C)] 97.6 F (36.4 C) (01/14 0600) Pulse Rate:  [26-153] 75 (01/14 0600) Cardiac Rhythm: Normal sinus rhythm (01/14 0749) Resp:  [14-32] 16 (01/14 0600) BP: (73-146)/(36-125) 123/92 (01/14 0600) SpO2:  [92 %-100 %] 94 % (01/14 0600) Weight:  [108.7 kg] 108.7 kg (01/14 0535)    Intake/Output from previous day: 01/13 0701 - 01/14 0700 In: 921.7 [P.O.:420; IV Piggyback:501.7] Out: 1050 [Urine:1050] Intake/Output this shift: No intake/output data recorded.  General appearance: alert, cooperative, and no distress Neurologic: intact Heart: irregular rhythm, in and out of atrial flutter.  Lungs: normal work of breathing, breath sounds are clear.  Abdomen: soft, no tenderness Extremities: no peripheral edema. Wound: the sternotomy incision is intact and dry.  Lab Results: Recent Labs    02/18/23 0817 02/19/23 0322  WBC 18.0* 20.9*  HGB 9.0* 9.6*  HCT 29.4* 31.3*  PLT 364 431*   BMET:  Recent Labs    02/18/23 0817 02/19/23 0322  NA 139 137  K 4.6 5.0  CL 104 103  CO2 23 22  GLUCOSE 114* 106*  BUN 21* 26*  CREATININE 1.52* 1.52*  CALCIUM  9.2 9.0    PT/INR: No results for input(s): LABPROT, INR in the last 72 hours. ABG    Component Value Date/Time   PHART 7.386 02/08/2023 1832   HCO3 20.9 02/08/2023 1832   TCO2 22 02/08/2023 1832   ACIDBASEDEF 4.0 (H) 02/08/2023 1832   O2SAT 96 02/08/2023 1832   CBG (last 3)  Recent Labs    02/18/23 1525  GLUCAP 147*    Assessment/Plan: S/P Procedure(s) (LRB): AORTIC VALVE REPLACEMENT (AVR) USING INSPIRIS AORTIC  VALVE SIZE (N/A) TRANSESOPHAGEAL ECHOCARDIOGRAM (N/A)  -POD11 bioprosthetic AVR for aortic stenosis with bicuspid aortic valve.     -Atrial flutter alternating with SR with freq PACs.  EP on board and assistance appreciated. On amiodarone  200mg  po BID and bisoprolol  5mg  daily.  May need to consider anticoagulation at this point.  -RENAL- Creat stable ~1.5.   -History of head injury and seizure disorder- Depakote  and Dilantin  resumed. Neuro status at baseline.  -Leukocytosis- WBC 20K, no fever, no cough or dysuria. Will check UA and CXR today.   -GI- tolerating diet, and having bowel function  -Disposition- living alone prior to surgery. PT /OT recommended short-term SNF prior to returning home and [name] has agreed to this plan. Awaiting insurance approval but will also need to hold off on transfer until his cardiac rhythm issues are resolved.    LOS: 11 days    Jerry G. Roddenberry, PA-C 02/19/2023   ADDENDUM:  Per discussion with pharmacy, if anticoagulation required, will need to use coumadin since he is on Dilantin .  M. Roddenberry, PA-C    Chart reviewed, patient examined, agree with above.  Echo today shows a large pericardial effusion although it was cut short by patient. LV function low normal. He has been hemodynamically stable but the effusion could be exacerbating arrhythmias and may be responsible for leukocytosis. I think it would be best to  drain this in the OR tomorrow am by subxyphoid approach. I discussed procedure, alternatives and benefits and risks with him and he agrees to proceed.

## 2023-02-19 NOTE — Progress Notes (Addendum)
 Patient sleeping hr up 143-156 S.T. Back to S.R. at 06:50. NO complaints voiced. Jasmine December RN aware. Cont. To monitor patient and rhythm.

## 2023-02-19 NOTE — Progress Notes (Signed)
 Attempted echocardiogram this morning. Patient allowed 5 minutes of study to be completed before refusing to continue. Submitted the 2 images that I was able to obtain.

## 2023-02-20 ENCOUNTER — Inpatient Hospital Stay (HOSPITAL_COMMUNITY): Payer: BC Managed Care – PPO

## 2023-02-20 ENCOUNTER — Inpatient Hospital Stay (HOSPITAL_COMMUNITY): Payer: Self-pay | Admitting: Certified Registered Nurse Anesthetist

## 2023-02-20 ENCOUNTER — Encounter (HOSPITAL_COMMUNITY)
Admission: RE | Disposition: A | Discharge status: Other Acute Inpatient Hospital | Payer: Self-pay | Source: Home / Self Care | Attending: Surgery

## 2023-02-20 ENCOUNTER — Encounter (HOSPITAL_COMMUNITY): Payer: Self-pay | Admitting: Surgery

## 2023-02-20 ENCOUNTER — Other Ambulatory Visit: Payer: Self-pay

## 2023-02-20 DIAGNOSIS — I3139 Other pericardial effusion (noninflammatory): Secondary | ICD-10-CM | POA: Diagnosis not present

## 2023-02-20 DIAGNOSIS — Z952 Presence of prosthetic heart valve: Secondary | ICD-10-CM

## 2023-02-20 HISTORY — PX: TEE WITHOUT CARDIOVERSION: SHX5443

## 2023-02-20 HISTORY — PX: SUBXYPHOID PERICARDIAL WINDOW: SHX5075

## 2023-02-20 LAB — POCT I-STAT 7, (LYTES, BLD GAS, ICA,H+H)
Acid-base deficit: 13 mmol/L — ABNORMAL HIGH (ref 0.0–2.0)
Bicarbonate: 13.1 mmol/L — ABNORMAL LOW (ref 20.0–28.0)
Calcium, Ion: 1.01 mmol/L — ABNORMAL LOW (ref 1.15–1.40)
HCT: 24 % — ABNORMAL LOW (ref 39.0–52.0)
Hemoglobin: 8.2 g/dL — ABNORMAL LOW (ref 13.0–17.0)
O2 Saturation: 99 %
Patient temperature: 36.8
Potassium: 5 mmol/L (ref 3.5–5.1)
Sodium: 139 mmol/L (ref 135–145)
TCO2: 14 mmol/L — ABNORMAL LOW (ref 22–32)
pCO2 arterial: 29 mm[Hg] — ABNORMAL LOW (ref 32–48)
pH, Arterial: 7.261 — ABNORMAL LOW (ref 7.35–7.45)
pO2, Arterial: 133 mm[Hg] — ABNORMAL HIGH (ref 83–108)

## 2023-02-20 LAB — CBC
HCT: 26.5 % — ABNORMAL LOW (ref 39.0–52.0)
Hemoglobin: 8.1 g/dL — ABNORMAL LOW (ref 13.0–17.0)
MCH: 34.3 pg — ABNORMAL HIGH (ref 26.0–34.0)
MCHC: 30.6 g/dL (ref 30.0–36.0)
MCV: 112.3 fL — ABNORMAL HIGH (ref 80.0–100.0)
Platelets: 389 10*3/uL (ref 150–400)
RBC: 2.36 MIL/uL — ABNORMAL LOW (ref 4.22–5.81)
RDW: 19.9 % — ABNORMAL HIGH (ref 11.5–15.5)
WBC: 18.7 10*3/uL — ABNORMAL HIGH (ref 4.0–10.5)
nRBC: 1.4 % — ABNORMAL HIGH (ref 0.0–0.2)

## 2023-02-20 LAB — GLUCOSE, CAPILLARY
Glucose-Capillary: 107 mg/dL — ABNORMAL HIGH (ref 70–99)
Glucose-Capillary: 119 mg/dL — ABNORMAL HIGH (ref 70–99)
Glucose-Capillary: 159 mg/dL — ABNORMAL HIGH (ref 70–99)

## 2023-02-20 LAB — PROTIME-INR
INR: 1.5 — ABNORMAL HIGH (ref 0.8–1.2)
Prothrombin Time: 18.6 s — ABNORMAL HIGH (ref 11.4–15.2)

## 2023-02-20 LAB — ECHO INTRAOPERATIVE TEE
Height: 71 in
Weight: 3876.57 [oz_av]

## 2023-02-20 SURGERY — CREATION, PERICARDIAL WINDOW, SUBXIPHOID APPROACH
Anesthesia: General

## 2023-02-20 MED ORDER — EPHEDRINE 5 MG/ML INJ
INTRAVENOUS | Status: AC
  Filled 2023-02-20: qty 5

## 2023-02-20 MED ORDER — METOCLOPRAMIDE HCL 5 MG/ML IJ SOLN
10.0000 mg | Freq: Four times a day (QID) | INTRAMUSCULAR | Status: AC
  Administered 2023-02-20 – 2023-02-21 (×4): 10 mg via INTRAVENOUS
  Filled 2023-02-20 (×4): qty 2

## 2023-02-20 MED ORDER — ROCURONIUM BROMIDE 10 MG/ML (PF) SYRINGE
PREFILLED_SYRINGE | INTRAVENOUS | Status: DC | PRN
  Administered 2023-02-20: 30 mg via INTRAVENOUS

## 2023-02-20 MED ORDER — ONDANSETRON HCL 4 MG/2ML IJ SOLN: 4.0000 mg | Freq: Four times a day (QID) | INTRAMUSCULAR | Status: DC | PRN

## 2023-02-20 MED ORDER — FE FUM-VIT C-VIT B12-FA 460-60-0.01-1 MG PO CAPS
1.0000 | ORAL_CAPSULE | Freq: Two times a day (BID) | ORAL | Status: DC
  Administered 2023-02-20 – 2023-03-05 (×27): 1 via ORAL
  Filled 2023-02-20 (×27): qty 1

## 2023-02-20 MED ORDER — DROPERIDOL 2.5 MG/ML IJ SOLN
0.6250 mg | Freq: Once | INTRAMUSCULAR | Status: DC | PRN
Start: 2023-02-20 — End: 2023-02-20

## 2023-02-20 MED ORDER — MIDAZOLAM HCL 2 MG/2ML IJ SOLN
INTRAMUSCULAR | Status: DC | PRN
  Administered 2023-02-20: .5 mg via INTRAVENOUS

## 2023-02-20 MED ORDER — FENTANYL CITRATE (PF) 250 MCG/5ML IJ SOLN
INTRAMUSCULAR | Status: AC
  Filled 2023-02-20: qty 5

## 2023-02-20 MED ORDER — CEFAZOLIN SODIUM-DEXTROSE 2-3 GM-%(50ML) IV SOLR
INTRAVENOUS | Status: DC | PRN
  Administered 2023-02-20: 2 g via INTRAVENOUS

## 2023-02-20 MED ORDER — AMIODARONE HCL 200 MG PO TABS
200.0000 mg | ORAL_TABLET | Freq: Two times a day (BID) | ORAL | Status: DC
  Administered 2023-02-20 (×2): 200 mg via ORAL
  Filled 2023-02-20: qty 1

## 2023-02-20 MED ORDER — ONDANSETRON HCL 4 MG/2ML IJ SOLN
INTRAMUSCULAR | Status: DC | PRN
  Administered 2023-02-20: 4 mg via INTRAVENOUS

## 2023-02-20 MED ORDER — ASPIRIN 81 MG PO CHEW
324.0000 mg | CHEWABLE_TABLET | Freq: Every day | ORAL | Status: DC
  Filled 2023-02-20 (×3): qty 4

## 2023-02-20 MED ORDER — SUCCINYLCHOLINE CHLORIDE 200 MG/10ML IV SOSY
PREFILLED_SYRINGE | INTRAVENOUS | Status: DC | PRN
  Administered 2023-02-20: 140 mg via INTRAVENOUS

## 2023-02-20 MED ORDER — DEXAMETHASONE SODIUM PHOSPHATE 10 MG/ML IJ SOLN
INTRAMUSCULAR | Status: AC
  Filled 2023-02-20: qty 1

## 2023-02-20 MED ORDER — DIVALPROEX SODIUM ER 500 MG PO TB24
1500.0000 mg | ORAL_TABLET | Freq: Two times a day (BID) | ORAL | Status: DC
  Administered 2023-02-20 – 2023-03-05 (×27): 1500 mg via ORAL
  Filled 2023-02-20 (×29): qty 3

## 2023-02-20 MED ORDER — SUGAMMADEX SODIUM 200 MG/2ML IV SOLN
INTRAVENOUS | Status: DC | PRN
  Administered 2023-02-20: 200 mg via INTRAVENOUS

## 2023-02-20 MED ORDER — ROCURONIUM BROMIDE 10 MG/ML (PF) SYRINGE
PREFILLED_SYRINGE | INTRAVENOUS | Status: AC
  Filled 2023-02-20: qty 10

## 2023-02-20 MED ORDER — INSULIN ASPART 100 UNIT/ML IJ SOLN
0.0000 [IU] | INTRAMUSCULAR | Status: DC
  Administered 2023-02-20 – 2023-02-21 (×2): 2 [IU] via SUBCUTANEOUS

## 2023-02-20 MED ORDER — ORAL CARE MOUTH RINSE: 15.0000 mL | Freq: Once | OROMUCOSAL | Status: AC

## 2023-02-20 MED ORDER — DEXTROSE-SODIUM CHLORIDE 5-0.45 % IV SOLN: INTRAVENOUS | Status: DC

## 2023-02-20 MED ORDER — MORPHINE SULFATE (PF) 2 MG/ML IV SOLN
2.0000 mg | INTRAVENOUS | Status: DC | PRN
  Administered 2023-02-21: 2 mg via INTRAVENOUS
  Filled 2023-02-20: qty 1

## 2023-02-20 MED ORDER — CEFAZOLIN SODIUM-DEXTROSE 2-4 GM/100ML-% IV SOLN
2.0000 g | Freq: Three times a day (TID) | INTRAVENOUS | Status: AC
  Administered 2023-02-20 (×2): 2 g via INTRAVENOUS
  Filled 2023-02-20 (×2): qty 100

## 2023-02-20 MED ORDER — AMIODARONE IV BOLUS ONLY 150 MG/100ML
150.0000 mg | Freq: Once | INTRAVENOUS | Status: AC
  Administered 2023-02-20: 150 mg via INTRAVENOUS
  Filled 2023-02-20: qty 100

## 2023-02-20 MED ORDER — HYDROMORPHONE HCL 1 MG/ML IJ SOLN
INTRAMUSCULAR | Status: AC
  Filled 2023-02-20: qty 1

## 2023-02-20 MED ORDER — PHENYLEPHRINE 80 MCG/ML (10ML) SYRINGE FOR IV PUSH (FOR BLOOD PRESSURE SUPPORT)
PREFILLED_SYRINGE | INTRAVENOUS | Status: AC
  Filled 2023-02-20: qty 10

## 2023-02-20 MED ORDER — PROPOFOL 10 MG/ML IV BOLUS
INTRAVENOUS | Status: DC | PRN
  Administered 2023-02-20: 100 mg via INTRAVENOUS

## 2023-02-20 MED ORDER — HYDROMORPHONE HCL 1 MG/ML IJ SOLN
0.2500 mg | INTRAMUSCULAR | Status: DC | PRN
  Administered 2023-02-20: 0.25 mg via INTRAVENOUS

## 2023-02-20 MED ORDER — CHLORHEXIDINE GLUCONATE 0.12 % MT SOLN
15.0000 mL | Freq: Once | OROMUCOSAL | Status: AC
  Administered 2023-02-20: 15 mL via OROMUCOSAL

## 2023-02-20 MED ORDER — VASOPRESSIN 20 UNIT/ML IV SOLN
INTRAVENOUS | Status: DC | PRN
  Administered 2023-02-20: 1 [IU] via INTRAVENOUS

## 2023-02-20 MED ORDER — SENNOSIDES-DOCUSATE SODIUM 8.6-50 MG PO TABS
1.0000 | ORAL_TABLET | Freq: Every day | ORAL | Status: DC
  Administered 2023-02-20 – 2023-03-03 (×9): 1 via ORAL
  Filled 2023-02-20 (×13): qty 1

## 2023-02-20 MED ORDER — FENTANYL CITRATE (PF) 250 MCG/5ML IJ SOLN
INTRAMUSCULAR | Status: DC | PRN
  Administered 2023-02-20: 100 ug via INTRAVENOUS

## 2023-02-20 MED ORDER — PHENYLEPHRINE HCL (PRESSORS) 10 MG/ML IV SOLN
INTRAVENOUS | Status: DC | PRN
  Administered 2023-02-20 (×4): 160 ug via INTRAVENOUS

## 2023-02-20 MED ORDER — PANTOPRAZOLE SODIUM 40 MG PO TBEC
40.0000 mg | DELAYED_RELEASE_TABLET | Freq: Every day | ORAL | Status: DC
  Administered 2023-02-21 – 2023-03-04 (×12): 40 mg via ORAL
  Filled 2023-02-20 (×12): qty 1

## 2023-02-20 MED ORDER — ACETAMINOPHEN 500 MG PO TABS
1000.0000 mg | ORAL_TABLET | Freq: Four times a day (QID) | ORAL | Status: AC
  Administered 2023-02-20 – 2023-02-24 (×17): 1000 mg via ORAL
  Filled 2023-02-20 (×17): qty 2

## 2023-02-20 MED ORDER — SUCCINYLCHOLINE CHLORIDE 200 MG/10ML IV SOSY
PREFILLED_SYRINGE | INTRAVENOUS | Status: AC
  Filled 2023-02-20: qty 10

## 2023-02-20 MED ORDER — ASPIRIN 325 MG PO TBEC
325.0000 mg | DELAYED_RELEASE_TABLET | Freq: Every day | ORAL | Status: DC
  Administered 2023-02-20 – 2023-03-05 (×14): 325 mg via ORAL
  Filled 2023-02-20 (×13): qty 1

## 2023-02-20 MED ORDER — CHLORHEXIDINE GLUCONATE CLOTH 2 % EX PADS
6.0000 | MEDICATED_PAD | Freq: Every day | CUTANEOUS | Status: DC
  Administered 2023-02-21 – 2023-03-05 (×11): 6 via TOPICAL

## 2023-02-20 MED ORDER — ACETAMINOPHEN 160 MG/5ML PO SOLN: 1000.0000 mg | Freq: Four times a day (QID) | ORAL | Status: AC

## 2023-02-20 MED ORDER — BISACODYL 5 MG PO TBEC
10.0000 mg | DELAYED_RELEASE_TABLET | Freq: Every day | ORAL | Status: DC
  Administered 2023-02-20 – 2023-03-05 (×9): 10 mg via ORAL
  Filled 2023-02-20 (×12): qty 2

## 2023-02-20 MED ORDER — ONDANSETRON HCL 4 MG/2ML IJ SOLN
INTRAMUSCULAR | Status: AC
  Filled 2023-02-20: qty 2

## 2023-02-20 MED ORDER — VASOPRESSIN 20 UNIT/ML IV SOLN
INTRAVENOUS | Status: AC
  Filled 2023-02-20: qty 1

## 2023-02-20 MED ORDER — MIDAZOLAM HCL 2 MG/2ML IJ SOLN
INTRAMUSCULAR | Status: AC
Start: 2023-02-20 — End: ?
  Filled 2023-02-20: qty 2

## 2023-02-20 MED ORDER — PHENYLEPHRINE HCL-NACL 20-0.9 MG/250ML-% IV SOLN
INTRAVENOUS | Status: DC | PRN
  Administered 2023-02-20: 60 ug/min via INTRAVENOUS
  Administered 2023-02-20: 50 ug/min via INTRAVENOUS

## 2023-02-20 MED ORDER — PANTOPRAZOLE SODIUM 40 MG PO TBEC
40.0000 mg | DELAYED_RELEASE_TABLET | Freq: Every day | ORAL | Status: DC
  Administered 2023-02-20 – 2023-02-21 (×2): 40 mg via ORAL
  Filled 2023-02-20: qty 1

## 2023-02-20 MED ORDER — EPHEDRINE SULFATE (PRESSORS) 50 MG/ML IJ SOLN
INTRAMUSCULAR | Status: DC | PRN
  Administered 2023-02-20: 5 mg via INTRAVENOUS

## 2023-02-20 MED ORDER — 0.9 % SODIUM CHLORIDE (POUR BTL) OPTIME
TOPICAL | Status: DC | PRN
  Administered 2023-02-20: 1000 mL
  Administered 2023-02-20: 2000 mL

## 2023-02-20 MED ORDER — DOPAMINE-DEXTROSE 3.2-5 MG/ML-% IV SOLN
3.0000 ug/kg/min | INTRAVENOUS | Status: DC
  Administered 2023-02-21: 3 ug/kg/min via INTRAVENOUS
  Filled 2023-02-20: qty 250

## 2023-02-20 MED ORDER — DEXAMETHASONE SODIUM PHOSPHATE 10 MG/ML IJ SOLN
INTRAMUSCULAR | Status: DC | PRN
  Administered 2023-02-20: 10 mg via INTRAVENOUS

## 2023-02-20 MED ORDER — LACTATED RINGERS IV SOLN: INTRAVENOUS | Status: DC

## 2023-02-20 MED ORDER — PROPOFOL 10 MG/ML IV BOLUS
INTRAVENOUS | Status: AC
  Filled 2023-02-20: qty 20

## 2023-02-20 SURGICAL SUPPLY — 46 items
BLADE CLIPPER SURG (BLADE) ×1 IMPLANT
CANISTER SUCT 3000ML PPV (MISCELLANEOUS) ×1 IMPLANT
CATH THORACIC 28FR (CATHETERS) IMPLANT
CATH THORACIC 28FR RT ANG (CATHETERS) IMPLANT
CATH THORACIC 36FR (CATHETERS) IMPLANT
CATH THORACIC 36FR RT ANG (CATHETERS) IMPLANT
CATH TROCAR 28FR (CATHETERS) IMPLANT
CLIP TI WIDE RED SMALL 24 (CLIP) IMPLANT
CNTNR URN SCR LID CUP LEK RST (MISCELLANEOUS) ×1 IMPLANT
CONN ST 1/4X3/8 BEN (MISCELLANEOUS) IMPLANT
DRAIN CHANNEL 28F RND 3/8 FF (WOUND CARE) IMPLANT
DRAPE LAPAROSCOPIC ABDOMINAL (DRAPES) ×1 IMPLANT
DRAPE WARM FLUID 44X44 (DRAPES) ×1 IMPLANT
DRSG COVADERM 4X6 (GAUZE/BANDAGES/DRESSINGS) IMPLANT
ELECT REM PT RETURN 9FT ADLT (ELECTROSURGICAL) ×1
ELECTRODE REM PT RTRN 9FT ADLT (ELECTROSURGICAL) ×1 IMPLANT
GAUZE 4X4 16PLY ~~LOC~~+RFID DBL (SPONGE) ×1 IMPLANT
GAUZE SPONGE 4X4 12PLY STRL (GAUZE/BANDAGES/DRESSINGS) ×1 IMPLANT
GLOVE SURG MICRO LTX SZ7 (GLOVE) ×1 IMPLANT
GOWN STRL REUS W/ TWL LRG LVL3 (GOWN DISPOSABLE) ×1 IMPLANT
GOWN STRL REUS W/ TWL XL LVL3 (GOWN DISPOSABLE) ×1 IMPLANT
KIT BASIN OR (CUSTOM PROCEDURE TRAY) ×1 IMPLANT
KIT SUCTION CATH 14FR (SUCTIONS) IMPLANT
KIT TURNOVER KIT B (KITS) ×1 IMPLANT
NS IRRIG 1000ML POUR BTL (IV SOLUTION) ×1 IMPLANT
PACK CHEST (CUSTOM PROCEDURE TRAY) ×1 IMPLANT
PAD ARMBOARD 7.5X6 YLW CONV (MISCELLANEOUS) ×2 IMPLANT
PAD ELECT DEFIB RADIOL ZOLL (MISCELLANEOUS) ×1 IMPLANT
SPONGE T-LAP 18X18 ~~LOC~~+RFID (SPONGE) ×4 IMPLANT
SPONGE T-LAP 4X18 ~~LOC~~+RFID (SPONGE) ×1 IMPLANT
SUT BONE WAX W31G (SUTURE) IMPLANT
SUT VIC AB 0 CT1 18XCR BRD 8 (SUTURE) ×1 IMPLANT
SUT VIC AB 1 CTX 18 (SUTURE) IMPLANT
SUT VIC AB 2-0 CT1 TAPERPNT 27 (SUTURE) IMPLANT
SUT VIC AB 3-0 X1 27 (SUTURE) IMPLANT
SWAB COLLECTION DEVICE MRSA (MISCELLANEOUS) IMPLANT
SWAB CULTURE ESWAB REG 1ML (MISCELLANEOUS) IMPLANT
SYR 10ML LL (SYRINGE) IMPLANT
SYR 50ML SLIP (SYRINGE) IMPLANT
SYSTEM SAHARA CHEST DRAIN ATS (WOUND CARE) IMPLANT
TAPE CLOTH SURG 4X10 WHT LF (GAUZE/BANDAGES/DRESSINGS) IMPLANT
TOWEL GREEN STERILE (TOWEL DISPOSABLE) ×1 IMPLANT
TOWEL GREEN STERILE FF (TOWEL DISPOSABLE) ×1 IMPLANT
TRAP SPECIMEN MUCUS 40CC (MISCELLANEOUS) ×1 IMPLANT
TRAY FOLEY SLVR 16FR TEMP STAT (SET/KITS/TRAYS/PACK) ×1 IMPLANT
WATER STERILE IRR 1000ML POUR (IV SOLUTION) ×2 IMPLANT

## 2023-02-20 NOTE — Plan of Care (Signed)
  Problem: Activity: Goal: Risk for activity intolerance will decrease Outcome: Not Progressing   Problem: Nutrition: Goal: Adequate nutrition will be maintained Outcome: Not Progressing   Problem: Coping: Goal: Level of anxiety will decrease Outcome: Not Progressing   Problem: Elimination: Goal: Will not experience complications related to bowel motility Outcome: Not Progressing   Problem: Pain Management: Goal: General experience of comfort will improve Outcome: Not Progressing

## 2023-02-20 NOTE — TOC Progression Note (Addendum)
 Transition of Care [facility]) - Progression Note    Patient Details  Name: Jerry [name] MRN: [id number] Date of Birth: Apr 23, 1965  [facility]) CM/SW Contact  Carmon Christen, LCSWA Phone Number: 02/20/2023, 3:06 PM  Clinical Narrative:     CSW followed up on pending SNF bed offers for patient. Debbie with [facility] is reviewing referral and Richad Champagne with [facility] is reviewing referral. CSW also waiting on confirmation with [facility] and [facility] if they can accept patients insurance for SNF. CSW will continue to follow.  Update- [facility] confirmed they are in network with patients insurance. CSW awaiting confirmation from [facility], and [facility].  Expected Discharge Plan: Skilled Nursing Facility Barriers to Discharge: Continued Medical Work up  Expected Discharge Plan and Services In-house Referral: Clinical Social Work Discharge Planning Services: CM Consult Post Acute Care Choice: Home Health, Skilled Nursing Facility Living arrangements for the past 2 months: Single Family Home                 DME Arranged: 3-N-1, Walker rolling         HH Arranged: RN, PT, OT           Social Determinants of Health (SDOH) Interventions SDOH Screenings   Food Insecurity: No Food Insecurity (02/09/2023)  Housing: Low Risk  (02/09/2023)  Transportation Needs: No Transportation Needs (02/09/2023)  Utilities: Not At Risk (02/09/2023)  Depression (PHQ2-9): Low Risk  (05/11/2020)  Tobacco Use: Medium Risk (02/20/2023)    Readmission Risk Interventions     No data to display

## 2023-02-20 NOTE — Anesthesia Procedure Notes (Signed)
 Procedure Name: Intubation Date/Time: 02/20/2023 8:50 AM  Performed by: Grier Leber, CRNAPre-anesthesia Checklist: Patient identified, Emergency Drugs available, Suction available and Patient being monitored Patient Re-evaluated:Patient Re-evaluated prior to induction Oxygen Delivery Method: Circle System Utilized Preoxygenation: Pre-oxygenation with 100% oxygen Induction Type: IV induction Ventilation: Mask ventilation without difficulty Laryngoscope Size: Glidescope and 4 Grade View: Grade I Tube type: Oral Tube size: 8.0 mm Number of attempts: 1 Airway Equipment and Method: Stylet and Video-laryngoscopy Placement Confirmation: ETT inserted through vocal cords under direct vision, positive ETCO2 and breath sounds checked- equal and bilateral Secured at: 23 cm Tube secured with: Tape Dental Injury: Teeth and Oropharynx as per pre-operative assessment

## 2023-02-20 NOTE — Transfer of Care (Signed)
 Immediate Anesthesia Transfer of Care Note  Patient: Jerry [name]  Procedure(s) Performed: SUBXYPHOID PERICARDIAL WINDOW TRANSESOPHAGEAL ECHOCARDIOGRAM (TEE)  Patient Location: PACU  Anesthesia Type:General  Level of Consciousness: awake and patient cooperative  Airway & Oxygen Therapy: Patient Spontanous Breathing and Patient connected to nasal cannula oxygen  Post-op Assessment: Report given to RN and Post -op Vital signs reviewed and stable  Post vital signs: Reviewed and stable  Last Vitals:  Vitals Value Taken Time  BP 104/94 02/20/23 1015  Temp    Pulse 123 02/20/23 1019  Resp 25 02/20/23 1019  SpO2 95 % 02/20/23 1019  Vitals shown include unfiled device data.  Last Pain:  Vitals:   02/20/23 0655  TempSrc: Oral  PainSc:       Patients Stated Pain Goal: 0 (02/16/23 1225)  Complications: No notable events documented.

## 2023-02-20 NOTE — Progress Notes (Signed)
 Patient requested to see chaplain. Chaplain at bedside

## 2023-02-20 NOTE — Interval H&P Note (Signed)
 History and Physical Interval Note:  02/20/2023 6:45 AM  Jerry [name]  has presented today for surgery, with the diagnosis of PERICARDIAL EFFUSION.  The various methods of treatment have been discussed with the patient and family. After consideration of risks, benefits and other options for treatment, the patient has consented to  Procedure(s): SUBXYPHOID PERICARDIAL WINDOW (N/A) TRANSESOPHAGEAL ECHOCARDIOGRAM (TEE) (N/A) as a surgical intervention.  The patient's history has been reviewed, patient examined, no change in status, stable for surgery.  I have reviewed the patient's chart and labs.  Questions were answered to the patient's satisfaction.     Judie Hollick K Jancarlo Biermann

## 2023-02-20 NOTE — Anesthesia Postprocedure Evaluation (Signed)
 Anesthesia Post Note  Patient: Jerry [name]  Procedure(s) Performed: SUBXYPHOID PERICARDIAL WINDOW TRANSESOPHAGEAL ECHOCARDIOGRAM (TEE)     Patient location during evaluation: PACU Anesthesia Type: General Level of consciousness: sedated and patient cooperative Pain management: pain level controlled Vital Signs Assessment: post-procedure vital signs reviewed and stable Respiratory status: spontaneous breathing Cardiovascular status: stable Anesthetic complications: no   No notable events documented.  Last Vitals:  Vitals:   02/20/23 1115 02/20/23 1130  BP: 111/75 120/77  Pulse: (!) 101 97  Resp: (!) 24 (!) 23  Temp:    SpO2: 98% 93%    Last Pain:  Vitals:   02/20/23 1030  TempSrc:   PainSc: 5                  Gorman Laughter

## 2023-02-20 NOTE — Progress Notes (Signed)
 Chaplain responded to request from pt's nurse to provide comfort to pt who has been agitated this evening.  Chaplain offered ministry of presence and compassion.  Pt and Chaplain lived on the same street so had some connection.  Pt reported his mom died in 2022/08/14 and that he had taken care of her.  Pt offered he now lives alone and has no one.  Chaplain affirmed how it is difficult to go something like this without the support of family.  How he misses his mom.  Pt. remarked all he needed right now was some compassion.  Chaplain stayed about twenty minutes holding his hand and ministering to him through touch and prayer.  Alexis Anger Chaplain

## 2023-02-20 NOTE — Op Note (Signed)
 CARDIOVASCULAR SURGERY OPERATIVE NOTE  02/20/2023  Surgeon:  Bartley Lightning, MD  First Assistant: RNFA   Preoperative Diagnosis:  Large pericardial effusion s/p aortic valve replacement.   Postoperative Diagnosis:  Same  Procedure:  Subxyphoid pericardial window and insertion of left pleural chest tube.   Anesthesia:  General Endotracheal   Clinical History/Surgical Indication:  The patient is a 58 year old gentleman who underwent aortic valve replacement using a 25 mm INSPIRIS pericardial valve on 02/08/2023.  He has had some postoperative atrial fibrillation but otherwise has been doing reasonably well and waiting to go to a SNF bed.  Over the last couple days he has had increasing episodes of atrial fibrillation with rapid ventricular response as well as episodes of bradycardia when in sinus rhythm.  His blood pressure has been lower sometimes in the 80s to 90s and he has been feeling well overall.  We obtained an echocardiogram which showed a large circumferential pericardial effusion with no clear signs of tamponade although the RV did not appear to be feeling completely.  I felt the best option would be to perform a subxiphoid pericardial window to drain this effusion.  I discussed the operative procedure with him including alternatives, benefits, and risks and he understood and agreed to proceed.   Preparation:  The patient was seen in the preoperative holding area and the correct patient, correct operation were confirmed with the patient after reviewing the medical record and catheterization. The consent was signed by me. Preoperative antibiotics were given.  The patient was taken back to the operating room and positioned supine on the operating room table. After being placed under general endotracheal anesthesia by the anesthesia team a foley catheter was placed. The neck, chest, and abdomen were prepped with betadine soap and solution and draped in the usual sterile manner. A  surgical time-out was taken and the correct patient and operative procedure were confirmed with the nursing and anesthesia staff.   TEE:  Performed by Dr. Joann Mu.  This showed a large pericardial effusion particularly posteriorly.  Left ventricular function appeared normal.  The right ventricle appeared somewhat compressed.  The prosthetic aortic valve was functioning normally.  Subxyphoid pericardial window:  A 4 cm incision was made over the xyphoid process and carried down through the subcutaneous tissue using cautery until the midline fascia was encountered.  The previously placed suture was removed and the fascia was divided in the midline and the subxyphoid space entered. The inferior pericardium was visualized just at the diaphragm and it was opened with suction tip. The pericardial space was entered and about 600 cc of old bloody fluid was removed. TEE confirmed that all of the fluid was removed.  The patient's blood pressure immediately rose to 150-170 systolic and heart rate decreased.  A 28 F right angle chest tube was placed in the inferior pericardial space through a separate small incision. Hemostasis was complete. The midline fascia was approximated with interrupted #0 vicryl sutures. The subcutaneous tissue was approximated with continuous 2-0 vicryl suture and the skin with 3-0 vicryl subcuticular suture.  The TEE had also shown a significant left pleural effusion.  Therefore a 28 French trocar chest tube was inserted through a stab incision in the left anterior axillary line sixth intercostal space and directed posteriorly.  This drained about 400 cc of serous fluid.  It was sutured to the chest with a silk suture.  Both tubes were connected to Pleur-evac suction.  The sponge, needle, and instrument counts were correct according  to the nurses. The patient was awakened, extubated, and transported to the PACU in stable condition.

## 2023-02-20 NOTE — Anesthesia Procedure Notes (Signed)
 Arterial Line Insertion Start/End1/15/2025 8:10 AM, 02/20/2023 8:17 AM Performed by: Gorman Laughter, MD, Grier Leber, CRNA, CRNA  Patient location: Pre-op. Preanesthetic checklist: patient identified, IV checked, site marked, risks and benefits discussed, surgical consent, monitors and equipment checked, pre-op evaluation, timeout performed and anesthesia consent Lidocaine  1% used for infiltration Left, radial was placed Catheter size: 20 G Hand hygiene performed  and maximum sterile barriers used   Attempts: 2 Procedure performed without using ultrasound guided technique. Following insertion, dressing applied and Biopatch. Post procedure assessment: normal  Patient tolerated the procedure well with no immediate complications.

## 2023-02-20 NOTE — Brief Op Note (Signed)
 02/20/2023  10:25 AM  PATIENT:  Jerry [name]  58 y.o. male  PRE-OPERATIVE DIAGNOSIS:  PERICARDIAL EFFUSION  POST-OPERATIVE DIAGNOSIS:  PERICARDIAL EFFUSION  PROCEDURE:  Procedure(s): SUBXYPHOID PERICARDIAL WINDOW (N/A) TRANSESOPHAGEAL ECHOCARDIOGRAM (TEE) (N/A)  SURGEON:  Surgeons and Role:    * Bartley Lightning, MD - Primary  PHYSICIAN ASSISTANT: none  ASSISTANTS: RNFA   ANESTHESIA:   general  EBL:  minimal   600 cc old dark blood fluid removed from pericardium   BLOOD ADMINISTERED:none  DRAINS:  28 F Chest Tube(s) in the posterior pericardium and 28 F left pleural chest tube    LOCAL MEDICATIONS USED:  NONE  SPECIMEN:  No Specimen  DISPOSITION OF SPECIMEN:  N/A  COUNTS:  YES  TOURNIQUET:  * No tourniquets in log *  DICTATION: .Note written in EPIC  PLAN OF CARE: Admit to inpatient   PATIENT DISPOSITION:  PACU - hemodynamically stable.   Delay start of Pharmacological VTE agent (>24hrs) due to surgical blood loss or risk of bleeding: yes

## 2023-02-20 NOTE — Anesthesia Procedure Notes (Addendum)
Central Venous Catheter Insertion Performed by: Lewie Loron, MD, anesthesiologist Start/End1/15/2025 8:12 AM, 02/20/2023 8:27 AM Patient location: Pre-op. Preanesthetic checklist: patient identified, IV checked, site marked, risks and benefits discussed, surgical consent, monitors and equipment checked, pre-op evaluation, timeout performed and anesthesia consent Position: Trendelenburg Lidocaine 1% used for infiltration and patient sedated Hand hygiene performed  and maximum sterile barriers used  Catheter size: 8 Fr Total catheter length 16. Central line was placed.Double lumen Procedure performed using ultrasound guided technique. Ultrasound Notes:anatomy identified, needle tip was noted to be adjacent to the nerve/plexus identified, no ultrasound evidence of intravascular and/or intraneural injection and image(s) printed for medical record Attempts: 1 Following insertion, dressing applied, line sutured and Biopatch. Post procedure assessment: blood return through all ports, free fluid flow and no air  Patient tolerated the procedure well with no immediate complications.

## 2023-02-21 ENCOUNTER — Encounter (HOSPITAL_COMMUNITY): Payer: Self-pay | Admitting: Surgery

## 2023-02-21 ENCOUNTER — Inpatient Hospital Stay (HOSPITAL_COMMUNITY): Payer: BC Managed Care – PPO

## 2023-02-21 DIAGNOSIS — I4891 Unspecified atrial fibrillation: Secondary | ICD-10-CM | POA: Diagnosis not present

## 2023-02-21 LAB — GLUCOSE, CAPILLARY
Glucose-Capillary: 128 mg/dL — ABNORMAL HIGH (ref 70–99)
Glucose-Capillary: 99 mg/dL (ref 70–99)
Glucose-Capillary: 93 mg/dL (ref 70–99)
Glucose-Capillary: 107 mg/dL — ABNORMAL HIGH (ref 70–99)
Glucose-Capillary: 97 mg/dL (ref 70–99)

## 2023-02-21 LAB — BASIC METABOLIC PANEL
Anion gap: 8 (ref 5–15)
BUN: 46 mg/dL — ABNORMAL HIGH (ref 6–20)
CO2: 25 mmol/L (ref 22–32)
Calcium: 7.7 mg/dL — ABNORMAL LOW (ref 8.9–10.3)
Chloride: 101 mmol/L (ref 98–111)
Creatinine, Ser: 1.72 mg/dL — ABNORMAL HIGH (ref 0.61–1.24)
GFR, Estimated: 46 mL/min — ABNORMAL LOW (ref >60)
Glucose, Bld: 115 mg/dL — ABNORMAL HIGH (ref 70–99)
Potassium: 3.8 mmol/L (ref 3.5–5.1)
Sodium: 134 mmol/L — ABNORMAL LOW (ref 135–145)

## 2023-02-21 LAB — CBC
HCT: 28.8 % — ABNORMAL LOW (ref 39.0–52.0)
Hemoglobin: 9 g/dL — ABNORMAL LOW (ref 13.0–17.0)
MCH: 34.6 pg — ABNORMAL HIGH (ref 26.0–34.0)
MCHC: 31.3 g/dL (ref 30.0–36.0)
MCV: 110.8 fL — ABNORMAL HIGH (ref 80.0–100.0)
Platelets: 414 10*3/uL — ABNORMAL HIGH (ref 150–400)
RBC: 2.6 MIL/uL — ABNORMAL LOW (ref 4.22–5.81)
RDW: 19.7 % — ABNORMAL HIGH (ref 11.5–15.5)
WBC: 22.8 10*3/uL — ABNORMAL HIGH (ref 4.0–10.5)
nRBC: 1.1 % — ABNORMAL HIGH (ref 0.0–0.2)

## 2023-02-21 MED ORDER — AMIODARONE IV BOLUS ONLY 150 MG/100ML
150.0000 mg | Freq: Once | INTRAVENOUS | Status: AC
  Administered 2023-02-21: 150 mg via INTRAVENOUS
  Filled 2023-02-21: qty 100

## 2023-02-21 MED ORDER — AMIODARONE HCL 200 MG PO TABS
400.0000 mg | ORAL_TABLET | Freq: Two times a day (BID) | ORAL | Status: DC
  Administered 2023-02-21 – 2023-02-27 (×14): 400 mg via ORAL
  Filled 2023-02-21 (×14): qty 2

## 2023-02-21 MED ORDER — POTASSIUM CHLORIDE CRYS ER 20 MEQ PO TBCR
20.0000 meq | EXTENDED_RELEASE_TABLET | Freq: Once | ORAL | Status: AC
  Administered 2023-02-21: 20 meq via ORAL
  Filled 2023-02-21: qty 1

## 2023-02-21 MED ORDER — TRAMADOL HCL 50 MG PO TABS
50.0000 mg | ORAL_TABLET | Freq: Four times a day (QID) | ORAL | Status: DC | PRN
  Administered 2023-02-22 – 2023-03-03 (×6): 50 mg via ORAL
  Filled 2023-02-21 (×7): qty 1

## 2023-02-21 MED ORDER — INSULIN ASPART 100 UNIT/ML IJ SOLN
0.0000 [IU] | Freq: Three times a day (TID) | INTRAMUSCULAR | Status: DC
Start: 2023-02-21 — End: 2023-02-22

## 2023-02-21 NOTE — Plan of Care (Signed)
  Problem: Clinical Measurements: Goal: Ability to maintain clinical measurements within normal limits will improve Outcome: Progressing Goal: Will remain free from infection Outcome: Progressing Goal: Diagnostic test results will improve Outcome: Progressing Goal: Respiratory complications will improve Outcome: Progressing Goal: Cardiovascular complication will be avoided Outcome: Progressing   Problem: Activity: Goal: Risk for activity intolerance will decrease Outcome: Progressing   Problem: Coping: Goal: Level of anxiety will decrease Outcome: Progressing   Problem: Elimination: Goal: Will not experience complications related to bowel motility Outcome: Progressing Goal: Will not experience complications related to urinary retention Outcome: Progressing   Problem: Pain Management: Goal: General experience of comfort will improve Outcome: Progressing   Problem: Safety: Goal: Ability to remain free from injury will improve Outcome: Progressing   Problem: Skin Integrity: Goal: Risk for impaired skin integrity will decrease Outcome: Progressing   Problem: Education: Goal: Will demonstrate proper wound care and an understanding of methods to prevent future damage Outcome: Progressing Goal: Knowledge of disease or condition will improve Outcome: Progressing Goal: Knowledge of the prescribed therapeutic regimen will improve Outcome: Progressing Goal: Individualized Educational Video(s) Outcome: Progressing   Problem: Activity: Goal: Risk for activity intolerance will decrease Outcome: Progressing   Problem: Cardiac: Goal: Will achieve and/or maintain hemodynamic stability Outcome: Progressing   Problem: Clinical Measurements: Goal: Postoperative complications will be avoided or minimized Outcome: Progressing   Problem: Respiratory: Goal: Respiratory status will improve Outcome: Progressing   Problem: Skin Integrity: Goal: Wound healing without signs and  symptoms of infection Outcome: Progressing Goal: Risk for impaired skin integrity will decrease Outcome: Progressing   Problem: Urinary Elimination: Goal: Ability to achieve and maintain adequate renal perfusion and functioning will improve Outcome: Progressing   Problem: Education: Goal: Knowledge of disease or condition will improve Outcome: Progressing Goal: Knowledge of the prescribed therapeutic regimen will improve Outcome: Progressing   Problem: Activity: Goal: Risk for activity intolerance will decrease Outcome: Progressing   Problem: Cardiac: Goal: Will achieve and/or maintain hemodynamic stability Outcome: Progressing   Problem: Clinical Measurements: Goal: Postoperative complications will be avoided or minimized Outcome: Progressing   Problem: Respiratory: Goal: Respiratory status will improve Outcome: Progressing   Problem: Pain Management: Goal: Pain level will decrease Outcome: Progressing   Problem: Skin Integrity: Goal: Wound healing without signs and symptoms infection will improve Outcome: Progressing

## 2023-02-21 NOTE — TOC Progression Note (Signed)
Transition of Care [facility]) - Progression Note    Patient Details  Name: Jerry [name] MRN: [id number] Date of Birth: 1965/08/08  [facility]) CM/SW Contact  Delilah Shan, LCSWA Phone Number: 02/21/2023, 4:33 PM  Clinical Narrative:     CSW spoke with Jill Side with [facility] rehab who confirmed they can offer SNF bed for patient.CSW spoke with patient at bedside. CSW provided SNF bed offers to patient. Patient accepted SNF bed offer with [facility]. All questions answered. No further questions reported at this time.  Expected Discharge Plan: Skilled Nursing Facility Barriers to Discharge: Continued Medical Work up  Expected Discharge Plan and Services In-house Referral: Clinical Social Work Discharge Planning Services: CM Consult Post Acute Care Choice: Home Health, Skilled Nursing Facility Living arrangements for the past 2 months: Single Family Home                 DME Arranged: 3-N-1, Walker rolling         HH Arranged: RN, PT, OT           Social Determinants of Health (SDOH) Interventions SDOH Screenings   Food Insecurity: No Food Insecurity (02/09/2023)  Housing: Low Risk  (02/09/2023)  Transportation Needs: No Transportation Needs (02/09/2023)  Utilities: Not At Risk (02/09/2023)  Depression (PHQ2-9): Low Risk  (05/11/2020)  Tobacco Use: Medium Risk (02/20/2023)    Readmission Risk Interventions     No data to display

## 2023-02-21 NOTE — Progress Notes (Signed)
Patient ID: Jerry [name], male   DOB: September 22, 1965, 58 y.o.   MRN: [id number]  TCTS Evening Rounds:  Hemodynamically stable in sinus rhythm. Some sinus brady into the 50's.   Up in chair and ambulated.  UO ok  CT output minimal

## 2023-02-21 NOTE — Addendum Note (Signed)
Addendum  created 02/21/23 1323 by Lewie Loron, MD   Clinical Note Signed, Intraprocedure Blocks edited

## 2023-02-21 NOTE — Progress Notes (Signed)
Physical Therapy Re-evaluation  Patient Details Name: Jerry [name] MRN: [id number] DOB: 18-Oct-1965 Today's Date: 02/21/2023   History of Present Illness 58 yo male admitted 02/07/23 with severe AS. S/p AVR on 1/3. S/p TEE and subxyphoid pericardial window and insertion of Left pleural chest tube on 02/20/23.  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    PT Comments  Pt with pericardial effusion and underwent insertion of L pleural chest tube on 02/20/23. Pt transferred to ICU. Pt with increased pain due to chest tube and noted decrease in activity tolerance however expected due to yesterdays procedure. Continue to recommend inpatient rehab < 3 hrs/day to allow for increased time for pt to achieve safe mod I level of function.   If plan is discharge home, recommend the following: A little help with walking and/or transfers;A little help with bathing/dressing/bathroom;Assistance with cooking/housework;Direct supervision/assist for financial management;Assist for transportation;Supervision due to cognitive status   Can travel by private vehicle     Yes  Equipment Recommendations  Rolling walker (2 wheels);BSC/3in1    Recommendations for Other Services       Precautions / Restrictions Precautions Precautions: Sternal;Fall;Other (comment) Precaution Booklet Issued: No Precaution Comments: chest tube; able to state sternal precautions Restrictions Weight Bearing Restrictions Per Provider Order: Yes RUE Weight Bearing Per Provider Order: Non weight bearing LUE Weight Bearing Per Provider Order: Non weight bearing Other Position/Activity Restrictions: sternal precautions     Mobility  Bed Mobility               General bed mobility comments: pt received sitting up in recliner, returned to EOB with OT    Transfers Overall transfer level: Needs assistance Equipment used: None Transfers: Sit to/from Stand Sit to Stand: Min assist           General  transfer comment: verbal cues to hug pillow, minA for initial power up    Ambulation/Gait Ambulation/Gait assistance: Min assist, +2 safety/equipment (2nd person for chair follow) Gait Distance (Feet): 190 Feet Assistive device: Fara Boros Gait Pattern/deviations: Step-through pattern, Decreased stride length Gait velocity: Decreased     General Gait Details: pt with frequent standing rest breaks, verbal cues to not lean on EVA walker, stand up tall, noted SOB, SPO2 > 94% on 3LO2 via [place], minA for walker management   Stairs             Wheelchair Mobility     Tilt Bed    Modified Rankin (Stroke Patients Only)       Balance Overall balance assessment: Needs assistance Sitting-balance support: No upper extremity supported, Feet supported Sitting balance-Leahy Scale: Good     Standing balance support: No upper extremity supported, During functional activity Standing balance-Leahy Scale: Fair Standing balance comment: can static stand without UE support with wide base of support but requires RW for safe ambulation                            Cognition Arousal: Alert Behavior During Therapy: WFL for tasks assessed/performed [facility]) Overall Cognitive Status: No family/caregiver present to determine baseline cognitive functioning Area of Impairment: Attention, Memory, Following commands, Safety/judgement, Awareness, Problem solving                   Current Attention Level: Sustained Memory: Decreased short-term memory Following Commands: Follows one step commands with increased time Safety/Judgement: Decreased awareness of safety, Decreased awareness of deficits Awareness: Emergent Problem Solving: Slow  processing, Decreased initiation, Requires verbal cues General Comments: AAOx4 and pleasant throughout session; aware of previous TBI which has impaired cognition at baseline        Exercises      General Comments General comments (skin  integrity, edema, etc.): VSS      Pertinent Vitals/Pain Pain Assessment Pain Assessment: 0-10 Pain Score: 6  Pain Location: chest tube site Pain Descriptors / Indicators: Sore Pain Intervention(s): Monitored during session    Home Living                          Prior Function            PT Goals (current goals can now be found in the care plan section) Acute Rehab PT Goals Patient Stated Goal: return home PT Goal Formulation: With patient Time For Goal Achievement: 03/07/23 Potential to Achieve Goals: Good Progress towards PT goals: Not progressing toward goals - comment (has another surgery yesterday/transferred to ICU)    Frequency    Min 1X/week      PT Plan      Co-evaluation              AM-PAC PT "6 Clicks" Mobility   Outcome Measure  Help needed turning from your back to your side while in a flat bed without using bedrails?: A Little Help needed moving from lying on your back to sitting on the side of a flat bed without using bedrails?: A Little Help needed moving to and from a bed to a chair (including a wheelchair)?: A Little Help needed standing up from a chair using your arms (e.g., wheelchair or bedside chair)?: A Little Help needed to walk in hospital room?: A Little Help needed climbing 3-5 steps with a railing? : A Lot 6 Click Score: 17    End of Session Equipment Utilized During Treatment: Oxygen (2LO2 via [place]) Activity Tolerance: Patient tolerated treatment well Patient left:  (sitting EOB with OT) Nurse Communication: Mobility status PT Visit Diagnosis: Other abnormalities of gait and mobility (R26.89);Difficulty in walking, not elsewhere classified (R26.2)     Time: 1610-9604 PT Time Calculation (min) (ACUTE ONLY): 27 min  Charges:      PT General Charges $$ ACUTE PT VISIT: 1 Visit                     Jerry [name], PT, DPT Acute Rehabilitation Services Secure chat preferred Office #: [phone]    Jerry [name] 02/21/2023, 12:20 PM

## 2023-02-21 NOTE — Progress Notes (Signed)
  Patient Name: Jerry [name] Date of Encounter: 02/21/2023  Primary Cardiologist: Marjo Bicker, MD Electrophysiologist: None  Interval Summary   Has questions regarding new incision Sitting up in chair, denies chest pain, chest pressure, SOB  Vital Signs    Vitals:   02/21/23 0645 02/21/23 0700 02/21/23 0747 02/21/23 0800  BP:   104/64 111/64  Pulse: (!) 133 (!) 117 65 65  Resp: (!) 27 (!) 27 18 (!) 21  Temp:   97.8 F (36.6 C)   TempSrc:   Oral   SpO2: 91% 92% 98% 98%  Weight:      Height:        Intake/Output Summary (Last 24 hours) at 02/21/2023 0808 Last data filed at 02/21/2023 0800 Gross per 24 hour  Intake 2650.02 ml  Output 1508 ml  Net 1142.02 ml   Filed Weights   02/20/23 0655 02/20/23 1220 02/21/23 0500  Weight: 109.9 kg 110.9 kg 108.2 kg    Physical Exam    GEN- The patient is well appearing, alert and oriented x 3 today.   Lungs- Clear to ausculation bilaterally, normal work of breathing Cardiac- Regular rate and rhythm, no murmurs, rubs or gallops GI- soft, NT, ND, + BS Extremities- no clubbing or cyanosis. No edema  Telemetry    NSR 60-80s with episodes of AF (personally reviewed)  Hospital Course    Jerry [name] is a 58 y.o. male with a history of HLD, non-obs CAD, brain injury in 1984 iso sinus infection, seizure disorder, severe aortic stenosis w bicuspid aortic valve s/p AVR who EP is following for AF  1/15 - pericardial window with ~671ml blood   Assessment & Plan    #) Paroxysmal AF #) s/p AVR #) pericardial effusion s/p pericardial window Continue amiodarone 400 mg BID to continue loading Pericardial effusion likely exacerbating heart rhythms Suspect that burden will continue to improve as he recovers from surgery and pericardial window procedure.  CHA2DS2VASC2 is 2 for CAD (mild non-obstructive and HTN.) Recommend starting OAC once CTS feels it is appropriate - 5mg  eliquis BID or 20mg  xarelto daily, whichever is  most affordable for patient   PACs PVCs Improved as of late Continue 10mg  bisoprolol as BP allows     For questions or updates, please contact [facility] Please consult [email or web address] for contact info under Cardiology/STEMI.  Signed, Sherie Don, NP  02/21/2023, 8:08 AM

## 2023-02-21 NOTE — Progress Notes (Signed)
1 Day Post-Op Procedure(s) (LRB): SUBXYPHOID PERICARDIAL WINDOW (N/A) TRANSESOPHAGEAL ECHOCARDIOGRAM (TEE) (N/A) Subjective: No specific complaints. Wants to eat.  Objective: Vital signs in last 24 hours: Temp:  [97.6 F (36.4 C)-99.2 F (37.3 C)] 98 F (36.7 C) (01/16 0337) Pulse Rate:  [49-133] 117 (01/16 0700) Cardiac Rhythm: Normal sinus rhythm (01/16 0000) Resp:  [16-38] 27 (01/16 0700) BP: (85-131)/(49-94) 117/56 (01/16 0615) SpO2:  [87 %-98 %] 92 % (01/16 0700) Arterial Line BP: (59-161)/(38-115) 95/58 (01/16 0700) Weight:  [108.2 kg-110.9 kg] 108.2 kg (01/16 0500)  Hemodynamic parameters for last 24 hours:    Intake/Output from previous day: 01/15 0701 - 01/16 0700 In: 2594.9 [I.V.:2394.9; IV Piggyback:200] Out: 1508 [Urine:1248; Chest Tube:260] Intake/Output this shift: No intake/output data recorded.  General appearance: alert and cooperative Neurologic: intact Heart: regular rate and rhythm Lungs: clear to auscultation bilaterally Extremities: edema mild in legs Wound: incision ok CT output low.  Lab Results: Recent Labs    02/20/23 2119 02/21/23 0432  WBC 18.7* 22.8*  HGB 8.1* 9.0*  HCT 26.5* 28.8*  PLT 389 414*   BMET:  Recent Labs    02/19/23 0322 02/20/23 0922 02/21/23 0432  NA 137 139 134*  K 5.0 5.0 3.8  CL 103  --  101  CO2 22  --  25  GLUCOSE 106*  --  115*  BUN 26*  --  46*  CREATININE 1.52*  --  1.72*  CALCIUM 9.0  --  7.7*    PT/INR:  Recent Labs    02/20/23 0411  LABPROT 18.6*  INR 1.5*   ABG    Component Value Date/Time   PHART 7.261 (L) 02/20/2023 0922   HCO3 13.1 (L) 02/20/2023 0922   TCO2 14 (L) 02/20/2023 0922   ACIDBASEDEF 13.0 (H) 02/20/2023 0922   O2SAT 99 02/20/2023 0922   CBG (last 3)  Recent Labs    02/20/23 2013 02/20/23 2347 02/21/23 0338  GLUCAP 107* 119* 128*   CXR: clear  Assessment/Plan: S/P Procedure(s) (LRB): SUBXYPHOID PERICARDIAL WINDOW (N/A) TRANSESOPHAGEAL ECHOCARDIOGRAM (TEE)  (N/A)  Hemodynamically stable this am but had some hypotension and bradycardia overnight. Had brief atrial fib this am and had some initially postop. Will increase amio to 400 bid and hold off on further beta blocker.  Creat up postop: hold off on diuresis today. He has CKD.  Keep chest tubes today. DC arterial line.  IS, OOB, mobilize.  Leukocytosis most likely due to steroid given by anesthesia before AVR which I did not know about. Continue to follow.   LOS: 13 days    Alleen Borne 02/21/2023

## 2023-02-21 NOTE — Progress Notes (Signed)
Occupational Therapy Re-Evaluation Patient Details Name: Jerry [name] MRN: [id number] DOB: 08-Jul-1965 Today's Date: 02/21/2023   History of present illness 58 yo male admitted 02/07/23 with severe AS. S/p AVR on 1/3. S/p TEE and subxyphoid pericardial window and insertion of Left pleural chest tube on 02/20/23.  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   OT comments  Pt with pericardial effusion and underwent insertion of L pleural chest tube on 02/20/23 and transferred to ICU. Pt now with increased pain due to chest tube and noted decrease in activity tolerance; however, this is expected due to procedure. Pt currently demonstrates ability to complete UB ADLs largely with Set up to Min assist, LB ADLs with Mod to Max assist, and functional transfers/mobility with an Scientist, research (medical) with Min assist +2 for chair follow all while adhering to sternal precautions. VS largely stable on 3L continuous O2 through HFNC with one brief episode of tachycardia up to 130 bpm when transferring sit to sidelying with pt HR recovering quickly with rest. Pt participated well in session and is motivated to return to PLOF. Acute OT goals remain appropriate and OT continues to recommend post-acute intensive inpatient skilled rehab services < 3 hours per day to maximize rehab potential.       If plan is discharge home, recommend the following:  A little help with walking and/or transfers;A lot of help with bathing/dressing/bathroom;Assistance with cooking/housework;Direct supervision/assist for medications management;Direct supervision/assist for financial management;Assist for transportation;Help with stairs or ramp for entrance;Supervision due to cognitive status   Equipment Recommendations  Other (comment) (defer to next level of care)    Recommendations for Other Services      Precautions / Restrictions Precautions Precautions: Sternal;Fall;Other (comment) Precaution Booklet Issued:  No Precaution Comments: chest tube; able to state sternal precautions Restrictions Weight Bearing Restrictions Per Provider Order: Yes RUE Weight Bearing Per Provider Order: Non weight bearing LUE Weight Bearing Per Provider Order: Non weight bearing Other Position/Activity Restrictions: sternal precautions       Mobility Bed Mobility Overal bed mobility: Needs Assistance Bed Mobility: Sit to Sidelying, Rolling Rolling: Contact guard assist, Min assist (while holding heart pillow)       Sit to sidelying: Mod assist, HOB elevated (while holding heart pillow) General bed mobility comments: Pt with signs of mild anxiety when transferring from sit to sidelying and beginning to roll onto his back with pt reporting a fear of falling off the bed and benefiting from reassurance that bed rails were up behind him and that he was fully on the bed.    Transfers Overall transfer level: Needs assistance Equipment used: None Marketing executive walker) Transfers: Sit to/from Stand, Bed to chair/wheelchair/BSC Sit to Stand: Min assist (holding heart pillow)     Step pivot transfers: Min assist (with use of Eva walker)     General transfer comment: holding heart pillow during STS and with Min assist to power up     Balance Overall balance assessment: Needs assistance Sitting-balance support: No upper extremity supported, Feet supported Sitting balance-Leahy Scale: Good     Standing balance support: No upper extremity supported, During functional activity Standing balance-Leahy Scale: Fair Standing balance comment: can static stand without UE support with wide base of support but requires RW for safe ambulation                           ADL either performed or assessed with clinical judgement   ADL Overall  ADL's : Needs assistance/impaired Eating/Feeding: Set up;Sitting   Grooming: Set up;Supervision/safety;Sitting           Upper Body Dressing : Contact guard assist;Minimal  assistance;Cueing for compensatory techniques;Sitting (adhering to sternal precations)   Lower Body Dressing: Moderate assistance;Maximal assistance;Sitting/lateral leans;Sit to/from stand;Cueing for sequencing;Cueing for compensatory techniques (adhering to sternal precautions)   Toilet Transfer: Minimal assistance;Contact guard assist;Cueing for safety;Cueing for sequencing;Ambulation;BSC/3in1 Carley Hammed walker; Min assist to power up while holding heart pillow; Largely CGA with occasiona Min assist needed to maintain balance during transfer) Toilet Transfer Details (indicate cue type and reason): simulated chair to bed Toileting- Clothing Manipulation and Hygiene: Maximal assistance;Sit to/from stand;Cueing for compensatory techniques (adhering to sternal precautions)       Functional mobility during ADLs: Minimal assistance;Contact guard assist;+2 for safety/equipment Carley Hammed walker; Min assist to power up while holding heart pillow; Largely CGA with occasiona Min assist needed to maintain balance during functional mobility; +2 for chair follow) General ADL Comments: OT reedcuated pt in techniques for increased safety and independence with UB/LB dressing whiel "moving in the tube" with pt verbalizing understanding and requiring min cuesto demonstrate techniques during teach back. Decreased cognition, decreased activity tolerance, and pain also affecting pt's funcitonal level this day    Extremity/Trunk Assessment Upper Extremity Assessment Upper Extremity Assessment: Generalized weakness (tested within sternal precautions)   Lower Extremity Assessment Lower Extremity Assessment: Defer to PT evaluation        Vision       Perception     Praxis      Cognition Arousal: Alert Behavior During Therapy: [facility] for tasks assessed/performed, Flat affect, Anxious (largely WFL but with flat affect and signs of mild anxiety with pt reporting fear of falling when transferring to bed) Overall Cognitive  Status: No family/caregiver present to determine baseline cognitive functioning Area of Impairment: Attention, Memory, Following commands, Safety/judgement, Awareness, Problem solving                   Current Attention Level: Sustained Memory: Decreased short-term memory Following Commands: Follows one step commands with increased time, Follows multi-step commands inconsistently Safety/Judgement: Decreased awareness of safety, Decreased awareness of deficits Awareness: Emergent Problem Solving: Slow processing, Decreased initiation, Requires verbal cues General Comments: AAOx4 and pleasant throughout session; demonstrates good carryover of prior training in sternal precations        Exercises      Shoulder Instructions       General Comments Pt's O2 sat stable on 3L coninuous O2 through HFNC throughout session. Pt's HR largely in the 70s to low 100s with a brief episode of tachycardia up to 130 bpm when transferring sit to sidelying. HR recovered to 90s quickly with rest break in supine. BP soft but stable throughout session. Pt reported dizziness upon initially sitting up from reclined position in chair with VSS. PT, RN. and NT each present during a portion of session.    Pertinent Vitals/ Pain       Pain Assessment Pain Assessment: Faces Faces Pain Scale: Hurts even more Pain Location: sternal incision Pain Descriptors / Indicators: Grimacing, Aching, Sore Pain Intervention(s): Limited activity within patient's tolerance, Monitored during session, Repositioned  Home Living                                          Prior Functioning/Environment  Frequency  Min 1X/week        Progress Toward Goals  OT Goals(current goals can now be found in the care plan section)  Progress towards OT goals: Progressing toward goals  Acute Rehab OT Goals Patient Stated Goal: to return home  Plan      Co-evaluation                  AM-PAC OT "6 Clicks" Daily Activity     Outcome Measure   Help from another person eating meals?: A Little Help from another person taking care of personal grooming?: A Little Help from another person toileting, which includes using toliet, bedpan, or urinal?: A Lot Help from another person bathing (including washing, rinsing, drying)?: A Lot Help from another person to put on and taking off regular upper body clothing?: A Little Help from another person to put on and taking off regular lower body clothing?: A Lot 6 Click Score: 15    End of Session Equipment Utilized During Treatment: Gait belt;Other (comment);Oxygen (Eva walker; heart pillow)  OT Visit Diagnosis: Unsteadiness on feet (R26.81);Other abnormalities of gait and mobility (R26.89);Muscle weakness (generalized) (M62.81);Other (comment) (decreased activity tolerance)   Activity Tolerance Patient tolerated treatment well   Patient Left in bed;with call bell/phone within reach;with bed alarm set   Nurse Communication Mobility status        Time: 1610-9604 OT Time Calculation (min): 39 min  Charges: OT General Charges $OT Visit: 1 Visit OT Evaluation $OT Re-eval: 1 Re-eval OT Treatments $Self Care/Home Management : 8-22 mins $Therapeutic Activity: 8-22 mins  Keaston Pile "Orson Eva., OTR/L, MA Acute Rehab [phone]   Lendon Colonel 02/21/2023, 1:53 PM

## 2023-02-22 ENCOUNTER — Inpatient Hospital Stay (HOSPITAL_COMMUNITY): Payer: BC Managed Care – PPO

## 2023-02-22 LAB — TYPE AND SCREEN
ABO/RH(D): A POS
Antibody Screen: POSITIVE
Donor AG Type: NEGATIVE
Donor AG Type: NEGATIVE
Donor AG Type: NEGATIVE
Donor AG Type: NEGATIVE
PT AG Type: NEGATIVE
Unit division: 0
Unit division: 0
Unit division: 0
Unit division: 0
Unit division: 0
Unit division: 0

## 2023-02-22 LAB — BPAM RBC
Blood Product Expiration Date: 202501152359
Blood Product Expiration Date: 202501302359
Blood Product Expiration Date: 202501302359
Blood Product Expiration Date: 202501302359
Blood Product Expiration Date: 202501302359
Blood Product Expiration Date: 202501302359
ISSUE DATE / TIME: 202501152214
Unit Type and Rh: 6200
Unit Type and Rh: 6200
Unit Type and Rh: 6200
Unit Type and Rh: 6200
Unit Type and Rh: 6200
Unit Type and Rh: 6200

## 2023-02-22 LAB — LIPID PANEL
Cholesterol: 81 mg/dL (ref 0–200)
HDL: 14 mg/dL — ABNORMAL LOW (ref >40)
LDL Cholesterol: 39 mg/dL (ref 0–99)
Total CHOL/HDL Ratio: 5.8 {ratio}
Triglycerides: 140 mg/dL (ref <150)
VLDL: 28 mg/dL (ref 0–40)

## 2023-02-22 LAB — CBC
HCT: 28.3 % — ABNORMAL LOW (ref 39.0–52.0)
Hemoglobin: 8.7 g/dL — ABNORMAL LOW (ref 13.0–17.0)
MCH: 34.9 pg — ABNORMAL HIGH (ref 26.0–34.0)
MCHC: 30.7 g/dL (ref 30.0–36.0)
MCV: 113.7 fL — ABNORMAL HIGH (ref 80.0–100.0)
Platelets: 297 10*3/uL (ref 150–400)
RBC: 2.49 MIL/uL — ABNORMAL LOW (ref 4.22–5.81)
RDW: 19.9 % — ABNORMAL HIGH (ref 11.5–15.5)
WBC: 16.9 10*3/uL — ABNORMAL HIGH (ref 4.0–10.5)
nRBC: 0.5 % — ABNORMAL HIGH (ref 0.0–0.2)

## 2023-02-22 LAB — BASIC METABOLIC PANEL
Anion gap: 8 (ref 5–15)
BUN: 37 mg/dL — ABNORMAL HIGH (ref 6–20)
CO2: 25 mmol/L (ref 22–32)
Calcium: 8 mg/dL — ABNORMAL LOW (ref 8.9–10.3)
Chloride: 100 mmol/L (ref 98–111)
Creatinine, Ser: 1.49 mg/dL — ABNORMAL HIGH (ref 0.61–1.24)
GFR, Estimated: 55 mL/min — ABNORMAL LOW (ref >60)
Glucose, Bld: 90 mg/dL (ref 70–99)
Potassium: 4.4 mmol/L (ref 3.5–5.1)
Sodium: 133 mmol/L — ABNORMAL LOW (ref 135–145)

## 2023-02-22 LAB — GLUCOSE, CAPILLARY
Glucose-Capillary: 97 mg/dL (ref 70–99)
Glucose-Capillary: 85 mg/dL (ref 70–99)

## 2023-02-22 MED ORDER — POLYETHYLENE GLYCOL 3350 17 G PO PACK
17.0000 g | PACK | Freq: Every day | ORAL | Status: DC
  Administered 2023-02-22: 17 g via ORAL
  Filled 2023-02-22: qty 1

## 2023-02-22 MED ORDER — MORPHINE SULFATE (PF) 2 MG/ML IV SOLN: 1.0000 mg | INTRAVENOUS | Status: DC | PRN

## 2023-02-22 MED ORDER — FUROSEMIDE 40 MG PO TABS
40.0000 mg | ORAL_TABLET | Freq: Every day | ORAL | Status: AC
  Administered 2023-02-22 – 2023-02-24 (×3): 40 mg via ORAL
  Filled 2023-02-22 (×3): qty 1

## 2023-02-22 MED ORDER — METOPROLOL TARTRATE 12.5 MG HALF TABLET
12.5000 mg | ORAL_TABLET | Freq: Two times a day (BID) | ORAL | Status: DC
  Administered 2023-02-22 – 2023-03-05 (×23): 12.5 mg via ORAL
  Filled 2023-02-22 (×23): qty 1

## 2023-02-22 MED ORDER — POTASSIUM CHLORIDE CRYS ER 20 MEQ PO TBCR
20.0000 meq | EXTENDED_RELEASE_TABLET | Freq: Every day | ORAL | Status: AC
  Administered 2023-02-22 – 2023-02-24 (×3): 20 meq via ORAL
  Filled 2023-02-22 (×3): qty 1

## 2023-02-22 NOTE — Progress Notes (Signed)
2 Days Post-Op Procedure(s) (LRB): SUBXYPHOID PERICARDIAL WINDOW (N/A) TRANSESOPHAGEAL ECHOCARDIOGRAM (TEE) (N/A) Subjective:  No complaints. Ambulated this am. Sitting up eating breakfast.  Had run of atrial fib last pm terminated quickly with amio bolus.  Objective: Vital signs in last 24 hours: Temp:  [97.8 F (36.6 C)-99.1 F (37.3 C)] 98.4 F (36.9 C) (01/17 0342) Pulse Rate:  [49-116] 61 (01/17 0500) Cardiac Rhythm: Normal sinus rhythm;Atrial fibrillation (01/16 2000) Resp:  [17-33] 20 (01/17 0500) BP: (96-147)/(47-96) 104/58 (01/17 0500) SpO2:  [90 %-100 %] 98 % (01/17 0500) Arterial Line BP: (71-116)/(48-106) 116/106 (01/16 1100) Weight:  [107.4 kg] 107.4 kg (01/17 0500)  Hemodynamic parameters for last 24 hours:    Intake/Output from previous day: 01/16 0701 - 01/17 0700 In: 55.1 [I.V.:55.1] Out: 1340 [Urine:1120; Chest Tube:220] Intake/Output this shift: No intake/output data recorded.  General appearance: alert and cooperative Neurologic: intact Heart: regular rate and rhythm Lungs: clear to auscultation bilaterally Extremities: edema mild in legs Wound: dressing dry  Lab Results: Recent Labs    02/21/23 0432 02/22/23 0501  WBC 22.8* 16.9*  HGB 9.0* 8.7*  HCT 28.8* 28.3*  PLT 414* 297   BMET:  Recent Labs    02/21/23 0432 02/22/23 0501  NA 134* 133*  K 3.8 4.4  CL 101 100  CO2 25 25  GLUCOSE 115* 90  BUN 46* 37*  CREATININE 1.72* 1.49*  CALCIUM 7.7* 8.0*    PT/INR:  Recent Labs    02/20/23 0411  LABPROT 18.6*  INR 1.5*   ABG    Component Value Date/Time   PHART 7.261 (L) 02/20/2023 0922   HCO3 13.1 (L) 02/20/2023 0922   TCO2 14 (L) 02/20/2023 0922   ACIDBASEDEF 13.0 (H) 02/20/2023 0922   O2SAT 99 02/20/2023 0922   CBG (last 3)  Recent Labs    02/21/23 1615 02/21/23 2125 02/22/23 0602  GLUCAP 107* 97 97    Assessment/Plan: POD 14 AVR  POD 2 S/P Procedure(s) (LRB): SUBXYPHOID PERICARDIAL WINDOW  (N/A) TRANSESOPHAGEAL ECHOCARDIOGRAM (TEE) (N/A)  Hemodynamically stable. Still have some atrial fib with RVR. Will add low dose BB to amio since BP is better. Hold of on anticoagulation with recent bloody pericardial effusion. Likely had some postop pericarditis.  DC pericardial and pleural tube today.  Transfer to 4E and continue IS, ambulation.  Wt is about at preop but has some edema in legs. Will diurese for a few days.   LOS: 14 days    Jerry [name] 02/22/2023

## 2023-02-22 NOTE — Progress Notes (Signed)
  Patient Name: Jerry [name] Date of Encounter: 02/22/2023  Primary Cardiologist: Marjo Bicker, MD Electrophysiologist: New to Dr. Elberta Fortis  Interval Summary   No new complaints. Note plans to d/c pericardial and pleural tube today.   Vital Signs    Vitals:   02/22/23 0645 02/22/23 0700 02/22/23 0715 02/22/23 0730  BP: (!) 142/57 (!) 118/100    Pulse: 73 73 74 73  Resp: (!) 27 (!) 23 15 (!) 30  Temp:    97.7 F (36.5 C)  TempSrc:    Oral  SpO2: 95% (!) 86% 96% 97%  Weight:      Height:        Intake/Output Summary (Last 24 hours) at 02/22/2023 0841 Last data filed at 02/22/2023 0600 Gross per 24 hour  Intake --  Output 1290 ml  Net -1290 ml   Filed Weights   02/20/23 1220 02/21/23 0500 02/22/23 0500  Weight: 110.9 kg 108.2 kg 107.4 kg    Physical Exam    GEN- The patient is well appearing, alert and oriented x 3 today.   Lungs- Clear to ausculation bilaterally, normal work of breathing Cardiac- Regular rate and rhythm, no murmurs, rubs or gallops GI- soft, NT, ND, + BS Extremities- no clubbing or cyanosis. No edema  Telemetry    NSR apart from 1 hr of AF overnight, though rates better (personally reviewed)  Hospital Course    Jerry [name] is a 58 y.o. male with a history of HLD, non-obs CAD, brain injury in 1984 iso sinus infection, seizure disorder, severe aortic stenosis w bicuspid aortic valve s/p AVR who EP is following for AF   1/15 - pericardial window with ~668ml blood  Assessment & Plan    Paroxysmal AF s/p AVR Pericardial effusion s/p pericardial window Continue amiodarone 400 mg BID x 1 week, 400 mg daily x 1 week, then 200 mg daily.  Pericardial effusion likely exacerbated heart rhythms Suspect that burden will continue to improve as he recovers from surgery and pericardial window procedure.  CHA2DS2VASC2 is 2 for CAD (mild non-obstructive and HTN.) Recommend starting OAC once CTS feels it is appropriate - 5mg  eliquis BID or 20mg   xarelto daily, whichever is most affordable for patient  Could potentially stop amiodarone at 3 month mark for post operative AF.    PACs PVCs Improved as of late Agree with adding back BB as tolerated.   EP will likely continue to see only as needed while remains here. Please call back with additional questions or concerns.   For questions or updates, please contact [facility] Please consult [email or web address] for contact info under Cardiology/STEMI.  Signed, Graciella Freer, PA-C  02/22/2023, 8:41 AM

## 2023-02-22 NOTE — Progress Notes (Signed)
      [address]       [place] [address]             [phone]      Up in chair  BP 99/62   Pulse 68   Temp (!) 97.5 F (36.4 C) (Oral)   Resp (!) 23   Ht 5\' 11"  (1.803 m)   Wt 107.4 kg   SpO2 95%   BMI 33.02 kg/m  In SR   Intake/Output Summary (Last 24 hours) at 02/22/2023 1636 Last data filed at 02/22/2023 1500 Gross per 24 hour  Intake 300 ml  Output 1665 ml  Net -1365 ml   Awaiting bed on 4E  Paolo Okane C. Dorris Fetch, MD [facility] [phone]

## 2023-02-22 NOTE — Plan of Care (Signed)
  Problem: Clinical Measurements: Goal: Ability to maintain clinical measurements within normal limits will improve Outcome: Progressing Goal: Will remain free from infection Outcome: Progressing Goal: Diagnostic test results will improve Outcome: Progressing Goal: Respiratory complications will improve Outcome: Progressing Goal: Cardiovascular complication will be avoided Outcome: Progressing   Problem: Activity: Goal: Risk for activity intolerance will decrease Outcome: Progressing   Problem: Nutrition: Goal: Adequate nutrition will be maintained Outcome: Progressing   Problem: Coping: Goal: Level of anxiety will decrease Outcome: Progressing   Problem: Elimination: Goal: Will not experience complications related to bowel motility Outcome: Progressing Goal: Will not experience complications related to urinary retention Outcome: Progressing   Problem: Pain Management: Goal: General experience of comfort will improve Outcome: Progressing   Problem: Safety: Goal: Ability to remain free from injury will improve Outcome: Progressing   Problem: Skin Integrity: Goal: Risk for impaired skin integrity will decrease Outcome: Progressing   Problem: Education: Goal: Will demonstrate proper wound care and an understanding of methods to prevent future damage Outcome: Progressing Goal: Knowledge of disease or condition will improve Outcome: Progressing Goal: Knowledge of the prescribed therapeutic regimen will improve Outcome: Progressing Goal: Individualized Educational Video(s) Outcome: Progressing   Problem: Activity: Goal: Risk for activity intolerance will decrease Outcome: Progressing   Problem: Cardiac: Goal: Will achieve and/or maintain hemodynamic stability Outcome: Progressing   Problem: Clinical Measurements: Goal: Postoperative complications will be avoided or minimized Outcome: Progressing   Problem: Respiratory: Goal: Respiratory status will  improve Outcome: Progressing   Problem: Skin Integrity: Goal: Wound healing without signs and symptoms of infection Outcome: Progressing Goal: Risk for impaired skin integrity will decrease Outcome: Progressing   Problem: Urinary Elimination: Goal: Ability to achieve and maintain adequate renal perfusion and functioning will improve Outcome: Progressing   Problem: Education: Goal: Knowledge of disease or condition will improve Outcome: Progressing Goal: Knowledge of the prescribed therapeutic regimen will improve Outcome: Progressing   Problem: Activity: Goal: Risk for activity intolerance will decrease Outcome: Progressing   Problem: Cardiac: Goal: Will achieve and/or maintain hemodynamic stability Outcome: Progressing   Problem: Clinical Measurements: Goal: Postoperative complications will be avoided or minimized Outcome: Progressing   Problem: Respiratory: Goal: Respiratory status will improve Outcome: Progressing   Problem: Pain Management: Goal: Pain level will decrease Outcome: Progressing   Problem: Skin Integrity: Goal: Wound healing without signs and symptoms infection will improve Outcome: Progressing

## 2023-02-23 LAB — BASIC METABOLIC PANEL
Anion gap: 9 (ref 5–15)
BUN: 28 mg/dL — ABNORMAL HIGH (ref 6–20)
CO2: 22 mmol/L (ref 22–32)
Calcium: 8.1 mg/dL — ABNORMAL LOW (ref 8.9–10.3)
Chloride: 105 mmol/L (ref 98–111)
Creatinine, Ser: 1.29 mg/dL — ABNORMAL HIGH (ref 0.61–1.24)
GFR, Estimated: >60 mL/min (ref >60)
Glucose, Bld: 81 mg/dL (ref 70–99)
Potassium: 4.6 mmol/L (ref 3.5–5.1)
Sodium: 136 mmol/L (ref 135–145)

## 2023-02-23 LAB — GLUCOSE, CAPILLARY
Glucose-Capillary: 78 mg/dL (ref 70–99)
Glucose-Capillary: 98 mg/dL (ref 70–99)

## 2023-02-23 MED ORDER — ENSURE ENLIVE PO LIQD
237.0000 mL | Freq: Two times a day (BID) | ORAL | Status: DC
Start: 2023-02-24 — End: 2023-03-05
  Administered 2023-02-24 – 2023-03-02 (×12): 237 mL via ORAL

## 2023-02-23 MED ORDER — AMIODARONE IV BOLUS ONLY 150 MG/100ML
150.0000 mg | Freq: Once | INTRAVENOUS | Status: AC
  Administered 2023-02-23: 150 mg via INTRAVENOUS
  Filled 2023-02-23: qty 100

## 2023-02-23 MED ORDER — ORAL CARE MOUTH RINSE: 15.0000 mL | OROMUCOSAL | Status: DC | PRN

## 2023-02-23 NOTE — Progress Notes (Signed)
Patient converted to sinus rhythm @2309 

## 2023-02-23 NOTE — Plan of Care (Signed)
  Problem: Clinical Measurements: Goal: Ability to maintain clinical measurements within normal limits will improve Outcome: Progressing Goal: Will remain free from infection Outcome: Progressing Goal: Diagnostic test results will improve Outcome: Progressing Goal: Respiratory complications will improve Outcome: Progressing Goal: Cardiovascular complication will be avoided Outcome: Progressing   Problem: Activity: Goal: Risk for activity intolerance will decrease Outcome: Progressing   Problem: Nutrition: Goal: Adequate nutrition will be maintained Outcome: Progressing   Problem: Coping: Goal: Level of anxiety will decrease Outcome: Progressing   Problem: Elimination: Goal: Will not experience complications related to bowel motility Outcome: Progressing Goal: Will not experience complications related to urinary retention Outcome: Progressing   Problem: Pain Management: Goal: General experience of comfort will improve Outcome: Progressing   Problem: Safety: Goal: Ability to remain free from injury will improve Outcome: Progressing   Problem: Skin Integrity: Goal: Risk for impaired skin integrity will decrease Outcome: Progressing   Problem: Education: Goal: Will demonstrate proper wound care and an understanding of methods to prevent future damage Outcome: Progressing Goal: Knowledge of disease or condition will improve Outcome: Progressing Goal: Knowledge of the prescribed therapeutic regimen will improve Outcome: Progressing Goal: Individualized Educational Video(s) Outcome: Progressing   Problem: Activity: Goal: Risk for activity intolerance will decrease Outcome: Progressing   Problem: Cardiac: Goal: Will achieve and/or maintain hemodynamic stability Outcome: Progressing   Problem: Clinical Measurements: Goal: Postoperative complications will be avoided or minimized Outcome: Progressing   Problem: Respiratory: Goal: Respiratory status will  improve Outcome: Progressing   Problem: Skin Integrity: Goal: Wound healing without signs and symptoms of infection Outcome: Progressing Goal: Risk for impaired skin integrity will decrease Outcome: Progressing   Problem: Urinary Elimination: Goal: Ability to achieve and maintain adequate renal perfusion and functioning will improve Outcome: Progressing   Problem: Education: Goal: Knowledge of disease or condition will improve Outcome: Progressing Goal: Knowledge of the prescribed therapeutic regimen will improve Outcome: Progressing   Problem: Activity: Goal: Risk for activity intolerance will decrease Outcome: Progressing   Problem: Cardiac: Goal: Will achieve and/or maintain hemodynamic stability Outcome: Progressing   Problem: Clinical Measurements: Goal: Postoperative complications will be avoided or minimized Outcome: Progressing   Problem: Respiratory: Goal: Respiratory status will improve Outcome: Progressing   Problem: Pain Management: Goal: Pain level will decrease Outcome: Progressing   Problem: Skin Integrity: Goal: Wound healing without signs and symptoms infection will improve Outcome: Progressing

## 2023-02-23 NOTE — Progress Notes (Signed)
@   2031, pt HR elevated to 130's sustaining. Occasional increase to 140's. Pt went into afib @ 2046 per CCMD documented.   Pt given scheduled PO metoprolol and amio @2047 . EKG being done to confirm. Afib RVR.   Pt was bradycardic @ 1847 HR 42.  Will continue to monitor

## 2023-02-23 NOTE — Progress Notes (Signed)
Notified Dr. Dorris Fetch of Afib RVR. 150 mg bolus verbal order

## 2023-02-23 NOTE — Progress Notes (Signed)
3 Days Post-Op Procedure(s) (LRB): SUBXYPHOID PERICARDIAL WINDOW (N/A) TRANSESOPHAGEAL ECHOCARDIOGRAM (TEE) (N/A) Subjective: No complaints this AM  Objective: Vital signs in last 24 hours: Temp:  [97.5 F (36.4 C)-98.1 F (36.7 C)] 97.9 F (36.6 C) (01/18 0811) Pulse Rate:  [56-80] 70 (01/18 0800) Cardiac Rhythm: Normal sinus rhythm (01/18 0400) Resp:  [14-30] 23 (01/18 0800) BP: (88-141)/(48-78) 141/73 (01/18 0800) SpO2:  [89 %-99 %] 96 % (01/18 0800) Weight:  [107.7 kg] 107.7 kg (01/18 0458)  Hemodynamic parameters for last 24 hours:    Intake/Output from previous day: 01/17 0701 - 01/18 0700 In: 300 [P.O.:300] Out: 1450 [Urine:1420; Chest Tube:30] Intake/Output this shift: No intake/output data recorded.  General appearance: alert and cooperative Neurologic: intact Heart: regular rate and rhythm Lungs: diminished breath sounds bibasilar Wound: clean and dry  Lab Results: Recent Labs    02/21/23 0432 02/22/23 0501  WBC 22.8* 16.9*  HGB 9.0* 8.7*  HCT 28.8* 28.3*  PLT 414* 297   BMET:  Recent Labs    02/22/23 0501 02/23/23 0340  NA 133* 136  K 4.4 4.6  CL 100 105  CO2 25 22  GLUCOSE 90 81  BUN 37* 28*  CREATININE 1.49* 1.29*  CALCIUM 8.0* 8.1*    PT/INR: No results for input(s): "LABPROT", "INR" in the last 72 hours. ABG    Component Value Date/Time   PHART 7.261 (L) 02/20/2023 0922   HCO3 13.1 (L) 02/20/2023 0922   TCO2 14 (L) 02/20/2023 0922   ACIDBASEDEF 13.0 (H) 02/20/2023 0922   O2SAT 99 02/20/2023 0922   CBG (last 3)  Recent Labs    02/22/23 0602 02/22/23 2206 02/23/23 0614  GLUCAP 97 85 78    Assessment/Plan: S/P Procedure(s) (LRB): SUBXYPHOID PERICARDIAL WINDOW (N/A) TRANSESOPHAGEAL ECHOCARDIOGRAM (TEE) (N/A) Plan for transfer to step-down: see transfer orders Awaiting bed on 4E In SR on amiodarone and metoprolol No anticoagulation  Creatinine down form 1.5 to 1.3, lytes OK Continue cardiac rehab    LOS: 15 days     Loreli Slot 02/23/2023

## 2023-02-23 NOTE — Plan of Care (Signed)
  Problem: Clinical Measurements: Goal: Ability to maintain clinical measurements within normal limits will improve Outcome: Progressing Goal: Will remain free from infection Outcome: Progressing   

## 2023-02-24 LAB — BASIC METABOLIC PANEL
Anion gap: 11 (ref 5–15)
BUN: 23 mg/dL — ABNORMAL HIGH (ref 6–20)
CO2: 24 mmol/L (ref 22–32)
Calcium: 8.5 mg/dL — ABNORMAL LOW (ref 8.9–10.3)
Chloride: 102 mmol/L (ref 98–111)
Creatinine, Ser: 1.19 mg/dL (ref 0.61–1.24)
GFR, Estimated: >60 mL/min (ref >60)
Glucose, Bld: 93 mg/dL (ref 70–99)
Potassium: 4.1 mmol/L (ref 3.5–5.1)
Sodium: 137 mmol/L (ref 135–145)

## 2023-02-24 LAB — CBC
HCT: 30.4 % — ABNORMAL LOW (ref 39.0–52.0)
Hemoglobin: 9.4 g/dL — ABNORMAL LOW (ref 13.0–17.0)
MCH: 33.9 pg (ref 26.0–34.0)
MCHC: 30.9 g/dL (ref 30.0–36.0)
MCV: 109.7 fL — ABNORMAL HIGH (ref 80.0–100.0)
Platelets: 247 10*3/uL (ref 150–400)
RBC: 2.77 MIL/uL — ABNORMAL LOW (ref 4.22–5.81)
RDW: 17.8 % — ABNORMAL HIGH (ref 11.5–15.5)
WBC: 12.6 10*3/uL — ABNORMAL HIGH (ref 4.0–10.5)
nRBC: 0 % (ref 0.0–0.2)

## 2023-02-24 MED ORDER — LACTULOSE 10 GM/15ML PO SOLN
30.0000 g | Freq: Once | ORAL | Status: AC
  Administered 2023-02-24: 30 g via ORAL
  Filled 2023-02-24: qty 45

## 2023-02-24 MED ORDER — FUROSEMIDE 40 MG PO TABS
40.0000 mg | ORAL_TABLET | Freq: Every day | ORAL | Status: AC
  Administered 2023-02-24 – 2023-02-26 (×3): 40 mg via ORAL
  Filled 2023-02-24 (×3): qty 1

## 2023-02-24 NOTE — Plan of Care (Signed)
  Problem: Activity: Goal: Risk for activity intolerance will decrease Outcome: Progressing   Problem: Nutrition: Goal: Adequate nutrition will be maintained Outcome: Progressing   

## 2023-02-24 NOTE — Progress Notes (Addendum)
[address]       [place] [address]             [phone]     16 Days Post-Op Aortic Valve Replacement   4 Days Post-Op Procedure(s) (LRB): SUBXYPHOID PERICARDIAL WINDOW (N/A) TRANSESOPHAGEAL ECHOCARDIOGRAM (TEE) (N/A) Subjective: Transferred back from ICU yesterday.   Alert and oriented, feeling better overall.  Concerned he has not had a BM for 3 days.   Objective: Vital signs in last 24 hours: Temp:  [97.9 F (36.6 C)-98.3 F (36.8 C)] 98.3 F (36.8 C) (01/19 0757) Pulse Rate:  [58-141] 74 (01/19 0757) Cardiac Rhythm: Atrial fibrillation;Normal sinus rhythm;Other (Comment) (01/18 2309) Resp:  [18-26] 21 (01/19 0757) BP: (102-145)/(40-77) 136/70 (01/19 0757) SpO2:  [91 %-98 %] 97 % (01/19 0757) Weight:  [105.6 kg] 105.6 kg (01/19 0513)     Intake/Output from previous day: 01/18 0701 - 01/19 0700 In: 775.7 [P.O.:600; I.V.:175.7] Out: 1600 [Urine:1600] Intake/Output this shift: No intake/output data recorded.  General appearance: alert, cooperative, and no distress Neurologic: no focal deficits Heart: was back in a-fib from 8pm-~11pm. Treated with an extra bolus of IV amiodarone. SR since then.  Lungs: normal work of breathing and clear breath sounds. Sats stable on RA. Abdomen: soft, no tenderness Extremities: trace LE edema Wound: the sternotomy incision is intact and dry.   Lab Results: Recent Labs    02/22/23 0501 02/24/23 0331  WBC 16.9* 12.6*  HGB 8.7* 9.4*  HCT 28.3* 30.4*  PLT 297 247   BMET:  Recent Labs    02/23/23 0340 02/24/23 0331  NA 136 137  K 4.6 4.1  CL 105 102  CO2 22 24  GLUCOSE 81 93  BUN 28* 23*  CREATININE 1.29* 1.19  CALCIUM 8.1* 8.5*    PT/INR: No results for input(s): "LABPROT", "INR" in the last 72 hours. ABG    Component Value Date/Time   PHART 7.261 (L) 02/20/2023 0922   HCO3 13.1 (L) 02/20/2023 0922   TCO2 14 (L) 02/20/2023 0922   ACIDBASEDEF 13.0 (H) 02/20/2023 0922   O2SAT 99  02/20/2023 0922   CBG (last 3)  Recent Labs    02/22/23 2206 02/23/23 0614 02/23/23 1157  GLUCAP 85 78 98    Assessment/Plan: S/P Procedure(s) (LRB): SUBXYPHOID PERICARDIAL WINDOW (N/A) TRANSESOPHAGEAL ECHOCARDIOGRAM (TEE) (N/A)  -POD16 bioprosthetic AVR for severe bicuspid aortic stenosis -POD4 subxiphoid pericardial drainage of a large pericardial effusion and drainage of left pleural effusion.  Overall continues to improve and gain strength. On ASA, low-dose metoprolol, Crestor.  -Post-op atrial fibrillation- had recurrence last night for about 3 hours with conversion back to SR after additional IV amiodarone bolus.  K+ 4.2. Continue oral amiodarone loading at 400mg  BID.  -History of head injury and Seizure disorder- Depakote and Dilantin resumed. Neuro status at baseline.   -Renal- mild renal insuffiencey post-op- creat has normalized post drainage of the pericardial effusion.  -Expected acute blood loss anemia- Hct trending up, continue Fe++ replacement.  -GI- no BM in a few days. Will re-order lactulose.   -Disposition- living alone prior to surgery. PT /OT recommended short-term SNF prior to returning home and Mr. Jerry [name] has agreed to this plan. He is medically stable to resume discharge planning for SNF.   LOS: 16 days    Leary Roca, PA-C 02/24/2023  Patient seen and examined, agree assessment and plan outlined above Still edematous- will continue Lasix  Viviann Spare C. Dorris Fetch, MD [facility] [phone]

## 2023-02-25 NOTE — Progress Notes (Signed)
Physical Therapy Treatment Patient Details Name: Jerry [name] MRN: [id number] DOB: [birth year] Today's Date: 02/25/2023   History of Present Illness 58 yo male admitted 02/07/23 with severe AS. S/p AVR on 1/3. S/p TEE and subxyphoid pericardial window and insertion of Left pleural chest tube on 02/20/23.  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    PT Comments   The pt was agreeable to session, reports mobilizing well earlier in the morning with staff. He continues to require min-modA to complete bed mobility with sternal precautions and light assistance from elevated bed for sit-stand without use of UE. Once ambulating, pt taking frequent rest breaks, but VSS and no overt LOB or buckling this session. He was challenged by short duration speed intervals with good difference in gait speed. Pt continues to present with slight confusion and need for increased cues and assist for problem solving and safety. Continue to recommend post-acute rehab due prior to return to home alone. Pt does not have the assistance needed to safely manage at home at this time.    If plan is discharge home, recommend the following: A little help with walking and/or transfers;A little help with bathing/dressing/bathroom;Assistance with cooking/housework;Direct supervision/assist for financial management;Assist for transportation;Supervision due to cognitive status   Can travel by private vehicle     Yes  Equipment Recommendations  Rolling walker (2 wheels);BSC/3in1    Recommendations for Other Services       Precautions / Restrictions Precautions Precautions: Sternal;Fall;Other (comment) Precaution Booklet Issued: No Precaution Comments: sternal precautions Restrictions Weight Bearing Restrictions Per Provider Order: No RUE Weight Bearing Per Provider Order: Non weight bearing LUE Weight Bearing Per Provider Order: Non weight bearing Other Position/Activity Restrictions: sternal  precautions     Mobility  Bed Mobility Overal bed mobility: Needs Assistance Bed Mobility: Supine to Sit, Sit to Supine Rolling: Min assist   Supine to sit: Min assist, HOB elevated   Sit to sidelying: Mod assist, HOB elevated General bed mobility comments: minA to roll to EOB and elevate trunk, modA to return LE to bed. pt holding heart pillow    Transfers Overall transfer level: Needs assistance Equipment used: None Transfers: Sit to/from Stand Sit to Stand: Min assist, From elevated surface           General transfer comment: verbal cues to hug pillow, minA for initial power up    Ambulation/Gait Ambulation/Gait assistance: Contact guard assist Gait Distance (Feet): 400 Feet Assistive device: Rolling walker (2 wheels) Gait Pattern/deviations: Step-through pattern, Decreased stride length Gait velocity: 0.25 m/s Gait velocity interpretation: <1.31 ft/sec, indicative of household ambulator   General Gait Details: pt with frequent standing rest breaks, worked on speed intervals to increase intensity     Balance Overall balance assessment: Needs assistance Sitting-balance support: No upper extremity supported, Feet supported Sitting balance-Leahy Scale: Good     Standing balance support: No upper extremity supported, During functional activity Standing balance-Leahy Scale: Fair Standing balance comment: can static stand without UE support with wide base of support but requires RW for safe ambulation                            Cognition Arousal: Alert Behavior During Therapy: WFL for tasks assessed/performed [facility]) Overall Cognitive Status: No family/caregiver present to determine baseline cognitive functioning Area of Impairment: Attention, Memory, Following commands, Safety/judgement, Awareness, Problem solving  Current Attention Level: Sustained Memory: Decreased short-term memory Following Commands: Follows one step  commands with increased time Safety/Judgement: Decreased awareness of safety, Decreased awareness of deficits Awareness: Emergent Problem Solving: Slow processing, Decreased initiation, Requires verbal cues General Comments: AAOx4 and pleasant throughout session; aware of previous TBI which has impaired cognition at baseline        Exercises      General Comments General comments (skin integrity, edema, etc.): VSS on RA, pt reports dizziness after session      Pertinent Vitals/Pain Pain Assessment Pain Assessment: Faces Faces Pain Scale: Hurts a little bit Pain Location: generalized, sore Pain Descriptors / Indicators: Sore Pain Intervention(s): Limited activity within patient's tolerance, Monitored during session, Repositioned     PT Goals (current goals can now be found in the care plan section) Acute Rehab PT Goals Patient Stated Goal: return home PT Goal Formulation: With patient Time For Goal Achievement: 03/07/23 Potential to Achieve Goals: Good Progress towards PT goals: Progressing toward goals    Frequency    Min 1X/week       AM-PAC PT "6 Clicks" Mobility   Outcome Measure  Help needed turning from your back to your side while in a flat bed without using bedrails?: A Little Help needed moving from lying on your back to sitting on the side of a flat bed without using bedrails?: A Little Help needed moving to and from a bed to a chair (including a wheelchair)?: A Little Help needed standing up from a chair using your arms (e.g., wheelchair or bedside chair)?: A Little Help needed to walk in hospital room?: A Little Help needed climbing 3-5 steps with a railing? : A Lot 6 Click Score: 17    End of Session Equipment Utilized During Treatment: Gait belt Activity Tolerance: Patient tolerated treatment well Patient left: in bed;with call bell/phone within reach;with bed alarm set Nurse Communication: Mobility status PT Visit Diagnosis: Other abnormalities  of gait and mobility (R26.89);Difficulty in walking, not elsewhere classified (R26.2)     Time: 4098-1191 PT Time Calculation (min) (ACUTE ONLY): 24 min  Charges:    $Gait Training: 8-22 mins $Therapeutic Exercise: 8-22 mins PT General Charges $$ ACUTE PT VISIT: 1 Visit                     Vickki Muff, PT, DPT   Acute Rehabilitation Department Office [phone] Secure Chat Communication Preferred    Ronnie Derby 02/25/2023, 3:10 PM

## 2023-02-25 NOTE — Progress Notes (Signed)
Patient refused standing weight. Bed weight got instead.

## 2023-02-25 NOTE — Progress Notes (Addendum)
      [address]       [place] [address]             [phone]     17 Days Post-Op Aortic Valve Replacement   5 Days Post-Op Procedure(s) (LRB): SUBXYPHOID PERICARDIAL WINDOW (N/A) TRANSESOPHAGEAL ECHOCARDIOGRAM (TEE) (N/A) Subjective:   Alert and oriented, continuing to feel stronger.  York Spaniel he feels like walking more today and feels hungry.   BM last evening.  Objective: Vital signs in last 24 hours: Temp:  [97.3 F (36.3 C)-98.3 F (36.8 C)] 97.8 F (36.6 C) (01/20 0730) Pulse Rate:  [64-78] 67 (01/20 0502) Cardiac Rhythm: Normal sinus rhythm (01/19 1916) Resp:  [15-21] 17 (01/20 0730) BP: (114-144)/(69-80) 144/78 (01/20 0730) SpO2:  [93 %-100 %] 93 % (01/20 0502) Weight:  [111.7 kg] 111.7 kg (01/20 0436)     Intake/Output from previous day: 01/19 0701 - 01/20 0700 In: 0  Out: 2800 [Urine:2800] Intake/Output this shift: No intake/output data recorded.  General appearance: alert, cooperative, and no distress Neurologic: no focal deficits Heart: stable SR past 24 hours  Lungs: normal work of breathing and clear breath sounds. Sats stable on RA. Abdomen: soft, no tenderness Extremities: trace LE edema Wound: the sternotomy incision is intact and dry.   Lab Results: Recent Labs    02/24/23 0331  WBC 12.6*  HGB 9.4*  HCT 30.4*  PLT 247   BMET:  Recent Labs    02/23/23 0340 02/24/23 0331  NA 136 137  K 4.6 4.1  CL 105 102  CO2 22 24  GLUCOSE 81 93  BUN 28* 23*  CREATININE 1.29* 1.19  CALCIUM 8.1* 8.5*    PT/INR: No results for input(s): "LABPROT", "INR" in the last 72 hours. ABG    Component Value Date/Time   PHART 7.261 (L) 02/20/2023 0922   HCO3 13.1 (L) 02/20/2023 0922   TCO2 14 (L) 02/20/2023 0922   ACIDBASEDEF 13.0 (H) 02/20/2023 0922   O2SAT 99 02/20/2023 0922   CBG (last 3)  Recent Labs    02/22/23 2206 02/23/23 0614 02/23/23 1157  GLUCAP 85 78 98    Assessment/Plan: S/P Procedure(s)  (LRB): SUBXYPHOID PERICARDIAL WINDOW (N/A) TRANSESOPHAGEAL ECHOCARDIOGRAM (TEE) (N/A)  -POD17 bioprosthetic AVR for severe bicuspid aortic stenosis -POD5 subxiphoid pericardial drainage of a large pericardial effusion and drainage of left pleural effusion.  Overall continues to improve and gain strength. On ASA, low-dose metoprolol, Crestor.  -Post-op atrial fibrillation- Stable SR past 24 hours, K+ 4.2 yesterday. Continue oral amiodarone loading at 400mg  BID but taper dose at discharge.  -History of head injury and Seizure disorder- Depakote and Dilantin resumed. Neuro status at baseline.   -Renal- mild renal insuffiencey post-op- creat has normalized post drainage of the pericardial effusion.  He is below pre-op Wt with only trace LE edema.  -Expected acute blood loss anemia- Hct trending up, continue Fe++ replacement.  -GI- bowel function returned, abd benign, tolerating PO's  -Disposition- living alone prior to surgery. PT /OT recommended short-term SNF prior to returning home and Mr. Kaz has agreed to this plan. He is medically stable to resume discharge planning for SNF.   LOS: 17 days    Leary Roca, PA-C 02/25/2023    Chart reviewed, patient examined, agree with above.  Hemodynamics have been stable and maintaining sinus rhythm. He feels well. I think he can go to SNF when bed available.

## 2023-02-25 NOTE — Progress Notes (Signed)
CARDIAC REHAB PHASE I   Pt has just returned from walk in hallway. Reports tolerating well with no dizziness, SOB or pain. States he felt little tired. Assisted pt with mobility in bed, for more comfortable position. Pt moving well maintaining proper sternal precautions. Pt about to order lunch. Encouraged increased po intake, mobility and IS use. Pt awaiting discharge to SNF. Will continue to follow.    [id number] Woodroe Chen, RN BSN 02/25/2023 11:21 AM

## 2023-02-25 NOTE — Plan of Care (Signed)
  Problem: Elimination: Goal: Will not experience complications related to bowel motility Outcome: Progressing   Problem: Skin Integrity: Goal: Risk for impaired skin integrity will decrease Outcome: Progressing   Problem: Activity: Goal: Risk for activity intolerance will decrease Outcome: Not Progressing

## 2023-02-25 NOTE — TOC Progression Note (Signed)
Transition of Care [facility]) - Progression Note    Patient Details  Name: Jerry [name] MRN: [id number] Date of Birth: 08-09-1965  Transition of Care [facility]) CM/SW Contact  Carley Hammed, LCSW Phone Number: 02/25/2023, 8:32 AM  Clinical Narrative:    Per MD note, pt is medically stable to continue DC planning. CSW spoke with [facility] rehab, and requested they start authorization. [place] to follow up when authorization is approved. TOC will continue to follow.    Expected Discharge Plan: Skilled Nursing Facility Barriers to Discharge: Continued Medical Work up  Expected Discharge Plan and Services In-house Referral: Clinical Social Work Discharge Planning Services: CM Consult Post Acute Care Choice: Home Health, Skilled Nursing Facility Living arrangements for the past 2 months: Single Family Home                 DME Arranged: 3-N-1, Walker rolling         HH Arranged: RN, PT, OT           Social Determinants of Health (SDOH) Interventions SDOH Screenings   Food Insecurity: No Food Insecurity (02/09/2023)  Housing: Low Risk  (02/09/2023)  Transportation Needs: No Transportation Needs (02/09/2023)  Utilities: Not At Risk (02/09/2023)  Depression (PHQ2-9): Low Risk  (05/11/2020)  Tobacco Use: Medium Risk (02/20/2023)    Readmission Risk Interventions     No data to display

## 2023-02-26 LAB — BASIC METABOLIC PANEL
Anion gap: 10 (ref 5–15)
BUN: 19 mg/dL (ref 6–20)
CO2: 24 mmol/L (ref 22–32)
Calcium: 9 mg/dL (ref 8.9–10.3)
Chloride: 105 mmol/L (ref 98–111)
Creatinine, Ser: 1.15 mg/dL (ref 0.61–1.24)
GFR, Estimated: >60 mL/min (ref >60)
Glucose, Bld: 86 mg/dL (ref 70–99)
Potassium: 4.4 mmol/L (ref 3.5–5.1)
Sodium: 139 mmol/L (ref 135–145)

## 2023-02-26 MED ORDER — LACTULOSE 10 GM/15ML PO SOLN
20.0000 g | Freq: Every day | ORAL | Status: DC | PRN
  Administered 2023-02-26 – 2023-03-05 (×4): 20 g via ORAL
  Filled 2023-02-26 (×6): qty 30

## 2023-02-26 NOTE — Progress Notes (Addendum)
[address]       [place] [address]             [phone]     18 Days Post-Op Aortic Valve Replacement   6 Days Post-Op Procedure(s) (LRB): SUBXYPHOID PERICARDIAL WINDOW (N/A) TRANSESOPHAGEAL ECHOCARDIOGRAM (TEE) (N/A) Subjective:   On the bedpan, say he feels he need to move his bowels but can't. Asked for a stronger laxative.  Otherwise no new concerns.    Objective: Vital signs in last 24 hours: Temp:  [97.9 F (36.6 C)-98.4 F (36.9 C)] 98.3 F (36.8 C) (01/21 0328) Pulse Rate:  [65-87] 65 (01/21 0328) Cardiac Rhythm: Normal sinus rhythm (01/20 1900) Resp:  [16-22] 20 (01/21 0556) BP: (116-141)/(65-80) 141/66 (01/21 0328) SpO2:  [92 %-98 %] 98 % (01/20 2316) Weight:  [111.7 kg] 111.7 kg (01/21 0556)     Intake/Output from previous day: 01/20 0701 - 01/21 0700 In: 960 [P.O.:960] Out: 2750 [Urine:2750] Intake/Output this shift: No intake/output data recorded.  General appearance: alert, cooperative, and no distress Neurologic: no focal deficits Heart: stable SR past 24 hours  Lungs: normal work of breathing and clear breath sounds. Sats stable on RA. Abdomen: soft, no tenderness Extremities: trace LE edema Wound: the sternotomy incision is intact and dry.   Lab Results: Recent Labs    02/24/23 0331  WBC 12.6*  HGB 9.4*  HCT 30.4*  PLT 247   BMET:  Recent Labs    02/24/23 0331 02/26/23 0316  NA 137 139  K 4.1 4.4  CL 102 105  CO2 24 24  GLUCOSE 93 86  BUN 23* 19  CREATININE 1.19 1.15  CALCIUM 8.5* 9.0    PT/INR: No results for input(s): "LABPROT", "INR" in the last 72 hours. ABG    Component Value Date/Time   PHART 7.261 (L) 02/20/2023 0922   HCO3 13.1 (L) 02/20/2023 0922   TCO2 14 (L) 02/20/2023 0922   ACIDBASEDEF 13.0 (H) 02/20/2023 0922   O2SAT 99 02/20/2023 0922   CBG (last 3)  Recent Labs    02/23/23 1157  GLUCAP 98    Assessment/Plan: S/P Procedure(s) (LRB): SUBXYPHOID PERICARDIAL WINDOW  (N/A) TRANSESOPHAGEAL ECHOCARDIOGRAM (TEE) (N/A)  -POD18 bioprosthetic AVR for severe bicuspid aortic stenosis -POD6 subxiphoid pericardial drainage of a large pericardial effusion and drainage of left pleural effusion.  Hemodynamically stable.  On ASA, low-dose metoprolol, Crestor.  -Post-op atrial fibrillation- Stable SR past 24 hours, K+ 4.4. Continue oral amiodarone loading at 400mg  BID but taper dose at discharge.  -History of head injury and Seizure disorder- Depakote and Dilantin resumed. Neuro status at baseline.   -Renal- mild renal insuffiencey post-op- creat has normalized post drainage of the pericardial effusion.  He is below pre-op Wt with only trace LE edema.  -Expected acute blood loss anemia- Hct trending up, continue Fe++ replacement.  -GI- tolerating PO's, laxative ordered per his request.   -Disposition- living alone prior to surgery. PT /OT recommended short-term SNF prior to returning home and Mr. Giroux has agreed to this plan. He is medically stable to resume discharge planning for SNF. The [facility] team has resumed efforts to secure a SNF bed.   LOS: 18 days    Leary Roca, PA-C 02/26/2023   Chart reviewed, patient examined, agree with above.  He is maintaining sinus rhythm for the most part. His baseline HR is high 50's to 60 in sinus so I don't think he will tolerate any further increase of Lopressor. Waiting  on SNF bed.

## 2023-02-26 NOTE — Progress Notes (Signed)
Occupational Therapy Treatment Patient Details Name: Jerry [name] MRN: [id number] DOB: 03/19/1965 Today's Date: 02/26/2023   History of present illness 58 yo male admitted 02/07/23 with severe AS. S/p AVR on 1/3. S/p TEE and subxyphoid pericardial window and insertion of Left pleural chest tube on 02/20/23.  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   OT comments  Pt making good progress towards OT goals. Pt received on bedpan though no BM as pt had hoped. Encouraged use of BSC vs ambulation to toilet in the future based on current functional abilities. Overall, pt able to manage bed mobility and hallway mobility using RW w/ no more than CGA. Pt requires Min A for ADL completion assessed today w/ further problem solving and education needed regarding ADLs with sternal precautions.       If plan is discharge home, recommend the following:  Assistance with cooking/housework;Direct supervision/assist for medications management;Direct supervision/assist for financial management;Assist for transportation;Help with stairs or ramp for entrance;Supervision due to cognitive status;A little help with bathing/dressing/bathroom   Equipment Recommendations  Other (comment) (RW)    Recommendations for Other Services      Precautions / Restrictions Precautions Precautions: Sternal;Fall;Other (comment) Restrictions Weight Bearing Restrictions Per Provider Order: No       Mobility Bed Mobility Overal bed mobility: Needs Assistance Bed Mobility: Rolling, Sidelying to Sit Rolling: Contact guard assist Sidelying to sit: Contact guard assist       General bed mobility comments: cues for sequencing and increased time/effort but pt able to kick LE off of bed and lift trunk holding to heart pillow    Transfers Overall transfer level: Needs assistance Equipment used: Rolling walker (2 wheels) Transfers: Sit to/from Stand Sit to Stand: Contact guard assist            General transfer comment: successful stand on second attempt holding to heart pillow. cued to scoot to EOB and use momentum to stand     Balance Overall balance assessment: Needs assistance Sitting-balance support: No upper extremity supported, Feet supported Sitting balance-Leahy Scale: Good     Standing balance support: No upper extremity supported, During functional activity Standing balance-Leahy Scale: Fair Standing balance comment: no AD for static standing but RW for confidence/energy conservation for hallway mobility                           ADL either performed or assessed with clinical judgement   ADL Overall ADL's : Needs assistance/impaired                 Upper Body Dressing : Minimal assistance;Sitting   Lower Body Dressing: Minimal assistance;Sitting/lateral leans;Sit to/from stand Lower Body Dressing Details (indicate cue type and reason): able to bring LE to self with increased effort in recliner             Functional mobility during ADLs: Supervision/safety;Rolling walker (2 wheels) General ADL Comments: Pt on bedpan on entry though no BM. Emphasis on sternal precautions with mobility and initial thoughts on ADL mgmt to simulate home routine. pt declined toilet transfer or ADLs standing at sink this AM    Extremity/Trunk Assessment Upper Extremity Assessment Upper Extremity Assessment: Generalized weakness;Right hand dominant   Lower Extremity Assessment Lower Extremity Assessment: Defer to PT evaluation        Vision   Vision Assessment?: No apparent visual deficits   Perception     Praxis      Cognition Arousal:  Alert Behavior During Therapy: WFL for tasks assessed/performed, Flat affect Overall Cognitive Status: History of cognitive impairments - at baseline Area of Impairment: Attention, Memory, Following commands, Safety/judgement, Awareness, Problem solving                   Current Attention Level: Sustained,  Selective Memory: Decreased short-term memory, Decreased recall of precautions Following Commands: Follows one step commands consistently, Follows one step commands inconsistently, Follows multi-step commands consistently, Follows multi-step commands with increased time Safety/Judgement: Decreased awareness of deficits Awareness: Emergent Problem Solving: Slow processing, Decreased initiation, Requires verbal cues General Comments: AAOx4 and pleasant throughout session; aware of previous TBI which has impaired cognition at baseline. shows some insight into sternal precautions with intermittent cues for problem solving tasks        Exercises      Shoulder Instructions       General Comments      Pertinent Vitals/ Pain       Pain Assessment Pain Assessment: Faces Faces Pain Scale: No hurt Pain Intervention(s): Monitored during session  Home Living                                          Prior Functioning/Environment              Frequency  Min 1X/week        Progress Toward Goals  OT Goals(current goals can now be found in the care plan section)  Progress towards OT goals: Progressing toward goals  Acute Rehab OT Goals Patient Stated Goal: have some breakfast, go to rehab OT Goal Formulation: With patient Time For Goal Achievement: 03/11/23 Potential to Achieve Goals: Good  Plan      Co-evaluation                 AM-PAC OT "6 Clicks" Daily Activity     Outcome Measure   Help from another person eating meals?: A Little Help from another person taking care of personal grooming?: A Little Help from another person toileting, which includes using toliet, bedpan, or urinal?: A Lot Help from another person bathing (including washing, rinsing, drying)?: A Lot Help from another person to put on and taking off regular upper body clothing?: A Little Help from another person to put on and taking off regular lower body clothing?: A Little 6  Click Score: 16    End of Session Equipment Utilized During Treatment: Gait belt;Rolling walker (2 wheels)  OT Visit Diagnosis: Unsteadiness on feet (R26.81);Other abnormalities of gait and mobility (R26.89);Muscle weakness (generalized) (M62.81);Other (comment)   Activity Tolerance Patient tolerated treatment well   Patient Left in chair;with call bell/phone within reach   Nurse Communication Mobility status        Time: 4098-1191 OT Time Calculation (min): 26 min  Charges: OT General Charges $OT Visit: 1 Visit OT Treatments $Self Care/Home Management : 8-22 mins $Therapeutic Activity: 8-22 mins  Bradd Canary, OTR/L Acute Rehab Services Office: [phone]   Lorre Munroe 02/26/2023, 8:38 AM

## 2023-02-26 NOTE — TOC Progression Note (Signed)
Transition of Care [facility]) - Progression Note    Patient Details  Name: Jerry [name] MRN: [id number] Date of Birth: 1965-09-10  Transition of Care [facility]) CM/SW Contact  Eduard Roux, [place] Phone Number: 02/26/2023, 2:53 PM  Clinical Narrative:     Received message from [facility] - reporting some concerns with authorization( [employer] [place])and the patient being in network with their facility. Revonda Standard, will keep TOC informed on the authorization for patient.   TOC will continue to follow and assist with discharge planning.  Antony Blackbird, MSW, LCSW Clinical Social Worker    Expected Discharge Plan: Skilled Nursing Facility Barriers to Discharge: Continued Medical Work up  Expected Discharge Plan and Services In-house Referral: Clinical Social Work Discharge Planning Services: CM Consult Post Acute Care Choice: Home Health, Skilled Nursing Facility Living arrangements for the past 2 months: Single Family Home                 DME Arranged: 3-N-1, Walker rolling         HH Arranged: RN, PT, OT           Social Determinants of Health (SDOH) Interventions SDOH Screenings   Food Insecurity: No Food Insecurity (02/09/2023)  Housing: Low Risk  (02/09/2023)  Transportation Needs: No Transportation Needs (02/09/2023)  Utilities: Not At Risk (02/09/2023)  Depression (PHQ2-9): Low Risk  (05/11/2020)  Tobacco Use: Medium Risk (02/20/2023)    Readmission Risk Interventions     No data to display

## 2023-02-26 NOTE — TOC Progression Note (Signed)
Transition of Care [facility]) - Progression Note    Patient Details  Name: CALLISTER KIESCHNICK MRN: [id number] Date of Birth: July 26, 1965  Transition of Care [facility]) CM/SW Contact  Eduard Roux, [place] Phone Number: 02/26/2023, 10:09 AM  Clinical Narrative:     Insurance still pending for [facility]  Antony Blackbird, MSW, LCSW Clinical Social Worker    Expected Discharge Plan: Skilled Nursing Facility Barriers to Discharge: Continued Medical Work up  Expected Discharge Plan and Services In-house Referral: Clinical Social Work Discharge Planning Services: CM Consult Post Acute Care Choice: Home Health, Skilled Nursing Facility Living arrangements for the past 2 months: Single Family Home                 DME Arranged: 3-N-1, Walker rolling         HH Arranged: RN, PT, OT           Social Determinants of Health (SDOH) Interventions SDOH Screenings   Food Insecurity: No Food Insecurity (02/09/2023)  Housing: Low Risk  (02/09/2023)  Transportation Needs: No Transportation Needs (02/09/2023)  Utilities: Not At Risk (02/09/2023)  Depression (PHQ2-9): Low Risk  (05/11/2020)  Tobacco Use: Medium Risk (02/20/2023)    Readmission Risk Interventions     No data to display

## 2023-02-27 NOTE — Progress Notes (Addendum)
Pt waiting for his lunch, would like to walk after eating. Will f/u.  F/U at 1338  CARDIAC REHAB PHASE I   PRE:  Rate/Rhythm: 73 NSR  BP:  Sitting: 123/65       SpO2: 96 RA  MODE:  Ambulation: 288 ft    POST:  Rate/Rhythm: 77 NSR  BP:  Sitting: 121/43      SpO2: 96 RA  Pt amb with CGA using gait belt with RW in the hallway, pt took x2 attempts to stand with min-mod assist from CGA and bed elevation assist. Pt took frequent breaks during ambulation d/t fatigue. Pt walked with slow and steady gait. Pt returned to bed with all needs met. CB in reach.  Jerry Congress  MS, ACSM-CEP 2:22 PM 02/27/2023

## 2023-02-27 NOTE — Progress Notes (Addendum)
      [address]       [place] [address]             [phone]     19 Days Post-Op Aortic Valve Replacement   7 Days Post-Op Procedure(s) (LRB): SUBXYPHOID PERICARDIAL WINDOW (N/A) TRANSESOPHAGEAL ECHOCARDIOGRAM (TEE) (N/A) Subjective:   Sitting up eating breakfast. Feeling better, walked once in the hall yesterday but said he is eager to do more therapy.  Awaiting Therapist, occupational for [facility].  Objective: Vital signs in last 24 hours: Temp:  [97.7 F (36.5 C)-98.3 F (36.8 C)] 98.3 F (36.8 C) (01/22 0334) Pulse Rate:  [60-72] 60 (01/22 0334) Cardiac Rhythm: Sinus bradycardia (01/22 0344) Resp:  [15-20] 20 (01/22 0334) BP: (115-150)/(67-101) 117/67 (01/22 0334) SpO2:  [93 %-98 %] 93 % (01/21 2015)     Intake/Output from previous day: 01/21 0701 - 01/22 0700 In: 960 [P.O.:960] Out: 1950 [Urine:1950] Intake/Output this shift: No intake/output data recorded.  General appearance: alert, cooperative, and no distress Neurologic: no focal deficits Heart: stable SR / SB past 24 hours  Lungs: normal work of breathing and clear breath sounds. Sats stable on RA. Abdomen: soft, no tenderness Extremities: SCD's to LEs Wound: the sternotomy incision is intact and dry.   Lab Results: No results for input(s): "WBC", "HGB", "HCT", "PLT" in the last 72 hours.  BMET:  Recent Labs    02/26/23 0316  NA 139  K 4.4  CL 105  CO2 24  GLUCOSE 86  BUN 19  CREATININE 1.15  CALCIUM 9.0    PT/INR: No results for input(s): "LABPROT", "INR" in the last 72 hours. ABG    Component Value Date/Time   PHART 7.261 (L) 02/20/2023 0922   HCO3 13.1 (L) 02/20/2023 0922   TCO2 14 (L) 02/20/2023 0922   ACIDBASEDEF 13.0 (H) 02/20/2023 0922   O2SAT 99 02/20/2023 0922   CBG (last 3)  No results for input(s): "GLUCAP" in the last 72 hours.   Assessment/Plan: S/P Procedure(s) (LRB): SUBXYPHOID PERICARDIAL WINDOW (N/A) TRANSESOPHAGEAL ECHOCARDIOGRAM (TEE)  (N/A)  -POD19 bioprosthetic AVR for severe bicuspid aortic stenosis -POD7 subxiphoid pericardial drainage of a large pericardial effusion and drainage of left pleural effusion.  Hemodynamically stable.  On ASA, low-dose metoprolol, Crestor.  -Post-op atrial fibrillation- Stable SR, K+ 4.4. Decrease the oral amiodarone loading to 200mg  BID.  -History of head injury and Seizure disorder- Depakote and Dilantin resumed. Neuro status at baseline.   -Renal- mild renal insuffiencey post-op- creat has normalized post drainage of the pericardial effusion.  He is below pre-op Wt with only trace LE edema.  -Expected acute blood loss anemia- Hct trending up, continue Fe++ replacement.  -GI- tolerating PO's, BM yesterday.  -Disposition- living alone prior to surgery. PT /OT recommended short-term SNF prior to returning home and Mr. Vessey has agreed to this plan. He is medically stable to resume discharge planning for SNF. Awaiting insurance approval for a bed offer at [facility].   LOS: 19 days    Leary Roca, PA-C 02/27/2023    Chart reviewed, patient examined, agree with above.  He looks good. Just waiting for insurance approval for SNF.

## 2023-02-28 LAB — BASIC METABOLIC PANEL
Anion gap: 10 (ref 5–15)
BUN: 15 mg/dL (ref 6–20)
CO2: 22 mmol/L (ref 22–32)
Calcium: 9.3 mg/dL (ref 8.9–10.3)
Chloride: 106 mmol/L (ref 98–111)
Creatinine, Ser: 1.17 mg/dL (ref 0.61–1.24)
GFR, Estimated: >60 mL/min (ref >60)
Glucose, Bld: 120 mg/dL — ABNORMAL HIGH (ref 70–99)
Potassium: 4.1 mmol/L (ref 3.5–5.1)
Sodium: 138 mmol/L (ref 135–145)

## 2023-02-28 LAB — MAGNESIUM: Magnesium: 2.1 mg/dL (ref 1.7–2.4)

## 2023-02-28 MED ORDER — AMIODARONE HCL 200 MG PO TABS
200.0000 mg | ORAL_TABLET | Freq: Two times a day (BID) | ORAL | Status: DC
  Administered 2023-02-28 – 2023-03-05 (×11): 200 mg via ORAL
  Filled 2023-02-28 (×11): qty 1

## 2023-02-28 NOTE — Progress Notes (Signed)
Physical Therapy Treatment Patient Details Name: Jerry [name] MRN: [id number] DOB: 1965/12/07 Today's Date: 02/28/2023   History of Present Illness 58 yo male admitted 02/07/23 with severe AS. S/p AVR on 1/3. S/p TEE and subxyphoid pericardial window and insertion of Left pleural chest tube on 02/20/23.  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    PT Comments  Patient progressing slowly.  Still needing help for bed mobility and noting unsafe with stand to sit so practiced with slower more controlled technique.  Patient needing reminder during function not to reach back to throw heart pillow on the bed and for keeping elbows in close.  Patient remains appropriate for inpatient rehab (<3 hours/day) at d/c.     If plan is discharge home, recommend the following: A little help with walking and/or transfers;A little help with bathing/dressing/bathroom;Assistance with cooking/housework;Direct supervision/assist for financial management;Assist for transportation;Supervision due to cognitive status   Can travel by private vehicle     Yes  Equipment Recommendations  Rolling walker (2 wheels);BSC/3in1    Recommendations for Other Services       Precautions / Restrictions Precautions Precautions: Fall;Sternal Precaution Comments: reviewed precautions during mobility Restrictions RUE Weight Bearing Per Provider Order: Non weight bearing LUE Weight Bearing Per Provider Order: Non weight bearing     Mobility  Bed Mobility Overal bed mobility: Needs Assistance Bed Mobility: Rolling, Sidelying to Sit, Sit to Supine Rolling: Supervision Sidelying to sit: Contact guard assist   Sit to supine: Min assist   General bed mobility comments: performed technique without cues, though difficulty lifting trunk needing A for trunk upright to prevent UE use; to supine pt encouraged to scoot up closer to top of bed prior to lying down then still slid down needing +2 A to scoot up     Transfers Overall transfer level: Needs assistance Equipment used: Rolling walker (2 wheels) Transfers: Sit to/from Stand Sit to Stand: Contact guard assist           General transfer comment: cues for momentum, needs bed up higher to stand, noted stand to sit with uncontrolled descent so practiced with slower controlled descent    Ambulation/Gait Ambulation/Gait assistance: Supervision Gait Distance (Feet): 200 Feet Assistive device: Rolling walker (2 wheels) Gait Pattern/deviations: Step-through pattern, Trunk flexed, Wide base of support, Decreased stride length       General Gait Details: slow pace, stopping to rest throughout   Stairs             Wheelchair Mobility     Tilt Bed    Modified Rankin (Stroke Patients Only)       Balance Overall balance assessment: Needs assistance Sitting-balance support: Feet supported Sitting balance-Leahy Scale: Good     Standing balance support: Bilateral upper extremity supported, No upper extremity supported Standing balance-Leahy Scale: Fair                              Cognition Arousal: Alert Behavior During Therapy: WFL for tasks assessed/performed, Flat affect Overall Cognitive Status: Within Functional Limits for tasks assessed                                 General Comments: cues for function with sternal precautions        Exercises      General Comments General comments (skin integrity, edema, etc.): SpO2 95%, BP  154/78 after ambulation      Pertinent Vitals/Pain Pain Assessment Pain Score: 2  Pain Location: chest Pain Descriptors / Indicators: Sore Pain Intervention(s): Monitored during session    Home Living                          Prior Function            PT Goals (current goals can now be found in the care plan section) Progress towards PT goals: Progressing toward goals    Frequency    Min 1X/week      PT Plan       Co-evaluation              AM-PAC PT "6 Clicks" Mobility   Outcome Measure  Help needed turning from your back to your side while in a flat bed without using bedrails?: None Help needed moving from lying on your back to sitting on the side of a flat bed without using bedrails?: A Little Help needed moving to and from a bed to a chair (including a wheelchair)?: A Little Help needed standing up from a chair using your arms (e.g., wheelchair or bedside chair)?: A Little Help needed to walk in hospital room?: A Little Help needed climbing 3-5 steps with a railing? : A Little 6 Click Score: 19    End of Session   Activity Tolerance: Patient tolerated treatment well Patient left: in bed;with call bell/phone within reach   PT Visit Diagnosis: Other abnormalities of gait and mobility (R26.89);Difficulty in walking, not elsewhere classified (R26.2)     Time: 4010-2725 PT Time Calculation (min) (ACUTE ONLY): 24 min  Charges:    $Gait Training: 8-22 mins $Therapeutic Activity: 8-22 mins PT General Charges $$ ACUTE PT VISIT: 1 Visit                     Sheran Lawless, PT Acute Rehabilitation Services Office:[phone] 02/28/2023    [name] 02/28/2023, 3:56 PM

## 2023-02-28 NOTE — Progress Notes (Signed)
Mobility Specialist Progress Note:    02/28/23 1221  Mobility  Activity Ambulated with assistance in hallway  Level of Assistance Minimal assist, patient does 75% or more  Assistive Device Front wheel walker  Distance Ambulated (ft) 200 ft  RUE Weight Bearing Per Provider Order NWB  LUE Weight Bearing Per Provider Order NWB  Activity Response Tolerated well  Mobility Referral Yes  Mobility visit 1 Mobility  Mobility Specialist Start Time (ACUTE ONLY) 1145  Mobility Specialist Stop Time (ACUTE ONLY) 1157  Mobility Specialist Time Calculation (min) (ACUTE ONLY) 12 min   Received pt in bed having no complaints and agreeable to mobility. Pt was asymptomatic throughout ambulation. Was able to maintain sternal precautions throughout session. Needed MinA w/ bed mobility to maintain sternal precautions. Returned to room w/o fault. Left in chair w/ call bell in reach and all needs met.   Thompson Grayer Mobility Specialist  Please contact vis Secure Chat or  Rehab Office [phone]

## 2023-02-28 NOTE — TOC Progression Note (Signed)
Transition of Care [facility]) - Progression Note    Patient Details  Name: Jerry [name] MRN: [id number] Date of Birth: 1965/12/23  Transition [facility]) CM/SW Contact  Eduard Roux, [place] Phone Number: 02/28/2023, 4:33 PM  Clinical Narrative:     Sent message to [facility] SNF  to determine status of insurance authorization- waiting on call back  Two different SNFs has had issues with the patient's insurance plan- [facility] was not able to get approval and it appears [facility] may not be in network with this particular plan as well. Patient may have to continue treatment until it is deemed, patient can safely d/c home.   TOC will continue to follow and assist with discharge planning.  Antony Blackbird, MSW, LCSW Clinical Social Worker    Expected Discharge Plan: Skilled Nursing Facility Barriers to Discharge: Continued Medical Work up  Expected Discharge Plan and Services In-house Referral: Clinical Social Work Discharge Planning Services: CM Consult Post Acute Care Choice: Home Health, Skilled Nursing Facility Living arrangements for the past 2 months: Single Family Home                 DME Arranged: 3-N-1, Walker rolling         HH Arranged: RN, PT, OT           Social Determinants of Health (SDOH) Interventions SDOH Screenings   Food Insecurity: No Food Insecurity (02/09/2023)  Housing: Low Risk  (02/09/2023)  Transportation Needs: No Transportation Needs (02/09/2023)  Utilities: Not At Risk (02/09/2023)  Depression (PHQ2-9): Low Risk  (05/11/2020)  Tobacco Use: Medium Risk (02/20/2023)    Readmission Risk Interventions     No data to display

## 2023-02-28 NOTE — Progress Notes (Addendum)
      [address]       [place] [address]             [phone]     20 Days Post-Op Aortic Valve Replacement   8 Days Post-Op Procedure(s) (LRB): SUBXYPHOID PERICARDIAL WINDOW (N/A) TRANSESOPHAGEAL ECHOCARDIOGRAM (TEE) (N/A) Subjective:   Awake and alert, eating breakfast. No new concerns.   Awaiting Therapist, occupational for [facility].  Objective: Vital signs in last 24 hours: Temp:  [97.6 F (36.4 C)-98.7 F (37.1 C)] 97.6 F (36.4 C) (01/23 0810) Pulse Rate:  [63-77] 76 (01/23 0810) Cardiac Rhythm: Normal sinus rhythm (01/22 1900) Resp:  [12-20] 20 (01/23 0810) BP: (109-123)/(55-81) 118/59 (01/23 0810) SpO2:  [94 %-100 %] 96 % (01/23 0810) Weight:  [98.8 kg] 98.8 kg (01/23 0316)     Intake/Output from previous day: 01/22 0701 - 01/23 0700 In: 720 [P.O.:720] Out: 1600 [Urine:1600] Intake/Output this shift: No intake/output data recorded.  General appearance: alert, cooperative, and no distress Neurologic: no focal deficits Heart: stable SR past 24 hours with no breadycardia  Lungs: normal work of breathing and clear breath sounds. Sats stable on RA. Abdomen: soft, no tenderness Extremities: no peripheral edema Wound: the sternotomy incision is intact and dry.   Lab Results: No results for input(s): "WBC", "HGB", "HCT", "PLT" in the last 72 hours.  BMET:  Recent Labs    02/26/23 0316  NA 139  K 4.4  CL 105  CO2 24  GLUCOSE 86  BUN 19  CREATININE 1.15  CALCIUM 9.0    PT/INR: No results for input(s): "LABPROT", "INR" in the last 72 hours. ABG    Component Value Date/Time   PHART 7.261 (L) 02/20/2023 0922   HCO3 13.1 (L) 02/20/2023 0922   TCO2 14 (L) 02/20/2023 0922   ACIDBASEDEF 13.0 (H) 02/20/2023 0922   O2SAT 99 02/20/2023 0922   CBG (last 3)  No results for input(s): "GLUCAP" in the last 72 hours.   Assessment/Plan: S/P Procedure(s) (LRB): SUBXYPHOID PERICARDIAL WINDOW (N/A) TRANSESOPHAGEAL ECHOCARDIOGRAM (TEE)  (N/A)  -POD230 bioprosthetic AVR for severe bicuspid aortic stenosis -POD8 subxiphoid pericardial drainage of a large pericardial effusion and drainage of left pleural effusion.  Hemodynamically stable.  On ASA, low-dose metoprolol, Crestor.  -Post-op atrial fibrillation- Stable SR.  Continue oral amiodarone at 200mg  BID.  -History of head injury and Seizure disorder- Depakote and Dilantin resumed. Neuro status at baseline.   -Renal- mild renal insuffiencey post-op- creat has normalized post drainage of the pericardial effusion.  He is below pre-op Wt with no LE edema.  -Expected acute blood loss anemia- Hct trending up, continue Fe++ replacement.  -GI- tolerating PO's and having appropriate bowel function.  -Disposition- living alone prior to surgery but he is not yet able to care for himself unassisted.  PT /OT recommended short-term SNF prior to returning home and Mr. Mcfaul has agreed to this plan. He is medically stable for discharge to  SNF. Awaiting insurance approval for a bed offer at [facility].   LOS: 20 days    Leary Roca, PA-C 02/28/2023  NSR, walked 200 ft today. Subxyphoid incision healing well. patient examined and medical record reviewed,agree with above note.Prob SNF tomorrow or Mon. Lovett Sox 02/28/2023

## 2023-03-01 DIAGNOSIS — R5381 Other malaise: Secondary | ICD-10-CM

## 2023-03-01 DIAGNOSIS — Z953 Presence of xenogenic heart valve: Secondary | ICD-10-CM | POA: Diagnosis not present

## 2023-03-01 LAB — MAGNESIUM: Magnesium: 2.1 mg/dL (ref 1.7–2.4)

## 2023-03-01 LAB — BASIC METABOLIC PANEL
Anion gap: 10 (ref 5–15)
BUN: 16 mg/dL (ref 6–20)
CO2: 22 mmol/L (ref 22–32)
Calcium: 9.2 mg/dL (ref 8.9–10.3)
Chloride: 108 mmol/L (ref 98–111)
Creatinine, Ser: 1.27 mg/dL — ABNORMAL HIGH (ref 0.61–1.24)
GFR, Estimated: >60 mL/min (ref >60)
Glucose, Bld: 94 mg/dL (ref 70–99)
Potassium: 4.2 mmol/L (ref 3.5–5.1)
Sodium: 140 mmol/L (ref 135–145)

## 2023-03-01 NOTE — Progress Notes (Signed)
Mobility Specialist Progress Note:    03/01/23 1153  Mobility  Activity Ambulated with assistance in hallway  Level of Assistance Minimal assist, patient does 75% or more  Assistive Device Front wheel walker  Distance Ambulated (ft) 300 ft  RUE Weight Bearing Per Provider Order NWB  LUE Weight Bearing Per Provider Order NWB  Activity Response Tolerated well  Mobility Referral Yes  Mobility visit 1 Mobility  Mobility Specialist Start Time (ACUTE ONLY) 0930  Mobility Specialist Stop Time (ACUTE ONLY) [id number]  Mobility Specialist Time Calculation (min) (ACUTE ONLY) 12 min   Received pt in bed having no complaints and agreeable to mobility. Pt required MinA w/ bed mobility to maintain sternal precautions. Took x2 standing rest breaks d/t fatigue, otherwise no c/o. Returned to room w/o fault. Left in bed w/ call bell in reach and all needs met.   Thompson Grayer Mobility Specialist  Please contact vis Secure Chat or  Rehab Office [phone]

## 2023-03-01 NOTE — Consult Note (Signed)
Physical Medicine and Rehabilitation Consult Reason for Consult: Evaluate appropriateness for Inpatient Rehab Referring Physician: Dr. Bill Salinas    HPI: Jerry [name] is a 58 y.o. male with PMHx of  has a past medical history of Aortic stenosis, Head injury, Heart murmur, Hypertension, Mixed hyperlipidemia, Seizure (HCC), Skin cancer, Varicose veins, and Vasculitis (HCC) (11/2014). . They were admitted to [facility] on 02/08/2023 for aortic valve replacement following outpatient cardiac workup revealing severe bicuspid aortic valve stenosis with cardiac catheterization showing no significant coronary disease.  He underwent bioprosthetic AV valve replacement on 1-3 with Dr. Laneta Simmers, developed postoperative atrial fibrillation and was started on amiodarone day of surgery with good rate control initially, developed SVT versus a flutter 1-13 with additional amiodarone bolus needed, EP consulted and suggested medication management with amiodarone and bisoprolol.  On 1-15, he underwent pericardial window due to echocardiogram showing large circumferential pericardial effusion.  Hospitalization was otherwise complicated by AKI due to postop diuresis, constipation, acute blood loss anemia, ongoing paroxysmal atrial fibrillation, and pre-existing seizure disorder.  PM&R was consulted to evaluate appropriateness for IPR admission.   Per therapy notes, patient is participating well in therapies, limited by sternal precautions with nonweightbearing bilateral upper extremities.  He is ambulating up to 200 feet with supervision and rolling walker, requiring contact-guard assist for transfers, and min assist for bed mobility.  He is min assist for ADLs with some truncal lean complicating sitting and pre-existing cognitive deficits from TBI.   Per patient, he has no 24/7 caregiver support at home but does have friends nearby who can check in periodically, lives alone in 1 story home with 1 STE through  porch, and was independent without AD prior to admission.  He works as a [employer] manager and is motivated to return home and to work as soon as possible.      Review of Systems  Constitutional:  Negative for chills and fever.  HENT:  Negative for hearing loss.   Eyes:  Negative for blurred vision and double vision.  Respiratory:  Negative for cough and shortness of breath.   Cardiovascular:  Negative for chest pain and palpitations.  Gastrointestinal:  Positive for constipation. Negative for abdominal pain, diarrhea, heartburn, nausea and vomiting.  Genitourinary:  Negative for frequency and urgency.  Musculoskeletal:  Negative for back pain, myalgias and neck pain.  Skin:  Negative for itching and rash.  Neurological:  Negative for tremors, sensory change, weakness and headaches.  Psychiatric/Behavioral:  Negative for depression, memory loss, substance abuse and suicidal ideas. The patient is nervous/anxious and has insomnia.    Past Medical History:  Diagnosis Date   Aortic stenosis    Head injury    Subdural hematoma after a traumatic brain injury in 1984   Heart murmur    Hypertension    Mixed hyperlipidemia    Seizure (HCC)    Skin cancer    Varicose veins    Vasculitis (HCC) 11/2014   Past Surgical History:  Procedure Laterality Date   AORTIC VALVE REPLACEMENT N/A 02/08/2023   Procedure: AORTIC VALVE REPLACEMENT (AVR) USING INSPIRIS AORTIC VALVE SIZE ;  Surgeon: Alleen Borne, MD;  Location: [facility] OR;  Service: Open Heart Surgery;  Laterality: N/A;   RIGHT/LEFT HEART CATH AND CORONARY ANGIOGRAPHY N/A 12/10/2022   Procedure: RIGHT/LEFT HEART CATH AND CORONARY ANGIOGRAPHY;  Surgeon: Tonny Bollman, MD;  Location: [facility] INVASIVE CV LAB;  Service: Cardiovascular;  Laterality: N/A;   SUBXYPHOID PERICARDIAL WINDOW N/A 02/20/2023  Procedure: SUBXYPHOID PERICARDIAL WINDOW;  Surgeon: Alleen Borne, MD;  Location: [facility] OR;  Service: Thoracic;  Laterality: N/A;   Surgical plate in head  [id number]    TEE WITHOUT CARDIOVERSION N/A 02/08/2023   Procedure: TRANSESOPHAGEAL ECHOCARDIOGRAM;  Surgeon: Alleen Borne, MD;  Location: [facility] OR;  Service: Open Heart Surgery;  Laterality: N/A;   TEE WITHOUT CARDIOVERSION N/A 02/20/2023   Procedure: TRANSESOPHAGEAL ECHOCARDIOGRAM (TEE);  Surgeon: Alleen Borne, MD;  Location: [facility] OR;  Service: Thoracic;  Laterality: N/A;   TOE INTERNAL FIXATION W/ COMPRESSION SCREW Right    Family History  Problem Relation Age of Onset   Diabetes Father    Social History:  reports that he quit smoking about 8 years ago. His smoking use included cigarettes. He has never used smokeless tobacco. He reports that he does not drink alcohol and does not use drugs. Allergies: No Known Allergies Medications Prior to Admission  Medication Sig Dispense Refill   amLODipine (NORVASC) 10 MG tablet Take 1 tablet (10 mg total) by mouth daily. (Patient taking differently: Take 10 mg by mouth in the morning.) 180 tablet 3   Cholecalciferol (VITAMIN D-3) 25 MCG (1000 UT) CAPS Take 1,000 Units by mouth in the morning.     divalproex (DEPAKOTE ER) 500 MG 24 hr tablet TAKE 3 TABLETS BY MOUTH IN THE MORNING AND 3 IN THE EVENING 540 tablet 3   phenytoin (DILANTIN) 100 MG ER capsule Take 2 capsules (200 mg total) by mouth at bedtime. 180 capsule 3   rosuvastatin (CRESTOR) 10 MG tablet Take 10 mg by mouth every evening.      Home: Home Living Family/patient expects to be discharged to:: Private residence Living Arrangements: Alone Available Help at Discharge: Friend(s), Other (Comment) (uncertain of accuracy of info, pt stated friend [name] and other friends can assist him) Type of Home: House Home Access: Level entry Home Layout: One level Bathroom Shower/Tub: Health visitor: Standard Home Equipment: None  Functional History: Prior Function Prior Level of Function : Independent/Modified Independent, Driving ADLs Comments: Ind Functional Status:  Mobility: Bed  Mobility Overal bed mobility: Needs Assistance Bed Mobility: Rolling, Sidelying to Sit, Sit to Supine Rolling: Supervision Sidelying to sit: Contact guard assist Supine to sit: Min assist, HOB elevated Sit to supine: Min assist Sit to sidelying: Mod assist, HOB elevated General bed mobility comments: performed technique without cues, though difficulty lifting trunk needing A for trunk upright to prevent UE use; to supine pt encouraged to scoot up closer to top of bed prior to lying down then still slid down needing +2 A to scoot up Transfers Overall transfer level: Needs assistance Equipment used: Rolling walker (2 wheels) Transfers: Sit to/from Stand Sit to Stand: Contact guard assist Bed to/from chair/wheelchair/BSC transfer type:: Step pivot Step pivot transfers: Min assist (with use of Eva walker) General transfer comment: cues for momentum, needs bed up higher to stand, noted stand to sit with uncontrolled descent so practiced with slower controlled descent Ambulation/Gait Ambulation/Gait assistance: Supervision Gait Distance (Feet): 200 Feet Assistive device: Rolling walker (2 wheels) Gait Pattern/deviations: Step-through pattern, Trunk flexed, Wide base of support, Decreased stride length General Gait Details: slow pace, stopping to rest throughout Gait velocity: 0.25 m/s Gait velocity interpretation: <1.31 ft/sec, indicative of household ambulator    ADL: ADL Overall ADL's : Needs assistance/impaired Eating/Feeding: Set up, Sitting Eating/Feeding Details (indicate cue type and reason): difficulty problem solving how to use straw in milk carton, set- up assist required Grooming: Set  up, Supervision/safety, Sitting Upper Body Bathing: Contact guard assist, Sitting Lower Body Bathing: Maximal assistance, Bed level Lower Body Bathing Details (indicate cue type and reason): simulated via LB dressing, MAX A to don socks from bed level Upper Body Dressing : Minimal assistance,  Sitting Upper Body Dressing Details (indicate cue type and reason): occasional cues for "staying in the tube" Lower Body Dressing: Minimal assistance, Sitting/lateral leans, Sit to/from stand Lower Body Dressing Details (indicate cue type and reason): able to bring LE to self with increased effort in recliner Toilet Transfer: Minimal assistance, Contact guard assist, Cueing for safety, Cueing for sequencing, Ambulation, BSC/3in1 (Eva walker; Min assist to power up while holding heart pillow; Largely CGA with occasiona Min assist needed to maintain balance during transfer) Toilet Transfer Details (indicate cue type and reason): simulated chair to bed Toileting- Clothing Manipulation and Hygiene: Maximal assistance, Sit to/from stand, Cueing for compensatory techniques (adhering to sternal precautions) Toileting - Clothing Manipulation Details (indicate cue type and reason): simulated Functional mobility during ADLs: Supervision/safety, Rolling walker (2 wheels) General ADL Comments: Pt on bedpan on entry though no BM. Emphasis on sternal precautions with mobility and initial thoughts on ADL mgmt to simulate home routine. pt declined toilet transfer or ADLs standing at sink this AM  Cognition: Cognition Overall Cognitive Status: Within Functional Limits for tasks assessed Orientation Level: Oriented X4 Cognition Arousal: Alert Behavior During Therapy: WFL for tasks assessed/performed, Flat affect Overall Cognitive Status: Within Functional Limits for tasks assessed Area of Impairment: Attention, Memory, Following commands, Safety/judgement, Awareness, Problem solving Orientation Level: Disoriented to, Time Current Attention Level: Sustained, Selective Memory: Decreased short-term memory, Decreased recall of precautions Following Commands: Follows one step commands consistently, Follows one step commands inconsistently, Follows multi-step commands consistently, Follows multi-step commands with  increased time Safety/Judgement: Decreased awareness of deficits Awareness: Emergent Problem Solving: Slow processing, Decreased initiation, Requires verbal cues General Comments: cues for function with sternal precautions  Blood pressure 126/72, pulse 72, temperature 97.6 F (36.4 C), temperature source Axillary, resp. rate 15, height 5\' 11"  (1.803 m), weight 94.2 kg, SpO2 97%.  Physical Exam Constitutional: No apparent distress. Appropriate appearance for age.  HENT: No JVD. Neck Supple. Trachea midline. Atraumatic; prior craniotomy well healed.  Eyes: PERRLA. EOMI. Visual fields grossly intact.  Cardiovascular: RRR, no murmurs/rub/gallops. No Edema. Peripheral pulses 2+  Respiratory: CTAB. No rales, rhonchi, or wheezing. On RA.  Abdomen: + bowel sounds, normoactive. No distention or tenderness.  GU: Not examined. +Purwick, draining dark urine.  Skin: C/D/I. No apparent lesions. Scare from prior craniotomy well healed. Sternal incision dermabonded, c/d/I, well approcximated + trochar surgical sites on L upper abdomen and bilateral lower abdomen; not completely approximated but without erythema or drainage. Chronic appearing skin discolorations on bilateral lower extremities MSK:      No apparent deformity. Full AROM.        Neurologic exam:  Cognition: AAO to person, place, time and event.  Language: Fluent, No substitutions or neoglisms. No dysarthria. Names 3/3 objects correctly.  Memory: Recalls 3/3 objects at 5 minutes. No apparent deficits  Insight: Good  insight into current condition.  Mood: Pleasant affect, appropriate mood.  Sensation: To light touch intact in BL UEs and LEs  Reflexes: 2+ in BL UE and LEs. Negative Hoffman's and babinski signs bilaterally.  CN: 2-12 grossly intact.  Coordination: No apparent tremors. No ataxia on FTN, HTS bilaterally.  Spasticity: MAS 0 in all extremities.       Strength:  RUE: antigravity and against resistance with  proximal testing limited by sternal precautions;  5/5 WE, 5/5 FF, 5/5 FA                LUE: antigravity and against resistance with proximal testing limited by sternal precautions;  5/5 WE, 5/5 FF, 5/5 FA                RLE: 5/5 HF, 5/5 KE, 5/5  DF, 5/5  EHL, 5/5  PF                 LLE:  5/5 HF, 5/5 KE, 5/5  DF, 5/5  EHL, 5/5  PF     Results for orders placed or performed during the hospital encounter of 02/08/23 (from the past 24 hours)  Basic metabolic panel     Status: Abnormal   Collection Time: 03/01/23  3:35 AM  Result Value Ref Range   Sodium 140 135 - 145 mmol/L   Potassium 4.2 3.5 - 5.1 mmol/L   Chloride 108 98 - 111 mmol/L   CO2 22 22 - 32 mmol/L   Glucose, Bld 94 70 - 99 mg/dL   BUN 16 6 - 20 mg/dL   Creatinine, Ser 2.13 (H) 0.61 - 1.24 mg/dL   Calcium 9.2 8.9 - 08.6 mg/dL   GFR, Estimated >57 >84 mL/min   Anion gap 10 5 - 15  Magnesium     Status: None   Collection Time: 03/01/23  3:35 AM  Result Value Ref Range   Magnesium 2.1 1.7 - 2.4 mg/dL   No results found.  Assessment/Plan: Diagnosis: Debility secondary to AV valve replacement c/b post-op effusion and paroxysmal A fib Does the need for close, 24 hr/day medical supervision in concert with the patient's rehab needs make it unreasonable for this patient to be served in a less intensive setting? Yes Co-Morbidities requiring supervision/potential complications: AKI c/b postop diuresis, constipation, acute blood loss anemia, ongoing paroxysmal atrial fibrillation, and constipation Due to bladder management, bowel management, safety, skin/wound care, disease management, medication administration, pain management, and patient education, does the patient require 24 hr/day rehab nursing? Yes Does the patient require coordinated care of a physician, rehab nurse, therapy disciplines of PT, OT to address physical and functional deficits in the context of the above medical diagnosis(es)? Yes Addressing deficits in the  following areas: balance, endurance, locomotion, strength, transferring, bathing, dressing, feeding, grooming, and toileting Can the patient actively participate in an intensive therapy program of at least 3 hrs of therapy per day at least 5 days per week? Yes The potential for patient to make measurable gains while on inpatient rehab is excellent Anticipated functional outcomes upon discharge from inpatient rehab are modified independent  with PT, modified independent with OT Estimated rehab length of stay to reach the above functional goals is: 5-7 days Anticipated discharge destination: Home Overall Rehab/Functional Prognosis: excellent  POST ACUTE RECOMMENDATIONS: This patient's condition is appropriate for continued rehabilitative care in the following setting: CIR Patient has agreed to participate in recommended program. Yes Note that insurance prior authorization may be required for reimbursement for recommended care.  Comment: Given ongoing medical need for cardiac monitoring and management postop with paroxysmal atrial fibrillation, as well as AKI and acute blood loss anemia, patient meets medical complexity for inpatient rehab stay. In addition, he is motivated and making gains with therapies but has some limitations due to sternal precautions, post-op weakness, and some carry over deficits due to old TBI. He is  independent at baseline and has excellent potential to return home at Mod I level with a short rehab stay for strengthening and reinforcement of precautions.    MEDICAL RECOMMENDATIONS: none   I have personally performed a face to face diagnostic evaluation of this patient. Additionally, I have examined the patient's medical record including any pertinent labs and radiographic images. If the physician assistant has documented in this note, I have reviewed and edited or otherwise concur with the physician assistant's documentation.  Thanks,  Angelina Sheriff, DO 03/01/2023

## 2023-03-01 NOTE — Progress Notes (Addendum)
      [address]       [place] [address]             [phone]      9 Days Post-Op Procedure(s) (LRB): SUBXYPHOID PERICARDIAL WINDOW (N/A) TRANSESOPHAGEAL ECHOCARDIOGRAM (TEE) (N/A) Subjective: The patient has no complaints this AM, had a BM this AM.   Objective: Vital signs in last 24 hours: Temp:  [97.5 F (36.4 C)-98.4 F (36.9 C)] 97.6 F (36.4 C) (01/24 0730) Pulse Rate:  [68-76] 72 (01/24 0730) Cardiac Rhythm: Normal sinus rhythm (01/23 1900) Resp:  [17-20] 17 (01/24 0730) BP: (104-133)/(59-74) 130/74 (01/24 0730) SpO2:  [92 %-98 %] 96 % (01/24 0730) Weight:  [94.2 kg] 94.2 kg (01/24 0524)  Hemodynamic parameters for last 24 hours:    Intake/Output from previous day: 01/23 0701 - 01/24 0700 In: 480 [P.O.:480] Out: 1150 [Urine:1150] Intake/Output this shift: No intake/output data recorded.  General appearance: alert, cooperative, and no distress Neurologic: intact Heart: regular rate and rhythm, S1, S2 normal, no murmur, click, rub or gallop Lungs: clear to auscultation bilaterally Abdomen: soft, non-tender; bowel sounds normal; no masses,  no organomegaly Extremities: extremities normal, atraumatic, no cyanosis or edema Wound: Clean and dry without sign of infection  Lab Results: No results for input(s): "WBC", "HGB", "HCT", "PLT" in the last 72 hours. BMET:  Recent Labs    02/28/23 0953 03/01/23 0335  NA 138 140  K 4.1 4.2  CL 106 108  CO2 22 22  GLUCOSE 120* 94  BUN 15 16  CREATININE 1.17 1.27*  CALCIUM 9.3 9.2    PT/INR: No results for input(s): "LABPROT", "INR" in the last 72 hours. ABG    Component Value Date/Time   PHART 7.261 (L) 02/20/2023 0922   HCO3 13.1 (L) 02/20/2023 0922   TCO2 14 (L) 02/20/2023 0922   ACIDBASEDEF 13.0 (H) 02/20/2023 0922   O2SAT 99 02/20/2023 0922   CBG (last 3)  No results for input(s): "GLUCAP" in the last 72 hours.  Assessment/Plan: S/P Procedure(s) (LRB): SUBXYPHOID PERICARDIAL  WINDOW (N/A) TRANSESOPHAGEAL ECHOCARDIOGRAM (TEE) (N/A)  Neuro: Hx of head injury and seizure disorder. Continue depakote and dilantin.   CV: Postop atrial fibrillation. NSR, HR 70s this AM. On PO Amiodarone 200mg  BID. BP controlled on Lopressor 12.5mg  BID.   Pulm: Saturating well on RA. Encourage IS and ambulation.   GI: +BM this AM, tolerating a diet  Renal: UO 1150cc/24hrs. Under preop weight. Cr 1.27. Not on diuresis. Monitor.   Expected postop ABLA: Trending up, continue iron supplement  Deconditioning: Continue work with PT/OT. Recommending SNF at discharge.   Dispo: Lives alone, medically stable for SNF but it looks like they are having trouble with insurance authorization. May have to continue hospital care until patient can d/c home with home health.    LOS: 21 days    Jerry Reichmann, PA-C 03/01/2023  Agree with above Dispo planning  Jerry [name]

## 2023-03-02 LAB — BASIC METABOLIC PANEL
Anion gap: 8 (ref 5–15)
BUN: 17 mg/dL (ref 6–20)
CO2: 22 mmol/L (ref 22–32)
Calcium: 9 mg/dL (ref 8.9–10.3)
Chloride: 109 mmol/L (ref 98–111)
Creatinine, Ser: 1.34 mg/dL — ABNORMAL HIGH (ref 0.61–1.24)
GFR, Estimated: >60 mL/min (ref >60)
Glucose, Bld: 90 mg/dL (ref 70–99)
Potassium: 4.6 mmol/L (ref 3.5–5.1)
Sodium: 139 mmol/L (ref 135–145)

## 2023-03-02 LAB — MAGNESIUM: Magnesium: 2.1 mg/dL (ref 1.7–2.4)

## 2023-03-02 NOTE — Progress Notes (Signed)
Inpatient Rehab Admissions Coordinator:    I met with pt. To discuss potential CIR admit. He is interested, as he states he has no help at home and needs to be completely independent. I explained that I am unlikely to have a bed for him until sometime next week and that he may progress to mod I in that time frame. He would still like to plan for CIR with home as a back up. I will send case to insurance Monday  Megan Salon, MS, CCC-SLP Rehab Admissions Coordinator  [phone] (celll) [phone] (office)

## 2023-03-02 NOTE — Progress Notes (Addendum)
      [address]       [place] [address]             [phone]      10 Days Post-Op Procedure(s) (LRB): SUBXYPHOID PERICARDIAL WINDOW (N/A) TRANSESOPHAGEAL ECHOCARDIOGRAM (TEE) (N/A)  Subjective:  Patient doing well.  He has no new complaints.  Does mention that overnight nursing staff seemed irritated and angry towards him.  Objective: Vital signs in last 24 hours: Temp:  [97.6 F (36.4 C)-98.4 F (36.9 C)] 97.8 F (36.6 C) (01/25 0342) Pulse Rate:  [69-100] 72 (01/25 0342) Cardiac Rhythm: Normal sinus rhythm (01/24 1900) Resp:  [15-22] 18 (01/25 0342) BP: (122-133)/(68-86) 133/77 (01/25 0342) SpO2:  [94 %-98 %] 98 % (01/25 0342) Weight:  [94.7 kg] 94.7 kg (01/25 0342)  Intake/Output from previous day: 01/24 0701 - 01/25 0700 In: 730 [P.O.:730] Out: 1350 [Urine:1350]  General appearance: alert, cooperative, and no distress Heart: regular rate and rhythm Lungs: clear to auscultation bilaterally Abdomen: soft, mildly tender to palpation, good BS Extremities: extremities normal, atraumatic, no cyanosis or edema Wound: healing nicely without evidence of infection  Lab Results: No results for input(s): "WBC", "HGB", "HCT", "PLT" in the last 72 hours. BMET:  Recent Labs    03/01/23 0335 03/02/23 0302  NA 140 139  K 4.2 4.6  CL 108 109  CO2 22 22  GLUCOSE 94 90  BUN 16 17  CREATININE 1.27* 1.34*  CALCIUM 9.2 9.0    PT/INR: No results for input(s): "LABPROT", "INR" in the last 72 hours. ABG    Component Value Date/Time   PHART 7.261 (L) 02/20/2023 0922   HCO3 13.1 (L) 02/20/2023 0922   TCO2 14 (L) 02/20/2023 0922   ACIDBASEDEF 13.0 (H) 02/20/2023 0922   O2SAT 99 02/20/2023 0922   CBG (last 3)  No results for input(s): "GLUCAP" in the last 72 hours.  Assessment/Plan: S/P Procedure(s) (LRB): SUBXYPHOID PERICARDIAL WINDOW (N/A) TRANSESOPHAGEAL ECHOCARDIOGRAM (TEE) (N/A)  CV- Post op A. Fib, maintaining NSR- continue Amiodarone 200  mg BID, Lopressor  Pulm- no acute issues, off oxygen, continue use of IS Renal- creatinine relatively stable, weight is below admission, monitor GI- mild constipation at times, continue prn stool agents Neuro- H/O of TBI... he is at baseline neuro status Deconditioning- patient lives alone w/o assistance for discharge, SNF placement has been difficult due to insurance not being accepted.  We have reached out to inpatient rehab as patient is actively participating with PT and making progress.  He would greatly benefit from more intensive rehabilitation so that he is safe for return home Dispo- patient doing well, continue PT/OT.Marland Kitchen monitor creatinine level.. can hopefully get to inpatient rehab early next week   LOS: 22 days    Lowella Dandy, PA-C 03/02/2023  Agree with above Ready for discharge Dispo planning  Pebble Botkin O Mckinlee Dunk

## 2023-03-02 NOTE — Plan of Care (Signed)
  Problem: Activity: Goal: Risk for activity intolerance will decrease Outcome: Progressing   Problem: Nutrition: Goal: Adequate nutrition will be maintained Outcome: Progressing   Problem: Elimination: Goal: Will not experience complications related to bowel motility Outcome: Progressing   Problem: Pain Management: Goal: General experience of comfort will improve Outcome: Progressing

## 2023-03-02 NOTE — Progress Notes (Signed)
Mobility Specialist Progress Note:    03/02/23 1551  Mobility  Activity Ambulated with assistance in hallway  Level of Assistance Minimal assist, patient does 75% or more  Assistive Device Front wheel walker  Distance Ambulated (ft) 300 ft  RUE Weight Bearing Per Provider Order NWB  LUE Weight Bearing Per Provider Order NWB  Activity Response Tolerated well  Mobility Referral Yes  Mobility visit 1 Mobility  Mobility Specialist Start Time (ACUTE ONLY) 1425  Mobility Specialist Stop Time (ACUTE ONLY) 1440  Mobility Specialist Time Calculation (min) (ACUTE ONLY) 15 min   Received pt ambulating w/ NT having no complaints MS offered to help finish ambulation. Pt was asymptomatic throughout ambulation and returned to room w/o fault. Left supine in bed w/ call bell in reach and all needs met.  Thompson Grayer Mobility Specialist  Please contact vis Secure Chat or  Rehab Office [phone]

## 2023-03-02 NOTE — Progress Notes (Signed)
CARDIAC REHAB PHASE I   Pt resting in bed, stated he is very tired. Declined walk stating he has been ambulating in hallway with RW, encouraged continued ambulation. Is eager to get to inpatient rehab and home. No questions regarding education done previously. [id number]    Jonna Coup, MS, ACSM-CEP 03/02/2023 8:37 AM

## 2023-03-03 LAB — MAGNESIUM: Magnesium: 2.1 mg/dL (ref 1.7–2.4)

## 2023-03-03 LAB — BASIC METABOLIC PANEL
Anion gap: 11 (ref 5–15)
BUN: 17 mg/dL (ref 6–20)
CO2: 21 mmol/L — ABNORMAL LOW (ref 22–32)
Calcium: 9.4 mg/dL (ref 8.9–10.3)
Chloride: 109 mmol/L (ref 98–111)
Creatinine, Ser: 1.18 mg/dL (ref 0.61–1.24)
GFR, Estimated: >60 mL/min (ref >60)
Glucose, Bld: 142 mg/dL — ABNORMAL HIGH (ref 70–99)
Potassium: 4.2 mmol/L (ref 3.5–5.1)
Sodium: 141 mmol/L (ref 135–145)

## 2023-03-03 LAB — ECHO INTRAOPERATIVE TEE
AR max vel: 2.19 cm2
AV Area VTI: 1.92 cm2
AV Area mean vel: 2.02 cm2
AV Mean grad: 28.3 mm[Hg]
AV Peak grad: 44.2 mm[Hg]
Ao pk vel: 3.32 m/s
Area-P 1/2: 2.22 cm2
MV VTI: 4.74 cm2

## 2023-03-03 NOTE — Progress Notes (Addendum)
      [address]       [place] [address]             [phone]      11 Days Post-Op Procedure(s) (LRB): SUBXYPHOID PERICARDIAL WINDOW (N/A) TRANSESOPHAGEAL ECHOCARDIOGRAM (TEE) (N/A)  Subjective:  Patient complaining of back pain.  Wants to get up and move around  Objective: Vital signs in last 24 hours: Temp:  [97.4 F (36.3 C)-98.1 F (36.7 C)] 98.1 F (36.7 C) (01/26 0853) Pulse Rate:  [69-75] 75 (01/26 0853) Cardiac Rhythm: Normal sinus rhythm (01/25 1900) Resp:  [16-20] 18 (01/26 0853) BP: (118-141)/(58-111) 130/58 (01/26 0853) SpO2:  [95 %-100 %] 97 % (01/26 0853) Weight:  [94.4 kg] 94.4 kg (01/26 0339)  Intake/Output from previous day: 01/25 0701 - 01/26 0700 In: 1210 [P.O.:1210] Out: 600 [Urine:600]  General appearance: alert, cooperative, and no distress Heart: regular rate and rhythm Lungs: clear to auscultation bilaterally Abdomen: soft, non-tender; bowel sounds normal; no masses,  no organomegaly Extremities: extremities normal, atraumatic, no cyanosis or edema Wound: clean and dry  Lab Results: No results for input(s): "WBC", "HGB", "HCT", "PLT" in the last 72 hours. BMET:  Recent Labs    03/01/23 0335 03/02/23 0302  NA 140 139  K 4.2 4.6  CL 108 109  CO2 22 22  GLUCOSE 94 90  BUN 16 17  CREATININE 1.27* 1.34*  CALCIUM 9.2 9.0    PT/INR: No results for input(s): "LABPROT", "INR" in the last 72 hours. ABG    Component Value Date/Time   PHART 7.261 (L) 02/20/2023 0922   HCO3 13.1 (L) 02/20/2023 0922   TCO2 14 (L) 02/20/2023 0922   ACIDBASEDEF 13.0 (H) 02/20/2023 0922   O2SAT 99 02/20/2023 0922   CBG (last 3)  No results for input(s): "GLUCAP" in the last 72 hours.  Assessment/Plan: S/P Procedure(s) (LRB): SUBXYPHOID PERICARDIAL WINDOW (N/A) TRANSESOPHAGEAL ECHOCARDIOGRAM (TEE) (N/A)  CV- Post operative A. Fib, maintaining NSR- remains on Amiodarone, Lopressor Pulm- no acute issues, off oxygen, continue  IS Renal- labs pending... have been drawn.. no indication for lasix at this time Deconditioning- continue PT/OT.. CIR consult has been placed, will start authorization tomorrow.. can hopefully get bed offer this week Dispo- patient stable, doing well.. continue aggressive PT/OT can hopefully get to CIR this week pending bed availability and insurance authorization   LOS: 23 days   Lowella Dandy, PA-C 03/03/2023  Agree with above Dispo planning  Cadance Raus Keane Scrape

## 2023-03-04 LAB — BASIC METABOLIC PANEL
Anion gap: 11 (ref 5–15)
BUN: 17 mg/dL (ref 6–20)
CO2: 21 mmol/L — ABNORMAL LOW (ref 22–32)
Calcium: 9 mg/dL (ref 8.9–10.3)
Chloride: 107 mmol/L (ref 98–111)
Creatinine, Ser: 1.2 mg/dL (ref 0.61–1.24)
GFR, Estimated: >60 mL/min (ref >60)
Glucose, Bld: 90 mg/dL (ref 70–99)
Potassium: 4.2 mmol/L (ref 3.5–5.1)
Sodium: 139 mmol/L (ref 135–145)

## 2023-03-04 LAB — MAGNESIUM: Magnesium: 2 mg/dL (ref 1.7–2.4)

## 2023-03-04 MED ORDER — SENNOSIDES-DOCUSATE SODIUM 8.6-50 MG PO TABS
1.0000 | ORAL_TABLET | Freq: Every day | ORAL | Status: DC
  Administered 2023-03-05: 1 via ORAL
  Filled 2023-03-04: qty 1

## 2023-03-04 NOTE — Progress Notes (Signed)
Physical Therapy Treatment Patient Details Name: Jerry [name] MRN: [id number] DOB: 19-Sep-1965 Today's Date: 03/04/2023   History of Present Illness 58 yo male admitted 02/07/23 with severe AS. S/p AVR on 1/3. S/p TEE and subxyphoid pericardial window and insertion of Left pleural chest tube on 02/20/23.  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    PT Comments  Pt continue to progress towards goals. Is progressing with gait distance with RW at CGA level but when RW removed pt becomes very unsteady, needs mod A, and gait speed becomes so slow as to not be functional. Needs a lot more work to return to [facility] and be able to return to work. Patient will benefit from intensive inpatient follow up therapy, >3 hours/day. PT will continue to follow.     If plan is discharge home, recommend the following: A little help with walking and/or transfers;A little help with bathing/dressing/bathroom;Assistance with cooking/housework;Direct supervision/assist for financial management;Assist for transportation;Supervision due to cognitive status   Can travel by private vehicle     Yes  Equipment Recommendations  Rolling walker (2 wheels);BSC/3in1    Recommendations for Other Services OT consult     Precautions / Restrictions Precautions Precautions: Fall;Sternal Precaution Booklet Issued: No Precaution Comments: reviewed precautions during mobility, pt able to verbalize Restrictions Weight Bearing Restrictions Per Provider Order: Yes RUE Weight Bearing Per Provider Order: Non weight bearing LUE Weight Bearing Per Provider Order: Non weight bearing Other Position/Activity Restrictions: sternal precautions     Mobility  Bed Mobility               General bed mobility comments: pt in recliner    Transfers Overall transfer level: Needs assistance Equipment used: Rolling walker (2 wheels) Transfers: Sit to/from Stand Sit to Stand: Contact guard assist            General transfer comment: with hands holding heart pillow. Continuing to work on controlled descent    Ambulation/Gait Ambulation/Gait assistance: Mod assist, Contact guard assist Gait Distance (Feet): 300 Feet Assistive device: Rolling walker (2 wheels), None Gait Pattern/deviations: Step-through pattern, Trunk flexed, Wide base of support, Decreased stride length Gait velocity: 0.25 m/s Gait velocity interpretation: <1.31 ft/sec, indicative of household ambulator   General Gait Details: pt able to walk with RW with CGA, L foot abducted, pt reports he thinks he has ambulated like this for years. Pt ambulated 15' without AD and gait becomes very unsteady, needs mod A for safety. Step length decreases to inches. Pt needs to be able to ambulate without RW to return to work, big change from his baseline   Comptroller Bed    Modified Rankin (Stroke Patients Only)       Balance Overall balance assessment: Needs assistance Sitting-balance support: Feet supported Sitting balance-Leahy Scale: Good     Standing balance support: Reliant on assistive device for balance Standing balance-Leahy Scale: Fair Standing balance comment: no AD for static standing but RW for confidence/energy conservation for hallway mobility               High Level Balance Comments: worked on standing with eyes closed, needed min to mod A as well as SL stance            Cognition Arousal: Alert Behavior During Therapy: WFL for tasks assessed/performed, Flat affect Overall Cognitive Status: Within Functional Limits for tasks assessed  General Comments: pt able to verbalize sternal precautions and demo how to keep them with function        Exercises      General Comments General comments (skin integrity, edema, etc.): VSS on RA, HR 86 bpm      Pertinent Vitals/Pain Pain Assessment Pain Assessment:  No/denies pain    Home Living                          Prior Function            PT Goals (current goals can now be found in the care plan section) Acute Rehab PT Goals Patient Stated Goal: return home PT Goal Formulation: With patient Time For Goal Achievement: 03/07/23 Potential to Achieve Goals: Good Progress towards PT goals: Progressing toward goals    Frequency    Min 1X/week      PT Plan      [name]-evaluation              AM-PAC PT "6 Clicks" Mobility   Outcome Measure  Help needed turning from your back to your side while in a flat bed without using bedrails?: None Help needed moving from lying on your back to sitting on the side of a flat bed without using bedrails?: A Little Help needed moving to and from a bed to a chair (including a wheelchair)?: A Little Help needed standing up from a chair using your arms (e.g., wheelchair or bedside chair)?: A Little Help needed to walk in hospital room?: A Little Help needed climbing 3-5 steps with a railing? : A Little 6 Click Score: 19    End of Session Equipment Utilized During Treatment: Gait belt Activity Tolerance: Patient tolerated treatment well Patient left: with call bell/phone within reach;in chair;with chair alarm set Nurse Communication: Mobility status PT Visit Diagnosis: Other abnormalities of gait and mobility (R26.89);Difficulty in walking, not elsewhere classified (R26.2)     Time: 1610-9604 PT Time Calculation (min) (ACUTE ONLY): 30 min  Charges:    $Gait Training: 23-37 mins PT General Charges $$ ACUTE PT VISIT: 1 Visit                     Jerry [name], PT  Acute Rehab Services Secure chat preferred Office [phone]    Jerry [name] Jerry [name] 03/04/2023, 9:39 AM

## 2023-03-04 NOTE — PMR Pre-admission (Signed)
PMR Admission Coordinator Pre-Admission Assessment  Patient: Jerry [name] is an 58 y.o., male MRN: [id number] DOB: 12-Apr-1965 Height: 5\' 11"  (180.3 cm) Weight: 97 kg  Insurance Information HMO:     PPO: yes     PCP:      IPA:      80/20:      OTHER:  PRIMARY: [employer] [place]      Policy#: [id number]      Subscriber: Pt CM Name: TBD      Phone#: TBD     Fax#: [phone] Pre-Cert#: [id number] Received insurance authorization on 03/05/23. Pt approved for 10 days beginning 03/05/23-03/14/23     Employer:  Benefits:  Phone #: [phone]     Name:  Dolores Hoose Date: 02/06/2023-02/04/9998 Deductible: $1,000 ($1,000 met) OOP Max: $4,000 ($2,048.19 met) CIR: 90% coverage, 10% co-insurance SNF: 90% coverage, 10% co-insurance Outpatient: 90% coverage, 10% co-insurance Home Health: 90% coverage, 10% co-insurance DME: 90% coverage, 10% co-insurance Providers: in network SECONDARY:       Policy#:      Phone#:    Artist:       Phone#:   The Data processing manager" for patients in Inpatient Rehabilitation Facilities with attached "Privacy Act Statement-Health Care Records" was provided and verbally reviewed with: Patient  Emergency Contact Information Contact Information     Name Relation Home Work Mobile   Garrison Friend [phone]  [phone]   Buel Ream   [phone]      Other Contacts   None on File     Current Medical History  Patient Admitting Diagnosis: Aortic Stenosis History of Present Illness: Jerry [name] is a 58 y.o. male with PMHx of  has a past medical history of Aortic stenosis, Head injury, Heart murmur, Hypertension, Mixed hyperlipidemia, Seizure (HCC), Skin cancer, Varicose veins, and Vasculitis (HCC) (11/2014). . They were admitted to [facility] on 02/08/2023 for aortic valve replacement following outpatient cardiac workup revealing severe bicuspid aortic valve stenosis with cardiac catheterization showing no  significant coronary disease.  He underwent bioprosthetic AV valve replacement on 1-3 with Dr. Laneta Simmers, developed postoperative atrial fibrillation and was started on amiodarone day of surgery with good rate control initially, developed SVT versus a flutter 1-13 with additional amiodarone bolus needed, EP consulted and suggested medication management with amiodarone and bisoprolol.  On 1-15, he underwent pericardial window due to echocardiogram showing large circumferential pericardial effusion.  Hospitalization was otherwise complicated by AKI due to postop diuresis, constipation, acute blood loss anemia, ongoing paroxysmal atrial fibrillation, and pre-existing seizure disorder.  Pt. Was seen by PT/OT and they recommended CIR to assist return to PLOF.    Patient's medical record from [facility] has been reviewed by the rehabilitation admission coordinator and physician.  Past Medical History  Past Medical History:  Diagnosis Date   Aortic stenosis    Head injury    Subdural hematoma after a traumatic brain injury in 1984   Heart murmur    Hypertension    Mixed hyperlipidemia    Seizure (HCC)    Skin cancer    Varicose veins    Vasculitis (HCC) 11/2014    Has the patient had major surgery during 100 days prior to admission? Yes  Family History   family history includes Diabetes in his father.  Current Medications  Current Facility-Administered Medications:    amiodarone (PACERONE) tablet 200 mg, 200 mg, Oral, BID, Roddenberry, Myron G, PA-C, 200 mg at 03/04/23 0109   aspirin EC tablet  325 mg, 325 mg, Oral, Daily, 325 mg at 03/04/23 1610 **OR** aspirin chewable tablet 324 mg, 324 mg, Per Tube, Daily, Knute Neu, RPH   bisacodyl (DULCOLAX) EC tablet 10 mg, 10 mg, Oral, Daily, Alleen Borne, MD, 10 mg at 03/03/23 9604   Chlorhexidine Gluconate Cloth 2 % PADS 6 each, 6 each, Topical, Daily, Alleen Borne, MD, 6 each at 03/03/23 0940   divalproex (DEPAKOTE ER) 24 hr  tablet 1,500 mg, 1,500 mg, Oral, BID, Knute Neu, RPH, 1,500 mg at 03/04/23 5409   Fe Fum-Vit C-Vit B12-FA (TRIGELS-F FORTE) capsule 1 capsule, 1 capsule, Oral, BID, Knute Neu, RPH, 1 capsule at 03/04/23 8119   feeding supplement (ENSURE ENLIVE / ENSURE PLUS) liquid 237 mL, 237 mL, Oral, BID BM, Alleen Borne, MD, 237 mL at 03/02/23 1500   lactulose (CHRONULAC) 10 GM/15ML solution 20 g, 20 g, Oral, Daily PRN, Roddenberry, Myron G, PA-C, 20 g at 03/03/23 0939   metoprolol tartrate (LOPRESSOR) tablet 12.5 mg, 12.5 mg, Oral, BID, Alleen Borne, MD, 12.5 mg at 03/04/23 0821   ondansetron (ZOFRAN) injection 4 mg, 4 mg, Intravenous, Q6H PRN, Alleen Borne, MD   Oral care mouth rinse, 15 mL, Mouth Rinse, PRN, Loreli Slot, MD   oxyCODONE (Oxy IR/ROXICODONE) immediate release tablet 5-10 mg, 5-10 mg, Oral, Q3H PRN, Alleen Borne, MD, 5 mg at 03/01/23 2156   phenytoin (DILANTIN) ER capsule 200 mg, 200 mg, Oral, QHS, Bartle, Payton Doughty, MD, 200 mg at 03/03/23 2136   rosuvastatin (CRESTOR) tablet 10 mg, 10 mg, Oral, QPM, Alleen Borne, MD, 10 mg at 03/03/23 1721   senna-docusate (Senokot-S) tablet 1 tablet, 1 tablet, Oral, QHS, Alleen Borne, MD, 1 tablet at 03/03/23 2136   traMADol (ULTRAM) tablet 50 mg, 50 mg, Oral, Q6H PRN, Alleen Borne, MD, 50 mg at 03/03/23 2334  Patients Current Diet:  Diet Order             Diet heart healthy/carb modified Room service appropriate? Yes; Fluid consistency: Thin  Diet effective now                   Precautions / Restrictions Precautions Precautions: Fall, Sternal Precaution Booklet Issued: No Precaution Comments: reviewed precautions during mobility, pt able to verbalize Restrictions Weight Bearing Restrictions Per Provider Order: Yes RUE Weight Bearing Per Provider Order: Non weight bearing LUE Weight Bearing Per Provider Order: Non weight bearing Other Position/Activity Restrictions: sternal precautions   Has the patient  had 2 or more falls or a fall with injury in the past year? No  Prior Activity Level Community (5-7x/wk): Pt active in the community PTA  Prior Functional Level Self Care: Did the patient need help bathing, dressing, using the toilet or eating? Independent  Indoor Mobility: Did the patient need assistance with walking from room to room (with or without device)? Independent  Stairs: Did the patient need assistance with internal or external stairs (with or without device)? Independent  Functional Cognition: Did the patient need help planning regular tasks such as shopping or remembering to take medications? Independent  Patient Information Are you of Hispanic, Latino/a,or Spanish origin?: A. No, not of Hispanic, Latino/a, or Spanish origin What is your race?: A. White Do you need or want an interpreter to communicate with a doctor or health care staff?: 0. No  Patient's Response To:  Health Literacy and Transportation Is the patient able to respond to health literacy and transportation  needs?: Yes Health Literacy - How often do you need to have someone help you when you read instructions, pamphlets, or other written material from your doctor or pharmacy?: Never In the past 12 months, has lack of transportation kept you from medical appointments or from getting medications?: No In the past 12 months, has lack of transportation kept you from meetings, work, or from getting things needed for daily living?: No  Home Assistive Devices / Equipment Home Equipment: None  Prior Device Use: Indicate devices/aids used by the patient prior to current illness, exacerbation or injury? None of the above  Current Functional Level Cognition  Overall Cognitive Status: Within Functional Limits for tasks assessed Current Attention Level: Sustained, Selective Orientation Level: Oriented X4 Following Commands: Follows one step commands consistently, Follows one step commands inconsistently, Follows  multi-step commands consistently, Follows multi-step commands with increased time Safety/Judgement: Decreased awareness of deficits General Comments: pt able to verbalize sternal precautions and demo how to keep them with function    Extremity Assessment (includes Sensation/Coordination)  Upper Extremity Assessment: Overall WFL for tasks assessed  Lower Extremity Assessment: Defer to PT evaluation    ADLs  Overall ADL's : Needs assistance/impaired Eating/Feeding: Set up, Sitting Eating/Feeding Details (indicate cue type and reason): difficulty problem solving how to use straw in milk carton, set- up assist required Grooming: Set up, Supervision/safety, Standing Upper Body Bathing: Contact guard assist, Cueing for UE precautions, Sitting Lower Body Bathing: Minimal assistance, Sit to/from stand, Cueing for safety, Cueing for compensatory techniques Lower Body Bathing Details (indicate cue type and reason): simulated via LB dressing, MAX A to don socks from bed level Upper Body Dressing : Minimal assistance, Sitting, Cueing for UE precautions, Cueing for compensatory techniques Upper Body Dressing Details (indicate cue type and reason): occasional cues for "staying in the tube" Lower Body Dressing: Minimal assistance, Sit to/from stand Lower Body Dressing Details (indicate cue type and reason): able to bring LE to self with increased effort in recliner Toilet Transfer: Minimal assistance, Regular Toilet, Ambulation Toilet Transfer Details (indicate cue type and reason): simulated chair to bed Toileting- Clothing Manipulation and Hygiene: Maximal assistance, Sit to/from stand, Cueing for compensatory techniques (adhering to sternal precautions) Toileting - Clothing Manipulation Details (indicate cue type and reason): simulated Functional mobility during ADLs: Supervision/safety, Rolling walker (2 wheels) General ADL Comments: Pt on bedpan on entry though no BM. Emphasis on sternal precautions  with mobility and initial thoughts on ADL mgmt to simulate home routine. pt declined toilet transfer or ADLs standing at sink this AM    Mobility  Overal bed mobility: Needs Assistance Bed Mobility: Supine to Sit Rolling: Supervision Sidelying to sit: Contact guard assist Supine to sit: Min assist, HOB elevated Sit to supine: Min assist Sit to sidelying: Mod assist, HOB elevated General bed mobility comments: pt in recliner    Transfers  Overall transfer level: Needs assistance Equipment used: Rolling walker (2 wheels) Transfers: Sit to/from Stand Sit to Stand: Contact guard assist Bed to/from chair/wheelchair/BSC transfer type:: Step pivot Step pivot transfers: Contact guard assist General transfer comment: with hands holding heart pillow. Continuing to work on controlled descent    Ambulation / Gait / Stairs / Psychologist, prison and probation services  Ambulation/Gait Ambulation/Gait assistance: Mod assist, Contact guard assist Gait Distance (Feet): 300 Feet Assistive device: Rolling walker (2 wheels), None Gait Pattern/deviations: Step-through pattern, Trunk flexed, Wide base of support, Decreased stride length General Gait Details: pt able to walk with RW with CGA, L foot abducted, pt reports he  thinks he has ambulated like this for years. Pt ambulated 15' without AD and gait becomes very unsteady, needs mod A for safety. Step length decreases to inches. Pt needs to be able to ambulate without RW to return to work, big change from his baseline Gait velocity: 0.25 m/s Gait velocity interpretation: <1.31 ft/sec, indicative of household ambulator    Posture / Balance Balance Overall balance assessment: Needs assistance Sitting-balance support: Feet supported Sitting balance-Leahy Scale: Good Standing balance support: Reliant on assistive device for balance Standing balance-Leahy Scale: Fair Standing balance comment: no AD for static standing but RW for confidence/energy conservation for hallway  mobility High Level Balance Comments: worked on standing with eyes closed, needed min to mod A as well as SL stance    Special needs/care consideration Skin Ecchymosis: chest/left, right; Surgical incision: chest and External Urinary Catheter   Previous Home Environment (from acute therapy documentation) Living Arrangements: Alone Available Help at Discharge: Friend(s), Other (Comment) (uncertain of accuracy of info, pt stated friend [name] and other friends can assist him) Type of Home: House Home Layout: One level Home Access: Level entry Bathroom Shower/Tub: Health visitor: Standard Home Care Services: No  Discharge Living Setting Plans for Discharge Living Setting: Patient's home Type of Home at Discharge: House Discharge Home Layout: One level Discharge Home Access: Level entry Discharge Bathroom Shower/Tub: Walk-in shower Discharge Bathroom Toilet: Standard Discharge Bathroom Accessibility: No Does the patient have any problems obtaining your medications?: No  Social/Family/Support Systems Patient Roles: Other (Comment) Contact Information: Pt. has mod I goals, to return home alone, Friend will pick him up. Caregiver Availability: 24/7 Discharge Plan Discussed with Primary Caregiver: Yes Is Caregiver In Agreement with Plan?: Yes  Goals Patient/Family Goal for Rehab: PT/OT Mod I Expected length of stay: 7-10 days Pt/Family Agrees to Admission and willing to participate: Yes Program Orientation Provided & Reviewed with Pt/Caregiver Including Roles  & Responsibilities: Yes  Decrease burden of Care through IP rehab admission: not anticipated  Possible need for SNF placement upon discharge: not anticipated  Patient Condition: I have reviewed medical records from [facility], spoken with CM, and patient. I met with patient at the bedside for inpatient rehabilitation assessment.  Patient will benefit from ongoing PT and OT, can actively  participate in 3 hours of therapy a day 5 days of the week, and can make measurable gains during the admission.  Patient will also benefit from the coordinated team approach during an Inpatient Acute Rehabilitation admission.  The patient will receive intensive therapy as well as Rehabilitation physician, nursing, social worker, and care management interventions.  Due to safety, skin/wound care, disease management, medication administration, pain management, and patient education the patient requires 24 hour a day rehabilitation nursing.  The patient is currently Contact Guard A-Mod A with mobility and Contact Guard A-Min A with basic ADLs.  Discharge setting and therapy post discharge at home with home health is anticipated.  Patient has agreed to participate in the Acute Inpatient Rehabilitation Program and will admit today.  Preadmission Screen Completed By:  Jeronimo Greaves, with updates by Wolfgang Phoenix 03/04/2023 2:31 PM ______________________________________________________________________   Discussed status with Dr. Riley Kill on 03/05/23  at 2:07 PM and received approval for admission today.  Admission Coordinator:  Jeronimo Greaves, CCC-SLP, time 2:07 PM/Date 03/05/23    Assessment/Plan: Diagnosis: Debility after AVR for AS Does the need for close, 24 hr/day Medical supervision in concert with the patient's rehab needs make it unreasonable  for this patient to be served in a less intensive setting? Yes Co-Morbidities requiring supervision/potential complications: old TBI, htn, sz disorder Due to bladder management, bowel management, safety, skin/wound care, disease management, medication administration, pain management, and patient education, does the patient require 24 hr/day rehab nursing? Yes Does the patient require coordinated care of a physician, rehab nurse, PT, OT to address physical and functional deficits in the context of the above medical diagnosis(es)? Yes Addressing deficits in  the following areas: balance, endurance, locomotion, strength, transferring, bowel/bladder control, bathing, dressing, feeding, grooming, toileting, and psychosocial support Can the patient actively participate in an intensive therapy program of at least 3 hrs of therapy 5 days a week? Yes The potential for patient to make measurable gains while on inpatient rehab is excellent Anticipated functional outcomes upon discharge from inpatient rehab: modified independent PT, modified independent OT, n/a SLP Estimated rehab length of stay to reach the above functional goals is: 7-10 days Anticipated discharge destination: Home 10. Overall Rehab/Functional Prognosis: excellent   MD Signature: Ranelle Oyster, MD, [facility] Physical Medicine & Rehabilitation Medical Director Rehabilitation Services 03/05/2023

## 2023-03-04 NOTE — Progress Notes (Signed)
      [address]       [place] [address]             [phone]     24 Days Post-Op Aortic Valve Replacement   12 Days Post-Op Procedure(s) (LRB): SUBXYPHOID PERICARDIAL WINDOW (N/A) TRANSESOPHAGEAL ECHOCARDIOGRAM (TEE) (N/A) Subjective:   Awake and alert, waiting for breakfast. No new concerns.   Unable to get insurance approval for [facility].  Now seeking insurance approval for CIR.   Objective: Vital signs in last 24 hours: Temp:  [97.7 F (36.5 C)-98.2 F (36.8 C)] 98.1 F (36.7 C) (01/27 0402) Pulse Rate:  [67-80] 68 (01/27 0402) Cardiac Rhythm: Normal sinus rhythm (01/26 1900) Resp:  [18-23] 18 (01/27 0402) BP: (112-147)/(58-83) 112/72 (01/27 0402) SpO2:  [96 %-99 %] 99 % (01/27 0402) Weight:  [97 kg] 97 kg (01/27 0402)     Intake/Output from previous day: 01/26 0701 - 01/27 0700 In: 240 [P.O.:240] Out: 501 [Urine:500; Stool:1] Intake/Output this shift: No intake/output data recorded.  General appearance: alert, cooperative, and no distress Neurologic: no focal deficits Heart: stable SR past 24 hours with no breadycardia  Lungs: normal work of breathing and clear breath sounds. Sats stable on RA. Abdomen: soft, no tenderness Extremities: no peripheral edema Wound: the sternotomy incision is intact and dry. Sutures are out.  Lab Results: No results for input(s): "WBC", "HGB", "HCT", "PLT" in the last 72 hours.  BMET:  Recent Labs    03/03/23 0907 03/04/23 0305  NA 141 139  K 4.2 4.2  CL 109 107  CO2 21* 21*  GLUCOSE 142* 90  BUN 17 17  CREATININE 1.18 1.20  CALCIUM 9.4 9.0    PT/INR: No results for input(s): "LABPROT", "INR" in the last 72 hours. ABG    Component Value Date/Time   PHART 7.261 (L) 02/20/2023 0922   HCO3 13.1 (L) 02/20/2023 0922   TCO2 14 (L) 02/20/2023 0922   ACIDBASEDEF 13.0 (H) 02/20/2023 0922   O2SAT 99 02/20/2023 0922   CBG (last 3)  No results for input(s): "GLUCAP" in the last 72  hours.   Assessment/Plan: S/P Procedure(s) (LRB): SUBXYPHOID PERICARDIAL WINDOW (N/A) TRANSESOPHAGEAL ECHOCARDIOGRAM (TEE) (N/A)  -POD24 bioprosthetic AVR for severe bicuspid aortic stenosis -POD12 subxiphoid pericardial drainage of a large pericardial effusion and drainage of left pleural effusion.  Hemodynamically stable.  On ASA, low-dose metoprolol, Crestor.  -Post-op atrial fibrillation- Stable SR.  Continue oral amiodarone at 200mg  BID.  -History of head injury and seizure disorder- Depakote and Dilantin resumed. Neuro status at baseline.   -Renal- mild renal insuffiencey post-op- creat has normalized and at baseline.   He is below pre-op Wt with no LE edema.  -Expected acute blood loss anemia-  continue Fe++ replacement.  -GI- tolerating PO's and having appropriate bowel function.  -Disposition- living alone prior to surgery but he is not yet able to care for himself unassisted.  PT /OT recommended short-term SNF prior to returning home and Mr. Mark has agreed to this plan.  He has had bed offers but we have been unable to get insurance approval for admission.  He has been evaluated by the inpatient rehab team and a request insurance approval has been made for that unit.    LOS: 24 days    Leary Roca, PA-C 03/04/2023

## 2023-03-04 NOTE — Progress Notes (Signed)
Occupational Therapy Treatment Patient Details Name: Jerry [name] MRN: [id number] DOB: March 10, 1965 Today's Date: 03/04/2023   History of present illness 58 yo male admitted 02/07/23 with severe AS. S/p AVR on 1/3. S/p TEE and subxyphoid pericardial window and insertion of Left pleural chest tube on 02/20/23.  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   OT comments  Patient with good progress toward patient focused goals.  He is improving his functional status, needing closer to Min A for ADL and in room mobility/toileting, but continues to need a lot of precaution and compensatory cues.  Patient lives alone and needs to be independent/Mod I to have a safe transition home.  Patient will benefit from intensive inpatient follow up therapy, >3 hours/day.  Patient has good participation and should reach a Mod I level.        If plan is discharge home, recommend the following:  Assistance with cooking/housework;Direct supervision/assist for medications management;Direct supervision/assist for financial management;Assist for transportation;Help with stairs or ramp for entrance;Supervision due to cognitive status;A little help with bathing/dressing/bathroom   Equipment Recommendations  None recommended by OT    Recommendations for Other Services      Precautions / Restrictions Precautions Precautions: Fall;Sternal Restrictions Other Position/Activity Restrictions: sternal precautions       Mobility Bed Mobility Overal bed mobility: Needs Assistance Bed Mobility: Supine to Sit     Supine to sit: Min assist, HOB elevated          Transfers Overall transfer level: Needs assistance Equipment used: Rolling walker (2 wheels) Transfers: Sit to/from Stand, Bed to chair/wheelchair/BSC Sit to Stand: Contact guard assist, Min assist, From elevated surface     Step pivot transfers: Contact guard assist           Balance Overall balance assessment: Needs  assistance Sitting-balance support: Feet supported Sitting balance-Leahy Scale: Good     Standing balance support: Reliant on assistive device for balance Standing balance-Leahy Scale: Fair                             ADL either performed or assessed with clinical judgement   ADL Overall ADL's : Needs assistance/impaired     Grooming: Set up;Supervision/safety;Standing   Upper Body Bathing: Contact guard assist;Cueing for UE precautions;Sitting   Lower Body Bathing: Minimal assistance;Sit to/from stand;Cueing for safety;Cueing for compensatory techniques   Upper Body Dressing : Minimal assistance;Sitting;Cueing for UE precautions;Cueing for compensatory techniques   Lower Body Dressing: Minimal assistance;Sit to/from stand   Toilet Transfer: Minimal Holiday representative;Ambulation                  Extremity/Trunk Assessment Upper Extremity Assessment Upper Extremity Assessment: Overall WFL for tasks assessed   Lower Extremity Assessment Lower Extremity Assessment: Defer to PT evaluation   Cervical / Trunk Assessment Cervical / Trunk Assessment: Normal    Vision Baseline Vision/History: 1 Wears glasses Patient Visual Report: No change from baseline     Perception Perception Perception: Not tested   Praxis Praxis Praxis: Not tested    Cognition Arousal: Alert Behavior During Therapy: WFL for tasks assessed/performed, Flat affect Overall Cognitive Status: Within Functional Limits for tasks assessed  Pertinent Vitals/ Pain       Pain Assessment Pain Assessment: No/denies pain Pain Intervention(s): Monitored during session                                                          Frequency  Min 1X/week        Progress Toward Goals  OT Goals(current goals can now be found in the care plan section)  Progress towards OT  goals: Progressing toward goals  Acute Rehab OT Goals OT Goal Formulation: With patient Time For Goal Achievement: 03/11/23 Potential to Achieve Goals: Good  Plan      Co-evaluation                 AM-PAC OT "6 Clicks" Daily Activity     Outcome Measure   Help from another person eating meals?: None Help from another person taking care of personal grooming?: A Little Help from another person toileting, which includes using toliet, bedpan, or urinal?: A Little Help from another person bathing (including washing, rinsing, drying)?: A Little Help from another person to put on and taking off regular upper body clothing?: A Little Help from another person to put on and taking off regular lower body clothing?: A Little 6 Click Score: 19    End of Session Equipment Utilized During Treatment: Gait belt;Rolling walker (2 wheels)  OT Visit Diagnosis: Unsteadiness on feet (R26.81);Other abnormalities of gait and mobility (R26.89);Muscle weakness (generalized) (M62.81);Other (comment)   Activity Tolerance Patient tolerated treatment well   Patient Left in chair;with call bell/phone within reach   Nurse Communication Mobility status        Time: 4098-1191 OT Time Calculation (min): 18 min  Charges: OT General Charges $OT Visit: 1 Visit OT Treatments $Self Care/Home Management : 8-22 mins  03/04/2023  RP, OTR/L  Acute Rehabilitation Services  Office:  [phone]   Suzanna Obey 03/04/2023, 9:10 AM

## 2023-03-04 NOTE — Plan of Care (Signed)
Patient remains on MC-4E at time of writing. No acute changes overnight.   Problem: Clinical Measurements: Goal: Ability to maintain clinical measurements within normal limits will improve Outcome: Progressing Goal: Will remain free from infection Outcome: Progressing Goal: Diagnostic test results will improve Outcome: Progressing Goal: Respiratory complications will improve Outcome: Progressing Goal: Cardiovascular complication will be avoided Outcome: Progressing   Problem: Activity: Goal: Risk for activity intolerance will decrease Outcome: Progressing   Problem: Nutrition: Goal: Adequate nutrition will be maintained Outcome: Progressing   Problem: Coping: Goal: Level of anxiety will decrease Outcome: Progressing   Problem: Elimination: Goal: Will not experience complications related to bowel motility Outcome: Progressing Goal: Will not experience complications related to urinary retention Outcome: Progressing   Problem: Pain Management: Goal: General experience of comfort will improve Outcome: Progressing   Problem: Safety: Goal: Ability to remain free from injury will improve Outcome: Progressing   Problem: Skin Integrity: Goal: Risk for impaired skin integrity will decrease Outcome: Progressing   Problem: Education: Goal: Will demonstrate proper wound care and an understanding of methods to prevent future damage Outcome: Progressing Goal: Knowledge of disease or condition will improve Outcome: Progressing Goal: Knowledge of the prescribed therapeutic regimen will improve Outcome: Progressing   Problem: Activity: Goal: Risk for activity intolerance will decrease Outcome: Progressing   Problem: Cardiac: Goal: Will achieve and/or maintain hemodynamic stability Outcome: Progressing   Problem: Clinical Measurements: Goal: Postoperative complications will be avoided or minimized Outcome: Progressing   Problem: Respiratory: Goal: Respiratory status  will improve Outcome: Progressing   Problem: Skin Integrity: Goal: Wound healing without signs and symptoms of infection Outcome: Progressing Goal: Risk for impaired skin integrity will decrease Outcome: Progressing   Problem: Education: Goal: Knowledge of disease or condition will improve Outcome: Progressing Goal: Knowledge of the prescribed therapeutic regimen will improve Outcome: Progressing   Problem: Activity: Goal: Risk for activity intolerance will decrease Outcome: Progressing   Problem: Cardiac: Goal: Will achieve and/or maintain hemodynamic stability Outcome: Progressing   Problem: Clinical Measurements: Goal: Postoperative complications will be avoided or minimized Outcome: Progressing   Problem: Respiratory: Goal: Respiratory status will improve Outcome: Progressing   Problem: Pain Management: Goal: Pain level will decrease Outcome: Progressing   Problem: Skin Integrity: Goal: Wound healing without signs and symptoms infection will improve Outcome: Progressing

## 2023-03-04 NOTE — Progress Notes (Signed)
CARDIAC REHAB PHASE I    Pt awaiting CIR approval. Worked with PT/OT this am. Will return to offer walk later today as time allows.  Woodroe Chen, RN BSN 03/04/2023 10:50 AM

## 2023-03-05 ENCOUNTER — Other Ambulatory Visit: Payer: Self-pay

## 2023-03-05 ENCOUNTER — Encounter (HOSPITAL_COMMUNITY): Payer: Self-pay | Admitting: Physical Medicine and Rehabilitation

## 2023-03-05 ENCOUNTER — Inpatient Hospital Stay (HOSPITAL_COMMUNITY)
Admission: AD | Admit: 2023-03-05 | Discharge: 2023-03-12 | DRG: 945 | Disposition: A | Discharge status: Home Health | Payer: BC Managed Care – PPO | Source: Intra-hospital | Attending: Physical Medicine and Rehabilitation | Admitting: Physical Medicine and Rehabilitation

## 2023-03-05 DIAGNOSIS — E782 Mixed hyperlipidemia: Secondary | ICD-10-CM | POA: Diagnosis present

## 2023-03-05 DIAGNOSIS — N179 Acute kidney failure, unspecified: Secondary | ICD-10-CM | POA: Diagnosis present

## 2023-03-05 DIAGNOSIS — K5901 Slow transit constipation: Secondary | ICD-10-CM | POA: Diagnosis not present

## 2023-03-05 DIAGNOSIS — Z833 Family history of diabetes mellitus: Secondary | ICD-10-CM | POA: Diagnosis not present

## 2023-03-05 DIAGNOSIS — Z8661 Personal history of infections of the central nervous system: Secondary | ICD-10-CM | POA: Diagnosis not present

## 2023-03-05 DIAGNOSIS — I10 Essential (primary) hypertension: Secondary | ICD-10-CM | POA: Diagnosis present

## 2023-03-05 DIAGNOSIS — Z87891 Personal history of nicotine dependence: Secondary | ICD-10-CM | POA: Diagnosis not present

## 2023-03-05 DIAGNOSIS — G4700 Insomnia, unspecified: Secondary | ICD-10-CM | POA: Diagnosis present

## 2023-03-05 DIAGNOSIS — K5900 Constipation, unspecified: Secondary | ICD-10-CM | POA: Diagnosis present

## 2023-03-05 DIAGNOSIS — Z9889 Other specified postprocedural states: Secondary | ICD-10-CM | POA: Diagnosis not present

## 2023-03-05 DIAGNOSIS — Z85828 Personal history of other malignant neoplasm of skin: Secondary | ICD-10-CM

## 2023-03-05 DIAGNOSIS — I3139 Other pericardial effusion (noninflammatory): Secondary | ICD-10-CM | POA: Diagnosis present

## 2023-03-05 DIAGNOSIS — G40909 Epilepsy, unspecified, not intractable, without status epilepticus: Secondary | ICD-10-CM | POA: Diagnosis present

## 2023-03-05 DIAGNOSIS — D62 Acute posthemorrhagic anemia: Secondary | ICD-10-CM | POA: Diagnosis present

## 2023-03-05 DIAGNOSIS — Z79899 Other long term (current) drug therapy: Secondary | ICD-10-CM | POA: Diagnosis not present

## 2023-03-05 DIAGNOSIS — I480 Paroxysmal atrial fibrillation: Secondary | ICD-10-CM | POA: Diagnosis present

## 2023-03-05 DIAGNOSIS — I4891 Unspecified atrial fibrillation: Secondary | ICD-10-CM | POA: Diagnosis not present

## 2023-03-05 DIAGNOSIS — Z952 Presence of prosthetic heart valve: Secondary | ICD-10-CM | POA: Diagnosis not present

## 2023-03-05 DIAGNOSIS — R5381 Other malaise: Principal | ICD-10-CM | POA: Diagnosis present

## 2023-03-05 DIAGNOSIS — Z87820 Personal history of traumatic brain injury: Secondary | ICD-10-CM

## 2023-03-05 DIAGNOSIS — Z8679 Personal history of other diseases of the circulatory system: Secondary | ICD-10-CM | POA: Diagnosis not present

## 2023-03-05 LAB — GLUCOSE, CAPILLARY: Glucose-Capillary: 103 mg/dL — ABNORMAL HIGH (ref 70–99)

## 2023-03-05 MED ORDER — CHLORHEXIDINE GLUCONATE CLOTH 2 % EX PADS
6.0000 | MEDICATED_PAD | Freq: Every day | CUTANEOUS | Status: DC
  Administered 2023-03-06 – 2023-03-09 (×4): 6 via TOPICAL

## 2023-03-05 MED ORDER — ALUM & MAG HYDROXIDE-SIMETH 200-200-20 MG/5ML PO SUSP: 30.0000 mL | ORAL | Status: DC | PRN

## 2023-03-05 MED ORDER — METOPROLOL TARTRATE 12.5 MG HALF TABLET
12.5000 mg | ORAL_TABLET | Freq: Two times a day (BID) | ORAL | Status: DC
  Administered 2023-03-05 – 2023-03-12 (×14): 12.5 mg via ORAL
  Filled 2023-03-05 (×14): qty 1

## 2023-03-05 MED ORDER — ROSUVASTATIN CALCIUM 5 MG PO TABS
10.0000 mg | ORAL_TABLET | Freq: Every evening | ORAL | Status: DC
Start: 2023-03-05 — End: 2023-03-12
  Administered 2023-03-05 – 2023-03-11 (×7): 10 mg via ORAL
  Filled 2023-03-05 (×7): qty 2

## 2023-03-05 MED ORDER — DIPHENHYDRAMINE HCL 25 MG PO CAPS: 25.0000 mg | ORAL_CAPSULE | Freq: Four times a day (QID) | ORAL | Status: DC | PRN

## 2023-03-05 MED ORDER — METOPROLOL TARTRATE 25 MG PO TABS: 12.5000 mg | ORAL_TABLET | Freq: Two times a day (BID) | ORAL | Status: DC

## 2023-03-05 MED ORDER — PROCHLORPERAZINE MALEATE 5 MG PO TABS: 5.0000 mg | ORAL_TABLET | Freq: Four times a day (QID) | ORAL | Status: DC | PRN

## 2023-03-05 MED ORDER — ACETAMINOPHEN 325 MG PO TABS: 325.0000 mg | ORAL_TABLET | ORAL | Status: DC | PRN

## 2023-03-05 MED ORDER — AMIODARONE HCL 200 MG PO TABS
200.0000 mg | ORAL_TABLET | Freq: Two times a day (BID) | ORAL | Status: DC
  Administered 2023-03-05 – 2023-03-12 (×14): 200 mg via ORAL
  Filled 2023-03-05 (×14): qty 1

## 2023-03-05 MED ORDER — ASPIRIN 81 MG PO CHEW: 324.0000 mg | CHEWABLE_TABLET | Freq: Every day | ORAL | Status: DC

## 2023-03-05 MED ORDER — PROCHLORPERAZINE 25 MG RE SUPP: 12.5000 mg | Freq: Four times a day (QID) | RECTAL | Status: DC | PRN

## 2023-03-05 MED ORDER — MELATONIN 5 MG PO TABS
5.0000 mg | ORAL_TABLET | Freq: Every evening | ORAL | Status: DC | PRN
  Administered 2023-03-06: 5 mg via ORAL
  Filled 2023-03-05: qty 1

## 2023-03-05 MED ORDER — PROCHLORPERAZINE EDISYLATE 10 MG/2ML IJ SOLN
5.0000 mg | Freq: Four times a day (QID) | INTRAMUSCULAR | Status: DC | PRN
  Administered 2023-03-11: 10 mg via INTRAVENOUS
  Filled 2023-03-05: qty 2

## 2023-03-05 MED ORDER — TRAMADOL HCL 50 MG PO TABS: 50.0000 mg | ORAL_TABLET | Freq: Four times a day (QID) | ORAL | Status: DC | PRN

## 2023-03-05 MED ORDER — PROSOURCE PLUS PO LIQD
30.0000 mL | Freq: Two times a day (BID) | ORAL | Status: DC
  Administered 2023-03-06 – 2023-03-10 (×5): 30 mL via ORAL
  Filled 2023-03-05 (×5): qty 30

## 2023-03-05 MED ORDER — OXYCODONE HCL 5 MG PO TABS
5.0000 mg | ORAL_TABLET | ORAL | Status: DC | PRN
Start: 2023-03-05 — End: 2023-03-07
  Administered 2023-03-06: 5 mg via ORAL
  Filled 2023-03-05: qty 1

## 2023-03-05 MED ORDER — PHENYTOIN SODIUM EXTENDED 100 MG PO CAPS
200.0000 mg | ORAL_CAPSULE | Freq: Every day | ORAL | Status: DC
  Administered 2023-03-05 – 2023-03-11 (×7): 200 mg via ORAL
  Filled 2023-03-05 (×8): qty 2

## 2023-03-05 MED ORDER — ENOXAPARIN SODIUM 40 MG/0.4ML IJ SOSY
40.0000 mg | PREFILLED_SYRINGE | INTRAMUSCULAR | Status: DC
Start: 2023-03-05 — End: 2023-03-12
  Administered 2023-03-05 – 2023-03-11 (×7): 40 mg via SUBCUTANEOUS
  Filled 2023-03-05 (×7): qty 0.4

## 2023-03-05 MED ORDER — SENNOSIDES-DOCUSATE SODIUM 8.6-50 MG PO TABS
2.0000 | ORAL_TABLET | Freq: Every day | ORAL | Status: DC
  Administered 2023-03-07 – 2023-03-12 (×6): 2 via ORAL
  Filled 2023-03-05 (×6): qty 2

## 2023-03-05 MED ORDER — DIVALPROEX SODIUM ER 500 MG PO TB24
1500.0000 mg | ORAL_TABLET | Freq: Two times a day (BID) | ORAL | Status: DC
  Administered 2023-03-05 – 2023-03-12 (×14): 1500 mg via ORAL
  Filled 2023-03-05 (×9): qty 3
  Filled 2023-03-05: qty 2
  Filled 2023-03-05 (×6): qty 3

## 2023-03-05 MED ORDER — FE FUM-VIT C-VIT B12-FA 460-60-0.01-1 MG PO CAPS
1.0000 | ORAL_CAPSULE | Freq: Two times a day (BID) | ORAL | Status: DC
  Administered 2023-03-05 – 2023-03-12 (×14): 1 via ORAL
  Filled 2023-03-05 (×14): qty 1

## 2023-03-05 MED ORDER — ORAL CARE MOUTH RINSE: 15.0000 mL | OROMUCOSAL | Status: DC | PRN

## 2023-03-05 MED ORDER — GUAIFENESIN-DM 100-10 MG/5ML PO SYRP
5.0000 mL | ORAL_SOLUTION | Freq: Four times a day (QID) | ORAL | Status: DC | PRN
Start: 2023-03-05 — End: 2023-03-12

## 2023-03-05 MED ORDER — ASPIRIN 325 MG PO TBEC
325.0000 mg | DELAYED_RELEASE_TABLET | Freq: Every day | ORAL | Status: DC
  Administered 2023-03-06 – 2023-03-12 (×7): 325 mg via ORAL
  Filled 2023-03-05 (×7): qty 1

## 2023-03-05 MED ORDER — BISACODYL 10 MG RE SUPP: 10.0000 mg | Freq: Every day | RECTAL | Status: DC | PRN

## 2023-03-05 MED ORDER — FLEET ENEMA RE ENEM: 1.0000 | ENEMA | Freq: Once | RECTAL | Status: DC | PRN

## 2023-03-05 MED ORDER — SENNOSIDES-DOCUSATE SODIUM 8.6-50 MG PO TABS: 1.0000 | ORAL_TABLET | Freq: Every day | ORAL | Status: DC

## 2023-03-05 NOTE — Progress Notes (Addendum)
      [address]       [place] [address]             [phone]     25 Days Post-Op Aortic Valve Replacement   13 Days Post-Op Procedure(s) (LRB): SUBXYPHOID PERICARDIAL WINDOW (N/A) TRANSESOPHAGEAL ECHOCARDIOGRAM (TEE) (N/A) Subjective:   Awake and alert.  No new concerns.   The TOD team is working on Therapist, occupational for [employer].   Objective: Vital signs in last 24 hours: Temp:  [98.1 F (36.7 C)-98.6 F (37 C)] 98.1 F (36.7 C) (01/28 0305) Pulse Rate:  [70-78] 70 (01/28 0305) Cardiac Rhythm: Normal sinus rhythm (01/27 2100) Resp:  [16-20] 16 (01/28 0600) BP: (107-121)/(60-83) 120/66 (01/28 0305) SpO2:  [95 %-98 %] 98 % (01/28 0305) Weight:  [94 kg] 94 kg (01/28 0305)     Intake/Output from previous day: 01/27 0701 - 01/28 0700 In: 480 [P.O.:480] Out: 1350 [Urine:1350] Intake/Output this shift: No intake/output data recorded.  General appearance: alert, cooperative, and no distress Neurologic: no focal deficits Heart: stable SR  Lungs: normal work of breathing and clear breath sounds.  Abdomen: soft, no tenderness Extremities: no peripheral edema Wound: the sternotomy incision is intact and dry. Sutures are out.  Lab Results: No results for input(s): "WBC", "HGB", "HCT", "PLT" in the last 72 hours.  BMET:  Recent Labs    03/03/23 0907 03/04/23 0305  NA 141 139  K 4.2 4.2  CL 109 107  CO2 21* 21*  GLUCOSE 142* 90  BUN 17 17  CREATININE 1.18 1.20  CALCIUM 9.4 9.0    PT/INR: No results for input(s): "LABPROT", "INR" in the last 72 hours. ABG    Component Value Date/Time   PHART 7.261 (L) 02/20/2023 0922   HCO3 13.1 (L) 02/20/2023 0922   TCO2 14 (L) 02/20/2023 0922   ACIDBASEDEF 13.0 (H) 02/20/2023 0922   O2SAT 99 02/20/2023 0922   CBG (last 3)  No results for input(s): "GLUCAP" in the last 72 hours.   Assessment/Plan: S/P Procedure(s) (LRB): SUBXYPHOID PERICARDIAL WINDOW (N/A) TRANSESOPHAGEAL ECHOCARDIOGRAM (TEE)  (N/A)  -POD25 bioprosthetic AVR for severe bicuspid aortic stenosis -POD13 subxiphoid pericardial drainage of a large pericardial effusion and drainage of left pleural effusion.  Hemodynamically stable.  On ASA, low-dose metoprolol, Crestor.  -Post-op atrial fibrillation- Stable SR.  Continue oral amiodarone at 200mg  BID.  -History of head injury and seizure disorder- Depakote and Dilantin resumed. Neuro status at baseline.   -Renal- mild renal insuffiencey post-op- creat has normalized and at baseline.   He is below pre-op Wt with no LE edema.  -Expected acute blood loss anemia-  continue Fe++ replacement.  -GI- tolerating PO's and having appropriate bowel function.  -Disposition- living alone prior to surgery but he is not yet able to care for himself unassisted.  PT /OT recommended short-term SNF prior to returning home and Mr. Pietrzyk has agreed to that plan.  He has had bed offers but we have been unable to get insurance approval for admission.  He has been evaluated by the inpatient rehab team and a request insurance approval has been made for that unit.    LOS: 25 days    Leary Roca, PA-C 03/05/2023  Agree with above Dispo planning  Kosisochukwu Goldberg O Korde Jeppsen

## 2023-03-05 NOTE — Progress Notes (Signed)
Signed     Expand All Collapse All          Physical Medicine and Rehabilitation Consult Reason for Consult: Evaluate appropriateness for Inpatient Rehab Referring Physician: Dr. Bill Salinas       HPI: Jerry [name] is a 58 y.o. male with PMHx of  has a past medical history of Aortic stenosis, Head injury, Heart murmur, Hypertension, Mixed hyperlipidemia, Seizure (HCC), Skin cancer, Varicose veins, and Vasculitis (HCC) (11/2014). . They were admitted to [facility] on 02/08/2023 for aortic valve replacement following outpatient cardiac workup revealing severe bicuspid aortic valve stenosis with cardiac catheterization showing no significant coronary disease.  He underwent bioprosthetic AV valve replacement on 1-3 with Dr. Laneta Simmers, developed postoperative atrial fibrillation and was started on amiodarone day of surgery with good rate control initially, developed SVT versus a flutter 1-13 with additional amiodarone bolus needed, EP consulted and suggested medication management with amiodarone and bisoprolol.  On 1-15, he underwent pericardial window due to echocardiogram showing large circumferential pericardial effusion.  Hospitalization was otherwise complicated by AKI due to postop diuresis, constipation, acute blood loss anemia, ongoing paroxysmal atrial fibrillation, and pre-existing seizure disorder.  PM&R was consulted to evaluate appropriateness for IPR admission.    Per therapy notes, patient is participating well in therapies, limited by sternal precautions with nonweightbearing bilateral upper extremities.  He is ambulating up to 200 feet with supervision and rolling walker, requiring contact-guard assist for transfers, and min assist for bed mobility.  He is min assist for ADLs with some truncal lean complicating sitting and pre-existing cognitive deficits from TBI.   Per patient, he has no 24/7 caregiver support at home but does have friends nearby who can check in periodically,  lives alone in 1 story home with 1 STE through porch, and was independent without AD prior to admission.  He works as a [employer] manager and is motivated to return home and to work as soon as possible.          Review of Systems  Constitutional:  Negative for chills and fever.  HENT:  Negative for hearing loss.   Eyes:  Negative for blurred vision and double vision.  Respiratory:  Negative for cough and shortness of breath.   Cardiovascular:  Negative for chest pain and palpitations.  Gastrointestinal:  Positive for constipation. Negative for abdominal pain, diarrhea, heartburn, nausea and vomiting.  Genitourinary:  Negative for frequency and urgency.  Musculoskeletal:  Negative for back pain, myalgias and neck pain.  Skin:  Negative for itching and rash.  Neurological:  Negative for tremors, sensory change, weakness and headaches.  Psychiatric/Behavioral:  Negative for depression, memory loss, substance abuse and suicidal ideas. The patient is nervous/anxious and has insomnia.         Past Medical History:  Diagnosis Date   Aortic stenosis     Head injury      Subdural hematoma after a traumatic brain injury in 1984   Heart murmur     Hypertension     Mixed hyperlipidemia     Seizure (HCC)     Skin cancer     Varicose veins     Vasculitis (HCC) 11/2014             Past Surgical History:  Procedure Laterality Date   AORTIC VALVE REPLACEMENT N/A 02/08/2023    Procedure: AORTIC VALVE REPLACEMENT (AVR) USING INSPIRIS AORTIC VALVE SIZE ;  Surgeon: Alleen Borne, MD;  Location: [facility] OR;  Service: Open Heart Surgery;  Laterality: N/A;   RIGHT/LEFT HEART CATH AND CORONARY ANGIOGRAPHY N/A 12/10/2022    Procedure: RIGHT/LEFT HEART CATH AND CORONARY ANGIOGRAPHY;  Surgeon: Tonny Bollman, MD;  Location: [facility] INVASIVE CV LAB;  Service: Cardiovascular;  Laterality: N/A;   SUBXYPHOID PERICARDIAL WINDOW N/A 02/20/2023    Procedure: SUBXYPHOID PERICARDIAL WINDOW;  Surgeon: Alleen Borne, MD;   Location: [facility] OR;  Service: Thoracic;  Laterality: N/A;   Surgical plate in head   1984   TEE WITHOUT CARDIOVERSION N/A 02/08/2023    Procedure: TRANSESOPHAGEAL ECHOCARDIOGRAM;  Surgeon: Alleen Borne, MD;  Location: [facility] OR;  Service: Open Heart Surgery;  Laterality: N/A;   TEE WITHOUT CARDIOVERSION N/A 02/20/2023    Procedure: TRANSESOPHAGEAL ECHOCARDIOGRAM (TEE);  Surgeon: Alleen Borne, MD;  Location: [facility] OR;  Service: Thoracic;  Laterality: N/A;   TOE INTERNAL FIXATION W/ COMPRESSION SCREW Right               Family History  Problem Relation Age of Onset   Diabetes Father          Social History:  reports that he quit smoking about 8 years ago. His smoking use included cigarettes. He has never used smokeless tobacco. He reports that he does not drink alcohol and does not use drugs. Allergies:  Allergies  No Known Allergies         Medications Prior to Admission  Medication Sig Dispense Refill   amLODipine (NORVASC) 10 MG tablet Take 1 tablet (10 mg total) by mouth daily. (Patient taking differently: Take 10 mg by mouth in the morning.) 180 tablet 3   Cholecalciferol (VITAMIN D-3) 25 MCG (1000 UT) CAPS Take 1,000 Units by mouth in the morning.       divalproex (DEPAKOTE ER) 500 MG 24 hr tablet TAKE 3 TABLETS BY MOUTH IN THE MORNING AND 3 IN THE EVENING 540 tablet 3   phenytoin (DILANTIN) 100 MG ER capsule Take 2 capsules (200 mg total) by mouth at bedtime. 180 capsule 3   rosuvastatin (CRESTOR) 10 MG tablet Take 10 mg by mouth every evening.              Home: Home Living Family/patient expects to be discharged to:: Private residence Living Arrangements: Alone Available Help at Discharge: Friend(s), Other (Comment) (uncertain of accuracy of info, pt stated friend [name] and other friends can assist him) Type of Home: House Home Access: Level entry Home Layout: One level Bathroom Shower/Tub: Health visitor: Standard Home Equipment: None  Functional  History: Prior Function Prior Level of Function : Independent/Modified Independent, Driving ADLs Comments: Ind Functional Status:  Mobility: Bed Mobility Overal bed mobility: Needs Assistance Bed Mobility: Rolling, Sidelying to Sit, Sit to Supine Rolling: Supervision Sidelying to sit: Contact guard assist Supine to sit: Min assist, HOB elevated Sit to supine: Min assist Sit to sidelying: Mod assist, HOB elevated General bed mobility comments: performed technique without cues, though difficulty lifting trunk needing A for trunk upright to prevent UE use; to supine pt encouraged to scoot up closer to top of bed prior to lying down then still slid down needing +2 A to scoot up Transfers Overall transfer level: Needs assistance Equipment used: Rolling walker (2 wheels) Transfers: Sit to/from Stand Sit to Stand: Contact guard assist Bed to/from chair/wheelchair/BSC transfer type:: Step pivot Step pivot transfers: Min assist (with use of Eva walker) General transfer comment: cues for momentum, needs bed up higher to stand, noted stand to sit with uncontrolled descent so practiced with  slower controlled descent Ambulation/Gait Ambulation/Gait assistance: Supervision Gait Distance (Feet): 200 Feet Assistive device: Rolling walker (2 wheels) Gait Pattern/deviations: Step-through pattern, Trunk flexed, Wide base of support, Decreased stride length General Gait Details: slow pace, stopping to rest throughout Gait velocity: 0.25 m/s Gait velocity interpretation: <1.31 ft/sec, indicative of household ambulator   ADL: ADL Overall ADL's : Needs assistance/impaired Eating/Feeding: Set up, Sitting Eating/Feeding Details (indicate cue type and reason): difficulty problem solving how to use straw in milk carton, set- up assist required Grooming: Set up, Supervision/safety, Sitting Upper Body Bathing: Contact guard assist, Sitting Lower Body Bathing: Maximal assistance, Bed level Lower Body  Bathing Details (indicate cue type and reason): simulated via LB dressing, MAX A to don socks from bed level Upper Body Dressing : Minimal assistance, Sitting Upper Body Dressing Details (indicate cue type and reason): occasional cues for "staying in the tube" Lower Body Dressing: Minimal assistance, Sitting/lateral leans, Sit to/from stand Lower Body Dressing Details (indicate cue type and reason): able to bring LE to self with increased effort in recliner Toilet Transfer: Minimal assistance, Contact guard assist, Cueing for safety, Cueing for sequencing, Ambulation, BSC/3in1 (Eva walker; Min assist to power up while holding heart pillow; Largely CGA with occasiona Min assist needed to maintain balance during transfer) Toilet Transfer Details (indicate cue type and reason): simulated chair to bed Toileting- Clothing Manipulation and Hygiene: Maximal assistance, Sit to/from stand, Cueing for compensatory techniques (adhering to sternal precautions) Toileting - Clothing Manipulation Details (indicate cue type and reason): simulated Functional mobility during ADLs: Supervision/safety, Rolling walker (2 wheels) General ADL Comments: Pt on bedpan on entry though no BM. Emphasis on sternal precautions with mobility and initial thoughts on ADL mgmt to simulate home routine. pt declined toilet transfer or ADLs standing at sink this AM   Cognition: Cognition Overall Cognitive Status: Within Functional Limits for tasks assessed Orientation Level: Oriented X4 Cognition Arousal: Alert Behavior During Therapy: WFL for tasks assessed/performed, Flat affect Overall Cognitive Status: Within Functional Limits for tasks assessed Area of Impairment: Attention, Memory, Following commands, Safety/judgement, Awareness, Problem solving Orientation Level: Disoriented to, Time Current Attention Level: Sustained, Selective Memory: Decreased short-term memory, Decreased recall of precautions Following Commands:  Follows one step commands consistently, Follows one step commands inconsistently, Follows multi-step commands consistently, Follows multi-step commands with increased time Safety/Judgement: Decreased awareness of deficits Awareness: Emergent Problem Solving: Slow processing, Decreased initiation, Requires verbal cues General Comments: cues for function with sternal precautions   Blood pressure 126/72, pulse 72, temperature 97.6 F (36.4 C), temperature source Axillary, resp. rate 15, height 5\' 11"  (1.803 m), weight 94.2 kg, SpO2 97%.   Physical Exam Constitutional: No apparent distress. Appropriate appearance for age.  HENT: No JVD. Neck Supple. Trachea midline. Atraumatic; prior craniotomy well healed.  Eyes: PERRLA. EOMI. Visual fields grossly intact.  Cardiovascular: RRR, no murmurs/rub/gallops. No Edema. Peripheral pulses 2+  Respiratory: CTAB. No rales, rhonchi, or wheezing. On RA.  Abdomen: + bowel sounds, normoactive. No distention or tenderness.  GU: Not examined. +Purwick, draining dark urine.  Skin: C/D/I. No apparent lesions. Scare from prior craniotomy well healed. Sternal incision dermabonded, c/d/I, well approcximated + trochar surgical sites on L upper abdomen and bilateral lower abdomen; not completely approximated but without erythema or drainage. Chronic appearing skin discolorations on bilateral lower extremities MSK:      No apparent deformity. Full AROM.        Neurologic exam:  Cognition: AAO to person, place, time and event.  Language: Fluent, No substitutions  or neoglisms. No dysarthria. Names 3/3 objects correctly.  Memory: Recalls 3/3 objects at 5 minutes. No apparent deficits  Insight: Good  insight into current condition.  Mood: Pleasant affect, appropriate mood.  Sensation: To light touch intact in BL UEs and LEs  Reflexes: 2+ in BL UE and LEs. Negative Hoffman's and babinski signs bilaterally.  CN: 2-12 grossly intact.  Coordination: No apparent tremors.  No ataxia on FTN, HTS bilaterally.  Spasticity: MAS 0 in all extremities.       Strength:                RUE: antigravity and against resistance with proximal testing limited by sternal precautions;  5/5 WE, 5/5 FF, 5/5 FA                LUE: antigravity and against resistance with proximal testing limited by sternal precautions;  5/5 WE, 5/5 FF, 5/5 FA                RLE: 5/5 HF, 5/5 KE, 5/5  DF, 5/5  EHL, 5/5  PF                 LLE:  5/5 HF, 5/5 KE, 5/5  DF, 5/5  EHL, 5/5  PF      Lab Results Last 24 Hours       Results for orders placed or performed during the hospital encounter of 02/08/23 (from the past 24 hours)  Basic metabolic panel     Status: Abnormal    Collection Time: 03/01/23  3:35 AM  Result Value Ref Range    Sodium 140 135 - 145 mmol/L    Potassium 4.2 3.5 - 5.1 mmol/L    Chloride 108 98 - 111 mmol/L    CO2 22 22 - 32 mmol/L    Glucose, Bld 94 70 - 99 mg/dL    BUN 16 6 - 20 mg/dL    Creatinine, Ser 1.61 (H) 0.61 - 1.24 mg/dL    Calcium 9.2 8.9 - 09.6 mg/dL    GFR, Estimated >04 >54 mL/min    Anion gap 10 5 - 15  Magnesium     Status: None    Collection Time: 03/01/23  3:35 AM  Result Value Ref Range    Magnesium 2.1 1.7 - 2.4 mg/dL      Imaging Results (Last 48 hours)  No results found.     Assessment/Plan: Diagnosis: Debility secondary to AV valve replacement c/b post-op effusion and paroxysmal A fib Does the need for close, 24 hr/day medical supervision in concert with the patient's rehab needs make it unreasonable for this patient to be served in a less intensive setting? Yes Co-Morbidities requiring supervision/potential complications: AKI c/b postop diuresis, constipation, acute blood loss anemia, ongoing paroxysmal atrial fibrillation, and constipation Due to bladder management, bowel management, safety, skin/wound care, disease management, medication administration, pain management, and patient education, does the patient require 24 hr/day rehab  nursing? Yes Does the patient require coordinated care of a physician, rehab nurse, therapy disciplines of PT, OT to address physical and functional deficits in the context of the above medical diagnosis(es)? Yes Addressing deficits in the following areas: balance, endurance, locomotion, strength, transferring, bathing, dressing, feeding, grooming, and toileting Can the patient actively participate in an intensive therapy program of at least 3 hrs of therapy per day at least 5 days per week? Yes The potential for patient to make measurable gains while on inpatient rehab is excellent Anticipated functional outcomes upon  discharge from inpatient rehab are modified independent  with PT, modified independent with OT Estimated rehab length of stay to reach the above functional goals is: 5-7 days Anticipated discharge destination: Home Overall Rehab/Functional Prognosis: excellent   POST ACUTE RECOMMENDATIONS: This patient's condition is appropriate for continued rehabilitative care in the following setting: CIR Patient has agreed to participate in recommended program. Yes Note that insurance prior authorization may be required for reimbursement for recommended care.   Comment: Given ongoing medical need for cardiac monitoring and management postop with paroxysmal atrial fibrillation, as well as AKI and acute blood loss anemia, patient meets medical complexity for inpatient rehab stay. In addition, he is motivated and making gains with therapies but has some limitations due to sternal precautions, post-op weakness, and some carry over deficits due to old TBI. He is independent at baseline and has excellent potential to return home at Mod I level with a short rehab stay for strengthening and reinforcement of precautions.      MEDICAL RECOMMENDATIONS: none     I have personally performed a face to face diagnostic evaluation of this patient. Additionally, I have examined the patient's medical record  including any pertinent labs and radiographic images. If the physician assistant has documented in this note, I have reviewed and edited or otherwise concur with the physician assistant's documentation.   Thanks,   Angelina Sheriff, DO 03/01/2023

## 2023-03-05 NOTE — TOC Transition Note (Signed)
Transition of Care (TOC) - Discharge Note Donn Pierini RN, BSN Transitions of Care Unit 4E- RN Case Manager See Treatment Team for direct phone #   Patient Details  Name: RIKU BUTTERY MRN: [id number] Date of Birth: [birth year]  Transition [facility] CM/SW Contact:  Darrold Span, RN Phone Number: 03/05/2023, 3:48 PM   Clinical Narrative:    CM notified by CIR liaison that pt has insurance auth and bed available for admit to [facility] INPT rehab today.   Pt has been cleared by attending team for transition to INPT rehab- CIR will follow post rehab for any further [facility] and DME needs.   TOC will sign off at this time.     Final next level of care: IP Rehab Facility Barriers to Discharge: Barriers Resolved   Patient Goals and CMS Choice Patient states their goals for this hospitalization and ongoing recovery are:: to return home CMS Medicare.gov Compare Post Acute Care list provided to:: Patient Choice offered to / list presented to : Patient      Discharge Placement               [facility] INPT rehab        Discharge Plan and Services Additional resources added to the After Visit Summary for   In-house Referral: Clinical Social Work Discharge Planning Services: CM Consult Post Acute Care Choice: Home Health, Skilled Nursing Facility          DME Arranged: 3-N-1, Environmental consultant rolling         HH Arranged: RN, PT, OT HH Agency: NA        Social Drivers of Health (SDOH) Interventions SDOH Screenings   Food Insecurity: No Food Insecurity (02/09/2023)  Housing: Low Risk  (02/09/2023)  Transportation Needs: No Transportation Needs (02/09/2023)  Utilities: Not At Risk (02/09/2023)  Depression (PHQ2-9): Low Risk  (05/11/2020)  Tobacco Use: Medium Risk (02/20/2023)     Readmission Risk Interventions    03/05/2023    3:48 PM  Readmission Risk Prevention Plan  Post Dischage Appt Complete  Medication Screening Complete  Transportation Screening Complete

## 2023-03-05 NOTE — H&P (Signed)
Physical Medicine and Rehabilitation Admission H&P    CC: Debility.    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 Laneta Simmers. Post op, he developed hypoxia with ABLA.  Amiodarone added for intra operative A fib and noted to have recurrent episodes with A flutter  v/s SVT. Dr. Girard Cooter consulted and recommended IV amiodarone bolus with slow taper and changing lopressor to bisoprolol w/ K>4 and Mg>2.0.  He continued to have tachycardia with burst of PAF and repeat echo 1/14 revealed large pericardial effusion. He was taken to OR on 01/15 for pericardial window with placement of left pleural chest tube. EPS recommended DOAC once cleared by surgery and continue amiodarone for 3 weeks post op. AKI resolving and lasix d/c. He has been weaned of oxygen and iron supplement added for ABLA. He continues to be limited by sternal precautions and requires supervision to min assist. He lives alone and was independent and working PTA. CIR recommended due to functional decline.    Review of Systems  Constitutional:  Negative for chills and fever.  HENT:  Positive for tinnitus (slight).   Eyes:  Negative for blurred vision and double vision.  Respiratory:  Negative for cough and hemoptysis.   Cardiovascular:  Negative for palpitations.  Gastrointestinal:  Negative for diarrhea, heartburn and nausea.  Genitourinary:  Negative for dysuria and urgency.  Musculoskeletal:  Negative for back pain and myalgias.  Neurological:  Negative for dizziness, weakness and headaches.  Psychiatric/Behavioral:  The patient is not nervous/anxious and does not have insomnia.     Past Surgical History:  Procedure Laterality Date   AORTIC VALVE REPLACEMENT N/A 02/08/2023   Procedure: AORTIC VALVE REPLACEMENT (AVR) USING INSPIRIS AORTIC VALVE SIZE ;  Surgeon:  Alleen Borne, MD;  Location: [facility] OR;  Service: Open Heart Surgery;  Laterality: N/A;   RIGHT/LEFT HEART CATH AND CORONARY ANGIOGRAPHY N/A 12/10/2022   Procedure: RIGHT/LEFT HEART CATH AND CORONARY ANGIOGRAPHY;  Surgeon: Tonny Bollman, MD;  Location: [facility] INVASIVE CV LAB;  Service: Cardiovascular;  Laterality: N/A;   SUBXYPHOID PERICARDIAL WINDOW N/A 02/20/2023   Procedure: SUBXYPHOID PERICARDIAL WINDOW;  Surgeon: Alleen Borne, MD;  Location: [facility] OR;  Service: Thoracic;  Laterality: N/A;   Surgical plate in head  [id number]   TEE WITHOUT CARDIOVERSION N/A 02/08/2023   Procedure: TRANSESOPHAGEAL ECHOCARDIOGRAM;  Surgeon: Alleen Borne, MD;  Location: [facility] OR;  Service: Open Heart Surgery;  Laterality: N/A;   TEE WITHOUT CARDIOVERSION N/A 02/20/2023   Procedure: TRANSESOPHAGEAL ECHOCARDIOGRAM (TEE);  Surgeon: Alleen Borne, MD;  Location: [facility] OR;  Service: Thoracic;  Laterality: N/A;   TOE INTERNAL FIXATION W/ COMPRESSION SCREW Right     Family History  Problem Relation Age of Onset   Diabetes Father     Social History:  Lives alone and independent PTA. Was workin PTA. He reports that he quit smoking about 8 years ago. His smoking use included cigarettes. He has never used smokeless tobacco. He reports that he does not drink alcohol and does not use drugs.   Allergies: No Known Allergies   Medications Prior to Admission  Medication Sig Dispense Refill   amLODipine (NORVASC) 10 MG tablet Take 1 tablet (10 mg total) by mouth daily. (Patient taking differently: Take 10 mg by mouth in the morning.) 180 tablet 3   Cholecalciferol (VITAMIN D-3) 25  MCG (1000 UT) CAPS Take 1,000 Units by mouth in the morning.     divalproex (DEPAKOTE ER) 500 MG 24 hr tablet TAKE 3 TABLETS BY MOUTH IN THE MORNING AND 3 IN THE EVENING 540 tablet 3   phenytoin (DILANTIN) 100 MG ER capsule Take 2 capsules (200 mg total) by mouth at bedtime. 180 capsule 3   rosuvastatin (CRESTOR) 10 MG tablet Take 10 mg by mouth every  evening.       Home: Home Living Family/patient expects to be discharged to:: Private residence Living Arrangements: Alone Available Help at Discharge: Friend(s), Other (Comment) (uncertain of accuracy of info, pt stated friend [name] and other friends can assist him) Type of Home: House Home Access: Level entry Home Layout: One level Bathroom Shower/Tub: Health visitor: Standard Home Equipment: None   Functional History: Prior Function Prior Level of Function : Independent/Modified Independent, Driving ADLs Comments: Ind  Functional Status:  Mobility: Bed Mobility Overal bed mobility: Needs Assistance Bed Mobility: Supine to Sit Rolling: Supervision Sidelying to sit: Contact guard assist Supine to sit: Min assist, HOB elevated Sit to supine: Min assist Sit to sidelying: Mod assist, HOB elevated General bed mobility comments: pt in recliner Transfers Overall transfer level: Needs assistance Equipment used: Rolling walker (2 wheels) Transfers: Sit to/from Stand Sit to Stand: Contact guard assist Bed to/from chair/wheelchair/BSC transfer type:: Step pivot Step pivot transfers: Contact guard assist General transfer comment: with hands holding heart pillow. Continuing to work on controlled descent Ambulation/Gait Ambulation/Gait assistance: Mod assist, Contact guard assist Gait Distance (Feet): 300 Feet Assistive device: Rolling walker (2 wheels), None Gait Pattern/deviations: Step-through pattern, Trunk flexed, Wide base of support, Decreased stride length General Gait Details: pt able to walk with RW with CGA, L foot abducted, pt reports he thinks he has ambulated like this for years. Pt ambulated 15' without AD and gait becomes very unsteady, needs mod A for safety. Step length decreases to inches. Pt needs to be able to ambulate without RW to return to work, big change from his baseline Gait velocity: 0.25 m/s Gait velocity interpretation: <1.31 ft/sec,  indicative of household ambulator    ADL: ADL Overall ADL's : Needs assistance/impaired Eating/Feeding: Set up, Sitting Eating/Feeding Details (indicate cue type and reason): difficulty problem solving how to use straw in milk carton, set- up assist required Grooming: Set up, Supervision/safety, Standing Upper Body Bathing: Contact guard assist, Cueing for UE precautions, Sitting Lower Body Bathing: Minimal assistance, Sit to/from stand, Cueing for safety, Cueing for compensatory techniques Lower Body Bathing Details (indicate cue type and reason): simulated via LB dressing, MAX A to don socks from bed level Upper Body Dressing : Minimal assistance, Sitting, Cueing for UE precautions, Cueing for compensatory techniques Upper Body Dressing Details (indicate cue type and reason): occasional cues for "staying in the tube" Lower Body Dressing: Minimal assistance, Sit to/from stand Lower Body Dressing Details (indicate cue type and reason): able to bring LE to self with increased effort in recliner Toilet Transfer: Minimal assistance, Regular Toilet, Ambulation Toilet Transfer Details (indicate cue type and reason): simulated chair to bed Toileting- Clothing Manipulation and Hygiene: Maximal assistance, Sit to/from stand, Cueing for compensatory techniques (adhering to sternal precautions) Toileting - Clothing Manipulation Details (indicate cue type and reason): simulated Functional mobility during ADLs: Supervision/safety, Rolling walker (2 wheels) General ADL Comments: Pt on bedpan on entry though no BM. Emphasis on sternal precautions with mobility and initial thoughts on ADL mgmt to simulate home routine. pt declined toilet  transfer or ADLs standing at sink this AM  Cognition: Cognition Overall Cognitive Status: Within Functional Limits for tasks assessed Orientation Level: Oriented X4 Cognition Arousal: Alert Behavior During Therapy: WFL for tasks assessed/performed, Flat affect Overall  Cognitive Status: Within Functional Limits for tasks assessed Area of Impairment: Attention, Memory, Following commands, Safety/judgement, Awareness, Problem solving Orientation Level: Disoriented to, Time Current Attention Level: Sustained, Selective Memory: Decreased short-term memory, Decreased recall of precautions Following Commands: Follows one step commands consistently, Follows one step commands inconsistently, Follows multi-step commands consistently, Follows multi-step commands with increased time Safety/Judgement: Decreased awareness of deficits Awareness: Emergent Problem Solving: Slow processing, Decreased initiation, Requires verbal cues General Comments: pt able to verbalize sternal precautions and demo how to keep them with function   Blood pressure 117/71, pulse 70, temperature 98.2 F (36.8 C), temperature source Oral, resp. rate (!) 23, height 5\' 11"  (1.803 m), weight 94 kg, SpO2 98%. Physical Exam Vitals and nursing note reviewed.  Constitutional:      Appearance: Normal appearance.  HENT:     Head:     Comments: Old crani scar noted over mid scalp Cardiovascular:     Rate and Rhythm: Normal rate and regular rhythm.     Heart sounds: No murmur heard.    No gallop.  Pulmonary:     Effort: Pulmonary effort is normal. No respiratory distress.     Breath sounds: No wheezing.  Abdominal:     General: Bowel sounds are normal. There is no distension.     Palpations: Abdomen is soft.     Tenderness: There is no abdominal tenderness.  Musculoskeletal:        General: Tenderness present. No swelling.  Skin:    General: Skin is warm and dry.     Findings: Bruising present.     Comments: Sternal incision healing nicely  Neurological:     Mental Status: He is alert.     Comments: Alert and oriented x 3. Normal insight and awareness. Intact Memory. Normal language and speech. Cranial nerve exam unremarkable. MMT: BUE 5/5, LE 4/5 prox to 5/5 distally. Sensory exam normal  for light touch and pain in all 4 limbs. No limb ataxia or cerebellar signs. No abnormal tone appreciated.  Marland Kitchen    Psychiatric:     Comments: Generally pleasant but somewhat verbose and disinhibited.      Results for orders placed or performed during the hospital encounter of 02/08/23 (from the past 48 hours)  Basic metabolic panel     Status: Abnormal   Collection Time: 03/04/23  3:05 AM  Result Value Ref Range   Sodium 139 135 - 145 mmol/L   Potassium 4.2 3.5 - 5.1 mmol/L   Chloride 107 98 - 111 mmol/L   CO2 21 (L) 22 - 32 mmol/L   Glucose, Bld 90 70 - 99 mg/dL    Comment: Glucose reference range applies only to samples taken after fasting for at least 8 hours.   BUN 17 6 - 20 mg/dL   Creatinine, Ser 1.61 0.61 - 1.24 mg/dL   Calcium 9.0 8.9 - 09.6 mg/dL   GFR, Estimated >04 >54 mL/min    Comment: (NOTE) Calculated using the CKD-EPI Creatinine Equation (2021)    Anion gap 11 5 - 15    Comment: Performed at [facility], [address], [place], [place] [address]  Magnesium     Status: None   Collection Time: 03/04/23  3:05 AM  Result Value Ref Range  Magnesium 2.0 1.7 - 2.4 mg/dL    Comment: Performed at [facility], [address] [place], [place] [address]   No results found.    Blood pressure 117/71, pulse 70, temperature 98.2 F (36.8 C), temperature source Oral, resp. rate (!) 23, height 5\' 11"  (1.803 m), weight 94 kg, SpO2 98%.  Medical Problem List and Plan: 1. Functional deficits secondary to debility related AS and subsequent AVR  -patient may  shower  -ELOS/Goals: 7-10 days, mod I goals 2.  Antithrombotics: -DVT/anticoagulation:  Pharmaceutical: Lovenox added  -antiplatelet therapy:  ASA 3. Pain Management:  Oxycodone prn for severe pain and ultram prn for moderate pain. 4. Mood/Behavior/Sleep: LCSW to follow for evaluation and support.   -antipsychotic agents: N/A 5. Neuropsych/cognition: This patient is capable of making decisions on his own  behalf. 6. Skin/Wound Care:  Monitor incisions for healing.  7. Fluids/Electrolytes/Nutrition: Strict I/O. Refusing ensure --Will  change to prosource to promote wound healing 8. A fib: Has been tapered to amiodarone 200 mg bid with metoprolol 12.5 mg bid.  Monitor for symptoms with increase in activity  -heart rate controlled and rhythm controlled upon admit 9. H/o Seizure d/o: Continue Dilantin  at bedtime and Depakote bid--last was 15 years ago  --Followed by  [facility]  10. ABLA: On iron supplement.  11. AKI: Baseline Scr-1.15. Has been improving with SCr down to 1.20. 12. Constipation: On Dulcolax tab with Senna but refusing laxative every other day -will simplify to Senna S -2 tabs daily.        Jacquelynn Cree, PA-C 03/05/2023

## 2023-03-05 NOTE — Progress Notes (Signed)
Signed     Expand All Collapse All PMR Admission Coordinator Pre-Admission Assessment   Patient: Jerry [name] is an 58 y.o., male MRN: [id number] DOB: 06-13-1965 Height: 5\' 11"  (180.3 cm) Weight: 97 kg   Insurance Information HMO:     PPO: yes     PCP:      IPA:      80/20:      OTHER:  PRIMARY: [employer] [place]      Policy#: [id number]      Subscriber: Pt CM Name: TBD      Phone#: TBD     Fax#: [phone] Pre-Cert#: [id number] Received insurance authorization on 03/05/23. Pt approved for 10 days beginning 03/05/23-03/14/23     Employer:  Benefits:  Phone #: [phone]     Name:  Dolores Hoose Date: 02/06/2023-02/04/9998 Deductible: $1,000 ($1,000 met) OOP Max: $4,000 ($2,048.19 met) CIR: 90% coverage, 10% co-insurance SNF: 90% coverage, 10% co-insurance Outpatient: 90% coverage, 10% co-insurance Home Health: 90% coverage, 10% co-insurance DME: 90% coverage, 10% co-insurance Providers: in network SECONDARY:       Policy#:      Phone#:      Artist:       Phone#:    The Data processing manager" for patients in Inpatient Rehabilitation Facilities with attached "Privacy Act Statement-Health Care Records" was provided and verbally reviewed with: Patient   Emergency Contact Information Contact Information       Name Relation Home Work Mobile    [place] Friend [phone]   [phone]    Buel Ream     [phone]         Other Contacts   None on File        Current Medical History  Patient Admitting Diagnosis: Aortic Stenosis History of Present Illness: Jerry [name] is a 58 y.o. male with PMHx of  has a past medical history of Aortic stenosis, Head injury, Heart murmur, Hypertension, Mixed hyperlipidemia, Seizure (HCC), Skin cancer, Varicose veins, and Vasculitis (HCC) (11/2014). . They were admitted to [facility] on 02/08/2023 for aortic valve replacement following outpatient cardiac workup revealing severe bicuspid aortic  valve stenosis with cardiac catheterization showing no significant coronary disease.  He underwent bioprosthetic AV valve replacement on 1-3 with Dr. Laneta Simmers, developed postoperative atrial fibrillation and was started on amiodarone day of surgery with good rate control initially, developed SVT versus a flutter 1-13 with additional amiodarone bolus needed, EP consulted and suggested medication management with amiodarone and bisoprolol.  On 1-15, he underwent pericardial window due to echocardiogram showing large circumferential pericardial effusion.  Hospitalization was otherwise complicated by AKI due to postop diuresis, constipation, acute blood loss anemia, ongoing paroxysmal atrial fibrillation, and pre-existing seizure disorder.  Pt. Was seen by PT/OT and they recommended CIR to assist return to PLOF.   Patient's medical record from [facility] has been reviewed by the rehabilitation admission coordinator and physician.   Past Medical History      Past Medical History:  Diagnosis Date   Aortic stenosis     Head injury      Subdural hematoma after a traumatic brain injury in 1984   Heart murmur     Hypertension     Mixed hyperlipidemia     Seizure (HCC)     Skin cancer     Varicose veins     Vasculitis (HCC) 11/2014          Has the patient had major surgery during 100 days prior to  admission? Yes   Family History   family history includes Diabetes in his father.   Current Medications  Current Medications    Current Facility-Administered Medications:    amiodarone (PACERONE) tablet 200 mg, 200 mg, Oral, BID, Roddenberry, Myron G, PA-C, 200 mg at 03/04/23 4098   aspirin EC tablet 325 mg, 325 mg, Oral, Daily, 325 mg at 03/04/23 1191 **OR** aspirin chewable tablet 324 mg, 324 mg, Per Tube, Daily, Knute Neu, RPH   bisacodyl (DULCOLAX) EC tablet 10 mg, 10 mg, Oral, Daily, Alleen Borne, MD, 10 mg at 03/03/23 4782   Chlorhexidine Gluconate Cloth 2 % PADS 6 each,  6 each, Topical, Daily, Alleen Borne, MD, 6 each at 03/03/23 0940   divalproex (DEPAKOTE ER) 24 hr tablet 1,500 mg, 1,500 mg, Oral, BID, Knute Neu, RPH, 1,500 mg at 03/04/23 9562   Fe Fum-Vit C-Vit B12-FA (TRIGELS-F FORTE) capsule 1 capsule, 1 capsule, Oral, BID, Knute Neu, RPH, 1 capsule at 03/04/23 1308   feeding supplement (ENSURE ENLIVE / ENSURE PLUS) liquid 237 mL, 237 mL, Oral, BID BM, Alleen Borne, MD, 237 mL at 03/02/23 1500   lactulose (CHRONULAC) 10 GM/15ML solution 20 g, 20 g, Oral, Daily PRN, Roddenberry, Myron G, PA-C, 20 g at 03/03/23 0939   metoprolol tartrate (LOPRESSOR) tablet 12.5 mg, 12.5 mg, Oral, BID, Alleen Borne, MD, 12.5 mg at 03/04/23 0821   ondansetron (ZOFRAN) injection 4 mg, 4 mg, Intravenous, Q6H PRN, Alleen Borne, MD   Oral care mouth rinse, 15 mL, Mouth Rinse, PRN, Loreli Slot, MD   oxyCODONE (Oxy IR/ROXICODONE) immediate release tablet 5-10 mg, 5-10 mg, Oral, Q3H PRN, Alleen Borne, MD, 5 mg at 03/01/23 2156   phenytoin (DILANTIN) ER capsule 200 mg, 200 mg, Oral, QHS, Bartle, Payton Doughty, MD, 200 mg at 03/03/23 2136   rosuvastatin (CRESTOR) tablet 10 mg, 10 mg, Oral, QPM, Alleen Borne, MD, 10 mg at 03/03/23 1721   senna-docusate (Senokot-S) tablet 1 tablet, 1 tablet, Oral, QHS, Alleen Borne, MD, 1 tablet at 03/03/23 2136   traMADol (ULTRAM) tablet 50 mg, 50 mg, Oral, Q6H PRN, Alleen Borne, MD, 50 mg at 03/03/23 2334     Patients Current Diet:  Diet Order                  Diet heart healthy/carb modified Room service appropriate? Yes; Fluid consistency: Thin  Diet effective now                         Precautions / Restrictions Precautions Precautions: Fall, Sternal Precaution Booklet Issued: No Precaution Comments: reviewed precautions during mobility, pt able to verbalize Restrictions Weight Bearing Restrictions Per Provider Order: Yes RUE Weight Bearing Per Provider Order: Non weight bearing LUE Weight Bearing  Per Provider Order: Non weight bearing Other Position/Activity Restrictions: sternal precautions    Has the patient had 2 or more falls or a fall with injury in the past year? No   Prior Activity Level Community (5-7x/wk): Pt active in the community PTA   Prior Functional Level Self Care: Did the patient need help bathing, dressing, using the toilet or eating? Independent   Indoor Mobility: Did the patient need assistance with walking from room to room (with or without device)? Independent   Stairs: Did the patient need assistance with internal or external stairs (with or without device)? Independent   Functional Cognition: Did the patient need help planning  regular tasks such as shopping or remembering to take medications? Independent   Patient Information Are you of Hispanic, Latino/a,or Spanish origin?: A. No, not of Hispanic, Latino/a, or Spanish origin What is your race?: A. White Do you need or want an interpreter to communicate with a doctor or health care staff?: 0. No   Patient's Response To:  Health Literacy and Transportation Is the patient able to respond to health literacy and transportation needs?: Yes Health Literacy - How often do you need to have someone help you when you read instructions, pamphlets, or other written material from your doctor or pharmacy?: Never In the past 12 months, has lack of transportation kept you from medical appointments or from getting medications?: No In the past 12 months, has lack of transportation kept you from meetings, work, or from getting things needed for daily living?: No   Home Assistive Devices / Equipment Home Equipment: None   Prior Device Use: Indicate devices/aids used by the patient prior to current illness, exacerbation or injury? None of the above   Current Functional Level Cognition   Overall Cognitive Status: Within Functional Limits for tasks assessed Current Attention Level: Sustained, Selective Orientation  Level: Oriented X4 Following Commands: Follows one step commands consistently, Follows one step commands inconsistently, Follows multi-step commands consistently, Follows multi-step commands with increased time Safety/Judgement: Decreased awareness of deficits General Comments: pt able to verbalize sternal precautions and demo how to keep them with function    Extremity Assessment (includes Sensation/Coordination)   Upper Extremity Assessment: Overall WFL for tasks assessed  Lower Extremity Assessment: Defer to PT evaluation     ADLs   Overall ADL's : Needs assistance/impaired Eating/Feeding: Set up, Sitting Eating/Feeding Details (indicate cue type and reason): difficulty problem solving how to use straw in milk carton, set- up assist required Grooming: Set up, Supervision/safety, Standing Upper Body Bathing: Contact guard assist, Cueing for UE precautions, Sitting Lower Body Bathing: Minimal assistance, Sit to/from stand, Cueing for safety, Cueing for compensatory techniques Lower Body Bathing Details (indicate cue type and reason): simulated via LB dressing, MAX A to don socks from bed level Upper Body Dressing : Minimal assistance, Sitting, Cueing for UE precautions, Cueing for compensatory techniques Upper Body Dressing Details (indicate cue type and reason): occasional cues for "staying in the tube" Lower Body Dressing: Minimal assistance, Sit to/from stand Lower Body Dressing Details (indicate cue type and reason): able to bring LE to self with increased effort in recliner Toilet Transfer: Minimal assistance, Regular Toilet, Ambulation Toilet Transfer Details (indicate cue type and reason): simulated chair to bed Toileting- Clothing Manipulation and Hygiene: Maximal assistance, Sit to/from stand, Cueing for compensatory techniques (adhering to sternal precautions) Toileting - Clothing Manipulation Details (indicate cue type and reason): simulated Functional mobility during ADLs:  Supervision/safety, Rolling walker (2 wheels) General ADL Comments: Pt on bedpan on entry though no BM. Emphasis on sternal precautions with mobility and initial thoughts on ADL mgmt to simulate home routine. pt declined toilet transfer or ADLs standing at sink this AM     Mobility   Overal bed mobility: Needs Assistance Bed Mobility: Supine to Sit Rolling: Supervision Sidelying to sit: Contact guard assist Supine to sit: Min assist, HOB elevated Sit to supine: Min assist Sit to sidelying: Mod assist, HOB elevated General bed mobility comments: pt in recliner     Transfers   Overall transfer level: Needs assistance Equipment used: Rolling walker (2 wheels) Transfers: Sit to/from Stand Sit to Stand: Contact guard assist Bed to/from  chair/wheelchair/BSC transfer type:: Step pivot Step pivot transfers: Contact guard assist General transfer comment: with hands holding heart pillow. Continuing to work on controlled descent     Ambulation / Gait / Stairs / Psychologist, prison and probation services   Ambulation/Gait Ambulation/Gait assistance: Mod assist, Contact guard assist Gait Distance (Feet): 300 Feet Assistive device: Rolling walker (2 wheels), None Gait Pattern/deviations: Step-through pattern, Trunk flexed, Wide base of support, Decreased stride length General Gait Details: pt able to walk with RW with CGA, L foot abducted, pt reports he thinks he has ambulated like this for years. Pt ambulated 15' without AD and gait becomes very unsteady, needs mod A for safety. Step length decreases to inches. Pt needs to be able to ambulate without RW to return to work, big change from his baseline Gait velocity: 0.25 m/s Gait velocity interpretation: <1.31 ft/sec, indicative of household ambulator     Posture / Balance Balance Overall balance assessment: Needs assistance Sitting-balance support: Feet supported Sitting balance-Leahy Scale: Good Standing balance support: Reliant on assistive device for  balance Standing balance-Leahy Scale: Fair Standing balance comment: no AD for static standing but RW for confidence/energy conservation for hallway mobility High Level Balance Comments: worked on standing with eyes closed, needed min to mod A as well as SL stance     Special needs/care consideration Skin Ecchymosis: chest/left, right; Surgical incision: chest and External Urinary Catheter    Previous Home Environment (from acute therapy documentation) Living Arrangements: Alone Available Help at Discharge: Friend(s), Other (Comment) (uncertain of accuracy of info, pt stated friend [name] and other friends can assist him) Type of Home: House Home Layout: One level Home Access: Level entry Bathroom Shower/Tub: Health visitor: Standard Home Care Services: No   Discharge Living Setting Plans for Discharge Living Setting: Patient's home Type of Home at Discharge: House Discharge Home Layout: One level Discharge Home Access: Level entry Discharge Bathroom Shower/Tub: Walk-in shower Discharge Bathroom Toilet: Standard Discharge Bathroom Accessibility: No Does the patient have any problems obtaining your medications?: No   Social/Family/Support Systems Patient Roles: Other (Comment) Contact Information: Pt. has mod I goals, to return home alone, Friend will pick him up. Caregiver Availability: 24/7 Discharge Plan Discussed with Primary Caregiver: Yes Is Caregiver In Agreement with Plan?: Yes   Goals Patient/Family Goal for Rehab: PT/OT Mod I Expected length of stay: 7-10 days Pt/Family Agrees to Admission and willing to participate: Yes Program Orientation Provided & Reviewed with Pt/Caregiver Including Roles  & Responsibilities: Yes   Decrease burden of Care through IP rehab admission: not anticipated   Possible need for SNF placement upon discharge: not anticipated   Patient Condition: I have reviewed medical records from [facility], spoken with  CM, and patient. I met with patient at the bedside for inpatient rehabilitation assessment.  Patient will benefit from ongoing PT and OT, can actively participate in 3 hours of therapy a day 5 days of the week, and can make measurable gains during the admission.  Patient will also benefit from the coordinated team approach during an Inpatient Acute Rehabilitation admission.  The patient will receive intensive therapy as well as Rehabilitation physician, nursing, social worker, and care management interventions.  Due to safety, skin/wound care, disease management, medication administration, pain management, and patient education the patient requires 24 hour a day rehabilitation nursing.  The patient is currently Contact Guard A-Mod A with mobility and Contact Guard A-Min A with basic ADLs.  Discharge setting and therapy post discharge at home with home  health is anticipated.  Patient has agreed to participate in the Acute Inpatient Rehabilitation Program and will admit today.   Preadmission Screen Completed By:  Jeronimo Greaves, with updates by Wolfgang Phoenix 03/04/2023 2:31 PM ______________________________________________________________________   Discussed status with Dr. Riley Kill on 03/05/23  at 2:07 PM and received approval for admission today.   Admission Coordinator:  Jeronimo Greaves, CCC-SLP, time 2:07 PM/Date 03/05/23     Assessment/Plan: Diagnosis: Debility after AVR for AS Does the need for close, 24 hr/day Medical supervision in concert with the patient's rehab needs make it unreasonable for this patient to be served in a less intensive setting? Yes Co-Morbidities requiring supervision/potential complications: old TBI, htn, sz disorder Due to bladder management, bowel management, safety, skin/wound care, disease management, medication administration, pain management, and patient education, does the patient require 24 hr/day rehab nursing? Yes Does the patient require coordinated care of a  physician, rehab nurse, PT, OT to address physical and functional deficits in the context of the above medical diagnosis(es)? Yes Addressing deficits in the following areas: balance, endurance, locomotion, strength, transferring, bowel/bladder control, bathing, dressing, feeding, grooming, toileting, and psychosocial support Can the patient actively participate in an intensive therapy program of at least 3 hrs of therapy 5 days a week? Yes The potential for patient to make measurable gains while on inpatient rehab is excellent Anticipated functional outcomes upon discharge from inpatient rehab: modified independent PT, modified independent OT, n/a SLP Estimated rehab length of stay to reach the above functional goals is: 7-10 days Anticipated discharge destination: Home 10. Overall Rehab/Functional Prognosis: excellent     MD Signature: Ranelle Oyster, MD, [facility] Physical Medicine & Rehabilitation Medical Director Rehabilitation Services 03/05/2023

## 2023-03-05 NOTE — H&P (Signed)
Physical Medicine and Rehabilitation Admission H&P     CC: Debility.      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 Laneta Simmers. Post op, he developed hypoxia with ABLA.  Amiodarone added for intra operative A fib and noted to have recurrent episodes with A flutter  v/s SVT. Dr. Girard Cooter consulted and recommended IV amiodarone bolus with slow taper and changing lopressor to bisoprolol w/ K>4 and Mg>2.0.  He continued to have tachycardia with burst of PAF and repeat echo 1/14 revealed large pericardial effusion. He was taken to OR on 01/15 for pericardial window with placement of left pleural chest tube. EPS recommended DOAC once cleared by surgery and continue amiodarone for 3 weeks post op. AKI resolving and lasix d/c. He has been weaned of oxygen and iron supplement added for ABLA. He continues to be limited by sternal precautions and requires supervision to min assist. He lives alone and was independent and working PTA. CIR recommended due to functional decline.      Review of Systems  Constitutional:  Negative for chills and fever.  HENT:  Positive for tinnitus (slight).   Eyes:  Negative for blurred vision and double vision.  Respiratory:  Negative for cough and hemoptysis.   Cardiovascular:  Negative for palpitations.  Gastrointestinal:  Negative for diarrhea, heartburn and nausea.  Genitourinary:  Negative for dysuria and urgency.  Musculoskeletal:  Negative for back pain and myalgias.  Neurological:  Negative for dizziness, weakness and headaches.  Psychiatric/Behavioral:  The patient is not nervous/anxious and does not have insomnia.            Past Surgical History:  Procedure Laterality Date   AORTIC VALVE REPLACEMENT N/A 02/08/2023    Procedure: AORTIC VALVE REPLACEMENT (AVR) USING INSPIRIS AORTIC VALVE SIZE ;   Surgeon: Alleen Borne, MD;  Location: [facility] OR;  Service: Open Heart Surgery;  Laterality: N/A;   RIGHT/LEFT HEART CATH AND CORONARY ANGIOGRAPHY N/A 12/10/2022    Procedure: RIGHT/LEFT HEART CATH AND CORONARY ANGIOGRAPHY;  Surgeon: Tonny Bollman, MD;  Location: [facility] INVASIVE CV LAB;  Service: Cardiovascular;  Laterality: N/A;   SUBXYPHOID PERICARDIAL WINDOW N/A 02/20/2023    Procedure: SUBXYPHOID PERICARDIAL WINDOW;  Surgeon: Alleen Borne, MD;  Location: [facility] OR;  Service: Thoracic;  Laterality: N/A;   Surgical plate in head   1984   TEE WITHOUT CARDIOVERSION N/A 02/08/2023    Procedure: TRANSESOPHAGEAL ECHOCARDIOGRAM;  Surgeon: Alleen Borne, MD;  Location: [facility] OR;  Service: Open Heart Surgery;  Laterality: N/A;   TEE WITHOUT CARDIOVERSION N/A 02/20/2023    Procedure: TRANSESOPHAGEAL ECHOCARDIOGRAM (TEE);  Surgeon: Alleen Borne, MD;  Location: [facility] OR;  Service: Thoracic;  Laterality: N/A;   TOE INTERNAL FIXATION W/ COMPRESSION SCREW Right                 Family History  Problem Relation Age of Onset   Diabetes Father            Social History:  Lives alone and independent PTA. Was workin PTA. He reports that he quit smoking about 8 years ago. His smoking use included cigarettes. He has never used smokeless tobacco. He reports that he does not drink alcohol and does not use drugs.     Allergies:  Allergies  No Known Allergies  Medications Prior to Admission  Medication Sig Dispense Refill   amLODipine (NORVASC) 10 MG tablet Take 1 tablet (10 mg total) by mouth daily. (Patient taking differently: Take 10 mg by mouth in the morning.) 180 tablet 3   Cholecalciferol (VITAMIN D-3) 25 MCG (1000 UT) CAPS Take 1,000 Units by mouth in the morning.       divalproex (DEPAKOTE ER) 500 MG 24 hr tablet TAKE 3 TABLETS BY MOUTH IN THE MORNING AND 3 IN THE EVENING 540 tablet 3   phenytoin (DILANTIN) 100 MG ER capsule Take 2 capsules (200 mg total) by mouth at bedtime. 180 capsule 3    rosuvastatin (CRESTOR) 10 MG tablet Take 10 mg by mouth every evening.                Home: Home Living Family/patient expects to be discharged to:: Private residence Living Arrangements: Alone Available Help at Discharge: Friend(s), Other (Comment) (uncertain of accuracy of info, pt stated friend [name] and other friends can assist him) Type of Home: House Home Access: Level entry Home Layout: One level Bathroom Shower/Tub: Health visitor: Standard Home Equipment: None   Functional History: Prior Function Prior Level of Function : Independent/Modified Independent, Driving ADLs Comments: Ind   Functional Status:  Mobility: Bed Mobility Overal bed mobility: Needs Assistance Bed Mobility: Supine to Sit Rolling: Supervision Sidelying to sit: Contact guard assist Supine to sit: Min assist, HOB elevated Sit to supine: Min assist Sit to sidelying: Mod assist, HOB elevated General bed mobility comments: pt in recliner Transfers Overall transfer level: Needs assistance Equipment used: Rolling walker (2 wheels) Transfers: Sit to/from Stand Sit to Stand: Contact guard assist Bed to/from chair/wheelchair/BSC transfer type:: Step pivot Step pivot transfers: Contact guard assist General transfer comment: with hands holding heart pillow. Continuing to work on controlled descent Ambulation/Gait Ambulation/Gait assistance: Mod assist, Contact guard assist Gait Distance (Feet): 300 Feet Assistive device: Rolling walker (2 wheels), None Gait Pattern/deviations: Step-through pattern, Trunk flexed, Wide base of support, Decreased stride length General Gait Details: pt able to walk with RW with CGA, L foot abducted, pt reports he thinks he has ambulated like this for years. Pt ambulated 15' without AD and gait becomes very unsteady, needs mod A for safety. Step length decreases to inches. Pt needs to be able to ambulate without RW to return to work, big change from his  baseline Gait velocity: 0.25 m/s Gait velocity interpretation: <1.31 ft/sec, indicative of household ambulator   ADL: ADL Overall ADL's : Needs assistance/impaired Eating/Feeding: Set up, Sitting Eating/Feeding Details (indicate cue type and reason): difficulty problem solving how to use straw in milk carton, set- up assist required Grooming: Set up, Supervision/safety, Standing Upper Body Bathing: Contact guard assist, Cueing for UE precautions, Sitting Lower Body Bathing: Minimal assistance, Sit to/from stand, Cueing for safety, Cueing for compensatory techniques Lower Body Bathing Details (indicate cue type and reason): simulated via LB dressing, MAX A to don socks from bed level Upper Body Dressing : Minimal assistance, Sitting, Cueing for UE precautions, Cueing for compensatory techniques Upper Body Dressing Details (indicate cue type and reason): occasional cues for "staying in the tube" Lower Body Dressing: Minimal assistance, Sit to/from stand Lower Body Dressing Details (indicate cue type and reason): able to bring LE to self with increased effort in recliner Toilet Transfer: Minimal assistance, Regular Toilet, Ambulation Toilet Transfer Details (indicate cue type and reason): simulated chair to bed Toileting- Clothing Manipulation and Hygiene: Maximal assistance, Sit to/from stand, Cueing for  compensatory techniques (adhering to sternal precautions) Toileting - Clothing Manipulation Details (indicate cue type and reason): simulated Functional mobility during ADLs: Supervision/safety, Rolling walker (2 wheels) General ADL Comments: Pt on bedpan on entry though no BM. Emphasis on sternal precautions with mobility and initial thoughts on ADL mgmt to simulate home routine. pt declined toilet transfer or ADLs standing at sink this AM   Cognition: Cognition Overall Cognitive Status: Within Functional Limits for tasks assessed Orientation Level: Oriented X4 Cognition Arousal:  Alert Behavior During Therapy: WFL for tasks assessed/performed, Flat affect Overall Cognitive Status: Within Functional Limits for tasks assessed Area of Impairment: Attention, Memory, Following commands, Safety/judgement, Awareness, Problem solving Orientation Level: Disoriented to, Time Current Attention Level: Sustained, Selective Memory: Decreased short-term memory, Decreased recall of precautions Following Commands: Follows one step commands consistently, Follows one step commands inconsistently, Follows multi-step commands consistently, Follows multi-step commands with increased time Safety/Judgement: Decreased awareness of deficits Awareness: Emergent Problem Solving: Slow processing, Decreased initiation, Requires verbal cues General Comments: pt able to verbalize sternal precautions and demo how to keep them with function     Blood pressure 117/71, pulse 70, temperature 98.2 F (36.8 C), temperature source Oral, resp. rate (!) 23, height 5\' 11"  (1.803 m), weight 94 kg, SpO2 98%. Physical Exam Vitals and nursing note reviewed.  Constitutional:      Appearance: Normal appearance.  HENT:     Head:     Comments: Old crani scar noted over mid scalp Cardiovascular:     Rate and Rhythm: Normal rate and regular rhythm.     Heart sounds: No murmur heard.    No gallop.  Pulmonary:     Effort: Pulmonary effort is normal. No respiratory distress.     Breath sounds: No wheezing.  Abdominal:     General: Bowel sounds are normal. There is no distension.     Palpations: Abdomen is soft.     Tenderness: There is no abdominal tenderness.  Musculoskeletal:        General: Tenderness present. No swelling.  Skin:    General: Skin is warm and dry.     Findings: Bruising present.     Comments: Sternal, chest incisions healing nicely  Neurological:     Mental Status: He is alert.     Comments: Alert and oriented x 3. Normal insight and awareness. Intact Memory. Normal language and speech.  Cranial nerve exam unremarkable. MMT: BUE 5/5, LE 4/5 prox to 5/5 distally. Sensory exam normal for light touch and pain in all 4 limbs. No limb ataxia or cerebellar signs. No abnormal tone appreciated.  Marland Kitchen    Psychiatric:     Comments: Generally pleasant but somewhat verbose and disinhibited.         Lab Results Last 48 Hours        Results for orders placed or performed during the hospital encounter of 02/08/23 (from the past 48 hours)  Basic metabolic panel     Status: Abnormal    Collection Time: 03/04/23  3:05 AM  Result Value Ref Range    Sodium 139 135 - 145 mmol/L    Potassium 4.2 3.5 - 5.1 mmol/L    Chloride 107 98 - 111 mmol/L    CO2 21 (L) 22 - 32 mmol/L    Glucose, Bld 90 70 - 99 mg/dL      Comment: Glucose reference range applies only to samples taken after fasting for at least 8 hours.    BUN 17 6 - 20 mg/dL  Creatinine, Ser 1.20 0.61 - 1.24 mg/dL    Calcium 9.0 8.9 - 16.1 mg/dL    GFR, Estimated >09 >60 mL/min      Comment: (NOTE) Calculated using the CKD-EPI Creatinine Equation (2021)      Anion gap 11 5 - 15      Comment: Performed at [facility], [address], [place], [place] [address]  Magnesium     Status: None    Collection Time: 03/04/23  3:05 AM  Result Value Ref Range    Magnesium 2.0 1.7 - 2.4 mg/dL      Comment: Performed at [facility], [address], [place], [place] [address]      Imaging Results (Last 48 hours)  No results found.         Blood pressure 117/71, pulse 70, temperature 98.2 F (36.8 C), temperature source Oral, resp. rate (!) 23, height 5\' 11"  (1.803 m), weight 94 kg, SpO2 98%.   Medical Problem List and Plan: 1. Functional deficits secondary to debility related AS and subsequent AVR, ultimately pericardial window for large pericardial effusion             -patient may  shower             -ELOS/Goals: 7-10 days, mod I goals  -sternal precautions 2.  Antithrombotics: -DVT/anticoagulation:  Pharmaceutical:  Lovenox added             -antiplatelet therapy:  ASA 3. Pain Management:  Oxycodone prn for severe pain and ultram prn for moderate pain. 4. Mood/Behavior/Sleep: LCSW to follow for evaluation and support.              -antipsychotic agents: N/A 5. Neuropsych/cognition: This patient is capable of making decisions on his own behalf. 6. Skin/Wound Care:  Monitor incisions for healing.  7. Fluids/Electrolytes/Nutrition: Strict I/O. Refusing ensure --Will  change to prosource to promote wound healing 8. A fib: Has been tapered to amiodarone 200 mg bid with metoprolol 12.5 mg bid.  Monitor for symptoms with increase in activity             -heart rate controlled and rhythm controlled upon admit 9. H/o Seizure d/o: Continue Dilantin  at bedtime and Depakote bid--last was 15 years ago             --Followed by  [facility]  10. ABLA: On iron supplement.  11. AKI: Baseline Scr-1.15. Has been improving with SCr down to 1.20. 12. Constipation: On Dulcolax tab with Senna but refusing laxative every other day -will simplify to Senna S -2 tabs daily.       Jacquelynn Cree, PA-C 03/05/2023  I have personally performed a face to face diagnostic evaluation of this patient and formulated the key components of the plan.  Additionally, I have personally reviewed laboratory data, imaging studies, as well as relevant notes and concur with the physician assistant's documentation above.  The patient's status has not changed from the original H&P.  Any changes in documentation from the acute care chart have been noted above.  Ranelle Oyster, MD, [place] [facility]

## 2023-03-05 NOTE — Progress Notes (Addendum)
Inpatient Rehab Admissions Coordinator:  Received insurance approval. Await bed availability.  There is a bed available in CIR for pt today. Jillyn Hidden, PA-C aware and in agreement. Pt, NSG,and TOC made aware.   Wolfgang Phoenix, MS, CCC-SLP Admissions Coordinator [phone]

## 2023-03-06 DIAGNOSIS — R5381 Other malaise: Secondary | ICD-10-CM | POA: Diagnosis not present

## 2023-03-06 LAB — CBC WITH DIFFERENTIAL/PLATELET
Abs Immature Granulocytes: 0.09 10*3/uL — ABNORMAL HIGH (ref 0.00–0.07)
Basophils Absolute: 0 10*3/uL (ref 0.0–0.1)
Basophils Relative: 0 %
Eosinophils Absolute: 0.2 10*3/uL (ref 0.0–0.5)
Eosinophils Relative: 2 %
HCT: 36.5 % — ABNORMAL LOW (ref 39.0–52.0)
Hemoglobin: 11.6 g/dL — ABNORMAL LOW (ref 13.0–17.0)
Immature Granulocytes: 1 %
Lymphocytes Relative: 20 %
Lymphs Abs: 1.9 10*3/uL (ref 0.7–4.0)
MCH: 33.1 pg (ref 26.0–34.0)
MCHC: 31.8 g/dL (ref 30.0–36.0)
MCV: 104.3 fL — ABNORMAL HIGH (ref 80.0–100.0)
Monocytes Absolute: 0.8 10*3/uL (ref 0.1–1.0)
Monocytes Relative: 8 %
Neutro Abs: 6.3 10*3/uL (ref 1.7–7.7)
Neutrophils Relative %: 69 %
Platelets: 263 10*3/uL (ref 150–400)
RBC: 3.5 MIL/uL — ABNORMAL LOW (ref 4.22–5.81)
RDW: 14.8 % (ref 11.5–15.5)
WBC: 9.2 10*3/uL (ref 4.0–10.5)
nRBC: 0 % (ref 0.0–0.2)

## 2023-03-06 LAB — COMPREHENSIVE METABOLIC PANEL
ALT: 20 U/L (ref 0–44)
AST: 21 U/L (ref 15–41)
Albumin: 3 g/dL — ABNORMAL LOW (ref 3.5–5.0)
Alkaline Phosphatase: 89 U/L (ref 38–126)
Anion gap: 9 (ref 5–15)
BUN: 15 mg/dL (ref 6–20)
CO2: 22 mmol/L (ref 22–32)
Calcium: 8.9 mg/dL (ref 8.9–10.3)
Chloride: 109 mmol/L (ref 98–111)
Creatinine, Ser: 1.25 mg/dL — ABNORMAL HIGH (ref 0.61–1.24)
GFR, Estimated: >60 mL/min (ref >60)
Glucose, Bld: 94 mg/dL (ref 70–99)
Potassium: 4.3 mmol/L (ref 3.5–5.1)
Sodium: 140 mmol/L (ref 135–145)
Total Bilirubin: 0.5 mg/dL (ref 0.0–1.2)
Total Protein: 6 g/dL — ABNORMAL LOW (ref 6.5–8.1)

## 2023-03-06 LAB — GLUCOSE, CAPILLARY
Glucose-Capillary: 101 mg/dL — ABNORMAL HIGH (ref 70–99)
Glucose-Capillary: 89 mg/dL (ref 70–99)
Glucose-Capillary: 73 mg/dL (ref 70–99)

## 2023-03-06 NOTE — Progress Notes (Signed)
Inpatient Rehabilitation  Patient information reviewed and entered into eRehab system by Cheri Rous, OTR/L, Rehab Quality Coordinator.   Information including medical coding, functional ability and quality indicators will be reviewed and updated through discharge.

## 2023-03-06 NOTE — Progress Notes (Signed)
Occupational Therapy Assessment and Plan  Patient Details  Name: STIVEN KASPAR MRN: [id number] Date of Birth: [birth year]  OT Diagnosis: muscle weakness (generalized) Rehab Potential: Rehab Potential (ACUTE ONLY): Excellent ELOS: 7- 10 days   Today's Date: 03/06/2023 OT Individual Time: 1045-1200 OT Individual Time Calculation (min): 75 min     Hospital Problem: Principal Problem:   Debility   Past Medical History:  Past Medical History:  Diagnosis Date   Aortic stenosis    Head injury    Subdural hematoma after a traumatic brain injury in 1984   Heart murmur    Hypertension    Mixed hyperlipidemia    Seizure (HCC)    Skin cancer    Varicose veins    Vasculitis (HCC) 11/2014   Past Surgical History:  Past Surgical History:  Procedure Laterality Date   AORTIC VALVE REPLACEMENT N/A 02/08/2023   Procedure: AORTIC VALVE REPLACEMENT (AVR) USING INSPIRIS AORTIC VALVE SIZE ;  Surgeon: Alleen Borne, MD;  Location: [facility] OR;  Service: Open Heart Surgery;  Laterality: N/A;   RIGHT/LEFT HEART CATH AND CORONARY ANGIOGRAPHY N/A 12/10/2022   Procedure: RIGHT/LEFT HEART CATH AND CORONARY ANGIOGRAPHY;  Surgeon: Tonny Bollman, MD;  Location: [facility] INVASIVE CV LAB;  Service: Cardiovascular;  Laterality: N/A;   SUBXYPHOID PERICARDIAL WINDOW N/A 02/20/2023   Procedure: SUBXYPHOID PERICARDIAL WINDOW;  Surgeon: Alleen Borne, MD;  Location: [facility] OR;  Service: Thoracic;  Laterality: N/A;   Surgical plate in head  [id number]   TEE WITHOUT CARDIOVERSION N/A 02/08/2023   Procedure: TRANSESOPHAGEAL ECHOCARDIOGRAM;  Surgeon: Alleen Borne, MD;  Location: [facility] OR;  Service: Open Heart Surgery;  Laterality: N/A;   TEE WITHOUT CARDIOVERSION N/A 02/20/2023   Procedure: TRANSESOPHAGEAL ECHOCARDIOGRAM (TEE);  Surgeon: Alleen Borne, MD;  Location: [facility] OR;  Service: Thoracic;  Laterality: N/A;   TOE INTERNAL FIXATION W/ COMPRESSION SCREW Right     Assessment & Plan Clinical Impression: Judea Riches is a  58 year old RH-male with history of HTN, 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 Laneta Simmers. Post op, he developed hypoxia with ABLA. Amiodarone added for intra operative A fib and noted to have recurrent episodes with A flutter v/s SVT. Dr. Girard Cooter consulted and recommended IV amiodarone bolus with slow taper and changing lopressor to bisoprolol w/ K>4 and Mg>2.0. He continued to have tachycardia with burst of PAF and repeat echo 1/14 revealed large pericardial effusion. He was taken to OR on 01/15 for pericardial window with placement of left pleural chest tube. EPS recommended DOAC once cleared by surgery and continue amiodarone for 3 weeks post op. AKI resolving and lasix d/c. He has been weaned of oxygen and iron supplement added for ABLA. He continues to be limited by sternal precautions and requires supervision to min assist. He lives alone and was independent and working PTA. CIR recommended due to functional decline. Patient transferred to CIR on 03/05/2023 .    Patient currently requires min with basic self-care skills secondary to muscle weakness, decreased cardiorespiratoy endurance, and decreased standing balance and decreased balance strategies.  Prior to hospitalization, patient could complete ADLS with independent .  Patient will benefit from skilled intervention to increase independence with basic self-care skills prior to discharge home independently.  Anticipate patient will require intermittent supervision and follow up home health.  OT - End of Session Activity Tolerance: Tolerates 10 - 20 min activity with multiple rests  Endurance Deficit: Yes Endurance Deficit Description: generalized weakness OT Assessment Rehab Potential (ACUTE ONLY): Excellent OT Barriers to Discharge: Decreased caregiver support OT Barriers to Discharge Comments: lives alone OT Patient demonstrates impairments  in the following area(s): Balance;Motor;Endurance;Safety OT Basic ADL's Functional Problem(s): Bathing;Dressing;Toileting OT Advanced ADL's Functional Problem(s): Simple Meal Preparation;Light Housekeeping OT Transfers Functional Problem(s): Toilet;Tub/Shower OT Additional Impairment(s): None OT Plan OT Intensity: Minimum of 1-2 x/day, 45 to 90 minutes OT Frequency: 5 out of 7 days OT Duration/Estimated Length of Stay: 7- 10 days OT Treatment/Interventions: Balance/vestibular training;Discharge planning;Self Care/advanced ADL retraining;Pain management;Therapeutic Activities;Patient/family education;Functional mobility training;Therapeutic Exercise;Community reintegration;DME/adaptive equipment instruction;Psychosocial support OT Self Feeding Anticipated Outcome(s): no self feeding goal OT Basic Self-Care Anticipated Outcome(s): mod I OT Toileting Anticipated Outcome(s): mod I OT Bathroom Transfers Anticipated Outcome(s): mod I OT Recommendation Patient destination: Home Follow Up Recommendations: Home health OT Equipment Recommended: Tub/shower seat   OT Evaluation Precautions/Restrictions  Precautions Precautions: Fall;Sternal Precaution Booklet Issued: No Precaution Comments: sternal precautions Restrictions Weight Bearing Restrictions Per Provider Order: No Other Position/Activity Restrictions: sternal precautions General Chart Reviewed: Yes Family/Caregiver Present: No  Home Living/Prior Functioning Home Living Family/patient expects to be discharged to:: Private residence Living Arrangements: Alone Available Help at Discharge: Friend(s) (reports friend, uncle can come check in) Type of Home: House Home Access: Level entry Home Layout: One level Bathroom Shower/Tub: Health visitor: Standard  Lives With: Alone IADL History Homemaking Responsibilities: Yes Meal Prep Responsibility: Primary Laundry Responsibility: Primary Cleaning Responsibility:  Primary Bill Paying/Finance Responsibility: Primary Shopping Responsibility: Primary Current License: Yes Mode of Transportation: Car Occupation: Full time employment Type of Occupation: Engineer, 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 Prior Function Level of Independence: Independent with basic ADLs, Independent with gait, Independent with transfers Vocation: Full time employment Vision Baseline Vision/History: 1 Wears glasses Ability to See in Adequate Light: 0 Adequate Patient Visual Report: No change from baseline Vision Assessment?: No apparent visual deficits Perception  Perception: Within Functional Limits Praxis Praxis: WFL Cognition Cognition Overall Cognitive Status: Within Functional Limits for tasks assessed Arousal/Alertness: Awake/alert Orientation Level: Person;Place;Situation Person: Oriented Place: Oriented Situation: Oriented Memory: Impaired Memory Impairment: Decreased recall of new information Awareness: Appears intact Problem Solving: Appears intact Safety/Judgment: Appears intact Brief Interview for Mental Status (BIMS) Repetition of Three Words (First Attempt): 3 Temporal Orientation: Year: Correct Temporal Orientation: Month: Accurate within 5 days Temporal Orientation: Day: Correct Recall: "Sock": No, could not recall Recall: "Blue": Yes, after cueing ("a color") Recall: "Bed": Yes, no cue required BIMS Summary Score: 12 Sensation Sensation Light Touch: Appears Intact Hot/Cold: Appears Intact Coordination Gross Motor Movements are Fluid and Coordinated: No Fine Motor Movements are Fluid and Coordinated: No Coordination and Movement Description: Limited by generalized weakness Motor  Motor Motor: Within Functional Limits  Trunk/Postural Assessment  Cervical Assessment Cervical Assessment: Exceptions to [facility] (forward head) Thoracic Assessment Thoracic Assessment: Within Functional Limits Lumbar Assessment Lumbar Assessment: Within Functional  Limits Postural Control Postural Control: Deficits on evaluation  Balance Balance Balance Assessed: Yes Static Sitting Balance Static Sitting - Balance Support: Feet supported Static Sitting - Level of Assistance: 7: Independent Dynamic Sitting Balance Dynamic Sitting - Balance Support: Feet supported Dynamic Sitting - Level of Assistance: 6: Modified independent (Device/Increase time) Static Standing Balance Static Standing - Balance Support: During functional activity Static Standing - Level of Assistance: 4: Min assist Dynamic Standing Balance Dynamic Standing - Balance Support: During functional activity Dynamic Standing - Level of Assistance: 4: Min assist Extremity/Trunk Assessment RUE Assessment RUE Assessment: Exceptions to  WFL General Strength Comments: generalized weakness, no MMT 2/2 sternal precautions LUE Assessment LUE Assessment: Exceptions to [facility] General Strength Comments: generalized weakness, no MMT 2/2 sternal precautions  Care Tool Care Tool Self Care Eating   Eating Assist Level: Independent    Oral Care    Oral Care Assist Level: Supervision/Verbal cueing    Bathing   Body parts bathed by patient: Right arm;Left upper leg;Left arm;Right lower leg;Abdomen;Left lower leg;Front perineal area;Face;Buttocks;Right upper leg;Chest     Assist Level: Minimal Assistance - Patient > 75%    Upper Body Dressing(including orthotics)   What is the patient wearing?: Pull over shirt   Assist Level: Minimal Assistance - Patient > 75%    Lower Body Dressing (excluding footwear)   What is the patient wearing?: Underwear/pull up;Pants Assist for lower body dressing: Minimal Assistance - Patient > 75%    Putting on/Taking off footwear   What is the patient wearing?: Non-skid slipper socks Assist for footwear: Moderate Assistance - Patient 50 - 74%       Care Tool Toileting Toileting activity   Assist for toileting: Minimal Assistance - Patient > 75%     Care  Tool Bed Mobility Roll left and right activity   Roll left and right assist level: Minimal Assistance - Patient > 75%    Sit to lying activity   Sit to lying assist level: Minimal Assistance - Patient > 75%    Lying to sitting on side of bed activity   Lying to sitting on side of bed assist level: the ability to move from lying on the back to sitting on the side of the bed with no back support.: Minimal Assistance - Patient > 75%     Care Tool Transfers Sit to stand transfer   Sit to stand assist level: Minimal Assistance - Patient > 75%    Chair/bed transfer   Chair/bed transfer assist level: Minimal Assistance - Patient > 75%     Toilet transfer   Assist Level: Minimal Assistance - Patient > 75%     Care Tool Cognition  Expression of Ideas and Wants Expression of Ideas and Wants: 4. Without difficulty (complex and basic) - expresses complex messages without difficulty and with speech that is clear and easy to understand  Understanding Verbal and Non-Verbal Content Understanding Verbal and Non-Verbal Content: 4. Understands (complex and basic) - clear comprehension without cues or repetitions   Memory/Recall Ability Memory/Recall Ability : Current season;Staff names and faces;That he or she is in a hospital/hospital unit   Refer to Care Plan for Long Term Goals  SHORT TERM GOAL WEEK 1 OT Short Term Goal 1 (Week 1): STG= LTG d/t ELOS  Recommendations for other services: None    Skilled Therapeutic Intervention ADL ADL Eating: Independent Where Assessed-Eating: Chair Grooming: Supervision/safety Where Assessed-Grooming: Sitting at sink Upper Body Bathing: Supervision/safety Where Assessed-Upper Body Bathing: Shower Lower Body Bathing: Minimal assistance Where Assessed-Lower Body Bathing: Shower Upper Body Dressing: Minimal assistance Where Assessed-Upper Body Dressing: Edge of bed Lower Body Dressing: Minimal assistance Where Assessed-Lower Body Dressing: Edge of  bed Toileting: Minimal assistance Where Assessed-Toileting: Teacher, adult education: Curator Method: Event organiser: Insurance underwriter Method: Designer, industrial/product: Event organiser  Bed Mobility Bed Mobility: Supine to Sit;Sit to Supine Supine to Sit: Minimal Assistance - Patient > 75% Sit to Supine: Minimal Assistance - Patient > 75% Transfers Sit to Stand: Minimal Assistance - Patient > 75% Stand  to Sit: Minimal Assistance - Patient > 75%   Skilled OT evaluation completed with the creation of pt centered OT POC. Pt educated on condition, ELOS, rehab expectations, and fall risk reduction strategies throughout session. Pt completed full shower as described above, min A overall using TTB. Discussed DME recommendations for home- likely shower chair. He required several rest breaks. Good carryover of sternal precautions. Min A to don pants and shirt. Standing level oral care with CGA with UE support on the sink. He ended with 50 ft of functional mobility with the RW at [facility] level. Pt was left sitting up in the w/c with all needs met, chair alarm set, and call bell within reach.    Discharge Criteria: Patient will be discharged from OT if patient refuses treatment 3 consecutive times without medical reason, if treatment goals not met, if there is a change in medical status, if patient makes no progress towards goals or if patient is discharged from hospital.  The above assessment, treatment plan, treatment alternatives and goals were discussed and mutually agreed upon: by patient  Crissie Reese 03/06/2023, 11:49 AM

## 2023-03-06 NOTE — Evaluation (Signed)
Physical Therapy Assessment and Plan  Patient Details  Name: Jerry [name] MRN: [id number] Date of Birth: [birth year]  PT Diagnosis: Difficulty walking and Muscle weakness Rehab Potential: Good ELOS: 7-10 Days   Today's Date: 03/06/2023 PT Individual Time: 0865-7846 PT Individual Time Calculation (min): 73 min    Today's Date: 03/06/2023 PT Individual Time: 9629-5284 PT Individual Time Calculation (min): 58 min    Hospital Problem: Principal Problem:   Debility   Past Medical History:  Past Medical History:  Diagnosis Date   Aortic stenosis    Head injury    Subdural hematoma after a traumatic brain injury in 1984   Heart murmur    Hypertension    Mixed hyperlipidemia    Seizure (HCC)    Skin cancer    Varicose veins    Vasculitis (HCC) 11/2014   Past Surgical History:  Past Surgical History:  Procedure Laterality Date   AORTIC VALVE REPLACEMENT N/A 02/08/2023   Procedure: AORTIC VALVE REPLACEMENT (AVR) USING INSPIRIS AORTIC VALVE SIZE ;  Surgeon: Alleen Borne, MD;  Location: [facility] OR;  Service: Open Heart Surgery;  Laterality: N/A;   RIGHT/LEFT HEART CATH AND CORONARY ANGIOGRAPHY N/A 12/10/2022   Procedure: RIGHT/LEFT HEART CATH AND CORONARY ANGIOGRAPHY;  Surgeon: Tonny Bollman, MD;  Location: [facility] INVASIVE CV LAB;  Service: Cardiovascular;  Laterality: N/A;   SUBXYPHOID PERICARDIAL WINDOW N/A 02/20/2023   Procedure: SUBXYPHOID PERICARDIAL WINDOW;  Surgeon: Alleen Borne, MD;  Location: [facility] OR;  Service: Thoracic;  Laterality: N/A;   Surgical plate in head  1324   TEE WITHOUT CARDIOVERSION N/A 02/08/2023   Procedure: TRANSESOPHAGEAL ECHOCARDIOGRAM;  Surgeon: Alleen Borne, MD;  Location: [facility] OR;  Service: Open Heart Surgery;  Laterality: N/A;   TEE WITHOUT CARDIOVERSION N/A 02/20/2023   Procedure: TRANSESOPHAGEAL ECHOCARDIOGRAM (TEE);  Surgeon: Alleen Borne, MD;  Location: [facility] OR;  Service: Thoracic;  Laterality: N/A;   TOE INTERNAL FIXATION W/ COMPRESSION SCREW  Right     Assessment & Plan Clinical Impression: Patient is a 58 year old RH-male with history of HTN, 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 Laneta Simmers. Post op, he developed hypoxia with ABLA. Amiodarone added for intra operative A fib and noted to have recurrent episodes with A flutter v/s SVT. Dr. Girard Cooter consulted and recommended IV amiodarone bolus with slow taper and changing lopressor to bisoprolol w/ K>4 and Mg>2.0. He continued to have tachycardia with burst of PAF and repeat echo 1/14 revealed large pericardial effusion. He was taken to OR on 01/15 for pericardial window with placement of left pleural chest tube. EPS recommended DOAC once cleared by surgery and continue amiodarone for 3 weeks post op. AKI resolving and lasix d/c. He has been weaned of oxygen and iron supplement added for ABLA. He continues to be limited by sternal precautions and requires supervision to min assist. He lives alone and was independent and working PTA.   Patient transferred to CIR on 03/05/2023 .   Patient currently requires min with mobility secondary to muscle weakness, decreased cardiorespiratoy endurance, and decreased sitting balance, decreased standing balance, decreased postural control, and decreased balance strategies.  Prior to hospitalization, patient was independent  with mobility and lived with   in a   home.  Home access is  Level entry.  Patient will benefit from skilled PT intervention to maximize safe functional mobility and minimize fall risk for planned discharge home with  intermittent assist.  Anticipate patient will benefit from follow up OP at discharge.  PT - End of Session Activity Tolerance: Tolerates 30+ min activity with multiple rests Endurance Deficit: Yes Endurance Deficit Description: generalized weakness PT Assessment Rehab Potential (ACUTE/IP ONLY): Good PT Barriers to  Discharge: Decreased caregiver support PT Patient demonstrates impairments in the following area(s): Balance;Endurance;Motor;Safety PT Transfers Functional Problem(s): Bed Mobility;Bed to Chair;Furniture;Car PT Locomotion Functional Problem(s): Ambulation;Stairs PT Plan PT Intensity: Minimum of 1-2 x/day ,45 to 90 minutes PT Frequency: 5 out of 7 days;Total of 15 hours over 7 days of combined therapies PT Duration Estimated Length of Stay: 7-10 Days PT Treatment/Interventions: Ambulation/gait training;Community reintegration;DME/adaptive equipment instruction;Neuromuscular re-education;Psychosocial support;Stair training;UE/LE Strength taining/ROM;Balance/vestibular training;Pain management;Discharge planning;Skin care/wound management;Therapeutic Activities;UE/LE Coordination activities;Functional mobility training;Disease management/prevention;Cognitive remediation/compensation;Patient/family education;Therapeutic Exercise PT Transfers Anticipated Outcome(s): Mod(I) PT Locomotion Anticipated Outcome(s): Mod(I) PT Recommendation Follow Up Recommendations: Outpatient PT Patient destination: Home Equipment Recommended: To be determined   PT Evaluation Precautions/Restrictions Precautions Precautions: Fall;Sternal Precaution Comments: reviewed precautions during mobility, pt able to verbalize Restrictions Other Position/Activity Restrictions: sternal precautions General Chart Reviewed: Yes Family/Caregiver Present: No Pain Interference Pain Interference Pain Effect on Sleep: 1. Rarely or not at all Pain Interference with Therapy Activities: 1. Rarely or not at all Pain Interference with Day-to-Day Activities: 1. Rarely or not at all Home Living/Prior Functioning Home Living Available Help at Discharge: Friend(s) (reports friend, uncle can come check in) Type of Home: House Home Access: Level entry Home Layout: One level Bathroom Shower/Tub: Health visitor:  Standard  Lives With: Alone Prior Function Level of Independence: Independent with basic ADLs;Independent with gait;Independent with transfers Vocation: Full time employment Vision/Perception  Vision - History Ability to See in Adequate Light: 0 Adequate Perception Perception: Within Functional Limits Praxis Praxis: WFL  Cognition Orientation Level: Oriented X4 Sensation Sensation Light Touch: Appears Intact Coordination Gross Motor Movements are Fluid and Coordinated: No Fine Motor Movements are Fluid and Coordinated: No Coordination and Movement Description: Limited by generalized weakness Motor  Motor Motor: Within Functional Limits  Trunk/Postural Assessment  Cervical Assessment Cervical Assessment: Exceptions to [facility] (forward head) Thoracic Assessment Thoracic Assessment: Within Functional Limits Lumbar Assessment Lumbar Assessment: Within Functional Limits Postural Control Postural Control: Deficits on evaluation (delayed)  Balance Balance Balance Assessed: Yes Static Sitting Balance Static Sitting - Balance Support: Feet supported Static Sitting - Level of Assistance: 7: Independent Dynamic Sitting Balance Dynamic Sitting - Balance Support: Feet supported Dynamic Sitting - Level of Assistance: 6: Modified independent (Device/Increase time) Static Standing Balance Static Standing - Balance Support: During functional activity Static Standing - Level of Assistance: 4: Min assist Dynamic Standing Balance Dynamic Standing - Balance Support: During functional activity Dynamic Standing - Level of Assistance: 4: Min assist Extremity Assessment  RLE Assessment RLE Assessment: Exceptions to [facility] Strength Comments: Grossly 4/5 LLE Assessment LLE Assessment: Exceptions to [facility] Strength Comments: Grossly 4/5  Care Tool Care Tool Bed Mobility Roll left and right activity   Roll left and right assist level: Minimal Assistance - Patient > 75%    Sit to  lying activity   Sit to lying assist level: Minimal Assistance - Patient > 75%    Lying to sitting on side of bed activity   Lying to sitting on side of bed assist level: the ability to move from lying on the back to sitting on the side of the bed with no back support.: Minimal Assistance - Patient > 75%     Care Tool Transfers  Sit to stand transfer   Sit to stand assist level: Minimal Assistance - Patient > 75%    Chair/bed transfer   Chair/bed transfer assist level: Minimal Assistance - Patient > 75%    Car transfer   Car transfer assist level: Minimal Assistance - Patient > 75%      Care Tool Locomotion Ambulation   Assist level: Minimal Assistance - Patient > 75% Assistive device: No Device Max distance: 150'  Walk 10 feet activity   Assist level: Minimal Assistance - Patient > 75% Assistive device: No Device   Walk 50 feet with 2 turns activity   Assist level: Minimal Assistance - Patient > 75% Assistive device: No Device  Walk 150 feet activity   Assist level: Maximal Assistance - Patient 25 - 49% Assistive device: No Device  Walk 10 feet on uneven surfaces activity   Assist level: Minimal Assistance - Patient > 75%    Stairs   Assist level: Minimal Assistance - Patient > 75% Stairs assistive device: 2 hand rails Max number of stairs: 12  Walk up/down 1 step activity   Walk up/down 1 step (curb) assist level: Minimal Assistance - Patient > 75% Walk up/down 1 step or curb assistive device: 2 hand rails  Walk up/down 4 steps activity   Walk up/down 4 steps assist level: Minimal Assistance - Patient > 75% Walk up/down 4 steps assistive device: 2 hand rails  Walk up/down 12 steps activity   Walk up/down 12 steps assist level: Minimal Assistance - Patient > 75% Walk up/down 12 steps assistive device: 2 hand rails  Pick up small objects from floor   Pick up small object from the floor assist level: Total Assistance - Patient < 25%    Wheelchair Is the patient using  a wheelchair?: Yes Type of Wheelchair: Manual   Wheelchair assist level: Dependent - Patient 0%    Wheel 50 feet with 2 turns activity   Assist Level: Dependent - Patient 0%  Wheel 150 feet activity   Assist Level: Dependent - Patient 0%    Refer to Care Plan for Long Term Goals  SHORT TERM GOAL WEEK 1 PT Short Term Goal 1 (Week 1): STGs = LTGs  Recommendations for other services: None   Skilled Therapeutic Intervention  1st Session: Evaluation completed (see details above and below) with education on PT POC and goals and individual treatment initiated with focus on bed mobility, balance, transfers, ambulation, car transfer, and stair navigation. Pt received supine in bed and agrees to therapy. Reports fatigue but no complaint of pain. Pt completes supine to sit with minA and cues for logrolling and body mechanics, as well as sequencing. Pt performs sit to stand with minA and cues for anterior weight shift. Stand step to [facility] with minA and cues for positioning. WC transport to gym. Pt performs ramp navigation and car transfer with minA and cues for sequencing. Following rest break, pt stands and ambulates x150' without AD, with minA. Pt initially ambulates with very short stride lengths and increased postural sway, but is able to increase stride length and gait speed with cues and increased distance. Seated rest break prior to completing x12 6" steps with bilateral hand rails and cues for safety and step sequencing. WC transport back to room. Left seated with alarm intact and all needs within reach.   2nd Session: Pt received supine in bed and agrees to therapy. No complaint of pain. WC transport to gym. Pt performs sit to stand with cues  for hand placement and sequencing, with minA overall. Pt ambulates x175' without, with minA and cues for trunk rotation and increased gait speed to improve balance. PT also provides cues and minA for transition back to WC. Following extended seated rest break,  pt stands and ambulates additional x175' with similar cues and sequencing. Seated rest break. Following rest break, pt completes additional x175' with similar assistance and cues. Following rest break, pt performs alternating foot taps on 6" step with cues for lateral weight shifting and upright posture and trunk stability. Pt completes 2x20 with with seated rest break. WC transport back to room. Stand step to bed with minA and cues for positioning. MinA for logrolling and return to supine. Left with alarm intact and all needs within reach.   Mobility Bed Mobility Bed Mobility: Supine to Sit;Sit to Supine Supine to Sit: Minimal Assistance - Patient > 75% Sit to Supine: Minimal Assistance - Patient > 75% Transfers Transfers: Sit to Stand;Stand to Sit;Stand Pivot Transfers Sit to Stand: Minimal Assistance - Patient > 75% Stand to Sit: Minimal Assistance - Patient > 75% Stand Pivot Transfers: Minimal Assistance - Patient > 75% Stand Pivot Transfer Details: Verbal cues for sequencing;Verbal cues for gait pattern;Verbal cues for technique;Tactile cues for sequencing;Tactile cues for posture;Tactile cues for initiation Transfer (Assistive device): None Locomotion  Gait Ambulation: Yes Gait Assistance: Minimal Assistance - Patient > 75% Gait Distance (Feet): 175 Feet Assistive device: None Gait Assistance Details: Verbal cues for gait pattern;Verbal cues for sequencing;Tactile cues for sequencing Gait Gait: Yes Gait Pattern: Impaired Gait Pattern: Decreased stride length;Wide base of support Stairs / Additional Locomotion Stairs: Yes Stairs Assistance: Minimal Assistance - Patient > 75% Stair Management Technique: Two rails Number of Stairs: 12 Height of Stairs: 6 Ramp: Minimal Assistance - Patient >75% Curb: Minimal Assistance - Patient >75% Wheelchair Mobility Wheelchair Mobility: No   Discharge Criteria: Patient will be discharged from PT if patient refuses treatment 3 consecutive  times without medical reason, if treatment goals not met, if there is a change in medical status, if patient makes no progress towards goals or if patient is discharged from hospital.  The above assessment, treatment plan, treatment alternatives and goals were discussed and mutually agreed upon: by patient  Beau Fanny, PT, DPT 03/06/2023, 4:11 PM

## 2023-03-06 NOTE — Plan of Care (Signed)
  Problem: Consults Goal: RH GENERAL PATIENT EDUCATION Description: See Patient Education module for education specifics. Outcome: Progressing   Problem: RH BOWEL ELIMINATION Goal: RH STG MANAGE BOWEL WITH ASSISTANCE Description: STG Manage Bowel with Mod I Assistance. Outcome: Progressing Goal: RH STG MANAGE BOWEL W/MEDICATION W/ASSISTANCE Description: STG Manage Bowel with Medication with Assistance. Outcome: Progressing   Problem: RH BLADDER ELIMINATION Goal: RH STG MANAGE BLADDER WITH ASSISTANCE Description: STG Manage Bladder With Mod I Assistance Outcome: Progressing   Problem: RH SAFETY Goal: RH STG ADHERE TO SAFETY PRECAUTIONS W/ASSISTANCE/DEVICE Description: STG Adhere to Safety Precautions With  Mod I Assistance/Device. Outcome: Progressing   Problem: RH PAIN MANAGEMENT Goal: RH STG PAIN MANAGED AT OR BELOW PT'S PAIN GOAL Description: <4 w/ prns Outcome: Progressing   Problem: RH KNOWLEDGE DEFICIT GENERAL Goal: RH STG INCREASE KNOWLEDGE OF SELF CARE AFTER HOSPITALIZATION Description:  Manage knowledge on self care after hospitalization with Mod I assistance   Outcome: Progressing

## 2023-03-06 NOTE — Discharge Instructions (Addendum)
Inpatient Rehab Discharge Instructions  Jerry [name] Discharge date and time:    Activities/Precautions/ Functional Status: Activity: no lifting, driving, or strenuous exercise till cleared by Dr. Laneta Simmers. CONTINUE STERNAL PRECAUTIONS.  Diet: low fat, low cholesterol diet Wound Care: keep wound clean and dry. Contact Dr. Laneta Simmers if you develop any problems with your incision/wound--redness, swelling, increase in pain, drainage or if you develop fever or chills.    Functional status:  ___ No restrictions     ___ Walk up steps independently ___ 24/7 supervision/assistance   ___ Walk up steps with assistance ___ Intermittent supervision/assistance  ___ Bathe/dress independently ___ Walk with walker     ___ Bathe/dress with assistance ___ Walk Independently    ___ Shower independently ___ Walk with assistance    ___ Shower with assistance _X__ No alcohol     ___ Return to work/school ________   Special Instructions:  COMMUNITY REFERRALS UPON DISCHARGE:    Home Health:   PT     OT                Agency: [facility]    Phone: [phone] **Please expect follow-up within 2-3 business days for discharge to schedule your home visit. If you have not received follow-up, be sure to contact the site directly.*   Medical Equipment/Items Ordered:rollator                                                  Agency/Supplier:[facility] [phone]   My questions have been answered and I understand these instructions. I will adhere to these goals and the provided educational materials after my discharge from the hospital.  Patient/Caregiver Signature _______________________________ Date __________  Clinician Signature _______________________________________ Date __________  Please bring this form and your medication list with you to all your follow-up doctor's appointments.

## 2023-03-06 NOTE — Progress Notes (Signed)
PROGRESS NOTE   Subjective/Complaints:  No events overnight.  No Acute complaints.  Vital stable.  AM labs appropriate. Ate 100% of dinner.  Continent of bladder.  ROS: Denies fevers, chills, N/V, abdominal pain, constipation, diarrhea, SOB, cough, chest pain, new weakness or paraesthesias.    Objective:   No results found. Recent Labs    03/06/23 0525  WBC 9.2  HGB 11.6*  HCT 36.5*  PLT 263   Recent Labs    03/04/23 0305 03/06/23 0525  NA 139 140  K 4.2 4.3  CL 107 109  CO2 21* 22  GLUCOSE 90 94  BUN 17 15  CREATININE 1.20 1.25*  CALCIUM 9.0 8.9    Intake/Output Summary (Last 24 hours) at 03/06/2023 0826 Last data filed at 03/06/2023 0735 Gross per 24 hour  Intake 240 ml  Output 370 ml  Net -130 ml        Physical Exam: Vital Signs Blood pressure 139/76, pulse 66, temperature (!) 97.5 F (36.4 C), temperature source Oral, resp. rate 18, height 5\' 10"  (1.778 m), weight 97.2 kg, SpO2 96%.    Constitutional: No apparent distress. Appropriate appearance for age.  HENT: No JVD. Neck Supple. Trachea midline. Atraumatic; prior craniotomy well healed.  Eyes: PERRLA. EOMI. Visual fields grossly intact.  Cardiovascular: RRR, no murmurs/rub/gallops. No Edema. Peripheral pulses 2+  Respiratory: CTAB. No rales, rhonchi, or wheezing. On RA.  Abdomen: + bowel sounds, normoactive. No distention or tenderness.  GU: Not examined. +Purwick, draining dark urine.  Skin: C/D/I. No apparent lesions. Scare from prior craniotomy well healed. Sternal incision dermabonded, c/d/I, well approcximated + trochar surgical sites on L upper abdomen and bilateral lower abdomen; not completely approximated but without erythema or drainage. Chronic appearing skin discolorations on bilateral lower extremities MSK:      No apparent deformity. Full AROM.        Neurologic exam:  Cognition: AAO to person, place, time and event.   Follows all simple commands.  Some impulsivity, but no gross memory deficits. Insight: Good  insight into current condition.  Mood: Pleasant affect, appropriate mood.  Sensation: To light touch intact in BL UEs and LEs  Reflexes: 2+ in BL UE and LEs. Negative Hoffman's and babinski signs bilaterally.  CN: 2-12 grossly intact.  Coordination: No apparent tremors. No ataxia on FTN, HTS bilaterally.  Spasticity: MAS 0 in all extremities.       Strength: Out of 5 in proximal lower extremities, otherwise 5 out of 5 throughout     Assessment/Plan: 1. Functional deficits which require 3+ hours per day of interdisciplinary therapy in a comprehensive inpatient rehab setting. Physiatrist is providing close team supervision and 24 hour management of active medical problems listed below. Physiatrist and rehab team continue to assess barriers to discharge/monitor patient progress toward functional and medical goals  Care Tool:  Bathing              Bathing assist       Upper Body Dressing/Undressing Upper body dressing        Upper body assist      Lower Body Dressing/Undressing Lower body dressing  Lower body assist       Toileting Toileting    Toileting assist       Transfers Chair/bed transfer  Transfers assist           Locomotion Ambulation   Ambulation assist              Walk 10 feet activity   Assist           Walk 50 feet activity   Assist           Walk 150 feet activity   Assist           Walk 10 feet on uneven surface  activity   Assist           Wheelchair     Assist               Wheelchair 50 feet with 2 turns activity    Assist            Wheelchair 150 feet activity     Assist          Blood pressure 139/76, pulse 66, temperature (!) 97.5 F (36.4 C), temperature source Oral, resp. rate 18, height 5\' 10"  (1.778 m), weight 97.2 kg, SpO2 96%. Medical Problem  List and Plan: 1. Functional deficits secondary to debility related AS and subsequent AVR, ultimately pericardial window for large pericardial effusion             -patient may  shower             -ELOS/Goals: 7-10 days, mod I goals             -sternal precautions 2.  Antithrombotics: -DVT/anticoagulation:  Pharmaceutical: Lovenox added             -antiplatelet therapy:  ASA 3. Pain Management:  Oxycodone prn for severe pain and ultram prn for moderate pain. 4. Mood/Behavior/Sleep: LCSW to follow for evaluation and support.              -antipsychotic agents: N/A 5. Neuropsych/cognition: This patient is capable of making decisions on his own behalf. 6. Skin/Wound Care:  Monitor incisions for healing.  7. Fluids/Electrolytes/Nutrition: Strict I/O. Refusing ensure --Will  change to prosource to promote wound healing 8. A fib: Has been tapered to amiodarone 200 mg bid with metoprolol 12.5 mg bid.  Monitor for symptoms with increase in activity             -heart rate controlled and rhythm controlled upon admit 9. H/o Seizure d/o: Continue Dilantin  at bedtime and Depakote bid--last was 15 years ago             --Followed by  [facility]  10. ABLA: On iron supplement.  11. AKI: Baseline Scr-1.15. Has been improving with SCr down to 1.20. 12. Constipation: On Dulcolax tab with Senna but refusing laxative every other day -will simplify to Senna S -2 tabs daily.      LOS: 1 days A FACE TO FACE EVALUATION WAS PERFORMED  Jerry [name] 03/06/2023, 8:26 AM

## 2023-03-06 NOTE — Progress Notes (Signed)
Inpatient Rehabilitation Admission Medication Review by a Pharmacist  A complete drug regimen review was completed for this patient to identify any potential clinically significant medication issues.  High Risk Drug Classes Is patient taking? Indication by Medication  Antipsychotic No   Anticoagulant Yes Lovenox - DVT prophylaxis  Antibiotic No   Opioid Yes Oxycodone - pain  Antiplatelet Yes Aspirin - AVR  Hypoglycemics/insulin No   Vasoactive Medication Yes Metoprolol - a fib Amiodarone - afib  Chemotherapy No   Other Yes Valproic acid and phenytoin - seizure d/o Trigels - vitamin supplementation Crestor - HLD Compazine - N&V Tramadol - pain     Type of Medication Issue Identified Description of Issue Recommendation(s)  Drug Interaction(s) (clinically significant)     Duplicate Therapy     Allergy     No Medication Administration End Date     Incorrect Dose     Additional Drug Therapy Needed  Vitamin D3  Patient home med  - consider continuation as indicated  Significant med changes from prior encounter (inform family/care partners about these prior to discharge).    Other       Clinically significant medication issues were identified that warrant physician communication and completion of prescribed/recommended actions by midnight of the next day:  No  Name of provider notified for urgent issues identified:   Provider Method of Notification:     Pharmacist comments:   Time spent performing this drug regimen review (minutes):  15   Jeanella Cara, PharmD, [place] Clinical Pharmacist Please see AMION for all Pharmacists' Contact Phone Numbers 03/06/2023, 7:16 AM

## 2023-03-07 DIAGNOSIS — R5381 Other malaise: Secondary | ICD-10-CM | POA: Diagnosis not present

## 2023-03-07 LAB — BASIC METABOLIC PANEL
Anion gap: 11 (ref 5–15)
BUN: 20 mg/dL (ref 6–20)
CO2: 24 mmol/L (ref 22–32)
Calcium: 9 mg/dL (ref 8.9–10.3)
Chloride: 105 mmol/L (ref 98–111)
Creatinine, Ser: 1.44 mg/dL — ABNORMAL HIGH (ref 0.61–1.24)
GFR, Estimated: 57 mL/min — ABNORMAL LOW (ref >60)
Glucose, Bld: 87 mg/dL (ref 70–99)
Potassium: 4.1 mmol/L (ref 3.5–5.1)
Sodium: 140 mmol/L (ref 135–145)

## 2023-03-07 LAB — CBC
HCT: 37.6 % — ABNORMAL LOW (ref 39.0–52.0)
Hemoglobin: 12 g/dL — ABNORMAL LOW (ref 13.0–17.0)
MCH: 33.5 pg (ref 26.0–34.0)
MCHC: 31.9 g/dL (ref 30.0–36.0)
MCV: 105 fL — ABNORMAL HIGH (ref 80.0–100.0)
Platelets: 252 10*3/uL (ref 150–400)
RBC: 3.58 MIL/uL — ABNORMAL LOW (ref 4.22–5.81)
RDW: 14.4 % (ref 11.5–15.5)
WBC: 10.3 10*3/uL (ref 4.0–10.5)
nRBC: 0 % (ref 0.0–0.2)

## 2023-03-07 MED ORDER — TRAZODONE HCL 50 MG PO TABS: 50.0000 mg | ORAL_TABLET | Freq: Every evening | ORAL | Status: DC | PRN

## 2023-03-07 MED ORDER — TRAMADOL HCL 50 MG PO TABS
50.0000 mg | ORAL_TABLET | Freq: Four times a day (QID) | ORAL | Status: DC | PRN
  Administered 2023-03-08 – 2023-03-10 (×3): 50 mg via ORAL
  Filled 2023-03-07 (×3): qty 1

## 2023-03-07 NOTE — Progress Notes (Signed)
Occupational Therapy Session Note  Patient Details  Name: Jerry [name] MRN: [id number] Date of Birth: 11-28-1965  Today's Date: 03/07/2023 OT Individual Time: 1015-1100 OT Individual Time Calculation (min): 45 min    Short Term Goals: Week 1:  OT Short Term Goal 1 (Week 1): STG= LTG d/t ELOS   Skilled Therapeutic Interventions/Progress Updates:    1:1 Pt received in the w/c. Pt declined formal bathing and dressing today. Pt able to doff gripper socks and don shoes with setup. Pt ambulated from his room to the dayroom with RW with contact guard to supervision without a standing rest break but does report fatigued and takes a short seated rest break when arrived in dayroom. Transitioned to the [facility] and performed stepper with moderate to maximum resistance and then transitioned into performing this in standing. Attempted to perform this without UE support but reported he didn't feel confident enough despite providing support/ min A. Pt performed for 1-2 stints with seated rest breaks in between. Discussed home and his biggest barrier is endurance. Provided a Rollator and practiced using it navigating back to his room.  We practiced turning it and parking against a wall, applying brakes and turning and sitting in it safely and then returning to sitting.   Therapy Documentation Precautions:  Precautions Precautions: Fall, Sternal Precaution Booklet Issued: No Precaution Comments: sternal precautions Restrictions Weight Bearing Restrictions Per Provider Order: No Other Position/Activity Restrictions: sternal precautions  Pain: No indication of pain in session   Therapy/Group: Individual Therapy  Roney Mans [facility] 03/07/2023, 1:34 PM

## 2023-03-07 NOTE — Progress Notes (Signed)
PROGRESS NOTE   Subjective/Complaints:  No events overnight.  No Acute complaints.  Vitals stable Poor sleep per log; patient states due to TV issues.  Nursing notes patient disoriented/confused on exam; patient fully oriented with provider and appropriate.   ROS: Denies fevers, chills, N/V, abdominal pain, constipation, diarrhea, SOB, cough, chest pain, new weakness or paraesthesias.   + Difficulty sleeping  Objective:   No results found. Recent Labs    03/06/23 0525 03/07/23 0601  WBC 9.2 10.3  HGB 11.6* 12.0*  HCT 36.5* 37.6*  PLT 263 252   Recent Labs    03/06/23 0525 03/07/23 0601  NA 140 140  K 4.3 4.1  CL 109 105  CO2 22 24  GLUCOSE 94 87  BUN 15 20  CREATININE 1.25* 1.44*  CALCIUM 8.9 9.0    Intake/Output Summary (Last 24 hours) at 03/07/2023 2210 Last data filed at 03/07/2023 2108 Gross per 24 hour  Intake 800 ml  Output 750 ml  Net 50 ml        Physical Exam: Vital Signs Blood pressure (!) 114/52, pulse 73, temperature 98 F (36.7 C), temperature source Oral, resp. rate 18, height 5\' 10"  (1.778 m), weight 98.3 kg, SpO2 97%.    Constitutional: No apparent distress.  Sitting in bedside chair. HENT:  Atraumatic; prior craniotomy well healed.  Eyes: PERRLA. EOMI. Visual fields grossly intact.  Cardiovascular: RRR, no murmurs/rub/gallops. No Edema. Peripheral pulses 2+  Respiratory: CTAB. No rales, rhonchi, or wheezing. On RA.  Abdomen: + bowel sounds, normoactive. No distention or tenderness.  Psych: Appropriate mood, flat affect, occassionally tangential but appropriate.   Skin: Sternal incision dermabonded, c/d/I, well approximated chronic appearing skin discolorations on bilateral lower extremities  MSK:      No apparent deformity. Full AROM--limited somewhat by sternal precautions       Neurologic exam:  Cognition: AAO to person, place, time and event.  Follows all simple  commands + Mild impulsivity, poor attention. Insight: Good  insight into current condition.  Mood: Pleasant affect, appropriate mood.  Sensation: To light touch intact in BL UEs and LEs  Reflexes: 2+ in BL UE and LEs. Negative Hoffman's and babinski signs bilaterally.  CN: 2-12 grossly intact.  Coordination: No apparent tremors. No ataxia on FTN, HTS bilaterally.  Spasticity: MAS 0 in all extremities.  Strength: 4 out of 5 in proximal lower extremities, otherwise 5 out of 5 throughout     Assessment/Plan: 1. Functional deficits which require 3+ hours per day of interdisciplinary therapy in a comprehensive inpatient rehab setting. Physiatrist is providing close team supervision and 24 hour management of active medical problems listed below. Physiatrist and rehab team continue to assess barriers to discharge/monitor patient progress toward functional and medical goals  Care Tool:  Bathing    Body parts bathed by patient: Right arm, Left upper leg, Left arm, Right lower leg, Abdomen, Left lower leg, Front perineal area, Face, Buttocks, Right upper leg, Chest         Bathing assist Assist Level: Minimal Assistance - Patient > 75%     Upper Body Dressing/Undressing Upper body dressing   What is the patient wearing?: Pull over shirt  Upper body assist Assist Level: Minimal Assistance - Patient > 75%    Lower Body Dressing/Undressing Lower body dressing      What is the patient wearing?: Underwear/pull up, Pants     Lower body assist Assist for lower body dressing: Minimal Assistance - Patient > 75%     Toileting Toileting    Toileting assist Assist for toileting: Minimal Assistance - Patient > 75%     Transfers Chair/bed transfer  Transfers assist     Chair/bed transfer assist level: Minimal Assistance - Patient > 75%     Locomotion Ambulation   Ambulation assist      Assist level: Minimal Assistance - Patient > 75% Assistive device: No Device Max  distance: 150'   Walk 10 feet activity   Assist     Assist level: Minimal Assistance - Patient > 75% Assistive device: No Device   Walk 50 feet activity   Assist    Assist level: Minimal Assistance - Patient > 75% Assistive device: No Device    Walk 150 feet activity   Assist    Assist level: Maximal Assistance - Patient 25 - 49% Assistive device: No Device    Walk 10 feet on uneven surface  activity   Assist     Assist level: Minimal Assistance - Patient > 75%     Wheelchair     Assist Is the patient using a wheelchair?: Yes Type of Wheelchair: Manual    Wheelchair assist level: Dependent - Patient 0%      Wheelchair 50 feet with 2 turns activity    Assist        Assist Level: Dependent - Patient 0%   Wheelchair 150 feet activity     Assist      Assist Level: Dependent - Patient 0%   Blood pressure (!) 114/52, pulse 73, temperature 98 F (36.7 C), temperature source Oral, resp. rate 18, height 5\' 10"  (1.778 m), weight 98.3 kg, SpO2 97%. Medical Problem List and Plan: 1. Functional deficits secondary to debility related AS and subsequent AVR, ultimately pericardial window for large pericardial effusion             -patient may  shower             -ELOS/Goals: 7-10 days, mod I goals             -sternal precautions  -Stable to continue CIR  2.  Antithrombotics: -DVT/anticoagulation:  Pharmaceutical: Lovenox added             -antiplatelet therapy:  ASA 3. Pain Management:  Oxycodone prn for severe pain and ultram prn for moderate pain.   - 1/30: used oxycodone 1x; DC  4. Mood/Behavior/Sleep: LCSW to follow for evaluation and support.              -antipsychotic agents: N/A  1-29: Mild sleep deficits due to difficulty with television; encourage patient to inform charge nurse to TV can get fixed.  Has melatonin 5 mg as needed available  5. Neuropsych/cognition: This patient is capable of making decisions on his own behalf. 6.  Skin/Wound Care:  Monitor incisions for healing.  Healing well. 7. Fluids/Electrolytes/Nutrition: Strict I/O. Refusing ensure --Will  change to prosource to promote wound healing -1-2 9: AM labs appropriate.  8. A fib: Has been tapered to amiodarone 200 mg bid with metoprolol 12.5 mg bid.  Monitor for symptoms with increase in activity             -  heart rate controlled and rhythm controlled      03/07/2023    6:42 PM 03/07/2023   12:54 PM 03/07/2023    7:49 AM  Vitals with BMI  Systolic 114 119 409  Diastolic 52 80 63  Pulse 73 69 70     9. H/o Seizure d/o: Continue Dilantin  at bedtime and Depakote bid--last was 15 years ago             --Followed by  [facility]   10. ABLA: On iron supplement.   -Improved from 9.6-11 on admission  11. AKI: Baseline Scr-1.15. Has been improving with SCr down to 1.20.  -1-28: 1.25 today, BUN unchanged, encourage p.o. fluids and monitor weekly  1/30: Cr up to 1.44; encourage PO fluids - did do better today so will repeat 1/31    12. Constipation: On Dulcolax tab with Senna but refusing laxative every other day -will simplify to Senna S -2 tabs daily.  - LBM 1/25       LOS: 2 days A FACE TO FACE EVALUATION WAS PERFORMED  Angelina Sheriff 03/07/2023, 10:10 PM

## 2023-03-07 NOTE — Progress Notes (Deleted)
Inpatient Rehabilitation Care Coordinator Assessment and Plan Patient Details  Name: Jerry [name] MRN: [id number] Date of Birth: 10/21/1965  Today's Date: 03/07/2023  Hospital Problems: Principal Problem:   Debility  Past Medical History:  Past Medical History:  Diagnosis Date   Aortic stenosis    Head injury    Subdural hematoma after a traumatic brain injury in 1984   Heart murmur    Hypertension    Mixed hyperlipidemia    Seizure (HCC)    Skin cancer    Varicose veins    Vasculitis (HCC) 11/2014   Past Surgical History:  Past Surgical History:  Procedure Laterality Date   AORTIC VALVE REPLACEMENT N/A 02/08/2023   Procedure: AORTIC VALVE REPLACEMENT (AVR) USING INSPIRIS AORTIC VALVE SIZE ;  Surgeon: Alleen Borne, MD;  Location: [facility] OR;  Service: Open Heart Surgery;  Laterality: N/A;   RIGHT/LEFT HEART CATH AND CORONARY ANGIOGRAPHY N/A 12/10/2022   Procedure: RIGHT/LEFT HEART CATH AND CORONARY ANGIOGRAPHY;  Surgeon: Tonny Bollman, MD;  Location: [facility] INVASIVE CV LAB;  Service: Cardiovascular;  Laterality: N/A;   SUBXYPHOID PERICARDIAL WINDOW N/A 02/20/2023   Procedure: SUBXYPHOID PERICARDIAL WINDOW;  Surgeon: Alleen Borne, MD;  Location: [facility] OR;  Service: Thoracic;  Laterality: N/A;   Surgical plate in head  1324   TEE WITHOUT CARDIOVERSION N/A 02/08/2023   Procedure: TRANSESOPHAGEAL ECHOCARDIOGRAM;  Surgeon: Alleen Borne, MD;  Location: [facility] OR;  Service: Open Heart Surgery;  Laterality: N/A;   TEE WITHOUT CARDIOVERSION N/A 02/20/2023   Procedure: TRANSESOPHAGEAL ECHOCARDIOGRAM (TEE);  Surgeon: Alleen Borne, MD;  Location: [facility] OR;  Service: Thoracic;  Laterality: N/A;   TOE INTERNAL FIXATION W/ COMPRESSION SCREW Right    Social History:  reports that he quit smoking about 8 years ago. His smoking use included cigarettes. He has never used smokeless tobacco. He reports that he does not drink alcohol and does not use drugs.  Family / Support  Systems Ability/Limitations of Caregiver: per EMR, pt will d/c to home support from a friend [name] and other friends to help with care PRN. Caregiver Availability: Other (Comment) (PRN) Family Dynamics: pt lives alone  Social History Preferred language: English Religion: [employer] - How often do you need to have someone help you when you read instructions, pamphlets, or other written material from your doctor or pharmacy?: Never Writes: Yes   Abuse/Neglect Abuse/Neglect Assessment Can Be Completed: Unable to assess, patient is non-responsive or altered mental status Physical Abuse: Denies Verbal Abuse: Denies Sexual Abuse: Denies Exploitation of patient/patient's resources: Denies Self-Neglect: Denies  Patient response to: Social Isolation - How often do you feel lonely or isolated from those around you?: Patient unable to respond  Emotional Status    Patient / Family Perceptions, Expectations & Goals    Building surveyor: None Premorbid Home Care/DME Agencies: None Transportation available at discharge: TBD Resource referrals recommended: Neuropsychology  Discharge Planning Living Arrangements: Alone Support Systems: Friends/neighbors Type of Residence: Private residence Insurance Resources: Media planner (specify) Herbalist) Financial Screen Referred: No Living Expenses: Banker Management: Patient Does the patient have any problems obtaining your medications?: No Home Management: pt managed all home care needs Patient/Family Preliminary Plans: TBD Care Coordinator Barriers to Discharge: Decreased caregiver support, Lack of/limited family support, Insurance for SNF coverage Care Coordinator Anticipated Follow Up Needs: HH/OP Expected length of stay: 7-10 days  Clinical Impression SW completed chart audit for assessment. SW will update once able too.   Aubry Rankin A Joylynn Defrancesco 03/07/2023,  1:45 PM

## 2023-03-07 NOTE — Progress Notes (Signed)
Physical Therapy Session Note  Patient Details  Name: Jerry [name] MRN: [id number] Date of Birth: 1965-12-14  Today's Date: 03/07/2023 PT Individual Time: 0800-0855 PT Individual Time Calculation (min): 55 min   Short Term Goals: Week 1:  PT Short Term Goal 1 (Week 1): STGs = LTGs  Skilled Therapeutic Interventions/Progress Updates:     Pt received supine in bed and agrees to therapy. Reports some soreness in chest. PT provides repositioning and rest breaks to manage pain. Pt performs supine to sit with minA and cues for logrolling. Sit to stand with minA from EOB with arms across chest to maintain sternal precautions and challenge strength and balance. Pt ambulates x15' to toilet with CGA and cues for posture and positioning for safety. Pt void bladder in standing then ambulates to sink to complete hand hygiene and teeth brushing. PT provides close supervision and cues for safety.  WC transport to gym. PT provides directions for 6 Minute Walk Test, then pt completes with CGA and no AD, with score of 666'. Cues provided for upright gaze to improve posture and balance, and increasing stride length to decrease risk for falls. Pt takes extended seated rest break. Following, pt ambulates x49' with similar assistance and cues. WC transport back to room. Left seated in WC with alarm intact and all needs within reach.   Therapy Documentation Precautions:  Precautions Precautions: Fall, Sternal Precaution Booklet Issued: No Precaution Comments: sternal precautions Restrictions Weight Bearing Restrictions Per Provider Order: No Other Position/Activity Restrictions: sternal precautions   Therapy/Group: Individual Therapy  Beau Fanny, PT, DPT 03/07/2023, 4:09 PM

## 2023-03-07 NOTE — Progress Notes (Signed)
Patient ID: Jerry [name], male   DOB: 1965/05/11, 58 y.o.   MRN: [id number]  SW went by to meet with pt and pt was sleeping. SW will follow-up to update assessment.   Cecile Sheerer, MSW, LCSW Office: [phone] Cell: [phone] Fax: [phone]

## 2023-03-07 NOTE — Progress Notes (Signed)
Physical Therapy Session Note  Patient Details  Name: Jerry [name] MRN: [id number] Date of Birth: 06/23/1965  Today's Date: 03/07/2023 PT Individual Time: 8295-6213, 1401-1430 PT Individual Time Calculation (min): 60 min, 29 min   Short Term Goals: Week 1:  PT Short Term Goal 1 (Week 1): STGs = LTGs  Skilled Therapeutic Interventions/Progress Updates:      Treatment Session 1   Pt seated in WC upon arrival. Pt agreeable to therapy. Pt denies any pain.   Treatment session focused on endurance and gait training with use of Rollator.   Pt ambulated with close supervision room to midwest, midwest to [employer], [place] tower to [employer], ortho gym to room with rollator and supervision, pt required multiple prolonged seated rest breaks 2/2 fatigue, SOB, verbal cues provided for parking rollator against wall and locking brakes for seated rest break. Therapist adjusted height of rollator for proper fit for pt.   Pt demos and reports fear of heights upon entering north tower bridge, and required max verbal cues for safety with rollator 2/2 fear and anxiety with emphaiss on keeping feet inside frame of rollator for navigating 180 deg turn.   Standing hip abduction x ~10 feet B with no AD and CGA for stability.   Sit to stand 1x3, 1x5 with arms across chest and close supervision, pt required prolonged seated rest break 2/2 SOB and fatigue.   Pt supine in bed at end of session with all needs within reach and bed alarm on.    Treatment Session 2   Pt supine in bed upon arrival. Pt agreeable to therapy. Pt denies any pain.   Supine to sit with use of bed features and min A for trunk.   Sit to stand and ambualtion with Rollator and supervision room<>main gym.   Pt picked up cones x2 off of the floor with min A no AD, with max verbal cues provided for safety with sternal precautions, however discontinued for safety 2/2 SOB and legs shaking. Pt picked up cones x4 with no AD and use of  reacher with CGA, verbal cues provided for technique. Pt reports he has a Sports administrator at home. Education provided to use it, to reduce fall risk and maintain sternal precautions. Pt verbalzied understanding and agreeable.   Pt supine in bed at end of session with all needs within reach and bed alarm on.   Therapy Documentation Precautions:  Precautions Precautions: Fall, Sternal Precaution Booklet Issued: No Precaution Comments: sternal precautions Restrictions Weight Bearing Restrictions Per Provider Order: No Other Position/Activity Restrictions: sternal precautions  Therapy/Group: Individual Therapy  [facility] [place], [place], DPT  03/07/2023, 7:43 AM

## 2023-03-07 NOTE — Care Management (Signed)
Inpatient Rehabilitation Center Individual Statement of Services  Patient Name:  Jerry [name]  Date:  03/07/2023  Welcome to the Inpatient Rehabilitation Center.  Our goal is to provide you with an individualized program based on your diagnosis and situation, designed to meet your specific needs.  With this comprehensive rehabilitation program, you will be expected to participate in at least 3 hours of rehabilitation therapies Monday-Friday, with modified therapy programming on the weekends.  Your rehabilitation program will include the following services:  Physical Therapy (PT), Occupational Therapy (OT), 24 hour per day rehabilitation nursing, Therapeutic Recreaction (TR), Psychology, Neuropsychology, Care Coordinator, Rehabilitation Medicine, Nutrition Services, Pharmacy Services, and Other  Weekly team conferences will be held on Wednesdays to discuss your progress.  Your Inpatient Rehabilitation Care Coordinator will talk with you frequently to get your input and to update you on team discussions.  Team conferences with you and your family in attendance may also be held.  Expected length of stay: 7-10 days    Overall anticipated outcome: Independent with Assistive Device  Depending on your progress and recovery, your program may change. Your Inpatient Rehabilitation Care Coordinator will coordinate services and will keep you informed of any changes. Your Inpatient Rehabilitation Care Coordinator's name and contact numbers are listed  below.  The following services may also be recommended but are not provided by the Inpatient Rehabilitation Center:  Driving Evaluations Home Health Rehabiltiation Services Outpatient Rehabilitation Services Vocational Rehabilitation   Arrangements will be made to provide these services after discharge if needed.  Arrangements include referral to agencies that provide these services.  Your insurance has been verified to be:  BCBS  Your primary doctor  is:  Mitzi Hansen  Pertinent information will be shared with your doctor and your insurance company.  Inpatient Rehabilitation Care Coordinator:  Susie Cassette [phone] or [phone]  Information discussed with and copy given to patient by: Gretchen Short, 03/07/2023, 9:37 AM

## 2023-03-08 DIAGNOSIS — R5381 Other malaise: Secondary | ICD-10-CM | POA: Diagnosis not present

## 2023-03-08 LAB — BASIC METABOLIC PANEL
Anion gap: 11 (ref 5–15)
BUN: 15 mg/dL (ref 6–20)
CO2: 23 mmol/L (ref 22–32)
Calcium: 8.8 mg/dL — ABNORMAL LOW (ref 8.9–10.3)
Chloride: 106 mmol/L (ref 98–111)
Creatinine, Ser: 1.23 mg/dL (ref 0.61–1.24)
GFR, Estimated: >60 mL/min (ref >60)
Glucose, Bld: 83 mg/dL (ref 70–99)
Potassium: 3.9 mmol/L (ref 3.5–5.1)
Sodium: 140 mmol/L (ref 135–145)

## 2023-03-08 NOTE — IPOC Note (Signed)
Overall Plan of Care [facility]) Patient Details Name: Jerry [name] MRN: [id number] DOB: [birth year]  Admitting Diagnosis: Debility  Hospital Problems: Principal Problem:   Debility     Functional Problem List: Nursing Bladder, Bowel, Endurance, Medication Management, Pain, Safety  PT Balance, Endurance, Motor, Safety  OT Balance, Motor, Endurance, Safety  SLP    TR         Basic ADL's: OT Bathing, Dressing, Toileting     Advanced  ADL's: OT Simple Meal Preparation, Light Housekeeping     Transfers: PT Bed Mobility, Bed to Chair, Furniture, Customer service manager, Research scientist (life sciences): PT Ambulation, Stairs     Additional Impairments: OT None  SLP        TR      Anticipated Outcomes Item Anticipated Outcome  Self Feeding no self feeding goal  Swallowing      Basic self-care  mod I  Toileting  mod I   Bathroom Transfers mod I  Bowel/Bladder  manage bowel with medication/ manage bladder with time toileting  Transfers  Mod(I)  Locomotion  Mod(I)  Communication     Cognition     Pain  <4w/ prns  Safety/Judgment  manage safety mod I assistance   Therapy Plan: PT Intensity: Minimum of 1-2 x/day ,45 to 90 minutes PT Frequency: 5 out of 7 days, Total of 15 hours over 7 days of combined therapies PT Duration Estimated Length of Stay: 7-10 Days OT Intensity: Minimum of 1-2 x/day, 45 to 90 minutes OT Frequency: 5 out of 7 days OT Duration/Estimated Length of Stay: 7- 10 days     Team Interventions: Nursing Interventions Patient/Family Education, Medication Management, Bladder Management, Bowel Management, Disease Management/Prevention, Pain Management, Discharge Planning  PT interventions Ambulation/gait training, Community reintegration, DME/adaptive equipment instruction, Neuromuscular re-education, Psychosocial support, Stair training, UE/LE Strength taining/ROM, Warden/ranger, Pain management, Discharge planning, Skin care/wound  management, Therapeutic Activities, UE/LE Coordination activities, Functional mobility training, Disease management/prevention, Cognitive remediation/compensation, Patient/family education, Therapeutic Exercise  OT Interventions Balance/vestibular training, Discharge planning, Self Care/advanced ADL retraining, Pain management, Therapeutic Activities, Patient/family education, Functional mobility training, Therapeutic Exercise, Community reintegration, DME/adaptive equipment instruction, Psychosocial support  SLP Interventions    TR Interventions    SW/CM Interventions Discharge Planning, Psychosocial Support, Patient/Family Education   Barriers to Discharge MD  Medical stability, Home enviroment access/loayout, Lack of/limited family support, Insurance for SNF coverage, and Behavior  Nursing Decreased caregiver support, Home environment access/layout Home: One level entry  PT Decreased caregiver support    OT Decreased caregiver support lives alone  SLP      SW Decreased caregiver support, Lack of/limited family support, Community education officer for SNF coverage     Team Discharge Planning: Destination: PT-Home ,OT- Home , SLP-  Projected Follow-up: PT-Outpatient PT, OT-  Home health OT, SLP-  Projected Equipment Needs: PT-To be determined, OT- Tub/shower seat, SLP-  Equipment Details: PT- , OT-  Patient/family involved in discharge planning: PT- Patient,  OT-Patient, SLP-   MD ELOS: 7-10 days Medical Rehab Prognosis:  Excellent Assessment: The patient has been admitted for CIR therapies with the diagnosis of debility s/p AVR. The team will be addressing functional mobility, strength, stamina, balance, safety, adaptive techniques and equipment, self-care, bowel and bladder mgt, patient and caregiver education. Goals have been set at Mod I. Anticipated discharge destination is home.       See Team Conference Notes for weekly updates to the plan of care

## 2023-03-08 NOTE — Progress Notes (Signed)
Physical Therapy Session Note  Patient Details  Name: Jerry [name] MRN: [id number] Date of Birth: Aug 19, 1965  Today's Date: 03/08/2023 PT Individual Time: 1418-1530 PT Individual Time Calculation (min): 72 min   Short Term Goals: Week 1:  PT Short Term Goal 1 (Week 1): STGs = LTGs  Skilled Therapeutic Interventions/Progress Updates: Pt presents supine in bed and agreeable to therapy.  Pt performed log roll to left using pillow and then min A for sidelying to sit.  Pt transfers throughout rest of session from rollator and bed and mat table w/ close supervision, 1-2x for cues for sternal precautions.  Pt amb to BR and used urinal for strict I and O, NT notified and will chart 100 ml.  Pt amb > 300' w/ rollator and supervision in hallways and on/off elevator.  Pt amb on uneven, outdoor surfaces w/ Rollator and close supervision including down/up ramped surface.  Pt amb x 130' w/o AD and CGA on uneven surface.  Pt returned to room and transferred sit to sidelying/supine w/ CGA.  Pt remained semi-reclined in bed w/ bed alarm on and all needs in reach.     Therapy Documentation Precautions:  Precautions Precautions: Fall, Sternal Precaution Booklet Issued: No Precaution Comments: sternal precautions Restrictions Weight Bearing Restrictions Per Provider Order: No Other Position/Activity Restrictions: sternal precautions General:   Vital Signs: Therapy Vitals Temp: 98 F (36.7 C) Temp Source: Oral Pulse Rate: 71 Resp: 16 BP: 120/67 Patient Position (if appropriate): Lying Oxygen Therapy SpO2: 96 % O2 Device: Room Air Pain:0/10      Therapy/Group: Individual Therapy  Lucio Edward 03/08/2023, 4:03 PM

## 2023-03-08 NOTE — Progress Notes (Signed)
Inpatient Rehabilitation Care Coordinator Assessment and Plan Patient Details  Name: Jerry [name] MRN: [id number] Date of Birth: March 10, 1965  Today's Date: 03/08/2023  Hospital Problems: Principal Problem:   Debility  Past Medical History:  Past Medical History:  Diagnosis Date   Aortic stenosis    Head injury    Subdural hematoma after a traumatic brain injury in 1984   Heart murmur    Hypertension    Mixed hyperlipidemia    Seizure (HCC)    Skin cancer    Varicose veins    Vasculitis (HCC) 11/2014   Past Surgical History:  Past Surgical History:  Procedure Laterality Date   AORTIC VALVE REPLACEMENT N/A 02/08/2023   Procedure: AORTIC VALVE REPLACEMENT (AVR) USING INSPIRIS AORTIC VALVE SIZE ;  Surgeon: Alleen Borne, MD;  Location: [facility] OR;  Service: Open Heart Surgery;  Laterality: N/A;   RIGHT/LEFT HEART CATH AND CORONARY ANGIOGRAPHY N/A 12/10/2022   Procedure: RIGHT/LEFT HEART CATH AND CORONARY ANGIOGRAPHY;  Surgeon: Tonny Bollman, MD;  Location: [facility] INVASIVE CV LAB;  Service: Cardiovascular;  Laterality: N/A;   SUBXYPHOID PERICARDIAL WINDOW N/A 02/20/2023   Procedure: SUBXYPHOID PERICARDIAL WINDOW;  Surgeon: Alleen Borne, MD;  Location: [facility] OR;  Service: Thoracic;  Laterality: N/A;   Surgical plate in head  4098   TEE WITHOUT CARDIOVERSION N/A 02/08/2023   Procedure: TRANSESOPHAGEAL ECHOCARDIOGRAM;  Surgeon: Alleen Borne, MD;  Location: [facility] OR;  Service: Open Heart Surgery;  Laterality: N/A;   TEE WITHOUT CARDIOVERSION N/A 02/20/2023   Procedure: TRANSESOPHAGEAL ECHOCARDIOGRAM (TEE);  Surgeon: Alleen Borne, MD;  Location: [facility] OR;  Service: Thoracic;  Laterality: N/A;   TOE INTERNAL FIXATION W/ COMPRESSION SCREW Right    Social History:  reports that he quit smoking about 8 years ago. His smoking use included cigarettes. He has never used smokeless tobacco. He reports that he does not drink alcohol and does not use drugs.  Family / Support Systems Marital Status:  Single Spouse/Significant Other: N/A Children: no children Other Supports: friend carol Anticipated Caregiver: None Ability/Limitations of Caregiver: Pt confirms he will d/c tohome alone with PRN support from his friend [name]. He states he has no other friends that can pick him up. 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 Pt reports the company is working on [employer] forms Marine scientist Issues: Denies Guardian/Conservator: N/A   Abuse/Neglect Abuse/Neglect Assessment Can Be Completed: Yes Physical Abuse: Denies Verbal Abuse: Denies Sexual Abuse: Denies Exploitation of patient/patient's resources: Denies Self-Neglect: Denies  Patient response to: Social Isolation - How often do you feel lonely or isolated from those around you?: Never  Emotional Status Pt's affect, behavior and adjustment status: Pt in good spirits at time of visit Recent Psychosocial Issues: Denies Psychiatric History: Denies Substance Abuse History: Denies  Patient / Family Perceptions, Expectations & Goals Pt/Family understanding of illness & functional limitations: Pt has general understanding of care needs Premorbid pt/family roles/activities: Independent Anticipated changes in roles/activities/participation: Assistance with ADLs/IADLs Pt/family expectations/goals: pt would like to work on getting out of the bed better and walking; build up [employer]  Community Agencies: None Premorbid Home Care/DME Agencies:  None Transportation available at discharge: friend [name] Is the patient able to respond to transportation needs?: Yes In the past 12 months, has lack of transportation kept you from medical appointments or from getting medications?: No In the past 12 months, has lack of transportation kept you from meetings, work, or from getting things needed for daily living?: No Resource referrals recommended: Neuropsychology  Discharge Planning Living Arrangements: Alone Support Systems: Friends/neighbors Type of Residence: Private residence Insurance Resources: Media planner (specify) Herbalist) Financial Screen Referred: No Living Expenses: Banker Management: Patient Does the patient have any problems obtaining your medications?: No Home Management: pt managed all home care needs Patient/Family Preliminary Plans: no changes Care Coordinator Barriers to Discharge: Decreased caregiver support, Lack of/limited family support, Insurance for SNF coverage Care Coordinator Anticipated Follow Up Needs: HH/OP Expected length of stay: 7-10 days  Clinical Impression Updated assessment.   Enna Warwick A Wells Gerdeman 03/08/2023, 1:54 PM

## 2023-03-08 NOTE — Progress Notes (Signed)
Occupational Therapy Session Note  Patient Details  Name: Jerry [name] MRN: [id number] Date of Birth: 05-20-1965  Today's Date: 03/08/2023 OT Individual Time: 1100-1200 OT Individual Time Calculation (min): 60 min    Short Term Goals: Week 1:  OT Short Term Goal 1 (Week 1): STG= LTG d/t ELOS  Skilled Therapeutic Interventions/Progress Updates:    Pt received seated with no c/o pain, agreeable to OT session. He declined ADLs. He stood with CGA using the rollator. He completed 150 ft of functional mobility to the therapy gym with CGA using the rollator. He completed functional stepping activity, stepping onto 4 in step and stepping backward off. He completed 3x10 repetitions to challenge cardiovascular endurance, dynamic balance, and functional activity tolerance- carryover to ADLs, home threshold management and reduction of fall risk. Rest breaks between each set. Pt then completed 500 ft of functional mobility with the rollator, in/out of elevator and to the giftshop to simulate community level mobility. He required CGA, progressing to (S) and no rest breaks! He completed grocery shopping simulated task- pushing grocery cart and then carrying loaded bag with no UE support- CGA required. Education provided re community accessibility, reducing fall risk, energy conservation, and sternal precautions. He returned to his room and was left supine with the bed alarm set, all needs within reach.    Therapy Documentation Precautions:  Precautions Precautions: Fall, Sternal Precaution Booklet Issued: No Precaution Comments: sternal precautions Restrictions Weight Bearing Restrictions Per Provider Order: No Other Position/Activity Restrictions: sternal precautions  Therapy/Group: Individual Therapy  Crissie Reese 03/08/2023, 6:19 AM

## 2023-03-08 NOTE — Patient Care Conference (Incomplete)
Inpatient RehabilitationTeam Conference and Plan of Care Update Date: 03/08/2023   Time: 1403 pm    Patient Name: Jerry [name]      Medical Record Number: [id number]  Date of Birth: January 25, 1966 Sex: Male         Room/Bed: 4W22C/4W22C-01 Payor Info: Payor: BLUE CROSS BLUE SHIELD / Plan: BCBS COMM PPO / Product Type: *No Product type* /    Admit Date/Time:  03/05/2023  4:50 PM  Primary Diagnosis:  Debility  Hospital Problems: Principal Problem:   Debility    Expected Discharge Date: Expected Discharge Date: 03/12/23  Team Members Present: Physician leading conference: Dr. Elijah Birk Social Worker Present: Cecile Sheerer, LCSWA Nurse Present: Konrad Dolores, RN PT Present: Malachi Pro, PT OT Present: Jake Shark, OT     Current Status/Progress Goal Weekly Team Focus  Bowel/Bladder   patient continent of b/b [facility] 1/31   maintain continence   offer toileting qshift and PRN    Swallow/Nutrition/ Hydration               ADL's   CGA- (S) ADL transfers, (S) UB ADLs, CGA LB ADLs.   mod I   IADL, functional mobility, dynamic balance, functional activity tolerance    Mobility   independent bed mobility, mod(I) transfers and ambulation with rollator, supervision ambulation without AD   mod(I)  DC prep    Communication                Safety/Cognition/ Behavioral Observations               Pain   patient c/o pain in sternum due to surgery   keep pain controlled   assess pain qshift and PRN    Skin   midsternal  incision skin glue closure   maintain infection free incision  assess wound qshift and PRN      Discharge Planning:  Pt will d/c to home with PRN support from friends. [facility] for HHPT/OT. SW will confirm there are no barriers to discharge.   Team Discussion:  Patient admitted post AV valve replacement and a cardiac cath with debility doing well overall.   Patient on target to meet rehab goals: yes  *See Care Plan and  progress notes for long and short-term goals.   Revisions to Treatment Plan:  N/a  Teaching Needs: Medications, transfers, safety, toileting, etc.  Current Barriers to Discharge: Decreased caregiver support  Possible Resolutions to Barriers: Family Education [facility] Home health follow -up PT/OT DME: Rollator    Medical Summary Current Status: Medically complicated by recent cardiovascular surgery, complex wound healing, prior TBI with cognitive deficits, disordered sleep, pain control, nausea, atrial fibrillation  Barriers to Discharge: Behavior/Mood;Medical stability;Self-care education;Cardiac Complications   Possible Resolutions to [employer] Focus: Wean pain medications as tolerated, promote sleep-wake cycle with daytime stimulation and nightly medication, encourage out of bed and treat nausea as needed, monitor vitals for heart rate, closely monitor for any cardiovascular complications.   Continued Need for Acute Rehabilitation Level of Care: The patient requires daily medical management by a physician with specialized training in physical medicine and rehabilitation for the following reasons: Direction of a multidisciplinary physical rehabilitation program to maximize functional independence : Yes Medical management of patient stability for increased activity during participation in an intensive rehabilitation regime.: Yes Analysis of laboratory values and/or radiology reports with any subsequent need for medication adjustment and/or medical intervention. : Yes   I attest that I was present, lead the team conference, and concur  with the assessment and plan of the team.   Konrad Dolores [facility] 03/12/2023, 9:02 AM

## 2023-03-08 NOTE — Progress Notes (Addendum)
Inpatient Rehabilitation Care Coordinator Assessment and Plan Patient Details  Name: Jerry [name] MRN: [id number] Date of Birth: Jan 12, 1966  Today's Date: 03/08/2023  Hospital Problems: Principal Problem:   Debility  Past Medical History:  Past Medical History:  Diagnosis Date   Aortic stenosis    Head injury    Subdural hematoma after a traumatic brain injury in 1984   Heart murmur    Hypertension    Mixed hyperlipidemia    Seizure (HCC)    Skin cancer    Varicose veins    Vasculitis (HCC) 11/2014   Past Surgical History:  Past Surgical History:  Procedure Laterality Date   AORTIC VALVE REPLACEMENT N/A 02/08/2023   Procedure: AORTIC VALVE REPLACEMENT (AVR) USING INSPIRIS AORTIC VALVE SIZE ;  Surgeon: Alleen Borne, MD;  Location: [facility] OR;  Service: Open Heart Surgery;  Laterality: N/A;   RIGHT/LEFT HEART CATH AND CORONARY ANGIOGRAPHY N/A 12/10/2022   Procedure: RIGHT/LEFT HEART CATH AND CORONARY ANGIOGRAPHY;  Surgeon: Tonny Bollman, MD;  Location: [facility] INVASIVE CV LAB;  Service: Cardiovascular;  Laterality: N/A;   SUBXYPHOID PERICARDIAL WINDOW N/A 02/20/2023   Procedure: SUBXYPHOID PERICARDIAL WINDOW;  Surgeon: Alleen Borne, MD;  Location: [facility] OR;  Service: Thoracic;  Laterality: N/A;   Surgical plate in head  7425   TEE WITHOUT CARDIOVERSION N/A 02/08/2023   Procedure: TRANSESOPHAGEAL ECHOCARDIOGRAM;  Surgeon: Alleen Borne, MD;  Location: [facility] OR;  Service: Open Heart Surgery;  Laterality: N/A;   TEE WITHOUT CARDIOVERSION N/A 02/20/2023   Procedure: TRANSESOPHAGEAL ECHOCARDIOGRAM (TEE);  Surgeon: Alleen Borne, MD;  Location: [facility] OR;  Service: Thoracic;  Laterality: N/A;   TOE INTERNAL FIXATION W/ COMPRESSION SCREW Right    Social History:  reports that he quit smoking about 8 years ago. His smoking use included cigarettes. He has never used smokeless tobacco. He reports that he does not drink alcohol and does not use drugs.  Family / Support Systems Marital Status:  Single Spouse/Significant Other: N/A Children: no children Other Supports: friend carol Anticipated Caregiver: None Ability/Limitations of Caregiver: Pt confirms he will d/c tohome alone with PRN support from his friend [name]. He states he has no other friends that can pick him up. 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 Pt reports the company is working on [employer] forms Marine scientist Issues: Denies Guardian/Conservator: N/A   Abuse/Neglect Abuse/Neglect Assessment Can Be Completed: Yes Physical Abuse: Denies Verbal Abuse: Denies Sexual Abuse: Denies Exploitation of patient/patient's resources: Denies Self-Neglect: Denies  Patient response to: Social Isolation - How often do you feel lonely or isolated from those around you?: Never  Emotional Status Pt's affect, behavior and adjustment status: Pt in good spirits at time of visit Recent Psychosocial Issues: Denies Psychiatric History: Denies Substance Abuse History: Denies  Patient / Family Perceptions, Expectations & Goals Pt/Family understanding of illness & functional limitations: Pt has general understanding of care needs Premorbid pt/family roles/activities: Independent Anticipated changes in roles/activities/participation: Assistance with ADLs/IADLs Pt/family expectations/goals: pt would like to work on getting out of the bed better and walking; build up [employer]  Community Agencies: None Premorbid Home Care/DME Agencies:  None Transportation available at discharge: friend [name] Is the patient able to respond to transportation needs?: Yes In the past 12 months, has lack of transportation kept you from medical appointments or from getting medications?: No In the past 12 months, has lack of transportation kept you from meetings, work, or from getting things needed for daily living?: No Resource referrals recommended: Neuropsychology  Discharge Planning Living Arrangements: Alone Support Systems: Friends/neighbors Type of Residence: Private residence Insurance Resources: Media planner (specify) Herbalist) Financial Screen Referred: No Living Expenses: Banker Management: Patient Does the patient have any problems obtaining your medications?: No Home Management: pt managed all home care needs Patient/Family Preliminary Plans: no changes Care Coordinator Barriers to Discharge: Decreased caregiver support, Lack of/limited family support, Insurance for SNF coverage Care Coordinator Anticipated Follow Up Needs: HH/OP Expected length of stay: 7-10 days  Clinical Impression Updated assessment.   SW met with pt in room at bedside to introduce self, explain role, and discuss discharge process. Pt is not a Cytogeneticist. No HCPOA. No DME.  Benyamin Jeff A Merrit Waugh 03/08/2023, 1:55 PM

## 2023-03-08 NOTE — Progress Notes (Signed)
Met with patient to review current situation, team conference and plan of care. Reviewed medications and dietary recommendations. Reviewed bowel and bladder. Reinforced strict I and O's. Discussed about pain and sternal precautions. Continue to follow along to discharge to provide educational need to facilitate preparation for discharge.

## 2023-03-08 NOTE — Progress Notes (Addendum)
Patient ID: Jerry [name], male   DOB: [birth year], 58 y.o.   MRN: [id number]   SW met with pt in room to discuss ELOS, and transportation home. Reports his friend [name] can only pick him up on Friday as she works a half day and off at 12:30pm (works in [place]). States during the week she works a normal 8hr shift. SW shared will discuss with medical team, and will share if discharge date is before Friday.   *Medical team reports discharge on Tuesday.   SW met with pt in room to discuss discharge likely on Tuesday and amenable to late discharge to allow friend to pick up. He will call his friend [name] and SW will follow-up on Monday.   Cecile Sheerer, MSW, LCSW Office: [phone] Cell: [phone] Fax: [phone]

## 2023-03-08 NOTE — Progress Notes (Signed)
Physical Therapy Session Note  Patient Details  Name: Jerry [name] MRN: [id number] Date of Birth: 02-Jun-1965  Today's Date: 03/08/2023 PT Individual Time: 0902-0959 PT Individual Time Calculation (min): 57 min   Short Term Goals: Week 1:  PT Short Term Goal 1 (Week 1): STGs = LTGs  Skilled Therapeutic Interventions/Progress Updates:     Pt received seated at EOB and agrees to therapy. No complaint of pain. Pt performs it to stand from elevated bed with CGA/minA and cues for anterior weight shifting and initiation. Pt ambulates x15' to toilet with CGA and cues for positioning. Following, pt ambulates to sink to wash hands and brush teeth in standing for endurance training. Pt then ambulates to gym with CGA and cues for increasing stride length and gait speed to improve balance. Pt completes Nustep for endurance training. Pt completes with only lower extremities to focus benefits on lower body. Pt completes x10:00 at workload of 4 with cues for completing full available ROM to maximize benefits. Following rest break, pt ambulates to mat table with CGA. Pt then completes alternating step ups onto 6" platform without AD or upper extremity support to challenge balance and increase strengthening through legs. Pt completes sets of x10 with minA at hips and trunk for stability. Pt completes x3 sets total. WC transport back to room. Left seated with alarm intact and all needs within reach.   Therapy Documentation Precautions:  Precautions Precautions: Fall, Sternal Precaution Booklet Issued: No Precaution Comments: sternal precautions Restrictions Weight Bearing Restrictions Per Provider Order: No Other Position/Activity Restrictions: sternal precautions    Therapy/Group: Individual Therapy  Beau Fanny, PT, DPT 03/08/2023, 4:47 PM

## 2023-03-08 NOTE — Progress Notes (Signed)
PROGRESS NOTE   Subjective/Complaints:  No events overnight.  Does have some mild pain over his sternal incision site this a.m., thinks it is from working with therapies yesterday.  Does not want any medication changed or added for this.  .  Continuing to have some poor sleep, but states this is at his baseline, still having some trouble getting his channels on the TV at nighttime. Vitals stable BUN/creatinine improved this a.m.  ROS: Denies fevers, chills, N/V, abdominal pain, constipation, diarrhea, SOB, cough, chest pain, new weakness or paraesthesias.   + Difficulty sleeping--at baseline +chest pain - local, stable  Objective:   No results found. Recent Labs    03/06/23 0525 03/07/23 0601  WBC 9.2 10.3  HGB 11.6* 12.0*  HCT 36.5* 37.6*  PLT 263 252   Recent Labs    03/07/23 0601 03/08/23 0625  NA 140 140  K 4.1 3.9  CL 105 106  CO2 24 23  GLUCOSE 87 83  BUN 20 15  CREATININE 1.44* 1.23  CALCIUM 9.0 8.8*    Intake/Output Summary (Last 24 hours) at 03/08/2023 0959 Last data filed at 03/08/2023 0800 Gross per 24 hour  Intake 460 ml  Output 400 ml  Net 60 ml        Physical Exam: Vital Signs Blood pressure (!) 145/73, pulse 68, temperature 98.9 F (37.2 C), temperature source Oral, resp. rate 17, height 5\' 10"  (1.778 m), weight 94.7 kg, SpO2 98%.    Constitutional: No apparent distress.  Sitting in bedside chair. HENT:  Atraumatic; prior craniotomy well healed.  Eyes: PERRLA. EOMI. Visual fields grossly intact.  Cardiovascular: RRR, no murmurs/rub/gallops. No Edema. Peripheral pulses 2+  Respiratory: CTAB. No rales, rhonchi, or wheezing. On RA.  Abdomen: + bowel sounds, normoactive. No distention or tenderness.  Psych: Appropriate mood, flat affect, occassionally tangential but appropriate.   Skin: Sternal incision dermabonded, c/d/I, well approximated chronic appearing skin discolorations on  bilateral lower extremities  MSK:      No apparent deformity. Full AROM--limited somewhat by sternal precautions       Neurologic exam:  Cognition: AAO to person, place, time and event.  Follows all simple commands + Mild impulsivity, poor attention. Insight: Good  insight into current condition.   Sensation: To light touch intact in BL UEs and LEs  Reflexes: 2+ in BL UE and LEs. Negative Hoffman's and babinski signs bilaterally.  CN: 2-12 grossly intact.  Coordination: No apparent tremors. No ataxia on FTN, HTS bilaterally.  Spasticity: MAS 0 in all extremities.  Strength: 5 out of 5 throughout  - unchanged 1/31   Assessment/Plan: 1. Functional deficits which require 3+ hours per day of interdisciplinary therapy in a comprehensive inpatient rehab setting. Physiatrist is providing close team supervision and 24 hour management of active medical problems listed below. Physiatrist and rehab team continue to assess barriers to discharge/monitor patient progress toward functional and medical goals  Care Tool:  Bathing    Body parts bathed by patient: Right arm, Left upper leg, Left arm, Right lower leg, Abdomen, Left lower leg, Front perineal area, Face, Buttocks, Right upper leg, Chest         Bathing assist  Assist Level: Minimal Assistance - Patient > 75%     Upper Body Dressing/Undressing Upper body dressing   What is the patient wearing?: Pull over shirt    Upper body assist Assist Level: Minimal Assistance - Patient > 75%    Lower Body Dressing/Undressing Lower body dressing      What is the patient wearing?: Underwear/pull up, Pants     Lower body assist Assist for lower body dressing: Minimal Assistance - Patient > 75%     Toileting Toileting    Toileting assist Assist for toileting: Minimal Assistance - Patient > 75%     Transfers Chair/bed transfer  Transfers assist     Chair/bed transfer assist level: Minimal Assistance - Patient > 75%      Locomotion Ambulation   Ambulation assist      Assist level: Minimal Assistance - Patient > 75% Assistive device: No Device Max distance: 150'   Walk 10 feet activity   Assist     Assist level: Minimal Assistance - Patient > 75% Assistive device: No Device   Walk 50 feet activity   Assist    Assist level: Minimal Assistance - Patient > 75% Assistive device: No Device    Walk 150 feet activity   Assist    Assist level: Maximal Assistance - Patient 25 - 49% Assistive device: No Device    Walk 10 feet on uneven surface  activity   Assist     Assist level: Minimal Assistance - Patient > 75%     Wheelchair     Assist Is the patient using a wheelchair?: Yes Type of Wheelchair: Manual    Wheelchair assist level: Dependent - Patient 0%      Wheelchair 50 feet with 2 turns activity    Assist        Assist Level: Dependent - Patient 0%   Wheelchair 150 feet activity     Assist      Assist Level: Dependent - Patient 0%   Blood pressure (!) 145/73, pulse 68, temperature 98.9 F (37.2 C), temperature source Oral, resp. rate 17, height 5\' 10"  (1.778 m), weight 94.7 kg, SpO2 98%. Medical Problem List and Plan: 1. Functional deficits secondary to debility related AS and subsequent AVR, ultimately pericardial window for large pericardial effusion             -patient may  shower             -ELOS/Goals: 7-10 days, mod I goals             -sternal precautions  -Stable to continue CIR  2.  Antithrombotics: -DVT/anticoagulation:  Pharmaceutical: Lovenox added             -antiplatelet therapy:  ASA 3. Pain Management:  Oxycodone prn for severe pain and ultram prn for moderate pain.   - 1/30: used oxycodone 1x; DC  4. Mood/Behavior/Sleep: LCSW to follow for evaluation and support.              -antipsychotic agents: N/A  1-29: Mild sleep deficits due to difficulty with television; encourage patient to inform charge nurse to TV can get  fixed.  Has melatonin 5 mg as needed available  1/31: Sleep remains poor, but this is patient's baseline per his report.  5. Neuropsych/cognition: This patient is capable of making decisions on his own behalf. 6. Skin/Wound Care:  Monitor incisions for healing.  Healing well. 7. Fluids/Electrolytes/Nutrition: Strict I/O. Refusing ensure --Will  change to prosource to promote  wound healing -1-2 9: AM labs appropriate.  8. A fib: Has been tapered to amiodarone 200 mg bid with metoprolol 12.5 mg bid.  Monitor for symptoms with increase in activity             -heart rate controlled and rhythm controlled      03/08/2023    5:00 AM 03/08/2023    4:30 AM 03/07/2023    6:42 PM  Vitals with BMI  Weight 208 lbs 13 oz    BMI 29.96    Systolic  145 114  Diastolic  73 52  Pulse  68 73     9. H/o Seizure d/o: Continue Dilantin  at bedtime and Depakote bid--last was 15 years ago             --Followed by  [facility]   10. ABLA: On iron supplement.   -Improved from 9.6-11 on admission  11. AKI: Baseline Scr-1.15. Has been improving with SCr down to 1.20.  -1-28: 1.25 today, BUN unchanged, encourage p.o. fluids and monitor weekly  1/30: Cr up to 1.44; encourage PO fluids - did do better today so will repeat 1/31    1/21: Cr improved  12. Constipation: On Dulcolax tab with Senna but refusing laxative every other day -will simplify to Senna S -2 tabs daily.  - LBM 1/31       LOS: 3 days A FACE TO FACE EVALUATION WAS PERFORMED  Angelina Sheriff 03/08/2023, 9:59 AM

## 2023-03-09 DIAGNOSIS — Z9889 Other specified postprocedural states: Secondary | ICD-10-CM

## 2023-03-09 DIAGNOSIS — R5381 Other malaise: Secondary | ICD-10-CM | POA: Diagnosis not present

## 2023-03-09 DIAGNOSIS — Z8679 Personal history of other diseases of the circulatory system: Secondary | ICD-10-CM | POA: Diagnosis not present

## 2023-03-09 NOTE — Progress Notes (Signed)
PROGRESS NOTE   Subjective/Complaints:  Walked without walker from PT gym  No c/o today   ROS: no CP, SOB, N/V/D  Objective:   No results found. Recent Labs    03/07/23 0601  WBC 10.3  HGB 12.0*  HCT 37.6*  PLT 252   Recent Labs    03/07/23 0601 03/08/23 0625  NA 140 140  K 4.1 3.9  CL 105 106  CO2 24 23  GLUCOSE 87 83  BUN 20 15  CREATININE 1.44* 1.23  CALCIUM 9.0 8.8*    Intake/Output Summary (Last 24 hours) at 03/09/2023 1122 Last data filed at 03/09/2023 0911 Gross per 24 hour  Intake 596 ml  Output 820 ml  Net -224 ml        Physical Exam: Vital Signs Blood pressure 132/79, pulse 67, temperature 97.7 F (36.5 C), resp. rate 18, height 5\' 10"  (1.778 m), weight 94.8 kg, SpO2 100%.  General: No acute distress Mood and affect are appropriate Heart: Regular rate and rhythm no rubs murmurs or extra sounds Lungs: Clear to auscultation, breathing unlabored, no rales or wheezes Abdomen: Positive bowel sounds, soft nontender to palpation, nondistended Extremities: No clubbing, cyanosis, or edema Skin: No evidence of breakdown, no evidence of rash    MSK:      No apparent deformity. Full AROM--limited somewhat by sternal precautions       Neurologic exam:  Cognition: AAO to person, place, time and event.  Follows all simple commands + Mild impulsivity, poor attention. Insight: Good  insight into current condition.   Sensation: To light touch intact in BL UEs and LEs  Reflexes: 2+ in BL UE and LEs. Negative Hoffman's and babinski signs bilaterally.  CN: 2-12 grossly intact.  Coordination: No apparent tremors. No ataxia on FTN, HTS bilaterally.  Spasticity: MAS 0 in all extremities.  Strength: 5 out of 5 throughout  - unchanged 1/31   Assessment/Plan: 1. Functional deficits which require 3+ hours per day of interdisciplinary therapy in a comprehensive inpatient rehab setting. Physiatrist is  providing close team supervision and 24 hour management of active medical problems listed below. Physiatrist and rehab team continue to assess barriers to discharge/monitor patient progress toward functional and medical goals  Care Tool:  Bathing    Body parts bathed by patient: Right arm, Left upper leg, Left arm, Right lower leg, Abdomen, Left lower leg, Front perineal area, Face, Buttocks, Right upper leg, Chest         Bathing assist Assist Level: Minimal Assistance - Patient > 75%     Upper Body Dressing/Undressing Upper body dressing   What is the patient wearing?: Pull over shirt    Upper body assist Assist Level: Minimal Assistance - Patient > 75%    Lower Body Dressing/Undressing Lower body dressing      What is the patient wearing?: Underwear/pull up, Pants     Lower body assist Assist for lower body dressing: Minimal Assistance - Patient > 75%     Toileting Toileting    Toileting assist Assist for toileting: Minimal Assistance - Patient > 75%     Transfers Chair/bed transfer  Transfers assist     Chair/bed transfer  assist level: Minimal Assistance - Patient > 75%     Locomotion Ambulation   Ambulation assist      Assist level: Contact Guard/Touching assist Assistive device: Rollator Max distance: 300+   Walk 10 feet activity   Assist     Assist level: Contact Guard/Touching assist Assistive device: Rollator   Walk 50 feet activity   Assist    Assist level: Contact Guard/Touching assist Assistive device: Rollator    Walk 150 feet activity   Assist    Assist level: Contact Guard/Touching assist Assistive device: Rollator    Walk 10 feet on uneven surface  activity   Assist     Assist level: Contact Guard/Touching assist Assistive device: Rollator   Wheelchair     Assist Is the patient using a wheelchair?: Yes Type of Wheelchair: Manual    Wheelchair assist level: Dependent - Patient 0%      Wheelchair  50 feet with 2 turns activity    Assist        Assist Level: Dependent - Patient 0%   Wheelchair 150 feet activity     Assist      Assist Level: Dependent - Patient 0%   Blood pressure 132/79, pulse 67, temperature 97.7 F (36.5 C), resp. rate 18, height 5\' 10"  (1.778 m), weight 94.8 kg, SpO2 100%. Medical Problem List and Plan: 1. Functional deficits secondary to debility related AS and subsequent AVR, ultimately pericardial window for large pericardial effusion             -patient may  shower             -ELOS/Goals:2/4, mod I goals, pt states fried cannot get him until 530 on Tues would prefer d/c on 2/7 when his friend gets off at noon.  DIscussed that d/c cannot be held for that reason              -sternal precautions  -Stable to continue CIR  2.  Antithrombotics: -DVT/anticoagulation:  Pharmaceutical: Lovenox added             -antiplatelet therapy:  ASA 3. Pain Management:  Oxycodone prn for severe pain and ultram prn for moderate pain.   - 1/30: used oxycodone 1x; DC  4. Mood/Behavior/Sleep: LCSW to follow for evaluation and support.              -antipsychotic agents: N/A  1-29: Mild sleep deficits due to difficulty with television; encourage patient to inform charge nurse to TV can get fixed.  Has melatonin 5 mg as needed available  1/31: Sleep remains poor, but this is patient's baseline per his report.  5. Neuropsych/cognition: This patient is capable of making decisions on his own behalf. 6. Skin/Wound Care:  Monitor incisions for healing.  Healing well. 7. Fluids/Electrolytes/Nutrition: Strict I/O. Refusing ensure --Will  change to prosource to promote wound healing -1-2 9: AM labs appropriate.  8. A fib: Has been tapered to amiodarone 200 mg bid with metoprolol 12.5 mg bid.  Monitor for symptoms with increase in activity             -heart rate controlled and rhythm controlled      03/09/2023    9:07 AM 03/09/2023    4:56 AM 03/08/2023    8:39 PM   Vitals with BMI  Weight 209 lbs    BMI 29.99    Systolic  132 130  Diastolic  79 64  Pulse  67 70     9. H/o Seizure  d/o: Continue Dilantin  at bedtime and Depakote bid--last was 15 years ago             --Followed by  [facility]   10. ABLA: On iron supplement.   -Improved from 9.6-11 on admission  11. AKI: Baseline Scr-1.15. Has been improving with SCr down to 1.20.      Latest Ref Rng & Units 03/08/2023    6:25 AM 03/07/2023    6:01 AM 03/06/2023    5:25 AM  BMP  Glucose 70 - 99 mg/dL 83  87  94   BUN 6 - 20 mg/dL 15  20  15    Creatinine 0.61 - 1.24 mg/dL 4.09  8.11  9.14   Sodium 135 - 145 mmol/L 140  140  140   Potassium 3.5 - 5.1 mmol/L 3.9  4.1  4.3   Chloride 98 - 111 mmol/L 106  105  109   CO2 22 - 32 mmol/L 23  24  22    Calcium 8.9 - 10.3 mg/dL 8.8  9.0  8.9      12. Constipation: On Dulcolax tab with Senna but refusing laxative every other day -will simplify to Senna S -2 tabs daily.  - LBM 1/31       LOS: 4 days A FACE TO FACE EVALUATION WAS PERFORMED  Erick Colace 03/09/2023, 11:22 AM

## 2023-03-09 NOTE — Progress Notes (Signed)
Occupational Therapy Session Note  Patient Details  Name: ALHASSAN EVERINGHAM MRN: [id number] Date of Birth: 14-Nov-1965  Today's Date: 03/09/2023 OT Individual Time: 1102-1157 OT Individual Time Calculation (min): 55 min    Short Term Goals: Week 1:  OT Short Term Goal 1 (Week 1): STG= LTG d/t ELOS  Skilled Therapeutic Interventions/Progress Updates:    Patient received seated in wheelchair reporting - "my butt is numb."  Patient declined bathing reporting - "I am fine," requesting to walk instead.  Patient stood at sink to brush his teeth, then walked with rollator and close supervision to ADL apartment for furniture transfers, walk in shower transfer while maintaining sternal precautions.  Patient walked back to room without rollator and occasional contact guard (when head turns right)  Cueing provided to patient to maintain eyes forward versus on the ground when walking in community environment to prevent obstacles.  Patient left seated at edge of bed at end of session. Bed alarm engaged and call bell/ personal items in reach.   Therapy Documentation Precautions:  Precautions Precautions: Fall, Sternal Precaution Booklet Issued: No Precaution Comments: sternal precautions Restrictions Weight Bearing Restrictions Per Provider Order: No RUE Weight Bearing Per Provider Order: Non weight bearing LUE Weight Bearing Per Provider Order: Non weight bearing Other Position/Activity Restrictions: sternal precautions  Pain: Pain Assessment Pain Scale: 0-10 Pain Score: 0-No pain     Therapy/Group: Individual Therapy  Collier Salina 03/09/2023, 12:32 PM

## 2023-03-09 NOTE — Progress Notes (Signed)
Occupational Therapy Session Note  Patient Details  Name: Jerry [name] MRN: [id number] Date of Birth: 05-03-1965  Today's Date: 03/09/2023 OT Individual Time: 7829-5621 OT Individual Time Calculation (min): 30 min  and Today's Date: 03/09/2023 OT Missed Time: 15 Minutes Missed Time Reason: Other (comment) (Missed minutes due to delay in care.)   Short Term Goals: Week 1:  OT Short Term Goal 1 (Week 1): STG= LTG d/t ELOS  Skilled Therapeutic Interventions/Progress Updates:  Pt greeted resting in bed for skilled OT session with focus on functional mobility, activity tolerance, and functional cognition. Pt with no reports of pain. Sit<>stands and ambulatory transfers into/out of bathroom and from room>day room with strong adherence to sternal precautions, personal preference for use of RW during standing toileting, rollator utilized for remainder of session. In day room, pt tasked with x2 functional mobility activities at room-level distance, dual task challenge added by asking patient to retrieve cones/cards in a specific order as dictated by therapist. Pt requires min-mod cuing for recall of instructions provided (ie. "Remember we are only picking up the even cards"). Pt completes at close supervision level with no AD, total time of activity round lasting >5 mins. Direct handoff to PT. Pt continues to be appropriate for skilled OT intervention to promote further functional independence in ADLs/IADLs.   Therapy Documentation Precautions:  Precautions Precautions: Fall, Sternal Precaution Booklet Issued: No Precaution Comments: sternal precautions Restrictions Weight Bearing Restrictions Per Provider Order: No Other Position/Activity Restrictions: sternal precautions   Therapy/Group: Individual Therapy  Lou Cal, OTR/L, MSOT  03/09/2023, 6:05 AM

## 2023-03-09 NOTE — Progress Notes (Signed)
Physical Therapy Session Note  Patient Details  Name: Jerry [name] MRN: [id number] Date of Birth: 1965-11-08  Today's Date: 03/09/2023 PT Individual Time: 4098-1191 PT Individual Time Calculation (min): 55 min   Short Term Goals: Week 1:  PT Short Term Goal 1 (Week 1): STGs = LTGs  Skilled Therapeutic Interventions/Progress Updates:    Pt received as hand-off from OT and pt agreeable to continue with this therapy session. Gait training back to room using rollator with supervision - reciprocal stepping pattern with adequate gait speed - L LE slightly externally rotated which pt reports is his baseline. Sitting EOB doffed gripper socks and donned shoes set-up assist.  Notified nurse of plan to go outside during therapy.  Gait training ~245ft to outside using rollator with supervision including navigating on/off elevator - pt requires cuing to keep rollator with him when going to push elevator buttons (starts to park it to the side).  Participated in dynamic gait training outside, no AD, >124ft focusing on higher level dynamic gait challenges of ambulating over unlevel paved/brick pathways - CGA for steadying/safety - slow gait speed with slight instability and min reciprocal stepping pattern.  Outdoor Water engineer ascending/descending 8 steps x2 (6" height) using L UE support on HR with CGA for steadying - step-to pattern on descent and reciprocal pattern on ascent - pt reports increased difficulty with eccentric control on descent and notice muscle weakness with this.   Discussed fall risk safety at home and recommendation to ensure he has a key for someone to access his home if he falls inside and that it is not safe for him to get up off the floor alone. Educated on recommendation to call 911 in event of a fall and to keep his phone on him at all times.  Pt reporting fatigue and requesting to return to CIR floor. Gait training back to unit using rollator with supervision as  described above.  Initiated partial forward lunge training to target B LE strengthening with pt reporting fear of falling and significant fatigue with this activity so discontinued after ~3 reps with min A for balance.  Pt suddenly becomes tearful and crying, but wishes not to talk about it and instead redirects conversation to something happy. Therapist provides emotional support and redirection of conversation based on pt's lead. Anticipate he may have been emotional with how challenging the partial lunge was.  Pt reporting significant fatigue this afternoon and requesting to return to room. Gait training back to room using rollator with supervision. Sit>supine mod-I. Pt left supine in bed with needs in reach and bed alarm on.   Therapy Documentation Precautions:  Precautions Precautions: Fall, Sternal Precaution Booklet Issued: No Precaution Comments: sternal precautions Restrictions Weight Bearing Restrictions Per Provider Order: No RUE Weight Bearing Per Provider Order: Non weight bearing LUE Weight Bearing Per Provider Order: Non weight bearing Other Position/Activity Restrictions: sternal precautions  Pain:  Reports some R ankle DF muscle discomfort with attempt at doing partial lunges, which pt reports is likely due to muscle weakness from decreased use.    Therapy/Group: Individual Therapy  Ginny Forth , PT, DPT, NCS, CSRS 03/09/2023, 3:21 PM

## 2023-03-09 NOTE — Progress Notes (Signed)
Physical Therapy Session Note  Patient Details  Name: Jerry [name] MRN: [id number] Date of Birth: 1966-01-03  Today's Date: 03/09/2023 PT Individual Time: 1002-1035 PT Individual Time Calculation (min): 33 min   Short Term Goals: Week 1:  PT Short Term Goal 1 (Week 1): STGs = LTGs  Skilled Therapeutic Interventions/Progress Updates:     Pt received sitting in wheelchair. Pt reports slight discomfort rated around 3/10 and stated he had asked for pain meds 30 min prior to PT tx. When PT stated she could get medication for pt, pt declined and said he would ask for medicine after tx completed. Pt agreeable to PT tx.  GT: GT with rollator 2x194 ft with PT S; GT without rollator 322 ft with bil toeing out L > R with PT light CGA. PT with verbal cues for both bouts of gait for posture and stride with pt able to self correct. Pt concerned with falling at home but unable to state why.   Therex: NuStep L5 Les only x 8 min with PT verbal cues for quad contraction; standing tolerance activities for functional strengthening performed without AD without balance deficit or need to sit.  Pt returned to room to wheelchair. Belt alarm placed on. Pt without complaints of pain after tx. PT advised him to ask for pain meds if needed due to him having requested them prior.   Therapy Documentation Precautions:  Precautions Precautions: Fall, Sternal Precaution Booklet Issued: No Precaution Comments: sternal precautions Restrictions Weight Bearing Restrictions Per Provider Order: No Other Position/Activity Restrictions: sternal precautions   Therapy/Group: Individual Therapy  Luna Fuse 03/09/2023, 7:19 AM

## 2023-03-10 DIAGNOSIS — Z9889 Other specified postprocedural states: Secondary | ICD-10-CM | POA: Diagnosis not present

## 2023-03-10 DIAGNOSIS — R5381 Other malaise: Secondary | ICD-10-CM | POA: Diagnosis not present

## 2023-03-10 DIAGNOSIS — Z8679 Personal history of other diseases of the circulatory system: Secondary | ICD-10-CM | POA: Diagnosis not present

## 2023-03-10 NOTE — Progress Notes (Signed)
PROGRESS NOTE   Subjective/Complaints:  Has arranged for another ride for Tuesday No pain c/o in chest area except with ext pressure   ROS: no CP, SOB, N/V/D  Objective:   No results found. No results for input(s): "WBC", "HGB", "HCT", "PLT" in the last 72 hours.  Recent Labs    03/08/23 0625  NA 140  K 3.9  CL 106  CO2 23  GLUCOSE 83  BUN 15  CREATININE 1.23  CALCIUM 8.8*    Intake/Output Summary (Last 24 hours) at 03/10/2023 1226 Last data filed at 03/10/2023 0816 Gross per 24 hour  Intake 354 ml  Output 100 ml  Net 254 ml        Physical Exam: Vital Signs Blood pressure 124/73, pulse 68, temperature 98.3 F (36.8 C), resp. rate 16, height 5\' 10"  (1.778 m), weight 94.9 kg, SpO2 99%.  General: No acute distress Mood and affect are appropriate Heart: Regular rate and rhythm no rubs murmurs or extra sounds Lungs: Clear to auscultation, breathing unlabored, no rales or wheezes Abdomen: Positive bowel sounds, soft nontender to palpation, nondistended Extremities: No clubbing, cyanosis, or edema Skin: No evidence of breakdown, no evidence of rash  MSK:      No apparent deformity. Full AROM--l       Neurologic exam:  Cognition: AAO to person, place, time and event.  Follows all simple commands    Assessment/Plan: 1. Functional deficits which require 3+ hours per day of interdisciplinary therapy in a comprehensive inpatient rehab setting. Physiatrist is providing close team supervision and 24 hour management of active medical problems listed below. Physiatrist and rehab team continue to assess barriers to discharge/monitor patient progress toward functional and medical goals  Care Tool:  Bathing    Body parts bathed by patient: Right arm, Left upper leg, Left arm, Right lower leg, Abdomen, Left lower leg, Front perineal area, Face, Buttocks, Right upper leg, Chest         Bathing assist Assist  Level: Minimal Assistance - Patient > 75%     Upper Body Dressing/Undressing Upper body dressing   What is the patient wearing?: Pull over shirt    Upper body assist Assist Level: Minimal Assistance - Patient > 75%    Lower Body Dressing/Undressing Lower body dressing      What is the patient wearing?: Underwear/pull up, Pants     Lower body assist Assist for lower body dressing: Minimal Assistance - Patient > 75%     Toileting Toileting    Toileting assist Assist for toileting: Minimal Assistance - Patient > 75%     Transfers Chair/bed transfer  Transfers assist     Chair/bed transfer assist level: Minimal Assistance - Patient > 75%     Locomotion Ambulation   Ambulation assist      Assist level: Contact Guard/Touching assist Assistive device: Rollator Max distance: 300+   Walk 10 feet activity   Assist     Assist level: Contact Guard/Touching assist Assistive device: Rollator   Walk 50 feet activity   Assist    Assist level: Contact Guard/Touching assist Assistive device: Rollator    Walk 150 feet activity   Assist  Assist level: Contact Guard/Touching assist Assistive device: Rollator    Walk 10 feet on uneven surface  activity   Assist     Assist level: Contact Guard/Touching assist Assistive device: Rollator   Wheelchair     Assist Is the patient using a wheelchair?: Yes Type of Wheelchair: Manual    Wheelchair assist level: Dependent - Patient 0%      Wheelchair 50 feet with 2 turns activity    Assist        Assist Level: Dependent - Patient 0%   Wheelchair 150 feet activity     Assist      Assist Level: Dependent - Patient 0%   Blood pressure 124/73, pulse 68, temperature 98.3 F (36.8 C), resp. rate 16, height 5\' 10"  (1.778 m), weight 94.9 kg, SpO2 99%. Medical Problem List and Plan: 1. Functional deficits secondary to debility related AS and subsequent AVR, ultimately pericardial  window for large pericardial effusion             -patient may  shower             -ELOS/Goals:2/4, mod I goals,             -sternal precautions  -Stable to continue CIR  2.  Antithrombotics: -DVT/anticoagulation:  Pharmaceutical: Lovenox added             -antiplatelet therapy:  ASA 3. Pain Management:  Oxycodone prn for severe pain and ultram prn for moderate pain.   - 1/30: used oxycodone 1x; DC  4. Mood/Behavior/Sleep: LCSW to follow for evaluation and support.              -antipsychotic agents: N/A  1-29: Mild sleep deficits due to difficulty with television; encourage patient to inform charge nurse to TV can get fixed.  Has melatonin 5 mg as needed available  1/31: Sleep remains poor, but this is patient's baseline per his report.  5. Neuropsych/cognition: This patient is capable of making decisions on his own behalf. 6. Skin/Wound Care:  Monitor incisions for healing.  Healing well. 7. Fluids/Electrolytes/Nutrition: Strict I/O. Refusing ensure --Will  change to prosource to promote wound healing -1-2 9: AM labs appropriate.  8. A fib: Has been tapered to amiodarone 200 mg bid with metoprolol 12.5 mg bid.  Monitor for symptoms with increase in activity             -heart rate controlled and rhythm controlled      03/10/2023    5:13 AM 03/10/2023    5:00 AM 03/09/2023    8:01 PM  Vitals with BMI  Weight  209 lbs 3 oz   BMI  30.02   Systolic 124  115  Diastolic 73  73  Pulse 68  72     9. H/o Seizure d/o: Continue Dilantin  at bedtime and Depakote bid--last was 15 years ago             --Followed by  [facility]   10. ABLA: On iron supplement.   -Improved from 9.6-11 on admission  11. AKI: Baseline Scr-1.15. Has been improving with SCr down to 1.20.      Latest Ref Rng & Units 03/08/2023    6:25 AM 03/07/2023    6:01 AM 03/06/2023    5:25 AM  BMP  Glucose 70 - 99 mg/dL 83  87  94   BUN 6 - 20 mg/dL 15  20  15    Creatinine 0.61 - 1.24 mg/dL 8.11  9.14  1.25   Sodium 135 -  145 mmol/L 140  140  140   Potassium 3.5 - 5.1 mmol/L 3.9  4.1  4.3   Chloride 98 - 111 mmol/L 106  105  109   CO2 22 - 32 mmol/L 23  24  22    Calcium 8.9 - 10.3 mg/dL 8.8  9.0  8.9      12. Constipation: On Dulcolax tab with Senna but refusing laxative every other day -will simplify to Senna S -2 tabs daily.  - LBM 1/31       LOS: 5 days A FACE TO FACE EVALUATION WAS PERFORMED  Erick Colace 03/10/2023, 12:26 PM

## 2023-03-10 NOTE — Discharge Summary (Signed)
 Physician Discharge Summary  Patient ID: GRESHAM CAETANO MRN: [id number] DOB/AGE: 06-01-1965 58 y.o.  Admit date: 03/05/2023 Discharge date: 03/12/2023  Discharge Diagnoses:  Principal Problem:   Debility DVT prophylaxis Pericardial effusion status post pericardial window Pain management A-fib History of seizure related to TBI Acute blood loss anemia AKI Constipation Aortic stenosis Vasculitis  Discharged Condition: Stable  Significant Diagnostic Studies: DG Chest 2 View Result Date: 02/22/2023 CLINICAL DATA:  Chest tube removal. EXAM: CHEST - 2 VIEW COMPARISON:  02/21/2023. FINDINGS: Heart is enlarged and the mediastinal contour stable. A prosthetic cardiac valve and sternotomy wires are noted. There is atherosclerotic calcification of the aorta. Lung volumes are low with atelectasis and small effusion at the left lung base. No pneumothorax is seen. A right internal jugular central venous catheter appear stable. No acute osseous abnormality. IMPRESSION: 1. Small residual left pleural effusion with basilar atelectasis. No pneumothorax. 2. Cardiomegaly. Electronically Signed   By: Leita Birmingham M.D.   On: 02/22/2023 19:47   DG CHEST PORT 1 VIEW Result Date: 02/21/2023 CLINICAL DATA:  Chest tube in place EXAM: PORTABLE CHEST 1 VIEW COMPARISON:  02/21/2023 FINDINGS: Postoperative changes in the mediastinum. Shallow inspiration. Cardiac enlargement. Atelectasis in the lung bases with probable small effusions. A left chest tube is in place. No visible pneumothorax. Right central venous catheter with tip over the low SVC region. IMPRESSION: Shallow inspiration with probable small bilateral pleural effusions and basilar atelectasis. Cardiac enlargement. Appliances appear in satisfactory position. Electronically Signed   By: Elsie Gravely M.D.   On: 02/21/2023 12:16   DG Chest Port 1 View Result Date: 02/21/2023 CLINICAL DATA:  [id number] Chest tube in place [id number] EXAM: PORTABLE CHEST 1 VIEW  COMPARISON:  02/20/2023 FINDINGS: Stable positioning of right IJ central venous catheter and left chest tube. Stable cardiomegaly status post sternotomy and cardiac valve replacement. Low lung volumes with mild bibasilar atelectasis. Probable small right pleural effusion. No pneumothorax. IMPRESSION: Low lung volumes with mild bibasilar atelectasis. Probable small right pleural effusion. Little interval change from prior. Electronically Signed   By: Mabel Converse D.O.   On: 02/21/2023 09:43   DG Chest Port 1 View Result Date: 02/20/2023 CLINICAL DATA:  Chest tube in place EXAM: PORTABLE CHEST 1 VIEW COMPARISON:  02/20/2023 FINDINGS: Postoperative changes in the mediastinum. Right central venous catheter with tip over the low SVC region. Left chest tube present. No significant pneumothorax identified. Cardiac enlargement. Shallow inspiration with linear atelectasis in the lung bases. Small right pleural effusion appears improved since prior study. IMPRESSION: Appliances appear in satisfactory position. Shallow inspiration with atelectasis in the bases. Small right pleural effusion appears decreased. Electronically Signed   By: Elsie Gravely M.D.   On: 02/20/2023 17:55   ECHO INTRAOPERATIVE TEE Result Date: 02/20/2023  *INTRAOPERATIVE TRANSESOPHAGEAL REPORT *  Patient Name:   BUBBER ROTHERT Date of Exam: 02/20/2023 Medical Rec #:  [id number]        Height:       71.0 in Accession #:    [id number]       Weight:       242.3 lb Date of Birth:  02/10/1965        BSA:          2.29 m Patient Age:    58 years         BP:           127/72 mmHg Patient Gender: M  HR:           76 bpm. Exam Location:  Anesthesiology Transesophogeal exam was perform intraoperatively during surgical procedure. Patient was closely monitored under general anesthesia during the entirety of examination. Indications:     Pericardial effusion Performing Phys: [id number] BRYAN K BARTLE Complications: No known complications during  this procedure. POST-OP IMPRESSIONS _ Left Ventricle: The left ventricle is unchanged from pre-bypass. _ Right Ventricle: The right ventricle appears unchanged from pre-bypass. _ Aorta: The aorta appears unchanged from pre-bypass. _ Left Atrium: The left atrium appears unchanged from pre-bypass. _ Left Atrial Appendage: The left atrial appendage appears unchanged from pre-bypass. _ Aortic Valve: The aortic valve appears unchanged from pre-bypass. _ Mitral Valve: The mitral valve appears unchanged from pre-bypass. _ Tricuspid Valve: The tricuspid valve appears unchanged from pre-bypass. _ Pulmonic Valve: The pulmonic valve appears unchanged from pre-bypass. _ Interatrial Septum: The interatrial septum appears unchanged from pre-bypass. _ Interventricular Septum: The interventricular septum appears unchanged from pre-bypass. _ Comments: Resolution of large pericardial effusion. Left pleural effusion decreased in size. PRE-OP FINDINGS  Left Ventricle: The left ventricle has normal systolic function, with an ejection fraction of 55-60%. The cavity size was decreased. There is mild left ventricular hypertrophy. Right Ventricle: The right ventricle has normal systolic function. The cavity was small. There is no increase in right ventricular wall thickness. Left Atrium: Left atrial size was normal in size. No left atrial/left atrial appendage thrombus was detected. Right Atrium: Right atrial size was normal in size. Interatrial Septum: The interatrial septum was not assessed. Pericardium: A large pericardial effusion is present. The pericardial effusion is circumferential. The pericardial effusion appears to contain focal strands. There is a small pleural effusion in the left lateral region. Mitral Valve: The mitral valve is normal in structure. Mitral valve regurgitation is mild by color flow Doppler. There is no evidence of mitral valve vegetation. There is mild thickening present. Tricuspid Valve: The tricuspid valve  was normal in structure. Tricuspid valve regurgitation is mild by color flow Doppler. There is no evidence of tricuspid valve vegetation. Aortic Valve: The aortic valve is tricuspid Aortic valve regurgitation was not visualized by color flow Doppler. There is no stenosis of the aortic valve. There is no evidence of aortic valve vegetation. Pulmonic Valve: The pulmonic valve was normal in structure. Pulmonic valve regurgitation was not assessed by color flow Doppler. Aorta: There is evidence of plaque in the descending aorta; Grade II, measuring 2-49mm in size.  Norleen Pope MD Electronically signed by Norleen Pope MD Signature Date/Time: 02/20/2023/1:18:11 PM    Final    DG CHEST PORT 1 VIEW Result Date: 02/20/2023 CLINICAL DATA:  Central line placement EXAM: PORTABLE CHEST 1 VIEW COMPARISON:  02/19/2023 FINDINGS: Interval placement of right internal jugular approach central venous catheter with distal tip at the level of the superior cavoatrial junction. Interval placement of left chest tube. Stable heart size status post sternotomy and cardiac valve replacement. Low lung volumes with right greater than left bibasilar opacities. No pneumothorax. IMPRESSION: 1. Interval placement of right central venous catheter and left chest tube. No pneumothorax. 2. Low lung volumes with right greater than left bibasilar opacities. Electronically Signed   By: Mabel Converse D.O.   On: 02/20/2023 12:31   DG CHEST PORT 1 VIEW Result Date: 02/19/2023 CLINICAL DATA:  Postop chest EXAM: PORTABLE CHEST 1 VIEW COMPARISON:  X-ray 02/14/2023 and older FINDINGS: Under penetrated underinflated x-ray that is rotated. Overlapping cardiac leads and defibrillator pads. Status post  median sternotomy with enlarged cardiopericardial silhouette with widened mediastinum and tortuous and ectatic aorta. Lung base opacity seen with possible effusions. No edema or pneumothorax. IMPRESSION: Very limited x-ray. Status post median sternotomy with  enlarged cardiopericardial silhouette. Lung base opacities. No pneumothorax. Recommend follow up. Additional x-ray with improved inflation and improved technique could be considered as clinically appropriate as well Electronically Signed   By: Ranell Bring M.D.   On: 02/19/2023 14:26   ECHOCARDIOGRAM COMPLETE Result Date: 02/19/2023    ECHOCARDIOGRAM REPORT   Patient Name:   JOVANI FLURY Date of Exam: 02/19/2023 Medical Rec #:  [id number]        Height:       71.0 in Accession #:    [id number]       Weight:       239.6 lb Date of Birth:  [birth year]        BSA:          2.277 m Patient Age:    58 years         BP:           120/89 mmHg Patient Gender: M                HR:           80 bpm. Exam Location:  Inpatient Procedure: 2D Echo, Limited Color Doppler and Cardiac Doppler Indications:    S/P AVR                 A FIB  History:        Patient has prior history of Echocardiogram examinations, most                 recent 08/24/2022. HLD; Aortic Valve Disease.  Sonographer:    Amy Chionchio Referring Phys: [id number] BRYAN K BARTLE IMPRESSIONS  1. Left ventricular ejection fraction, by estimation, is 50 to 55%. The left ventricle has low normal function. The left ventricle has no regional wall motion abnormalities. There is mild concentric left ventricular hypertrophy. Left ventricular diastolic function could not be evaluated.  2. Right ventricular systolic function is normal. The right ventricular size is small.  3. Large pericardial effusion. The pericardial effusion is circumferential. The right ventricle dose not fully expand. Per report, patient refused echo after clip 17.  4. The mitral valve was not well visualized. No evidence of mitral valve regurgitation.  5. The aortic valve was not well visualized. Aortic valve regurgitation is not visualized. Comparison(s): Effusion is new from TEE. Conclusion(s)/Recommendation(s): If patient becomes amenable to imaging; limited TTE for pericardial effusion would be  reasonable. FINDINGS  Left Ventricle: Left ventricular ejection fraction, by estimation, is 50 to 55%. The left ventricle has low normal function. The left ventricle has no regional wall motion abnormalities. The left ventricular internal cavity size was normal in size. There is mild concentric left ventricular hypertrophy. Left ventricular diastolic function could not be evaluated. Right Ventricle: The right ventricular size is small. No increase in right ventricular wall thickness. Right ventricular systolic function is normal. Left Atrium: Left atrial size was not well visualized. Right Atrium: Right atrial size was not well visualized. Pericardium: A large pericardial effusion is present. The pericardial effusion is circumferential. There is diastolic collapse of the right ventricular free wall. Mitral Valve: The mitral valve was not well visualized. No evidence of mitral valve regurgitation. Tricuspid Valve: The tricuspid valve is not well visualized. Tricuspid valve regurgitation is not demonstrated. Aortic Valve: The aortic  valve was not well visualized. Aortic valve regurgitation is not visualized. Pulmonic Valve: The pulmonic valve was not well visualized. Pulmonic valve regurgitation is not visualized. Aorta: The aortic root and ascending aorta are structurally normal, with no evidence of dilitation. IAS/Shunts: The interatrial septum was not assessed.  LEFT VENTRICLE PLAX 2D LVIDd:         3.10 cm LVIDs:         2.30 cm LV PW:         1.20 cm LV IVS:        1.10 cm LVOT diam:     2.10 cm LVOT Area:     3.46 cm   AORTA Ao Asc diam: 3.40 cm  SHUNTS Systemic Diam: 2.10 cm Stanly Leavens MD Electronically signed by Stanly Leavens MD Signature Date/Time: 02/19/2023/1:34:55 PM    Final    DG Chest 2 View Result Date: 02/14/2023 CLINICAL DATA:  [id number], postoperative check following aortic valvular replacement January 3. EXAM: CHEST - 2 VIEW COMPARISON:  Portable chest 02/10/2018 FINDINGS: There are  intact and midline sternotomy sutures, and a prosthetic aortic valve. The cardiac size is normal. No vascular congestion is seen. The lungs clear with resolution of left perihilar linear atelectasis noted previously. There is mild chronic elevation of the right hemidiaphragm. The sulci are sharp. The mediastinum is normally outlined. No new osseous findings. Extensive thoracic spine bridging enthesopathy of DISH. IMPRESSION: 1. No evidence of acute chest disease 2. Resolution of left perihilar linear atelectasis noted previously. 3. Status post aortic valve replacement. Electronically Signed   By: Francis Quam M.D.   On: 02/14/2023 07:53   DG CHEST PORT 1 VIEW Result Date: 02/11/2023 CLINICAL DATA:  Status post aortic valve replacement. EXAM: PORTABLE CHEST 1 VIEW COMPARISON:  02/10/2023 and older studies. FINDINGS: All lines and tubes have been removed. Cardiac silhouette is normal in size. Stable changes from the recent aortic valve replacement. No mediastinal widening. Mild linear atelectasis at the left lung base and adjacent to the left hilum is stable from the previous day's exam. Lung volumes remain low. No evidence of pneumonia or pulmonary edema. Possible small effusions.  No pneumothorax. IMPRESSION: 1. No acute findings.  No evidence of an operative complication. 2. Mild persistent left lung base atelectasis. Electronically Signed   By: Alm Parkins M.D.   On: 02/11/2023 08:25   DG Chest Port 1 View Result Date: 02/10/2023 CLINICAL DATA:  58 year old male status post aortic valve replacement. EXAM: PORTABLE CHEST 1 VIEW COMPARISON:  Chest x-ray 02/09/2023. FINDINGS: Previously noted Swan-Ganz catheter has been removed. Mediastinal/pericardial drain again noted. Lung volumes are very low. Opacity in the left lung base likely reflective of postoperative subsegmental atelectasis. No definite pleural effusions. No pneumothorax. No evidence of pulmonary edema. Heart size is normal. Upper mediastinal  contours are within normal limits. Status post median sternotomy for aortic valve replacement (a stented bioprosthesis is noted). IMPRESSION: 1. Postoperative changes and support apparatus, as above. 2. Low lung volumes with postoperative subsegmental atelectasis in the left lung base. Electronically Signed   By: Toribio Aye M.D.   On: 02/10/2023 08:27    Labs:  Basic Metabolic Panel: Recent Labs  Lab 03/06/23 0525 03/07/23 0601 03/08/23 0625 03/11/23 0541  NA 140 140 140 141  K 4.3 4.1 3.9 4.5  CL 109 105 106 110  CO2 22 24 23 24   GLUCOSE 94 87 83 83  BUN 15 20 15 13   CREATININE 1.25* 1.44* 1.23 1.14  CALCIUM  8.9  9.0 8.8* 9.0    CBC: Recent Labs  Lab 03/06/23 0525 03/07/23 0601 03/11/23 0541  WBC 9.2 10.3 7.1  NEUTROABS 6.3  --   --   HGB 11.6* 12.0* 12.1*  HCT 36.5* 37.6* 37.6*  MCV 104.3* 105.0* 104.2*  PLT 263 252 186    CBG: Recent Labs  Lab 03/05/23 2055 03/06/23 0618 03/06/23 1208 03/06/23 1655  GLUCAP 103* 101* 89 73   Family history.  Father with diabetes  Brief HPI:   CHANAN DETWILER is a 58 y.o. 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 Sherrine.  Postoperatively he developed hypoxia with acute blood loss anemia.  Amiodarone  added for intraoperative A-fib and noted to have recurrent episodes of atrial flutter versus SVT or Dr. Inocencio was consulted of cardiology services recommended IV amiodarone  bolus with slow taper and change Lopressor  to bisoprolol .  He continued to have tachycardia with burst of PAF repeated echo 1/14 revealed large pericardial effusion.  He was taken to the OR on 1/15 for pericardial window with placement of left pleural chest tube.  EPS recommended DOAC once cleared by surgery and continue amiodarone  for 3 weeks postop.  AKI resolving and Lasix  discontinued.  He  was weaned from oxygen and iron supplement added for acute blood loss anemia.  He continued to be limited by sternal precautions required supervision to minimal assist.  Due to patient decreased functional mobility was admitted for a comprehensive rehab program   Hospital Course: YERACHMIEL SPINNEY was admitted to rehab 03/05/2023 for inpatient therapies to consist of PT, ST and OT at least three hours five days a week. Past admission physiatrist, therapy team and rehab RN have worked together to provide customized collaborative inpatient rehab.  Pertaining to patient's debility related to aortic stenosis subsequent AVR ultimately pericardial window for large pericardial effusion.  Sternal precautions as indicated.  He would follow-up with cardiothoracic surgery.  Lovenox  added for DVT prophylaxis he continued on low-dose aspirin .  Acute blood loss anemia no bleeding episodes and latest hemoglobin of 12.  Atrial fibrillation amiodarone  as dictated with metoprolol .  Cardiac rate controlled follow-up cardiology services.  History of TBI seizure disorder maintained on Dilantin  at bedtime Depakote  twice daily followed by [facility].  AKI stable creatinine improved 1.20.  Bouts of constipation resolved with laxative assistance.   Blood pressures were monitored on TID basis and remained soft and monitored      Rehab course: During patient's stay in rehab weekly team conferences were held to monitor patient's progress, set goals and discuss barriers to discharge. At admission, patient required moderate assist contact-guard 300 feet rolling walker contact-guard sit to stand  Physical exam.  Blood pressure 117/71 pulse 70 temperature 98.2 respiration 23 oxygen saturation 98% room air Constitutional.  No acute distress HEENT.  Old crani scar noted over mid scalp Eyes.  Pupils round and reactive to light no discharge without nystagmus Neck.  Supple nontender no JVD without thyromegaly Cardiac regular rate and rhythm  without any extra sounds or murmur heard Abdomen.  Soft nontender positive bowel sounds without rebound Respiratory effort normal no respiratory distress without wheeze Skin.  Sternal chest incision clean and dry Neurologic.  Alert and oriented x 3.  Memory intact.  Cranial nerve exam unremarkable.  Manual muscle testing bilateral upper extremities 5/5 lower extremities 4/5 proximal to 5/5 distally.  He/She  has had improvement  in activity tolerance, balance, postural control as well as ability to compensate for deficits. He/She has had improvement in functional use RUE/LUE  and RLE/LLE as well as improvement in awareness.  Working with energy conservation techniques.  Ambulating 200 feet to the outside using rollator with supervision.  Participated in dynamic gait training outside no assistive device.  Outdoor stair navigation 8 steps left upper extremity support contact-guard.  He can gather his belongings for activities day living and homemaking.  Sit to stand amatory transfers into and out of the bathroom and from room to day room with strong adherence to sternal precautions.  Full family teaching completed plan discharge to home       Disposition:  Discharge disposition: 06-Home-Health Care Svc        Diet: Heart healthy  Special Instructions: No driving smoking or alcohol  Sternal precautions  Medications at discharge 1.  Tylenol  as needed 2.  Amiodarone  200 mg p.o. twice daily 3.  Aspirin  325 mg p.o. daily 4.  Depakote  1500 mg p.o. twice daily 5.  Lopressor  12.5 mg p.o. twice daily 6.FE Fum Vit C Vit B 12-FA 1 capsule twice daily 7.  Dilantin  200 mg p.o. nightly 8.  Crestor  10 mg p.o. every evening 9.  Tramadol  50 mg p.o. every 6 hours as needed pain 10.  Senokot S2 tablets daily 11.  Vitamin D  1000 units daily   30-35 minutes were spent completing discharge summary and discharge planning    Follow-up Information     Trudy Vaughn FALCON, MD Follow up.   Specialty:  Internal Medicine Why: Call in 1-2 days for post hospital follow up Contact information: [address] [place] [address] [phone]         Lucas Dorise POUR, MD Follow up.   Specialty: Cardiothoracic Surgery Why: Call in 1-2 days for post hospital follow up Contact information: [address] [employer] [address] [place] [address] [phone]         Emeline Search C, DO. Call.   Specialty: Physical Medicine and Rehabilitation Why: As needed Contact information: [address] [place] [address] [phone]                 Signed: Toribio JINNY Pitch 03/12/2023, 4:55 AM

## 2023-03-11 ENCOUNTER — Other Ambulatory Visit (HOSPITAL_COMMUNITY): Payer: Self-pay

## 2023-03-11 DIAGNOSIS — R5381 Other malaise: Secondary | ICD-10-CM | POA: Diagnosis not present

## 2023-03-11 LAB — BASIC METABOLIC PANEL
Anion gap: 7 (ref 5–15)
BUN: 13 mg/dL (ref 6–20)
CO2: 24 mmol/L (ref 22–32)
Calcium: 9 mg/dL (ref 8.9–10.3)
Chloride: 110 mmol/L (ref 98–111)
Creatinine, Ser: 1.14 mg/dL (ref 0.61–1.24)
GFR, Estimated: >60 mL/min (ref >60)
Glucose, Bld: 83 mg/dL (ref 70–99)
Potassium: 4.5 mmol/L (ref 3.5–5.1)
Sodium: 141 mmol/L (ref 135–145)

## 2023-03-11 LAB — CBC
HCT: 37.6 % — ABNORMAL LOW (ref 39.0–52.0)
Hemoglobin: 12.1 g/dL — ABNORMAL LOW (ref 13.0–17.0)
MCH: 33.5 pg (ref 26.0–34.0)
MCHC: 32.2 g/dL (ref 30.0–36.0)
MCV: 104.2 fL — ABNORMAL HIGH (ref 80.0–100.0)
Platelets: 186 10*3/uL (ref 150–400)
RBC: 3.61 MIL/uL — ABNORMAL LOW (ref 4.22–5.81)
RDW: 14.1 % (ref 11.5–15.5)
WBC: 7.1 10*3/uL (ref 4.0–10.5)
nRBC: 0 % (ref 0.0–0.2)

## 2023-03-11 MED ORDER — FE FUM-VIT C-VIT B12-FA 460-60-0.01-1 MG PO CAPS
1.0000 | ORAL_CAPSULE | Freq: Two times a day (BID) | ORAL | 0 refills | Status: DC
  Filled 2023-03-11: qty 60, 30d supply, fill #0

## 2023-03-11 MED ORDER — AMIODARONE HCL 200 MG PO TABS
200.0000 mg | ORAL_TABLET | Freq: Two times a day (BID) | ORAL | 0 refills | Status: DC
  Filled 2023-03-11: qty 60, 30d supply, fill #0

## 2023-03-11 MED ORDER — METOPROLOL TARTRATE 25 MG PO TABS
12.5000 mg | ORAL_TABLET | Freq: Two times a day (BID) | ORAL | 0 refills | Status: DC
  Filled 2023-03-11: qty 30, 30d supply, fill #0

## 2023-03-11 MED ORDER — ROSUVASTATIN CALCIUM 10 MG PO TABS
10.0000 mg | ORAL_TABLET | Freq: Every evening | ORAL | 0 refills | Status: DC
  Filled 2023-03-11: qty 30, 30d supply, fill #0

## 2023-03-11 MED ORDER — SENNOSIDES-DOCUSATE SODIUM 8.6-50 MG PO TABS: 2.0000 | ORAL_TABLET | Freq: Every day | ORAL | Status: DC

## 2023-03-11 MED ORDER — ASPIRIN 325 MG PO TABS
325.0000 mg | ORAL_TABLET | Freq: Every day | ORAL | 0 refills | Status: DC
  Filled 2023-03-11: qty 100, 100d supply, fill #0

## 2023-03-11 MED ORDER — TRAMADOL HCL 50 MG PO TABS
50.0000 mg | ORAL_TABLET | Freq: Four times a day (QID) | ORAL | 0 refills | Status: DC | PRN
  Filled 2023-03-11: qty 30, 7d supply, fill #0

## 2023-03-11 NOTE — Progress Notes (Signed)
PROGRESS NOTE   Subjective/Complaints:  No acute complaints.  No events overnight. Patient endorses feeling tired and somewhat nauseous this afternoon with therapies, but otherwise no complaints.  Continues to have poor sleep.  Feels he is generally at his baseline.  Vitals stable.  A.m. labs stable. Continent of bowel and bladder, last bowel movement 2-1, medium, black.  ROS: no chest pain, shortness of breath, fevers, chills, abdominal pain, new sensory loss or motor weakness, or new incontinence + Nausea + Insomnia + Fatigue  Objective:   No results found. Recent Labs    03/11/23 0541  WBC 7.1  HGB 12.1*  HCT 37.6*  PLT 186    Recent Labs    03/11/23 0541  NA 141  K 4.5  CL 110  CO2 24  GLUCOSE 83  BUN 13  CREATININE 1.14  CALCIUM 9.0    Intake/Output Summary (Last 24 hours) at 03/11/2023 0814 Last data filed at 03/10/2023 1904 Gross per 24 hour  Intake 472 ml  Output --  Net 472 ml        Physical Exam: Vital Signs Blood pressure 101/71, pulse 62, temperature 98.8 F (37.1 C), temperature source Oral, resp. rate 16, height 5\' 10"  (1.778 m), weight 94.8 kg, SpO2 97%.  General: No acute distress.  Sitting in gym. Mood and affect are appropriate Heart: Regular rate and rhythm no rubs murmurs or extra sounds Lungs: Clear to auscultation, breathing unlabored, no rales or wheezes Abdomen: Positive bowel sounds, soft nontender to palpation, nondistended Extremities: No clubbing, cyanosis, or edema Skin: No evidence of breakdown, no evidence of rash.  Prior craniotomy sites well-healed. Sternal incision healing well, well-approximated, dermabonded.  MSK:      No apparent deformity. Full AROM       Neurologic exam:  Cognition: AAO to person, place, time and event.  Follows all simple commands Mild cognitive, memory deficits.  Mild difficulty with concentration. Cranial nerves II through XII intact,  5 out of 5 throughout bilateral upper and lower extremities, sensory intact.  No ataxia.  Reflexes normal.  Assessment/Plan: 1. Functional deficits which require 3+ hours per day of interdisciplinary therapy in a comprehensive inpatient rehab setting. Physiatrist is providing close team supervision and 24 hour management of active medical problems listed below. Physiatrist and rehab team continue to assess barriers to discharge/monitor patient progress toward functional and medical goals  Care Tool:  Bathing    Body parts bathed by patient: Right arm, Left upper leg, Left arm, Right lower leg, Abdomen, Left lower leg, Front perineal area, Face, Buttocks, Right upper leg, Chest         Bathing assist Assist Level: Minimal Assistance - Patient > 75%     Upper Body Dressing/Undressing Upper body dressing   What is the patient wearing?: Pull over shirt    Upper body assist Assist Level: Minimal Assistance - Patient > 75%    Lower Body Dressing/Undressing Lower body dressing      What is the patient wearing?: Underwear/pull up, Pants     Lower body assist Assist for lower body dressing: Minimal Assistance - Patient > 75%     Editor, commissioning  assist Assist for toileting: Minimal Assistance - Patient > 75%     Transfers Chair/bed transfer  Transfers assist     Chair/bed transfer assist level: Minimal Assistance - Patient > 75%     Locomotion Ambulation   Ambulation assist      Assist level: Contact Guard/Touching assist Assistive device: Rollator Max distance: 300+   Walk 10 feet activity   Assist     Assist level: Contact Guard/Touching assist Assistive device: Rollator   Walk 50 feet activity   Assist    Assist level: Contact Guard/Touching assist Assistive device: Rollator    Walk 150 feet activity   Assist    Assist level: Contact Guard/Touching assist Assistive device: Rollator    Walk 10 feet on uneven surface   activity   Assist     Assist level: Contact Guard/Touching assist Assistive device: Rollator   Wheelchair     Assist Is the patient using a wheelchair?: Yes Type of Wheelchair: Manual    Wheelchair assist level: Dependent - Patient 0%      Wheelchair 50 feet with 2 turns activity    Assist        Assist Level: Dependent - Patient 0%   Wheelchair 150 feet activity     Assist      Assist Level: Dependent - Patient 0%   Blood pressure 101/71, pulse 62, temperature 98.8 F (37.1 C), temperature source Oral, resp. rate 16, height 5\' 10"  (1.778 m), weight 94.8 kg, SpO2 97%. Medical Problem List and Plan: 1. Functional deficits secondary to debility related AS and subsequent AVR, ultimately pericardial window for large pericardial effusion             -patient may  shower             -ELOS/Goals:2/4, mod I goals,              -sternal precautions  -Stable to continue CIR  2.  Antithrombotics: -DVT/anticoagulation:  Pharmaceutical: Lovenox added             -antiplatelet therapy:  ASA 3. Pain Management:  Oxycodone prn for severe pain and ultram prn for moderate pain.   - 1/30: used oxycodone 1x; DC  4. Mood/Behavior/Sleep: LCSW to follow for evaluation and support.              -antipsychotic agents: N/A  1-29: Mild sleep deficits due to difficulty with television; encourage patient to inform charge nurse to TV can get fixed.  Has melatonin 5 mg as needed available  1/31: Sleep remains poor, but this is patient's baseline per his report.  5. Neuropsych/cognition: This patient is capable of making decisions on his own behalf. 6. Skin/Wound Care:  Monitor incisions for healing.  Healing well. 7. Fluids/Electrolytes/Nutrition: Strict I/O. Refusing ensure --Will  change to prosource to promote wound healing -1-29, 2/3: AM labs appropriate.  Some nausea with therapies today, responsive to Compazine.  8. A fib: Has been tapered to amiodarone 200 mg bid with  metoprolol 12.5 mg bid.  Monitor for symptoms with increase in activity             -heart rate controlled and rhythm controlled      03/11/2023    5:35 AM 03/11/2023    4:55 AM 03/10/2023    7:54 PM  Vitals with BMI  Weight 209 lbs    BMI 29.99    Systolic  101 123  Diastolic  71 68  Pulse  62 70  9. H/o Seizure d/o: Continue Dilantin  at bedtime and Depakote bid--last was 15 years ago             --Followed by  [facility]   10. ABLA: On iron supplement.   -Improved from 9.6-11 on admission  2-1.  Stable.  Has had black movements, likely due to iron supplementation  11. AKI: Baseline Scr-1.15. Has been improving with SCr down to 1.20.   - Cr stable 2/3; improving    Latest Ref Rng & Units 03/11/2023    5:41 AM 03/08/2023    6:25 AM 03/07/2023    6:01 AM  BMP  Glucose 70 - 99 mg/dL 83  83  87   BUN 6 - 20 mg/dL 13  15  20    Creatinine 0.61 - 1.24 mg/dL 1.61  0.96  0.45   Sodium 135 - 145 mmol/L 141  140  140   Potassium 3.5 - 5.1 mmol/L 4.5  3.9  4.1   Chloride 98 - 111 mmol/L 110  106  105   CO2 22 - 32 mmol/L 24  23  24    Calcium 8.9 - 10.3 mg/dL 9.0  8.8  9.0      12. Constipation: On Dulcolax tab with Senna but refusing laxative every other day -will simplify to Senna S -2 tabs daily.  - LBM 2/1 - black       LOS: 6 days A FACE TO FACE EVALUATION WAS PERFORMED  Angelina Sheriff 03/11/2023, 8:14 AM

## 2023-03-11 NOTE — Progress Notes (Signed)
Inpatient Rehabilitation Discharge Medication Review by a Pharmacist  A complete drug regimen review was completed for this patient to identify any potential clinically significant medication issues.  High Risk Drug Classes Is patient taking? Indication by Medication  Antipsychotic No   Anticoagulant No   Antibiotic No   Opioid Yes Tramadol for acute pain    Antiplatelet Yes Aspirin for aortic valve replacement   Hypoglycemics/insulin No   Vasoactive Medication Yes Metoprolol for Afib   Chemotherapy No   Other Yes Amiodarone for afib  Divalproex and phenytoin for seizure d/o Rosuvastatin fir HLD Senna-doc for constipation     Type of Medication Issue Identified Description of Issue Recommendation(s)  Drug Interaction(s) (clinically significant)     Duplicate Therapy     Allergy     No Medication Administration End Date     Incorrect Dose     Additional Drug Therapy Needed     Significant med changes from prior encounter (inform family/care partners about these prior to discharge).    Other       Clinically significant medication issues were identified that warrant physician communication and completion of prescribed/recommended actions by midnight of the next day:  No  Name of provider notified for urgent issues identified:   Provider Method of Notification:     Pharmacist comments:   Time spent performing this drug regimen review (minutes):  15  Alphia Moh, PharmD, [place], [facility] Clinical Pharmacist  Please check AMION for all [facility] phone numbers After 10:00 PM, call Main Pharmacy [phone]

## 2023-03-11 NOTE — Progress Notes (Signed)
TCTS  Subjective:  No specific complaints. Planning for discharge home tomorrow.  Objective: Vital signs in last 24 hours: Temp:  [97.7 F (36.5 C)-98.8 F (37.1 C)] 97.7 F (36.5 C) (02/03 1334) Pulse Rate:  [62-70] 68 (02/03 1334) Resp:  [16-18] 18 (02/03 1334) BP: (101-132)/(68-78) 132/78 (02/03 1334) SpO2:  [96 %-97 %] 96 % (02/03 1334) Weight:  [94.8 kg] 94.8 kg (02/03 0535)  Hemodynamic parameters for last 24 hours:    Intake/Output from previous day: 02/02 0701 - 02/03 0700 In: 472 [P.O.:472] Out: -  Intake/Output this shift: Total I/O In: 230 [P.O.:230] Out: 250 [Urine:250]  General appearance: alert and cooperative Heart: regular rate and rhythm Lungs: clear to auscultation bilaterally Extremities: no edema Wound: incision healing well. Sternum stable.  Lab Results: Recent Labs    03/11/23 0541  WBC 7.1  HGB 12.1*  HCT 37.6*  PLT 186   BMET:  Recent Labs    03/11/23 0541  NA 141  K 4.5  CL 110  CO2 24  GLUCOSE 83  BUN 13  CREATININE 1.14  CALCIUM 9.0    PT/INR: No results for input(s): "LABPROT", "INR" in the last 72 hours. ABG    Component Value Date/Time   PHART 7.261 (L) 02/20/2023 0922   HCO3 13.1 (L) 02/20/2023 0922   TCO2 14 (L) 02/20/2023 0922   ACIDBASEDEF 13.0 (H) 02/20/2023 0922   O2SAT 99 02/20/2023 0922   CBG (last 3)  No results for input(s): "GLUCAP" in the last 72 hours.  Assessment/Plan:  Stable for discharge from cardiac surgery standpoint. We will arrange followup appt.   LOS: 6 days    Alleen Borne 03/11/2023

## 2023-03-11 NOTE — Plan of Care (Signed)
  Problem: Consults Goal: RH GENERAL PATIENT EDUCATION Description: See Patient Education module for education specifics. Outcome: Progressing   Problem: RH BOWEL ELIMINATION Goal: RH STG MANAGE BOWEL WITH ASSISTANCE Description: STG Manage Bowel with Mod I Assistance. Outcome: Progressing Goal: RH STG MANAGE BOWEL W/MEDICATION W/ASSISTANCE Description: STG Manage Bowel with Medication with Assistance. Outcome: Progressing   Problem: RH BLADDER ELIMINATION Goal: RH STG MANAGE BLADDER WITH ASSISTANCE Description: STG Manage Bladder With Mod I Assistance Outcome: Progressing   Problem: RH SAFETY Goal: RH STG ADHERE TO SAFETY PRECAUTIONS W/ASSISTANCE/DEVICE Description: STG Adhere to Safety Precautions With  Mod I Assistance/Device. Outcome: Progressing   Problem: RH PAIN MANAGEMENT Goal: RH STG PAIN MANAGED AT OR BELOW PT'S PAIN GOAL Description: <4 w/ prns Outcome: Progressing   Problem: RH KNOWLEDGE DEFICIT GENERAL Goal: RH STG INCREASE KNOWLEDGE OF SELF CARE AFTER HOSPITALIZATION Description:  Manage knowledge on self care after hospitalization with Mod I assistance   Outcome: Progressing

## 2023-03-11 NOTE — Progress Notes (Signed)
Occupational Therapy Discharge Summary  Patient Details  Name: SHAWNA [name] MRN: [id number] Date of Birth: 01-01-1966  Date of Discharge from OT service:March 12, 2023  {CHL IP REHAB OT TIME CALCULATIONS:[id number]}   Patient has met 8 of 8 long term goals due to improved activity tolerance, improved balance, postural control, and ability to compensate for deficits.  Patient to discharge at overall Modified Independent level.  Patient's care partner is independent to provide the necessary physical assistance at discharge- intermittent supervision and support with IADLs.    Recommendation:  Patient will benefit from ongoing skilled OT services in home health setting to continue to advance functional skills in the area of iADL and Vocation.  Equipment: No equipment provided  Reasons for discharge: treatment goals met and discharge from hospital  Patient/family agrees with progress made and goals achieved: Yes  OT Discharge Precautions/Restrictions  Precautions Precautions: Fall;Sternal Precaution Comments: sternal precautions Restrictions Weight Bearing Restrictions Per Provider Order: No RUE Weight Bearing Per Provider Order: Non weight bearing LUE Weight Bearing Per Provider Order: Non weight bearing General   Vital Signs   Pain Pain Assessment Pain Scale: 0-10 Pain Score: 0-No pain ADL ADL Eating: Independent Where Assessed-Eating: Chair Grooming: Modified independent Where Assessed-Grooming: Standing at sink Upper Body Bathing: Modified independent Where Assessed-Upper Body Bathing: Shower Lower Body Bathing: Modified independent Where Assessed-Lower Body Bathing: Shower Upper Body Dressing: Modified independent (Device) Where Assessed-Upper Body Dressing: Edge of bed Lower Body Dressing: Modified independent Where Assessed-Lower Body Dressing: Edge of bed Toileting: Modified independent Where Assessed-Toileting: Teacher, adult education: Sales promotion account executive Method: Insurance claims handler: Modified independent Web designer Method: Ship broker: Information systems manager without back Film/video editor: Modified independent Film/video editor Method: Designer, industrial/product: Information systems manager with back Vision Baseline Vision/History: 1 Wears glasses Patient Visual Report: No change from baseline Vision Assessment?: No apparent visual deficits Perception  Perception: Within Functional Limits Praxis Praxis: WFL Cognition Cognition Overall Cognitive Status: Within Functional Limits for tasks assessed Arousal/Alertness: Awake/alert Orientation Level: Person;Place;Situation Person: Oriented Place: Oriented Situation: Oriented Memory: Appears intact Awareness: Appears intact Problem Solving: Appears intact Safety/Judgment: Appears intact Brief Interview for Mental Status (BIMS) Repetition of Three Words (First Attempt): 3 Temporal Orientation: Year: Correct Temporal Orientation: Month: Accurate within 5 days Temporal Orientation: Day: Correct Recall: "Sock": Yes, no cue required Recall: "Blue": Yes, no cue required Recall: "Bed": Yes, no cue required BIMS Summary Score: 15 Sensation Sensation Light Touch: Appears Intact Hot/Cold: Appears Intact Coordination Gross Motor Movements are Fluid and Coordinated: No Fine Motor Movements are Fluid and Coordinated: Yes Coordination and Movement Description: generalized deconditioning and fear of falling impacts GM coordination Motor  Motor Motor: Within Functional Limits Mobility  Bed Mobility Bed Mobility: Supine to Sit;Sit to Supine Supine to Sit: Independent Sit to Supine: Independent Transfers Sit to Stand: Independent Stand to Sit: Independent  Trunk/Postural Assessment  Cervical Assessment Cervical Assessment: Within Functional Limits Thoracic Assessment Thoracic Assessment: Within Functional Limits Lumbar  Assessment Lumbar Assessment: Within Functional Limits Postural Control Postural Control: Deficits on evaluation (slightly delayed)  Balance Balance Balance Assessed: Yes Static Sitting Balance Static Sitting - Balance Support: Feet supported Static Sitting - Level of Assistance: 7: Independent Dynamic Sitting Balance Dynamic Sitting - Balance Support: Feet supported Dynamic Sitting - Level of Assistance: 7: Independent Static Standing Balance Static Standing - Balance Support: During functional activity Static Standing - Level of Assistance: 7: Independent Dynamic Standing Balance Dynamic Standing - Balance Support: Bilateral upper extremity  supported Dynamic Standing - Level of Assistance: 6: Modified independent (Device/Increase time) Extremity/Trunk Assessment RUE Assessment RUE Assessment: Exceptions to [facility] General Strength Comments: generalized weakness, no MMT 2/2 sternal precautions LUE Assessment LUE Assessment: Exceptions to [facility] General Strength Comments: generalized weakness, no MMT 2/2 sternal precautions   Jerry [name] 03/11/2023, 11:20 AM

## 2023-03-11 NOTE — Progress Notes (Signed)
Physical Therapy Session Note  Patient Details  Name: Jerry [name] MRN: [id number] Date of Birth: [birth year]  Today's Date: 03/11/2023 PT Individual Time: 0805-0859 PT Individual Time Calculation (min): 54 min   Today's Date: 03/11/2023 PT Individual Time: 1432-1530 PT Individual Time Calculation (min): 58 min   Short Term Goals: Week 1:  PT Short Term Goal 1 (Week 1): STGs = LTGs  Skilled Therapeutic Interventions/Progress Updates:     1st Session: Pt received seated in [facility] and agrees to therapy. Reports some pain in Rt side. Attributes it to soreness. PT provides rest breaks as needed to manage pain. Sit to stand with cues for initiation and body mechanics. Pt ambulates x150' to gym with CGA, with cues for upright gaze to improve posture and balance, and increasing gait speed and stride length to decrease risk for falls. Pt takes seated rest break. Pt completes activities in parallel bars for strengthening, balance, and gait training. Pt completes sidesteps with mirror for visual feedback and red theraband around distal thighs to engage hip abductors. Pt completes 3x20' with seated rest breaks between each bout. PT provides demonstration on feedback on performance, with cues for optimal body mechanics. Following, pt completes 3x5 standing hip abduction with yellow theraband for resistance just proximal to ankles. PT provides cues for optimal body mechanics and correct performance. Pt then completes 3x5 standing hip extensions with similar cues. Seated rest break. Pt ambulates back to room with same assistance and cues. Left seated on EOB with alarm intact and all needs within reach.   2nd Session: Pt received ambulating to toilet with RN staff. Agrees to therapy and no complaint of pain. Handed off to PT. Pt ambulates to gym without AD, with CGA and cues for upright gaze to improve posture and balance. Pt performs 3x10 sit to stand with arms across chest to increase balance challenge and lower  extremity demand. Seated rest break between bouts. Pt then ambulates x15' forward and x15' backward. Pt requires 12 steps for forward and x25 for backward. PT provides education on importance longer stride lengths and pt incorporates into training, improving to 9 steps forward and 12 steps backward.   Pt completes Nustep for endurance training and reciprocal coordination. Pt completes x10:00 at workload of 5 with only lower extremities to focus exercise benefits on lower body. Average steps per minute ~35. PT provides cues for foot placement and completing full available ROM.  Several rest breaks required. Pt ambulates back to room with same cues and assistance. Left supine in bed with alarm intact and all needs within reach.   Therapy Documentation Precautions:  Precautions Precautions: Fall, Sternal Precaution Booklet Issued: No Precaution Comments: sternal precautions Restrictions Weight Bearing Restrictions Per Provider Order: No RUE Weight Bearing Per Provider Order: Non weight bearing LUE Weight Bearing Per Provider Order: Non weight bearing Other Position/Activity Restrictions: sternal precautions   Therapy/Group: Individual Therapy  Beau Fanny, PT, DPT 03/11/2023, 3:46 PM

## 2023-03-11 NOTE — Progress Notes (Signed)
Occupational Therapy Session Note  Patient Details  Name: Jerry [name] MRN: [id number] Date of Birth: March 13, 1965  Today's Date: 03/11/2023 OT Individual Time: 4098-1191 OT Individual Time Calculation (min): 73 min    Short Term Goals: Week 1:  OT Short Term Goal 1 (Week 1): STG= LTG d/t ELOS  Skilled Therapeutic Interventions/Progress Updates:    Pt received sitting in the w/c with no c/o pain, agreeable to OT session, requesting to start with shower. He completed sit > stand from the w/c with mod I. He used the rollator to complete functional mobility into the bathroom with mod I. He had small LOB getting into shower but was able to recover. Discussed home carryover, DME recommendations and safety precautions to reduce fall risk. He transferred into shower and completed all bathing sit <> stand with mod I. Recommended use of LH sponge which pt reports he has at home. He transferred back to the w/c with min cueing for safety with (S). He donned shirt and pants with mod I. He completed 200 ft of functional mobility to the ADL apt for iADL practice- (S) - mod I mobility. He completed multiple furniture tranfers with mod I. He completed bed making task with mod I- did recommend that if he is going to fully change fitted sheets someone come help him ,he agreed. He completed kitchen mobility, carrying plates and reaching into cabinets, opening/closing fridge- min cueing for safety. He transferred sheet from washer > dryer, using a reacher with min cueing, (S). He returned to his room. Posted mod I in the room sign and reviewed precautions with pt, as well as alerted NT. Pt left sitting up with all needs met.    Therapy Documentation Precautions:  Precautions Precautions: Fall, Sternal Precaution Booklet Issued: No Precaution Comments: sternal precautions Restrictions Weight Bearing Restrictions Per Provider Order: No RUE Weight Bearing Per Provider Order: Non weight bearing LUE Weight Bearing  Per Provider Order: Non weight bearing Other Position/Activity Restrictions: sternal precautions Therapy/Group: Individual Therapy  Crissie Reese 03/11/2023, 6:30 AM

## 2023-03-11 NOTE — Progress Notes (Addendum)
Patient ID: Jerry [name], male   DOB: 02/06/1965, 58 y.o.   MRN: [id number]  SW sent out HHPT/OT referral and waiting on follow-up.  *referral accepted by [facility].   SW updated pt on HHA in place. Pt confirms his friend will pick him up tomorrow. Could be before 5:30pm/6pm.  Declined HHAs [facility]ie[facility]- not in network [facility]- no staffing in area [facility]  Cecile Sheerer, MSW, LCSW Office: [phone] Cell: [phone] Fax: [phone]

## 2023-03-12 ENCOUNTER — Ambulatory Visit: Payer: BC Managed Care – PPO | Admitting: Nurse Practitioner

## 2023-03-12 DIAGNOSIS — R5381 Other malaise: Secondary | ICD-10-CM | POA: Diagnosis not present

## 2023-03-12 NOTE — Consult Note (Signed)
 Neuropsychological Consultation Comprehensive Inpatient Rehab   Patient:   Jerry [name]   DOB:   December 27, 1965  MR Number:  [id number]  Location:  [facility] [address] CENTER A [address] [place] [address] Dept: [phone] Loc: [phone]           Date of Service:   03/12/2023  Start Time:   1 PM End Time:   2 PM  Provider/Observer:  Norleen Asa, Psy.D.       Clinical Neuropsychologist       Billing Code/Service: [phone]  Reason for Service:    Jerry [name] is a 58 year old male referred for neuropsychological consultation during his ongoing admission to the comprehensive rehabilitation unit.  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  Patient has continued to have seizure disorder post surgery.  Patient with history of hypertension, vasculitis, and cardiovascular issues.  Patient with recent cardiac/thoracic surgery.  Post medical stability therapy evaluations were completed patient was admitted home to CIR due to functional decline.  During today's clinical interview, the patient was oriented x 4 and was looking forward to his discharge later today.  Patient has no family to help post discharge but does have a friend who is picking him up and going to help him.  Patient will be living primarily alone post discharge.  Patient denied any significant depression or anxiety but was continuing to be distressed by experiences of very vivid dreams after surgery and continuation of these dreams at night for some time.  Patient reports that he has tended to stay up at night to avoid having some of these disturbing dreams.  He reports the frequency and intensity of these dreams has significantly decreased over the past couple of days and is hoping this continues.  This issue was reviewed and we worked on coping issues and normalization of some of these  experiences.  The patient did have almost a reflexive laugh when talking about some very challenging experiences and medical complications and/or medical risk with this surgery.  It did not appear that he was attempting to avoid dealing with some of the fears associated with his past surgery and I do think that that was likely his natural behavioral style as well.  HPI for the current admission:    HPI: Jerry [name] is a 58 year old RH-male with history of HTN, 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 Lucas. Post op, he developed hypoxia with ABLA. Amiodarone  added for intra operative A fib and noted to have recurrent episodes with A flutter v/s SVT. Dr. Walton consulted and recommended IV amiodarone  bolus with slow taper and changing lopressor  to bisoprolol  w/ K>4 and Mg>2.0. He continued to have tachycardia with burst of PAF and repeat echo 1/14 revealed large pericardial effusion. He was taken to OR on 01/15 for pericardial window with placement of left pleural chest tube. EPS recommended DOAC once cleared by surgery and continue amiodarone  for 3 weeks post op. AKI resolving and lasix  d/c. He has been weaned of oxygen and iron supplement added for ABLA. He continues to be limited by sternal precautions and requires supervision to min assist. He lives alone and was independent and working PTA. CIR recommended due to functional decline.   Medical History:   Past Medical History:  Diagnosis Date  Aortic stenosis    Head injury    Subdural hematoma after a traumatic brain injury in 1984   Heart murmur    Hypertension    Mixed hyperlipidemia    Seizure (HCC)    Skin cancer    Varicose veins    Vasculitis (HCC) 11/2014         Patient Active Problem List   Diagnosis Date Noted   Debility 03/05/2023   S/P AVR 02/20/2023   S/P aortic valve replacement with bioprosthetic valve  02/08/2023   ERRONEOUS ENCOUNTER--DISREGARD 01/11/2023   Severe aortic stenosis 12/10/2022   History of craniotomy 11/05/2022   Partial symptomatic epilepsy with complex partial seizures, not intractable, without status epilepticus (HCC) 11/05/2022   Aortic valve stenosis, severe 07/06/2022   Bicuspid aortic valve 07/06/2022   Benign essential microscopic hematuria 06/06/2016   HLD (hyperlipidemia) 06/27/2015   Venous stasis 01/21/2015   Seizure disorder (HCC) 10/07/2013   Head injury     Behavioral Observation/Mental Status:   Jerry [name]  presents as a 58 y.o.-year-old Right handed Caucasian Male who appeared his stated age. his dress was Appropriate and he was Well Groomed and his manners were Appropriate to the situation.  his participation was indicative of Appropriate and Redirectable behaviors.  There were physical disabilities noted.  he displayed an appropriate level of cooperation and motivation.    Interactions:    Active Appropriate  Attention:   within normal limits and although somewhat distracted right internal preoccupations but appears to be typical for behavioral styles.  Memory:   within normal limits; recent and remote memory intact  Visuo-spatial:   not examined  Speech (Volume):  normal  Speech:   normal; normal  Thought Process:  Coherent and Relevant  Coherent and Logical  Though Content:  WNL; not suicidal and not homicidal  Orientation:   person, place, time/date, and situation  Judgment:   Fair  Planning:   Fair  Affect:    Appropriate  Mood:    Euthymic  Insight:   Good  Intelligence:   normal  Family Med/Psych History:  Family History  Problem Relation Age of Onset   Diabetes Father    Impression/DX:   Jerry [name] is a 58 year old male referred for neuropsychological consultation during his ongoing admission to the comprehensive rehabilitation unit.  Patient has a past medical history including TBI in 1984 where he developed  a brain abscess and had craniotomy.  Patient has continued to have seizure disorder post surgery.  Patient with history of hypertension, vasculitis, and cardiovascular issues.  Patient with recent cardiac/thoracic surgery.  Post medical stability therapy evaluations were completed patient was admitted home to CIR due to functional decline.  During today's clinical interview, the patient was oriented x 4 and was looking forward to his discharge later today.  Patient has no family to help post discharge but does have a friend who is picking him up and going to help him.  Patient will be living primarily alone post discharge.  Patient denied any significant depression or anxiety but was continuing to be distressed by experiences of very vivid dreams after surgery and continuation of these dreams at night for some time.  Patient reports that he has tended to stay up at night to avoid having some of these disturbing dreams.  He reports the frequency and intensity of these dreams has significantly decreased over the past couple of days and is hoping this continues.  This issue was reviewed and we worked  on coping issues and normalization of some of these experiences.  The patient did have almost a reflexive laugh when talking about some very challenging experiences and medical complications and/or medical risk with this surgery.  It did not appear that he was attempting to avoid dealing with some of the fears associated with his past surgery and I do think that that was likely his natural behavioral style as well.          Electronically Signed   _______________________ Norleen Asa, Psy.D. Clinical Neuropsychologist

## 2023-03-12 NOTE — Progress Notes (Addendum)
 Patient ID: Jerry [name], male   DOB: [birth year], 58 y.o.   MRN: [id number]  SW informed by nursing, pt askign about rollator.   SW ordered rollator with [employer] via parachute.   *Per nursing, pt will leave without rollator.   Current issues with [employer] accepting current ICD10 codes in relation to his diagnosis reporting insurance will not accept codes in system (Z codes or debility). H&P uploaded and still issues. SW requested item be shipped to his home instead.  *Issue appears to be resolved, and item will be shipped to his home.  Jerry [name], MSW, LCSW Office: [phone] Cell: [phone] Fax: [phone]

## 2023-03-12 NOTE — Progress Notes (Signed)
 PROGRESS NOTE   Subjective/Complaints:  No acute complaints.  No events overnight. Patient feeling prepared for discharge. Only question regarding if he is allowed to wipe himself at home, since nursing has been doing it here; reviewed strategies to do this while maintaining sternal precautions.   ROS: no chest pain, shortness of breath, fevers, chills, abdominal pain, new sensory loss or motor weakness, or new incontinence  Objective:   No results found. Recent Labs    03/11/23 0541  WBC 7.1  HGB 12.1*  HCT 37.6*  PLT 186    Recent Labs    03/11/23 0541  NA 141  K 4.5  CL 110  CO2 24  GLUCOSE 83  BUN 13  CREATININE 1.14  CALCIUM  9.0    Intake/Output Summary (Last 24 hours) at 03/12/2023 2326 Last data filed at 03/12/2023 1245 Gross per 24 hour  Intake 480 ml  Output 200 ml  Net 280 ml        Physical Exam: Vital Signs Blood pressure 118/76, pulse 69, temperature 97.7 F (36.5 C), resp. rate 17, height 5' 10 (1.778 m), weight 97.9 kg, SpO2 98%.  General: No acute distress. Laying in bed.  Mood and affect are appropriate Heart: Regular rate and rhythm no rubs murmurs or extra sounds Lungs: Clear to auscultation, breathing unlabored, no rales or wheezes Abdomen: Positive bowel sounds, soft nontender to palpation, nondistended Extremities: No clubbing, cyanosis, or edema Skin: No evidence of breakdown, no evidence of rash.  Prior craniotomy sites well-healed. Sternal incision healing well, well-approximated, dermabonded. Psych: Flat, but appropriate MSK:      No apparent deformity. Full AROM       Neurologic exam:  Cognition: AAO to person, place, time and event.  Follows all simple commands Mild cognitive, memory deficits.  Mild difficulty with concentration. - baseline Cranial nerves II through XII intact, 5 out of 5 throughout bilateral upper and lower extremities, sensory intact.  No ataxia.   Reflexes normal.  Assessment/Plan: 1. Functional deficits which require 3+ hours per day of interdisciplinary therapy in a comprehensive inpatient rehab setting. Physiatrist is providing close team supervision and 24 hour management of active medical problems listed below. Physiatrist and rehab team continue to assess barriers to discharge/monitor patient progress toward functional and medical goals  Care Tool:  Bathing    Body parts bathed by patient: Right arm, Left upper leg, Left arm, Right lower leg, Abdomen, Left lower leg, Front perineal area, Face, Buttocks, Right upper leg, Chest         Bathing assist Assist Level: Independent with assistive device Assistive Device Comment: rollator, shower chair   Upper Body Dressing/Undressing Upper body dressing   What is the patient wearing?: Pull over shirt    Upper body assist Assist Level: Independent    Lower Body Dressing/Undressing Lower body dressing      What is the patient wearing?: Underwear/pull up     Lower body assist Assist for lower body dressing: Independent     Toileting Toileting    Toileting assist Assist for toileting: Independent with assistive device     Transfers Chair/bed transfer  Transfers assist     Chair/bed transfer assist  level: Independent with assistive device Chair/bed transfer assistive device: Geologist, Engineering   Ambulation assist      Assist level: Independent with assistive device Assistive device: Rollator Max distance: 300+   Walk 10 feet activity   Assist     Assist level: Independent with assistive device Assistive device: Rollator   Walk 50 feet activity   Assist    Assist level: Independent with assistive device Assistive device: Rollator    Walk 150 feet activity   Assist    Assist level: Independent with assistive device Assistive device: Rollator    Walk 10 feet on uneven surface  activity   Assist     Assist level:  Supervision/Verbal cueing Assistive device: Rollator   Wheelchair     Assist Is the patient using a wheelchair?: No Type of Wheelchair: Manual    Wheelchair assist level: Dependent - Patient 0%      Wheelchair 50 feet with 2 turns activity    Assist        Assist Level: Dependent - Patient 0%   Wheelchair 150 feet activity     Assist      Assist Level: Dependent - Patient 0%   Blood pressure 118/76, pulse 69, temperature 97.7 F (36.5 C), resp. rate 17, height 5' 10 (1.778 m), weight 97.9 kg, SpO2 98%. Medical Problem List and Plan: 1. Functional deficits secondary to debility related AS and subsequent AVR, ultimately pericardial window for large pericardial effusion             -patient may  shower             -ELOS/Goals:2/4, mod I goals,              -sternal precautions  The patient is medically ready for discharge to home and will need follow-up with [facility] PM&R. In addition, they will need to follow up with their PCP,  CT surgery .    2.  Antithrombotics: -DVT/anticoagulation:  Pharmaceutical: Lovenox                -antiplatelet therapy:  ASA 3. Pain Management:  Oxycodone  prn for severe pain and ultram  prn for moderate pain.   - 1/30: used oxycodone  1x; DC  4. Mood/Behavior/Sleep: LCSW to follow for evaluation and support.              -antipsychotic agents: N/A  1-29: Mild sleep deficits due to difficulty with television; encourage patient to inform charge nurse to TV can get fixed.  Has melatonin 5 mg as needed available  1/31: Sleep remains poor, but this is patient's baseline per his report.  5. Neuropsych/cognition: This patient is capable of making decisions on his own behalf.  2/4: Neuropsych evalled:  ...distressed by experiences of very vivid dreams after surgery and continuation of these dreams at night for some time.  Patient reports that he has tended to stay up at night to avoid having some of these disturbing dreams.  He reports the  frequency and intensity of these dreams has significantly decreased over the past couple of days and is hoping this continues. - Not reported to physician, will review this at follow up    6. Skin/Wound Care:  Monitor incisions for healing.  Healing well. 7. Fluids/Electrolytes/Nutrition: Strict I/O. Refusing ensure --Will  change to prosource to promote wound healing -1-29, 2/3: AM labs appropriate.  Some nausea with therapies today, responsive to Compazine .  8. A fib: Has been tapered to amiodarone  200 mg  bid with metoprolol  12.5 mg bid.  Monitor for symptoms with increase in activity             -heart rate controlled and rhythm controlled      03/12/2023   12:44 PM 03/12/2023    6:00 AM 03/12/2023    4:15 AM  Vitals with BMI  Weight  215 lbs 14 oz   BMI  30.98   Systolic 118  136  Diastolic 76  69  Pulse 69  61     9. H/o Seizure d/o: Continue Dilantin   at bedtime and Depakote  bid--last was 15 years ago             --Followed by  [facility]   10. ABLA: On iron supplement.   -Improved from 9.6-11 on admission  2-1.  Stable.  Has had black movements, likely due to iron supplementation  11. AKI: Baseline Scr-1.15. Has been improving with SCr down to 1.20.   - Cr stable 2/3; improving    Latest Ref Rng & Units 03/11/2023    5:41 AM 03/08/2023    6:25 AM 03/07/2023    6:01 AM  BMP  Glucose 70 - 99 mg/dL 83  83  87   BUN 6 - 20 mg/dL 13  15  20    Creatinine 0.61 - 1.24 mg/dL 8.85  8.76  8.55   Sodium 135 - 145 mmol/L 141  140  140   Potassium 3.5 - 5.1 mmol/L 4.5  3.9  4.1   Chloride 98 - 111 mmol/L 110  106  105   CO2 22 - 32 mmol/L 24  23  24    Calcium  8.9 - 10.3 mg/dL 9.0  8.8  9.0      12. Constipation: On Dulcolax tab with Senna but refusing laxative every other day -will simplify to Senna S -2 tabs daily.  - LBM 2/1 - black       LOS: 7 days A FACE TO FACE EVALUATION WAS PERFORMED  Jerry [name] Likes 03/12/2023, 11:26 PM

## 2023-03-12 NOTE — Progress Notes (Signed)
 Physical Therapy Discharge Summary  Patient Details  Name: Jerry [name] MRN: [id number] Date of Birth: 03-14-1965  Date of Discharge from PT service:March 12, 2023   Patient has met 9 of 9 long term goals due to improved activity tolerance, improved balance, and improved postural control.  Patient to discharge at an ambulatory level Modified Independent.   Reasons goals not met: NA  Recommendation:  Patient will benefit from ongoing skilled PT services in outpatient setting to continue to advance safe functional mobility, address ongoing impairments in balance, ambulation, endurance, and minimize fall risk.  Equipment: Recommended rollator  Reasons for discharge: treatment goals met and discharge from hospital  Patient/family agrees with progress made and goals achieved: Yes  PT Discharge Precautions/Restrictions Precautions Precautions: Fall;Sternal Restrictions Weight Bearing Restrictions Per Provider Order: No Pain Interference Pain Interference Pain Effect on Sleep: 1. Rarely or not at all Pain Interference with Therapy Activities: 1. Rarely or not at all Pain Interference with Day-to-Day Activities: 1. Rarely or not at all Vision/Perception  Vision - History Ability to See in Adequate Light: 0 Adequate Perception Perception: Within Functional Limits Praxis Praxis: WFL  Cognition Overall Cognitive Status: Within Functional Limits for tasks assessed Arousal/Alertness: Awake/alert Orientation Level: Oriented X4 Memory: Appears intact Safety/Judgment: Appears intact Sensation Sensation Light Touch: Appears Intact Coordination Gross Motor Movements are Fluid and Coordinated: No Fine Motor Movements are Fluid and Coordinated: Yes Coordination and Movement Description: generalized deconditioning and fear of falling impacts GM coordination Motor  Motor Motor: Within Functional Limits  Mobility Bed Mobility Bed Mobility: Supine to Sit;Sit to Supine Supine  to Sit: Independent Sit to Supine: Independent Transfers Transfers: Sit to Stand;Stand to [employer] Transfers Stand to Sit: Independent Stand Pivot Transfers: Independent with assistive device Transfer (Assistive device): Rollator Locomotion  Gait Ambulation: Yes Gait Assistance: Independent with assistive device Gait Distance (Feet): 300 Feet Assistive device: Rollator Gait Gait: Yes Gait Pattern: Impaired Gait Pattern: Decreased stride length Stairs / Additional Locomotion Stairs: Yes Stairs Assistance: Supervision/Verbal cueing Stair Management Technique: Two rails Number of Stairs: 12 Height of Stairs: 6 Ramp: Supervision/Verbal cueing Curb: Supervision/Verbal cueing Pick up small object from the floor assist level: Supervision/Verbal cueing Wheelchair Mobility Wheelchair Mobility: No  Trunk/Postural Assessment  Cervical Assessment Cervical Assessment: Within Functional Limits Thoracic Assessment Thoracic Assessment: Within Functional Limits Postural Control Postural Control: Deficits on evaluation (slightly delayed)  Balance Balance Balance Assessed: Yes Static Sitting Balance Static Sitting - Balance Support: Feet supported Static Sitting - Level of Assistance: 7: Independent Dynamic Sitting Balance Dynamic Sitting - Balance Support: Feet supported Dynamic Sitting - Level of Assistance: 7: Independent Static Standing Balance Static Standing - Balance Support: During functional activity Static Standing - Level of Assistance: 7: Independent Dynamic Standing Balance Dynamic Standing - Balance Support: Bilateral upper extremity supported Dynamic Standing - Level of Assistance: 6: Modified independent (Device/Increase time) Extremity Assessment  RLE Assessment RLE Assessment: Exceptions to [facility] General Strength Comments: Grossly 4+/5 LLE Assessment LLE Assessment: Exceptions to [facility] General Strength Comments: Grossly 4+/5   Elsie JAYSON Dawn, PT,  DPT 03/12/2023, 4:47 PM

## 2023-03-12 NOTE — Progress Notes (Signed)
Patient refused to wait for rollator to be brought in to the room and requested to leave without it.

## 2023-03-13 ENCOUNTER — Telehealth: Payer: Self-pay | Admitting: Physical Medicine and Rehabilitation

## 2023-03-13 ENCOUNTER — Ambulatory Visit: Payer: BC Managed Care – PPO | Admitting: Surgery

## 2023-03-13 NOTE — Telephone Encounter (Signed)
 Has question regarding Aspirin , needs advise on whether he needs to take it. He also needs to know the dosage to take  Please advise

## 2023-03-13 NOTE — Progress Notes (Signed)
 Inpatient Rehabilitation Care Coordinator Discharge Note   Patient Details  Name: Jerry [name] MRN: [id number] Date of Birth: [birth year]   Discharge location: D/c to home  Length of Stay: 6 days  Discharge activity level: Mod I  Home/community participation: Limited  Patient response un:Yzjouy Literacy - How often do you need to have someone help you when you read instructions, pamphlets, or other written material from your doctor or pharmacy?: Never  Patient response un:Dnrpjo Isolation - How often do you feel lonely or isolated from those around you?: Never  Services provided included: MD, RD, PT, OT, RN, TR, Pharmacy, Neuropsych, SW, CM  Financial Services:  Field Seismologist Utilized: Barrister's Clerk  Choices offered to/list presented to: patient  Follow-up services arranged:  Home Health, DME Home Health Agency: [facility] for HHPT/OT    DME : [employer]- rollator- being shipped to home    Patient response to transportation need: Is the patient able to respond to transportation needs?: Yes In the past 12 months, has lack of transportation kept you from medical appointments or from getting medications?: No In the past 12 months, has lack of transportation kept you from meetings, work, or from getting things needed for daily living?: No   Patient/Family verbalized understanding of follow-up arrangements:  Yes  Individual responsible for coordination of the follow-up plan: contact pt [phone]  Confirmed correct DME delivered: Jerry [name] 03/13/2023    Comments (or additional information):  Summary of Stay    Date/Time Discharge Planning CSW  03/11/23 1030 Pt will d/c to home with PRN support from friends. [facility] for HHPT/OT. SW will confirm there are no barriers to discharge. AAC       Aiven Kampe A [name]

## 2023-03-14 ENCOUNTER — Telehealth: Payer: Self-pay | Admitting: Physical Medicine and Rehabilitation

## 2023-03-14 NOTE — Telephone Encounter (Signed)
 Patient has questions and would like to speak to physician

## 2023-03-15 DIAGNOSIS — Z48812 Encounter for surgical aftercare following surgery on the circulatory system: Secondary | ICD-10-CM | POA: Diagnosis not present

## 2023-03-15 NOTE — Telephone Encounter (Signed)
 Called back, clarified with patient that he is to be taking aspirin  325 mg daily.

## 2023-03-19 ENCOUNTER — Ambulatory Visit: Payer: BC Managed Care – PPO | Admitting: Adult Health

## 2023-03-19 ENCOUNTER — Other Ambulatory Visit: Payer: Self-pay | Admitting: Surgery

## 2023-03-20 ENCOUNTER — Other Ambulatory Visit: Payer: BC Managed Care – PPO

## 2023-04-02 ENCOUNTER — Other Ambulatory Visit: Payer: Self-pay | Admitting: Surgery

## 2023-04-02 DIAGNOSIS — I350 Nonrheumatic aortic (valve) stenosis: Secondary | ICD-10-CM

## 2023-04-03 ENCOUNTER — Encounter: Payer: Self-pay | Admitting: Surgery

## 2023-04-03 ENCOUNTER — Ambulatory Visit
Admission: RE | Admit: 2023-04-03 | Discharge: 2023-04-03 | Disposition: A | Discharge status: Home / Self Care | Payer: BC Managed Care – PPO | Source: Ambulatory Visit | Attending: Surgery | Admitting: Surgery

## 2023-04-03 ENCOUNTER — Ambulatory Visit: Payer: Self-pay | Admitting: Surgery

## 2023-04-03 VITALS — BP 155/84 | HR 75 | Resp 18 | Ht 70.0 in | Wt 211.0 lb

## 2023-04-03 DIAGNOSIS — Z953 Presence of xenogenic heart valve: Secondary | ICD-10-CM

## 2023-04-03 DIAGNOSIS — I350 Nonrheumatic aortic (valve) stenosis: Secondary | ICD-10-CM

## 2023-04-03 DIAGNOSIS — Z952 Presence of prosthetic heart valve: Secondary | ICD-10-CM

## 2023-04-05 NOTE — Progress Notes (Signed)
 HPI: Patient returns for routine postoperative follow-up having undergone aortic valve replacement using a 25 mm Edwards INSPIRIS RESILIA pericardial valve on 02/08/2023. The patient's early postoperative recovery while in the hospital was notable for development of postoperative atrial fibrillation converted with amiodarone.  He developed increasing episodes of atrial fibrillation with rapid ventricular response as well as some bradycardia when in sinus rhythm and was noted to have a lower blood pressure.  An echocardiogram showed a large circumferential pericardial effusion without tamponade and he was returned to the operating room on 02/20/2023 for subxiphoid pericardial window which drained 600 cc of old bloody fluid.  This resulted in immediate rise in his blood pressure and decrease in his heart rate.  He continued to progress and was discharged to inpatient rehab for further recovery since he lives alone with no family or friends. Since hospital discharge the patient reports that he has been feeling well.  He is walking daily without chest pain or shortness of breath.  His stamina continues to improve.   Current Outpatient Medications  Medication Sig Dispense Refill   amiodarone (PACERONE) 200 MG tablet Take 1 tablet (200 mg total) by mouth 2 (two) times daily. 60 tablet 0   aspirin 325 MG tablet Take 1 tablet (325 mg total) by mouth daily. 100 tablet 0   Cholecalciferol (VITAMIN D-3) 25 MCG (1000 UT) CAPS Take 1,000 Units by mouth in the morning.     divalproex (DEPAKOTE ER) 500 MG 24 hr tablet TAKE 3 TABLETS BY MOUTH IN THE MORNING AND 3 IN THE EVENING 540 tablet 3   Fe Fum-Vit C-Vit B12-FA (TRIGELS-F FORTE) CAPS capsule Take 1 capsule by mouth 2 (two) times daily. 60 capsule 0   metoprolol tartrate (LOPRESSOR) 25 MG tablet Take 0.5 tablets (12.5 mg total) by mouth 2 (two) times daily. 30 tablet 0   phenytoin (DILANTIN) 100 MG ER capsule Take 2 capsules (200 mg total) by mouth at bedtime.  180 capsule 3   rosuvastatin (CRESTOR) 10 MG tablet Take 1 tablet (10 mg total) by mouth every evening. 30 tablet 0   senna-docusate (SENOKOT-S) 8.6-50 MG tablet Take 2 tablets by mouth daily.     traMADol (ULTRAM) 50 MG tablet Take 1 tablet (50 mg total) by mouth every 6 (six) hours as needed for moderate pain (pain score 4-6) or severe pain (pain score 7-10). 30 tablet 0   No current facility-administered medications for this visit.    Physical Exam: BP (!) 155/84 (BP Location: Left Arm)   Pulse 75   Resp 18   Ht 5\' 10"  (1.778 m)   Wt 211 lb (95.7 kg)   SpO2 96%   BMI 30.28 kg/m  He looks well. Cardiac exam shows a regular rate and rhythm with normal heart sounds.  There is no murmur. The chest incision is well-healed and the sternum is stable. Lungs are clear. There is no peripheral edema.  Diagnostic Tests:  Narrative & Impression  CLINICAL DATA:  Aortic valve replacement   EXAM: CHEST - 2 VIEW   COMPARISON:  02/22/2023   FINDINGS: The heart size and mediastinal contours are within normal limits. Prior sternotomy and cardiac valve replacement. Low lung volumes with minimal linear bibasilar atelectasis. No significant pleural fluid collection. No pneumothorax. The visualized skeletal structures are unremarkable.   IMPRESSION: Low lung volumes with minimal bibasilar atelectasis.     Electronically Signed   By: Duanne Guess D.O.   On: 04/03/2023 16:43    Impression:  Overall I think he is doing well approximately 6 weeks following his surgery.  I encouraged him to continue walking is much as possible but ask him not to lift anything heavier than 10 pounds for 3 months postoperatively.  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  He has a follow-up echocardiogram on 04/09/2023 and a cardiology appointment on 04/26/2023.  Plan:  He will continue to follow-up with cardiology and will contact me if he has any problems with his  incision.   Alleen Borne, MD [facility] [phone]

## 2023-04-08 ENCOUNTER — Encounter: Payer: Self-pay | Admitting: Physical Medicine and Rehabilitation

## 2023-04-08 ENCOUNTER — Telehealth: Payer: Self-pay | Admitting: Physical Medicine and Rehabilitation

## 2023-04-08 NOTE — Telephone Encounter (Signed)
 Patient needs a refill on metoprolometoprolol and amiodarone.

## 2023-04-09 ENCOUNTER — Telehealth: Payer: Self-pay

## 2023-04-09 ENCOUNTER — Ambulatory Visit: Payer: BC Managed Care – PPO | Admitting: Nurse Practitioner

## 2023-04-09 ENCOUNTER — Other Ambulatory Visit: Payer: Self-pay

## 2023-04-09 ENCOUNTER — Ambulatory Visit: Payer: BC Managed Care – PPO | Attending: Cardiology

## 2023-04-09 DIAGNOSIS — I350 Nonrheumatic aortic (valve) stenosis: Secondary | ICD-10-CM

## 2023-04-09 LAB — ECHOCARDIOGRAM COMPLETE
AR max vel: 1.94 cm2
AV Area VTI: 2.01 cm2
AV Area mean vel: 1.94 cm2
AV Mean grad: 5.7 mmHg
AV Peak grad: 13.5 mmHg
Ao pk vel: 1.84 m/s
Area-P 1/2: 3.2 cm2
Calc EF: 59.3 %
MV VTI: 1.66 cm2
S' Lateral: 1.9 cm
Single Plane A2C EF: 58.5 %
Single Plane A4C EF: 59.3 %

## 2023-04-09 MED ORDER — AMIODARONE HCL 200 MG PO TABS: 200.0000 mg | ORAL_TABLET | Freq: Every day | ORAL | 0 refills | Status: DC

## 2023-04-09 MED ORDER — METOPROLOL TARTRATE 25 MG PO TABS: 12.5000 mg | ORAL_TABLET | Freq: Two times a day (BID) | ORAL | 0 refills | Status: DC

## 2023-04-09 NOTE — Telephone Encounter (Signed)
 Patient contacted the office requesting refills of Metoprolol and Amiodarone. He is s/p CABG with Dr. Laneta Simmers 02/08/23 and has since been discharged from the practice. But does not have a Cardiology appt until the end of the month. Patient advised one month supply was sent into the [facility] in [place] per patient's request. Advised to contact Cardiology for future refills. Patient acknowledged receipt.

## 2023-04-15 ENCOUNTER — Ambulatory Visit: Payer: BC Managed Care – PPO | Admitting: Internal Medicine

## 2023-04-22 ENCOUNTER — Encounter: Payer: Self-pay | Admitting: *Deleted

## 2023-04-26 ENCOUNTER — Ambulatory Visit: Payer: BC Managed Care – PPO | Attending: Nurse Practitioner | Admitting: Nurse Practitioner

## 2023-04-26 ENCOUNTER — Encounter: Payer: Self-pay | Admitting: Nurse Practitioner

## 2023-04-26 VITALS — BP 122/78 | HR 56 | Ht 70.5 in | Wt 216.2 lb

## 2023-04-26 DIAGNOSIS — I4891 Unspecified atrial fibrillation: Secondary | ICD-10-CM

## 2023-04-26 DIAGNOSIS — Z79899 Other long term (current) drug therapy: Secondary | ICD-10-CM

## 2023-04-26 DIAGNOSIS — E785 Hyperlipidemia, unspecified: Secondary | ICD-10-CM

## 2023-04-26 DIAGNOSIS — Z9889 Other specified postprocedural states: Secondary | ICD-10-CM

## 2023-04-26 DIAGNOSIS — Z952 Presence of prosthetic heart valve: Secondary | ICD-10-CM

## 2023-04-26 DIAGNOSIS — R251 Tremor, unspecified: Secondary | ICD-10-CM | POA: Diagnosis not present

## 2023-04-26 DIAGNOSIS — I10 Essential (primary) hypertension: Secondary | ICD-10-CM

## 2023-04-26 MED ORDER — METOPROLOL TARTRATE 25 MG PO TABS: 12.5000 mg | ORAL_TABLET | Freq: Two times a day (BID) | ORAL | 6 refills | Status: DC

## 2023-04-26 MED ORDER — AMIODARONE HCL 200 MG PO TABS
100.0000 mg | ORAL_TABLET | Freq: Every day | ORAL | Status: DC
Start: 2023-04-26 — End: 2023-05-27

## 2023-04-26 NOTE — Patient Instructions (Addendum)
 Medication Instructions:   Decrease Amiodarone to 100mg  daily  Lopressor refilled today  Continue all other medications.     Labwork:  Thyroid panel, Mg, CBC, CMET - orders given  Please do in 2-3 weeks  Office will contact with results via phone, letter or mychart.     Testing/Procedures:  none  Follow-Up:  3 months   Any Other Special Instructions Will Be Listed Below (If Applicable).   If you need a refill on your cardiac medications before your next appointment, please call your pharmacy.

## 2023-04-26 NOTE — Progress Notes (Signed)
 Cardiology Office Note:  .   Date:  04/26/2023 ID:  Jerry [name], DOB 1965/05/24, MRN [id number] PCP: Donetta Potts, MD  [facility] Providers Cardiologist:  Marjo Bicker, MD    History of Present Illness: Jerry [name] is a 58 y.o. male with a PMH of bicuspid aortic valve and severe aortic valve stenosis, s/p AVR (02/2023), history of seizures, vasculitis, history of subdural hematoma, s/p TBI in 1994,  pericardial effusion, s/p pericardial window (02/2023)-, hyperlipidemia, who presents today for scheduled follow-up.  2D echocardiogram in May 2023 revealed normal EF, severe aortic valve stenosis with mean gradient 38.2 mmHg.  Updated echo revealed normal EF, mild LVH, grade 1 DD, severe aortic valve stenosis mean gradient 32 mmHg.  Evaluated by Dr. Excell Seltzer in August 2024, recommended to undergo further restratification.  ETT was positive EKG for ischemia.  Cardiac CTA revealed mild obstructive disease.  Last seen by Robin Searing, NP on December 07, 2022.  He was overall doing well at that time.  Right and left heart cath was discussed with him in the office.  Underwent right and left heart cath in November 2024 that revealed severe bicuspid aortic valve stenosis with widely patent coronary arteries, mild nonobstructive plaquing along RCA.  Normal right heart hemodynamics-see full report below.  Underwent median sternotomy with AVR by Dr. Lavinia Sharps on February 07, 2023.  Postoperatively developed some anemia due to blood loss and hypoxia.  Amiodarone was added for intraoperative A-fib, noted to have recurrent episodes of a flutter versus SVT.  Dr. Elberta Fortis was consulted.  Recommended IV amiodarone.  Continue to have tachycardia with burst of PAF.  Repeated echo revealed large pericardial effusion, was taken to the OR for pericardial window with placement of left pleural chest tube.  EP recommended DOAC once cleared by surgery and continue amiodarone for 3 weeks postop.  Due to  decreased functional mobility, was admitted for comprehensive rehab program.  Today he presents for s/p AVR follow-up.  He states he is recovering well.  Says he had some flulike symptoms October 26, that Sunday started feeling much better.  He says he is unsure what this is due to.  Does admit to some tremors that are more noticeable, says he thinks he had some tremors before surgery. Noticed along his hands. Denies any chest pain, shortness of breath, palpitations, syncope, presyncope, dizziness, orthopnea, PND, swelling or significant weight changes, acute bleeding, or claudication.  ROS: Negative. See HPI.   Works for [employer].   Studies Reviewed: Marland Kitchen    EKG: EKG is not ordered today.  Echo 04/2023:   1. Left ventricular ejection fraction, by estimation, is 55 to 60%. The  left ventricle has normal function. The left ventricle has no regional  wall motion abnormalities. Left ventricular diastolic parameters are  consistent with Grade I diastolic  dysfunction (impaired relaxation).   2. Right ventricular systolic function is normal. The right ventricular  size is normal.   3. The mitral valve is normal in structure. No evidence of mitral valve  regurgitation. No evidence of mitral stenosis.   4. The aortic valve has been repaired/replaced. Aortic valve  regurgitation is not visualized. No aortic stenosis is present. There is a  25 mm Edward Inspiris Resilia pericardial valve valve present in the  aortic position. Procedure Date: 02/08/2023. Echo  findings are consistent with normal structure and function of the aortic  valve prosthesis. Aortic valve mean gradient measures 5.7 mmHg.   Comparison(s): Prior  images reviewed side by side. Changes from prior  study are noted. Pericardial effusion is completely resolved now.  Carotid duplex 02/2023: Summary:  Right Carotid: Velocities in the right ICA are consistent with a 1-39%  stenosis.   Left Carotid: Velocities in the left ICA are  consistent with a 1-39%  stenosis.   Vertebrals: Bilateral vertebral arteries demonstrate antegrade flow.  Subclavians: Left subclavian artery was stenotic. Normal flow hemodynamics were seen in the right subclavian artery.    Right/left heart cath 12/2022:  1.  Known severe bicuspid aortic valve stenosis with bulky aortic valve calcification visualized on plain fluoroscopy 2.  Widely patent coronary arteries with normal left main, LAD, intermediate branch, and left circumflex.  Mild nonobstructive plaquing of a large, dominant RCA 3.  Normal right heart hemodynamics with a pulmonary wedge pressure of 13 mmHg, mean PA pressure 22 mmHg, right atrial pressure 9 mmHg.   Recommendations: Overnight observation due to social barriers, cardiac surgical consultation for aortic valve replacement  Physical Exam:   VS:  BP 122/78   Pulse (!) 56   Ht 5' 10.5" (1.791 m)   Wt 216 lb 3.2 oz (98.1 kg)   SpO2 96%   BMI 30.58 kg/m    Wt Readings from Last 3 Encounters:  04/26/23 216 lb 3.2 oz (98.1 kg)  04/03/23 211 lb (95.7 kg)  03/12/23 215 lb 14.4 oz (97.9 kg)    GEN: Well nourished, well developed in no acute distress NECK: No JVD; No carotid bruits CARDIAC: S1/S2, RRR, no murmurs, rubs, gallops RESPIRATORY:  Clear to auscultation without rales, wheezing or rhonchi  ABDOMEN: Soft, non-tender, non-distended EXTREMITIES:  No edema; No deformity, postural tremors noted to bilateral arms  ASSESSMENT AND PLAN: .    Severe aortic valve stenosis d/t bicuspid aortic valve, s/p AVR  Doing well post procedure. TTE 04/2023 revealed normal LVEF with normal function of aortic bioprosthesis. SBE prophylaxis discussed. No medication changes at this time besides what is noted below. Heart healthy diet and regular cardiovascular exercise encouraged.   2. Atrial Fibrillation, on amiodarone therapy, tremors Noted to have some intraoperative A-fib and had recurrent episodes of A-flutter versus SVT.  Dr. Elberta Fortis  was consulted.  Recommended IV amiodarone.  Continued to have tachycardia with burst of PAF. Denies any tachycardia or palpitations recently. Does admit to some worsening tremors - says he may have had this before surgery, says he is not sure if it is due to one of his medications. Will reduce Amiodarone to 100 mg daily and obtain the following labs: CBC, CMET, Mag, and thyroid panel. Will refill Lopressor.   3. Pericardial effusion, s/p pericardial window Post AVR he developed large pericardial effusion noted on repeat Echo, was taken to the OR for pericardial window with placement of left pleural chest tube. TTE 04/2023 revealed complete resolution of pericardial effusion. Denies any symptoms. Care and ED precautions discussed. EP's note from hospitalization recommended to start [facility] once CTS felt it was appropriate, and stated could potentially stop amiodarone at 3 month mark post operative AF. Plan to stop Amiodarone by next office visit. No indication about OAC from last progress note by CTS. Will consult CTS about OAC.   4. HLD LDL 39 2 months ago. Continue rosuvastatin. Heart healthy diet and regular cardiovascular exercise encouraged.    Dispo: Follow-up with me/APP in 3 months or sooner if anything changes.   Signed, Sharlene Dory, NP

## 2023-04-30 ENCOUNTER — Telehealth: Payer: Self-pay

## 2023-04-30 ENCOUNTER — Other Ambulatory Visit (HOSPITAL_COMMUNITY): Payer: Self-pay

## 2023-04-30 ENCOUNTER — Telehealth: Payer: Self-pay | Admitting: Nurse Practitioner

## 2023-04-30 NOTE — Telephone Encounter (Signed)
 Spoke with Steward Drone at [facility]. She states that patient was in the office for a dental cleaning. Advised that he would need prophylactic antibiotics before cleanings s/p AVR. She acknowledged receipt.

## 2023-04-30 NOTE — Telephone Encounter (Signed)
 Patient notified that he is ok to take Claritin and no interactions with his medications per Dr. Jenene Slicker and Pharm D. Patient verbalized understanding

## 2023-04-30 NOTE — Telephone Encounter (Signed)
 Pt c/o medication issue:  1. Name of Medication: Claritin     . How are you currently taking this medication (dosage and times per day)?   3. Are you having a reaction (difficulty breathing--STAT)?   4. What is your medication issue? Patient wants to know if it is alright for him to take this medicine along with his other medicine and his condition

## 2023-05-08 ENCOUNTER — Telehealth: Payer: Self-pay | Admitting: Internal Medicine

## 2023-05-08 NOTE — Telephone Encounter (Signed)
 PT presented to [place] office and expressed that his incision has a raised up spot and is red. He is asking if someone can look at it and advise him what is going on or what  to do. He is in lobby waiting

## 2023-05-08 NOTE — Telephone Encounter (Signed)
 Walked into office requesting to have someone look at his AVR incision. Reports last night he noticed a red spot that appeared on his incision line. Denies pain or drainage.  Observed small red spot to mid sternal incision area where AVR done in 02/2023 without drainage or tenderness.  Advised to continue monitoring and if he notices changes with size, color, or starting of drainage, to contact Dr. Sharee Pimple office. Verbalized understanding of plan.

## 2023-05-09 NOTE — Telephone Encounter (Signed)
 Patient walked in asking for a nurse to check the red spot from his AVR incision.

## 2023-05-09 NOTE — Telephone Encounter (Signed)
 Spoke with patient advised him that for any incision related issues he needs to contact Dr. Sharee Pimple office. Advised him I could give him the number as well. He already had it. Advise will reach back out to him.

## 2023-05-10 ENCOUNTER — Other Ambulatory Visit (HOSPITAL_BASED_OUTPATIENT_CLINIC_OR_DEPARTMENT_OTHER): Payer: Self-pay | Admitting: Nurse Practitioner

## 2023-05-11 LAB — CBC
Hematocrit: 40.4 % (ref 37.5–51.0)
Hemoglobin: 13.8 g/dL (ref 13.0–17.7)
MCH: 33.5 pg — ABNORMAL HIGH (ref 26.6–33.0)
MCHC: 34.2 g/dL (ref 31.5–35.7)
MCV: 98 fL — ABNORMAL HIGH (ref 79–97)
Platelets: 235 10*3/uL (ref 150–450)
RBC: 4.12 x10E6/uL — ABNORMAL LOW (ref 4.14–5.80)
RDW: 13.4 % (ref 11.6–15.4)
WBC: 10.4 10*3/uL (ref 3.4–10.8)

## 2023-05-11 LAB — COMPREHENSIVE METABOLIC PANEL WITH GFR
ALT: 15 IU/L (ref 0–44)
AST: 14 IU/L (ref 0–40)
Albumin: 4.3 g/dL (ref 3.8–4.9)
Alkaline Phosphatase: 85 IU/L (ref 44–121)
BUN/Creatinine Ratio: 9 (ref 9–20)
BUN: 10 mg/dL (ref 6–24)
Bilirubin Total: 0.3 mg/dL (ref 0.0–1.2)
CO2: 23 mmol/L (ref 20–29)
Calcium: 9.7 mg/dL (ref 8.7–10.2)
Chloride: 104 mmol/L (ref 96–106)
Creatinine, Ser: 1.07 mg/dL (ref 0.76–1.27)
Globulin, Total: 2.6 g/dL (ref 1.5–4.5)
Glucose: 78 mg/dL (ref 70–99)
Potassium: 3.9 mmol/L (ref 3.5–5.2)
Sodium: 146 mmol/L — ABNORMAL HIGH (ref 134–144)
Total Protein: 6.9 g/dL (ref 6.0–8.5)
eGFR: 81 mL/min/{1.73_m2} (ref >59)

## 2023-05-11 LAB — MAGNESIUM: Magnesium: 2 mg/dL (ref 1.6–2.3)

## 2023-05-11 LAB — THYROID PANEL WITH TSH
Free Thyroxine Index: 2.5 (ref 1.2–4.9)
T3 Uptake Ratio: 28 % (ref 24–39)
T4, Total: 8.8 ug/dL (ref 4.5–12.0)
TSH: 2.65 u[IU]/mL (ref 0.450–4.500)

## 2023-05-24 ENCOUNTER — Telehealth: Payer: Self-pay | Admitting: Internal Medicine

## 2023-05-24 NOTE — Telephone Encounter (Signed)
 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

## 2023-05-24 NOTE — Telephone Encounter (Signed)
 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

## 2023-05-24 NOTE — Telephone Encounter (Signed)
 MyChart message sent to patient.

## 2023-05-27 ENCOUNTER — Telehealth: Payer: Self-pay | Admitting: Nurse Practitioner

## 2023-05-27 NOTE — Telephone Encounter (Signed)
 Patient states he is not really sure if reducing it has helped much for him to notice but he is going to stop all together and see how that does

## 2023-05-27 NOTE — Telephone Encounter (Signed)
-----   Message from Lasalle Pointer sent at 05/25/2023  8:12 PM EDT ----- Please ask patient how his tremors are doing after reducing Amiodarone . Please tell him I have consulted with Dr. Mallipeddi who stated to stop Amiodarone . Please reflect this change in the medication list.   Thanks!  Lasalle Pointer, NP ----- Message ----- From: Lasalle Pointer, MD Sent: 05/24/2023   8:11 PM EDT To: Lasalle Pointer, NP  Discontinue Amio due to tremors. ----- Message ----- From: Lasalle Pointer, NP Sent: 04/27/2023   9:52 PM EDT To: Vishnu P Mallipeddi, MD

## 2023-05-27 NOTE — Telephone Encounter (Signed)
 MyChart message sent to patient.  Both numbers are invalid

## 2023-07-26 ENCOUNTER — Ambulatory Visit: Attending: Nurse Practitioner | Admitting: Nurse Practitioner

## 2023-07-26 ENCOUNTER — Encounter: Payer: Self-pay | Admitting: Nurse Practitioner

## 2023-07-26 VITALS — BP 126/80 | HR 67 | Ht 71.0 in | Wt 230.0 lb

## 2023-07-26 DIAGNOSIS — R251 Tremor, unspecified: Secondary | ICD-10-CM

## 2023-07-26 DIAGNOSIS — I4891 Unspecified atrial fibrillation: Secondary | ICD-10-CM | POA: Diagnosis not present

## 2023-07-26 DIAGNOSIS — E785 Hyperlipidemia, unspecified: Secondary | ICD-10-CM | POA: Diagnosis not present

## 2023-07-26 DIAGNOSIS — Z952 Presence of prosthetic heart valve: Secondary | ICD-10-CM

## 2023-07-26 DIAGNOSIS — Z9889 Other specified postprocedural states: Secondary | ICD-10-CM | POA: Diagnosis not present

## 2023-07-26 NOTE — Patient Instructions (Addendum)

## 2023-07-26 NOTE — Progress Notes (Unsigned)
 Cardiology Office Note:  .   Date:  04/26/2023 ID:  Jerry [name], DOB [birth year], MRN [id number] PCP: Lauran Pollard, MD  [facility] Providers Cardiologist:  Lasalle Pointer, MD    History of Present Illness: Jerry [name] is a 58 y.o. male with a PMH of bicuspid aortic valve and severe aortic valve stenosis, s/p AVR (02/2023), history of seizures, vasculitis, history of subdural hematoma, s/p TBI in 1994,  pericardial effusion, s/p pericardial window (02/2023)-, hyperlipidemia, who presents today for scheduled follow-up.  2D echocardiogram in May 2023 revealed normal EF, severe aortic valve stenosis with mean gradient 38.2 mmHg.  Updated echo revealed normal EF, mild LVH, grade 1 DD, severe aortic valve stenosis mean gradient 32 mmHg.  Evaluated by Dr. Arlester Ladd in August 2024, recommended to undergo further restratification.  ETT was positive EKG for ischemia.  Cardiac CTA revealed mild obstructive disease.  Last seen by Charles Connor, NP on December 07, 2022.  He was overall doing well at that time.  Right and left heart cath was discussed with him in the office.  Underwent right and left heart cath in November 2024 that revealed severe bicuspid aortic valve stenosis with widely patent coronary arteries, mild nonobstructive plaquing along RCA.  Normal right heart hemodynamics-see full report below.  Underwent median sternotomy with AVR by Dr. Ames Bakes on February 07, 2023.  Postoperatively developed some anemia due to blood loss and hypoxia.  Amiodarone  was added for intraoperative A-fib, noted to have recurrent episodes of a flutter versus SVT.  Dr. Lawana Pray was consulted.  Recommended IV amiodarone .  Continue to have tachycardia with burst of PAF.  Repeated echo revealed large pericardial effusion, was taken to the OR for pericardial window with placement of left pleural chest tube.  EP recommended DOAC once cleared by surgery and continue amiodarone  for 3 weeks postop.  Due to  decreased functional mobility, was admitted for comprehensive rehab program.  Today he presents for s/p AVR follow-up.  He states he is recovering well.  Says he had some flulike symptoms October 26, that Sunday started feeling much better.  He says he is unsure what this is due to.  Does admit to some tremors that are more noticeable, says he thinks he had some tremors before surgery. Noticed along his hands. Denies any chest pain, shortness of breath, palpitations, syncope, presyncope, dizziness, orthopnea, PND, swelling or significant weight changes, acute bleeding, or claudication.  ROS: Negative. See HPI.   Works for [employer].   Studies Reviewed: Aaron Aas    EKG: EKG is not ordered today.  Echo 04/2023:   1. Left ventricular ejection fraction, by estimation, is 55 to 60%. The  left ventricle has normal function. The left ventricle has no regional  wall motion abnormalities. Left ventricular diastolic parameters are  consistent with Grade I diastolic  dysfunction (impaired relaxation).   2. Right ventricular systolic function is normal. The right ventricular  size is normal.   3. The mitral valve is normal in structure. No evidence of mitral valve  regurgitation. No evidence of mitral stenosis.   4. The aortic valve has been repaired/replaced. Aortic valve  regurgitation is not visualized. No aortic stenosis is present. There is a  25 mm Edward Inspiris Resilia pericardial valve valve present in the  aortic position. Procedure Date: 02/08/2023. Echo  findings are consistent with normal structure and function of the aortic  valve prosthesis. Aortic valve mean gradient measures 5.7 mmHg.   Comparison(s): Prior  images reviewed side by side. Changes from prior  study are noted. Pericardial effusion is completely resolved now.  Carotid duplex 02/2023: Summary:  Right Carotid: Velocities in the right ICA are consistent with a 1-39%  stenosis.   Left Carotid: Velocities in the left ICA are  consistent with a 1-39%  stenosis.   Vertebrals: Bilateral vertebral arteries demonstrate antegrade flow.  Subclavians: Left subclavian artery was stenotic. Normal flow hemodynamics were seen in the right subclavian artery.    Right/left heart cath 12/2022:  1.  Known severe bicuspid aortic valve stenosis with bulky aortic valve calcification visualized on plain fluoroscopy 2.  Widely patent coronary arteries with normal left main, LAD, intermediate branch, and left circumflex.  Mild nonobstructive plaquing of a large, dominant RCA 3.  Normal right heart hemodynamics with a pulmonary wedge pressure of 13 mmHg, mean PA pressure 22 mmHg, right atrial pressure 9 mmHg.   Recommendations: Overnight observation due to social barriers, cardiac surgical consultation for aortic valve replacement  Physical Exam:   VS:  There were no vitals taken for this visit.   Wt Readings from Last 3 Encounters:  04/26/23 216 lb 3.2 oz (98.1 kg)  04/03/23 211 lb (95.7 kg)  03/12/23 215 lb 14.4 oz (97.9 kg)    GEN: Well nourished, well developed in no acute distress NECK: No JVD; No carotid bruits CARDIAC: S1/S2, RRR, no murmurs, rubs, gallops RESPIRATORY:  Clear to auscultation without rales, wheezing or rhonchi  ABDOMEN: Soft, non-tender, non-distended EXTREMITIES:  No edema; No deformity, postural tremors noted to bilateral arms  ASSESSMENT AND PLAN: .    Severe aortic valve stenosis d/t bicuspid aortic valve, s/p AVR  Doing well post procedure. TTE 04/2023 revealed normal LVEF with normal function of aortic bioprosthesis. SBE prophylaxis discussed. No medication changes at this time besides what is noted below. Heart healthy diet and regular cardiovascular exercise encouraged.   2. Atrial Fibrillation, on amiodarone  therapy, tremors Noted to have some intraoperative A-fib and had recurrent episodes of A-flutter versus SVT.  Dr. Lawana Pray was consulted.  Recommended IV amiodarone .  Continued to have  tachycardia with burst of PAF. Denies any tachycardia or palpitations recently. Does admit to some worsening tremors - says he may have had this before surgery, says he is not sure if it is due to one of his medications. Will reduce Amiodarone  to 100 mg daily and obtain the following labs: CBC, CMET, Mag, and thyroid  panel. Will refill Lopressor .   3. Pericardial effusion, s/p pericardial window Post AVR he developed large pericardial effusion noted on repeat Echo, was taken to the OR for pericardial window with placement of left pleural chest tube. TTE 04/2023 revealed complete resolution of pericardial effusion. Denies any symptoms. Care and ED precautions discussed. EP's note from hospitalization recommended to start OAC once CTS felt it was appropriate, and stated could potentially stop amiodarone  at 3 month mark post operative AF. Plan to stop Amiodarone  by next office visit. No indication about OAC from last progress note by CTS. Will consult CTS about OAC.   4. HLD LDL 39 2 months ago. Continue rosuvastatin . Heart healthy diet and regular cardiovascular exercise encouraged.    Dispo: Follow-up with me/APP in 3 months or sooner if anything changes.   Signed, Lasalle Pointer, NP

## 2023-08-06 ENCOUNTER — Other Ambulatory Visit: Payer: Self-pay

## 2023-08-06 ENCOUNTER — Telehealth: Payer: Self-pay | Admitting: Nurse Practitioner

## 2023-08-06 MED ORDER — METOPROLOL TARTRATE 25 MG PO TABS: 12.5000 mg | ORAL_TABLET | Freq: Two times a day (BID) | ORAL | 6 refills | Status: DC

## 2023-08-06 NOTE — Telephone Encounter (Signed)
*  STAT* If patient is at the pharmacy, call can be transferred to refill team.   1. Which medications need to be refilled? (please list name of each medication and dose if known)  [id number] - [place], [place] - [address]   2. Which pharmacy/location (including street and city if local pharmacy) is medication to be sent to? [facility] - [place], [place] - [address]   3. Do they need a 30 day or 90 day supply?  90 day supply

## 2023-08-06 NOTE — Progress Notes (Unsigned)
 No chief complaint on file.     ASSESSMENT AND PLAN  Jerry [name] is a 58 y.o. male   History of brain abscess, required craniotomy, Complex partial seizure  Has been on current dose of Depakote  ER 500 mg 3 tablets twice a day, Dilantin  100 mg 2 every night for many years,  He now lives alone, not on disability, does not want to go on disability, drive for living,  Will keep current dose of medications, recheck level,  Repeat EEG  Return To Clinic With NP In 9 Months   DIAGNOSTIC DATA (LABS, IMAGING, TESTING) - I reviewed patient records, labs, notes, testing and imaging myself where available.   MEDICAL HISTORY:  Jerry [name], is a 58 year old male, following up for partial seizure.  History is obtained from the patient and review of electronic medical records. I personally reviewed pertinent available imaging films in PACS.   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[employer], delivering samples, he never married, has no children, does not want to apply for disability, hope to keep his independence  He was treated at urgent care on 06/25/2022 for acute onset of double vision, lasting for half day  For that reason he had CT angiogram of head and neck [facility], no significant large vessel disease, status post left frontal craniotomy with encephalomalacia in the left left parietal and posterior frontal lobe paranasal sinus disease with complete OF occasional bilateral frontal  sinus  Lab: UDS was negative, Dilantin  level less 2.1, normal CBC, CMP  Update August 07, 2023 SS: Last visit TSH 2.170, Depakote  level 112, Dilantin  1.5.  EEG was normal October 2024.  In January he had aortic valve replacement.  PHYSICAL EXAM:   There were no vitals filed for this visit.  There is no height or weight on file to calculate BMI.  PHYSICAL EXAMNIATION:  Gen: NAD, conversant, well nourised, well groomed                     Cardiovascular: Regular rate rhythm, no peripheral edema, warm, nontender. Eyes: Conjunctivae clear without exudates or hemorrhage Neck: Supple, no carotid bruits. Pulmonary: Clear to auscultation bilaterally  Scalp: Well-healed bilateral frontal craniotomy scar  NEUROLOGICAL EXAM:  MENTAL STATUS: Speech/cognition: Awake, alert, oriented to history taking and casual conversation, but slow reaction time, CRANIAL NERVES: CN II: Visual fields are full to confrontation. Pupils are round equal and briskly reactive to light. CN III, IV, VI: extraocular movement are normal. No ptosis. CN V: Facial sensation is intact to light touch CN VII: Face is symmetric with normal eye closure  CN VIII: Hearing is normal to causal conversation. CN IX, X: Phonation is normal. CN XI: Head turning and shoulder shrug are intact  MOTOR: There is no pronator drift of out-stretched arms. Muscle bulk and tone are normal. Muscle strength is normal.  REFLEXES: Reflexes are 2+ and symmetric at the biceps, triceps, knees, and ankles. Plantar responses are flexor.  SENSORY: Intact  to light touch, pinprick and vibratory sensation are intact in fingers and toes.  COORDINATION: There is no trunk or limb dysmetria noted.  GAIT/STANCE: Posture is normal. Gait is steady with normal steps, base, arm swing, and turning. Heel and toe walking are normal. Tandem gait is normal.  Romberg is absent.  REVIEW OF SYSTEMS:  Full 14 system review of systems performed and notable only  for as above All other review of systems were negative.   ALLERGIES: No Known Allergies  HOME MEDICATIONS: Current Outpatient Medications  Medication Sig Dispense Refill   amLODipine  (NORVASC ) 10 MG tablet Take 10 mg by mouth daily.     aspirin  325 MG tablet Take 1 tablet (325 mg total) by mouth daily. 100 tablet 0   Cholecalciferol (VITAMIN D -3) 25 MCG (1000 UT) CAPS Take 1,000 Units by mouth in the morning.     divalproex  (DEPAKOTE  ER) 500 MG 24 hr tablet TAKE 3 TABLETS BY MOUTH IN THE MORNING AND 3 IN THE EVENING 540 tablet 3   indomethacin (INDOCIN) 50 MG capsule Take 50 mg by mouth 3 (three) times daily as needed.     metoprolol  tartrate (LOPRESSOR ) 25 MG tablet Take 0.5 tablets (12.5 mg total) by mouth 2 (two) times daily. 30 tablet 6   phenytoin  (DILANTIN ) 100 MG ER capsule Take 2 capsules (200 mg total) by mouth at bedtime. 180 capsule 3   rosuvastatin  (CRESTOR ) 10 MG tablet Take 1 tablet (10 mg total) by mouth every evening. 30 tablet 0   No current facility-administered medications for this visit.    PAST MEDICAL HISTORY: Past Medical History:  Diagnosis Date   Aortic stenosis    Head injury    Subdural hematoma after a traumatic brain injury in 1984   Heart murmur    Hypertension    Mixed hyperlipidemia    Seizure (HCC)    Skin cancer    Varicose veins    Vasculitis (HCC) 11/2014    PAST SURGICAL HISTORY: Past Surgical History:  Procedure Laterality Date   AORTIC VALVE REPLACEMENT N/A 02/08/2023   Procedure: AORTIC VALVE REPLACEMENT (AVR) USING INSPIRIS AORTIC VALVE SIZE ;  Surgeon: Lucas Dorise POUR, MD;  Location: [facility];  Service: Open Heart Surgery;  Laterality: N/A;   RIGHT/LEFT HEART CATH AND CORONARY ANGIOGRAPHY N/A 12/10/2022   Procedure: RIGHT/LEFT HEART CATH AND CORONARY ANGIOGRAPHY;  Surgeon: Wonda Sharper, MD;  Location: [facility] INVASIVE CV LAB;  Service: Cardiovascular;  Laterality: N/A;   SUBXYPHOID PERICARDIAL WINDOW N/A 02/20/2023   Procedure:  SUBXYPHOID PERICARDIAL WINDOW;  Surgeon: Lucas Dorise POUR, MD;  Location: [facility] OR;  Service: Thoracic;  Laterality: N/A;   Surgical plate in head  8015   TEE WITHOUT CARDIOVERSION N/A 02/08/2023   Procedure: TRANSESOPHAGEAL ECHOCARDIOGRAM;  Surgeon: Lucas Dorise POUR, MD;  Location: [facility] OR;  Service: Open Heart Surgery;  Laterality: N/A;   TEE WITHOUT CARDIOVERSION N/A 02/20/2023   Procedure: TRANSESOPHAGEAL ECHOCARDIOGRAM (TEE);  Surgeon: Lucas Dorise POUR, MD;  Location: [facility] OR;  Service: Thoracic;  Laterality: N/A;   TOE INTERNAL FIXATION W/ COMPRESSION SCREW Right     FAMILY HISTORY: Family History  Problem Relation Age of Onset   Diabetes Father     SOCIAL HISTORY: Social History   Socioeconomic History   Marital status: Single    Spouse name: Not on file   Number of children: 0   Years of education: 12   Highest education level: Not on file  Occupational History    Comment: Not working  Tobacco Use   Smoking status: Former    Current packs/day: 0.00    Types: Cigarettes    Quit date: 10/21/2014    Years since quitting: 8.7   Smokeless tobacco: Never  Vaping Use   Vaping status: Never Used  Substance and Sexual Activity   Alcohol use: No    Alcohol/week: 0.0 standard drinks of alcohol   Drug use: No   Sexual activity: Not on file  Other Topics Concern   Not on file  Social History Narrative   Patient is currently not working,High school education some college. Patient  Is single and lives with his mother.    Right handed.   Caffeine  one cup daily.   Social Drivers of Corporate investment banker Strain: Not on file  Food Insecurity: No Food Insecurity (02/09/2023)   Hunger Vital Sign    Worried About Running Out of Food in the Last Year: Never true    Ran Out of Food in the Last Year: Never true  Transportation Needs: No Transportation Needs (02/09/2023)   PRAPARE - Administrator, Civil Service (Medical): No    Lack of Transportation (Non-Medical): No   Physical Activity: Not on file  Stress: Not on file  Social Connections: Not on file  Intimate Partner Violence: Not At Risk (02/09/2023)   Humiliation, Afraid, Rape, and Kick questionnaire    Fear of Current or Ex-Partner: No    Emotionally Abused: No    Physically Abused: No    Sexually Abused: No   Lauraine Born, SCHARLENE, DNP  [facility] [address], [address] [place], [place] [address] [phone]

## 2023-08-07 ENCOUNTER — Telehealth: Payer: Self-pay | Admitting: Neurology

## 2023-08-07 ENCOUNTER — Ambulatory Visit (INDEPENDENT_AMBULATORY_CARE_PROVIDER_SITE_OTHER): Payer: BC Managed Care – PPO | Admitting: Neurology

## 2023-08-07 ENCOUNTER — Encounter: Payer: Self-pay | Admitting: Neurology

## 2023-08-07 VITALS — BP 140/84 | HR 71 | Ht 70.0 in | Wt 227.0 lb

## 2023-08-07 DIAGNOSIS — G40909 Epilepsy, unspecified, not intractable, without status epilepticus: Secondary | ICD-10-CM

## 2023-08-07 MED ORDER — PHENYTOIN SODIUM EXTENDED 100 MG PO CAPS: 200.0000 mg | ORAL_CAPSULE | Freq: Every day | ORAL | 3 refills | Status: AC

## 2023-08-07 MED ORDER — DIVALPROEX SODIUM ER 500 MG PO TB24: ORAL_TABLET | ORAL | 3 refills | Status: AC

## 2023-08-07 NOTE — Patient Instructions (Signed)
 Great to see you today.  Please continue current seizure medication.  We will check labs today.  Please call for seizure activity.  Follow-up in 1 year.  Thanks!!

## 2023-08-07 NOTE — Telephone Encounter (Signed)
 Can you request records from [facility] in [place]? Thanks

## 2023-08-08 ENCOUNTER — Ambulatory Visit: Payer: Self-pay | Admitting: Neurology

## 2023-08-08 ENCOUNTER — Telehealth: Payer: Self-pay | Admitting: Neurology

## 2023-08-08 DIAGNOSIS — G40909 Epilepsy, unspecified, not intractable, without status epilepticus: Secondary | ICD-10-CM

## 2023-08-08 LAB — PHENYTOIN LEVEL, TOTAL: Phenytoin (Dilantin), Serum: 2 ug/mL — ABNORMAL LOW (ref 10.0–20.0)

## 2023-08-08 LAB — VALPROIC ACID LEVEL: Valproic Acid Lvl: 161 ug/mL (ref 50–100)

## 2023-08-08 NOTE — Telephone Encounter (Signed)
 I called the patient, Depakote  level came back quite higher than expected at 161.  He claims he took his medication after his blood draw.  I would like to have him come today to have this rechecked.  He denies any symptoms of toxicity.  Orders Placed This Encounter  Procedures   Valproic Acid  Level

## 2023-08-12 ENCOUNTER — Other Ambulatory Visit (INDEPENDENT_AMBULATORY_CARE_PROVIDER_SITE_OTHER): Payer: Self-pay

## 2023-08-12 DIAGNOSIS — Z0289 Encounter for other administrative examinations: Secondary | ICD-10-CM

## 2023-08-12 DIAGNOSIS — G40909 Epilepsy, unspecified, not intractable, without status epilepticus: Secondary | ICD-10-CM

## 2023-08-13 ENCOUNTER — Telehealth: Payer: Self-pay | Admitting: Student

## 2023-08-13 LAB — VALPROIC ACID LEVEL: Valproic Acid Lvl: 120 ug/mL — ABNORMAL HIGH (ref 50–100)

## 2023-08-13 MED ORDER — ASPIRIN 81 MG PO TBEC: 81.0000 mg | DELAYED_RELEASE_TABLET | Freq: Every day | ORAL | Status: AC

## 2023-08-13 NOTE — Telephone Encounter (Signed)
-----   Message from [name] sent at 08/12/2023  9:05 PM EDT ----- Covering for Jerry - Please let the patient know Dr. Stacia did not say anything about him needing to be on anticoagulation but can reduce Aspirin  to 81mg  daily.   Thanks,  [place] ----- Message ----- From: Stacia Diannah SQUIBB, MD Sent: 08/12/2023   2:53 PM EDT To: Jerry Crate, NP  Currently on aspirin  325 per CTS. Can be aspirin  81 mg once daily ideally. ----- Message ----- From: [name], NP Sent: 08/01/2023   1:34 PM EDT To: [name], MD  No indication from CTS about OAC. Wanted to get your thoughts.   Thanks!   Best, Jerry Crate, NP

## 2023-08-13 NOTE — Telephone Encounter (Signed)
 Patient informed and verbalized understanding of plan. Med list updated

## 2023-08-15 ENCOUNTER — Telehealth: Payer: Self-pay | Admitting: Neurology

## 2023-08-15 NOTE — Telephone Encounter (Signed)
 I called the patient, Depakote  level was better at 120, but still high. Higher than baseline. No symptoms toxicity, feeling fine. We will recheck in 6 weeks with CMP to ensure no swings. No change in pills or new medications. 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

## 2023-09-02 ENCOUNTER — Telehealth: Payer: Self-pay | Admitting: Nurse Practitioner

## 2023-09-02 NOTE — Telephone Encounter (Signed)
 Pt came into [place] office asking to speak to elizabeth. I offered one of the nurses instead but he refused. He said he needs to speak with Almarie and Almarie only if she can call him!

## 2023-09-26 ENCOUNTER — Telehealth: Payer: Self-pay | Admitting: Neurology

## 2023-09-26 DIAGNOSIS — G40909 Epilepsy, unspecified, not intractable, without status epilepticus: Secondary | ICD-10-CM

## 2023-09-26 NOTE — Telephone Encounter (Signed)
 Call to patient, he is agreeable to [facility] in [place] for lab work tomorrow. Aware to hold medication until after lab work as that is how was done last time.

## 2023-09-26 NOTE — Telephone Encounter (Signed)
 Please ask to come for repeat labs on Depakote , next week is fine, try to come around time of last labs, as trough if that's how he did them for comparison. Thanks   Orders Placed This Encounter  Procedures   Valproic Acid  Level   CMP

## 2023-09-27 ENCOUNTER — Other Ambulatory Visit: Payer: Self-pay | Admitting: *Deleted

## 2023-09-27 DIAGNOSIS — G40909 Epilepsy, unspecified, not intractable, without status epilepticus: Secondary | ICD-10-CM

## 2023-09-28 LAB — COMPREHENSIVE METABOLIC PANEL WITH GFR
ALT: 24 IU/L (ref 0–44)
AST: 35 IU/L (ref 0–40)
Albumin: 4.4 g/dL (ref 3.8–4.9)
Alkaline Phosphatase: 64 IU/L (ref 44–121)
BUN/Creatinine Ratio: 11 (ref 9–20)
BUN: 14 mg/dL (ref 6–24)
Bilirubin Total: 0.5 mg/dL (ref 0.0–1.2)
CO2: 21 mmol/L (ref 20–29)
Calcium: 9.6 mg/dL (ref 8.7–10.2)
Chloride: 104 mmol/L (ref 96–106)
Creatinine, Ser: 1.3 mg/dL — ABNORMAL HIGH (ref 0.76–1.27)
Globulin, Total: 2.7 g/dL (ref 1.5–4.5)
Glucose: 111 mg/dL — ABNORMAL HIGH (ref 70–99)
Potassium: 4.3 mmol/L (ref 3.5–5.2)
Sodium: 143 mmol/L (ref 134–144)
Total Protein: 7.1 g/dL (ref 6.0–8.5)
eGFR: 64 mL/min/1.73 (ref >59)

## 2023-09-28 LAB — VALPROIC ACID LEVEL: Valproic Acid Lvl: 176 ug/mL (ref 50–100)

## 2023-09-30 ENCOUNTER — Ambulatory Visit: Admitting: Internal Medicine

## 2023-10-01 ENCOUNTER — Telehealth: Payer: Self-pay | Admitting: Neurology

## 2023-10-01 NOTE — Telephone Encounter (Signed)
 I called the patient, recent Depakote  level was quite high at 176.  He had not taken his morning medication, had taken his Depakote  12 hours prior at night.  Unclear what would explain the high swings in medication.  Currently taking Depakote  ER 500 mg 3 tablets twice a day.  Reviewed with Dr. Onita, will reduce 2 tablets AM/3 tablets PM.  He does not have significant signs of toxicity, but upon probing does report tremor, gait instability at times.  Other than metoprolol  no new medications. He is also on Dilantin  200 mg bedtime. Recheck level in 2 weeks. Recent kidney function was high creatinine 1.30 will forward over to PCP. Drink plenty of water.

## 2023-10-02 NOTE — Telephone Encounter (Signed)
 Faxed to pcp

## 2023-10-15 ENCOUNTER — Telehealth: Payer: Self-pay | Admitting: Neurology

## 2023-10-15 DIAGNOSIS — G40909 Epilepsy, unspecified, not intractable, without status epilepticus: Secondary | ICD-10-CM

## 2023-10-15 NOTE — Telephone Encounter (Signed)
 Pt called back  ,Inform  PT he will get call back

## 2023-10-15 NOTE — Telephone Encounter (Signed)
 Lvm 1st attempt by hf 10/15/23

## 2023-10-15 NOTE — Telephone Encounter (Signed)
 Please ask him to come either this week or next to recheck Depakote  level on lower dose. Come same time as prior lab draws for comparison. Thanks  Orders Placed This Encounter  Procedures   Valproic Acid  Level   CMP

## 2023-10-25 ENCOUNTER — Ambulatory Visit: Admitting: Nurse Practitioner

## 2023-10-31 ENCOUNTER — Telehealth: Payer: Self-pay | Admitting: Neurology

## 2023-10-31 NOTE — Telephone Encounter (Signed)
 Pt is asking to be called with results to lab work

## 2023-11-04 ENCOUNTER — Telehealth: Payer: Self-pay | Admitting: Internal Medicine

## 2023-11-04 NOTE — Telephone Encounter (Signed)
 Pt is having a dental cleaning 11/07/23. He would like to know if he needs to start an antibiotic before his cleaning. Please advise.

## 2023-11-05 ENCOUNTER — Telehealth: Payer: Self-pay | Admitting: Internal Medicine

## 2023-11-05 MED ORDER — AMOXICILLIN 500 MG PO TABS: 2000.0000 mg | ORAL_TABLET | ORAL | 1 refills | Status: AC | PRN

## 2023-11-05 NOTE — Telephone Encounter (Signed)
 Per Dr. Mallipeddi:  Yes he needs an antibiotic.   Amoxicillin 2g 30-60 minutes prior to the dental procedure/cleaning.  Sent in to pharmacy requested. Patient verbalized understanding

## 2023-11-05 NOTE — Telephone Encounter (Signed)
  Patient is going to the dentist on Thursday and he is asking if he should have an antibiotic for the visit. Please advise

## 2023-11-11 NOTE — Telephone Encounter (Signed)
 Pt is asking to be called with results to lab work from 9-12

## 2023-11-22 ENCOUNTER — Ambulatory Visit: Attending: Nurse Practitioner | Admitting: Nurse Practitioner

## 2023-11-22 ENCOUNTER — Encounter: Payer: Self-pay | Admitting: Nurse Practitioner

## 2023-11-22 VITALS — BP 136/82 | HR 60 | Ht 70.0 in | Wt 235.6 lb

## 2023-11-22 DIAGNOSIS — E785 Hyperlipidemia, unspecified: Secondary | ICD-10-CM | POA: Diagnosis not present

## 2023-11-22 DIAGNOSIS — R7989 Other specified abnormal findings of blood chemistry: Secondary | ICD-10-CM

## 2023-11-22 DIAGNOSIS — Z9889 Other specified postprocedural states: Secondary | ICD-10-CM

## 2023-11-22 DIAGNOSIS — I4891 Unspecified atrial fibrillation: Secondary | ICD-10-CM | POA: Diagnosis not present

## 2023-11-22 DIAGNOSIS — R251 Tremor, unspecified: Secondary | ICD-10-CM

## 2023-11-22 DIAGNOSIS — Z952 Presence of prosthetic heart valve: Secondary | ICD-10-CM

## 2023-11-22 NOTE — Progress Notes (Unsigned)
 Cardiology Office Note:  .   Date: 07/26/2023 ID:  Jerry [name], DOB 1965-05-05, MRN [id number] PCP: Trudy Vaughn FALCON, MD  [facility] Providers Cardiologist:  Diannah SHAUNNA [place], MD    History of Present Illness: Jerry [name] is a 58 y.o. male with a PMH of bicuspid aortic valve and severe aortic valve stenosis, s/p AVR (02/2023), history of seizures, vasculitis, history of subdural hematoma, s/p TBI in 1994,  pericardial effusion, s/p pericardial window (02/2023)-, hyperlipidemia, who presents today for scheduled follow-up.  2D echocardiogram in May 2023 revealed normal EF, severe aortic valve stenosis with mean gradient 38.2 mmHg.  Updated echo revealed normal EF, mild LVH, grade 1 DD, severe aortic valve stenosis mean gradient 32 mmHg.  Evaluated by Dr. Wonda in August 2024, recommended to undergo further restratification.  ETT was positive EKG for ischemia.  Cardiac CTA revealed mild obstructive disease.  Last seen by Jackee Alberts, NP on December 07, 2022.  He was overall doing well at that time.  Right and left heart cath was discussed with him in the office.  Underwent right and left heart cath in November 2024 that revealed severe bicuspid aortic valve stenosis with widely patent coronary arteries, mild nonobstructive plaquing along RCA.  Normal right heart hemodynamics-see full report below.  Underwent median sternotomy with AVR by Dr. Sherrine on February 07, 2023.  Postoperatively developed some anemia due to blood loss and hypoxia.  Amiodarone  was added for intraoperative A-fib, noted to have recurrent episodes of a flutter versus SVT.  Dr. Inocencio was consulted.  Recommended IV amiodarone .  Continue to have tachycardia with burst of PAF.  Repeated echo revealed large pericardial effusion, was taken to the OR for pericardial window with placement of left pleural chest tube.  EP recommended DOAC once cleared by surgery and continue amiodarone  for 3 weeks postop.  Due to  decreased functional mobility, was admitted for comprehensive rehab program.  04/26/2023 - Today he presents for s/p AVR follow-up.  He states he is recovering well.  Says he had some flulike symptoms October 26, that Sunday started feeling much better.  He says he is unsure what this is due to.  Does admit to some tremors that are more noticeable, says he thinks he had some tremors before surgery. Noticed along his hands. Denies any chest pain, shortness of breath, palpitations, syncope, presyncope, dizziness, orthopnea, PND, swelling or significant weight changes, acute bleeding, or claudication.  07/26/2023 - Here for follow-up.  Doing well and denies any issues since last office visit.  Continues to note mild tremors, this has been going on since before surgery. Denies any chest pain, shortness of breath, palpitations, syncope, presyncope, dizziness, orthopnea, PND, swelling or significant weight changes, acute bleeding, or claudication.  ROS: Negative. See HPI.   Works for [employer].   Studies Reviewed: SABRA    EKG: EKG is not ordered today.  Echo 04/2023:   1. Left ventricular ejection fraction, by estimation, is 55 to 60%. The  left ventricle has normal function. The left ventricle has no regional  wall motion abnormalities. Left ventricular diastolic parameters are  consistent with Grade I diastolic  dysfunction (impaired relaxation).   2. Right ventricular systolic function is normal. The right ventricular  size is normal.   3. The mitral valve is normal in structure. No evidence of mitral valve  regurgitation. No evidence of mitral stenosis.   4. The aortic valve has been repaired/replaced. Aortic valve  regurgitation is not  visualized. No aortic stenosis is present. There is a  25 mm Edward Inspiris Resilia pericardial valve valve present in the  aortic position. Procedure Date: 02/08/2023. Echo  findings are consistent with normal structure and function of the aortic  valve prosthesis.  Aortic valve mean gradient measures 5.7 mmHg.   Comparison(s): Prior images reviewed side by side. Changes from prior  study are noted. Pericardial effusion is completely resolved now.  Carotid duplex 02/2023: Summary:  Right Carotid: Velocities in the right ICA are consistent with a 1-39%  stenosis.   Left Carotid: Velocities in the left ICA are consistent with a 1-39%  stenosis.   Vertebrals: Bilateral vertebral arteries demonstrate antegrade flow.  Subclavians: Left subclavian artery was stenotic. Normal flow hemodynamics were seen in the right subclavian artery.    Right/left heart cath 12/2022:  1.  Known severe bicuspid aortic valve stenosis with bulky aortic valve calcification visualized on plain fluoroscopy 2.  Widely patent coronary arteries with normal left main, LAD, intermediate branch, and left circumflex.  Mild nonobstructive plaquing of a large, dominant RCA 3.  Normal right heart hemodynamics with a pulmonary wedge pressure of 13 mmHg, mean PA pressure 22 mmHg, right atrial pressure 9 mmHg.   Recommendations: Overnight observation due to social barriers, cardiac surgical consultation for aortic valve replacement  Physical Exam:   VS:  There were no vitals taken for this visit.   Wt Readings from Last 3 Encounters:  08/07/23 227 lb (103 kg)  07/26/23 230 lb (104.3 kg)  04/26/23 216 lb 3.2 oz (98.1 kg)    GEN: Well nourished, well developed in no acute distress NECK: No JVD; No carotid bruits CARDIAC: S1/S2, RRR, no murmurs, rubs, gallops RESPIRATORY:  Clear to auscultation without rales, wheezing or rhonchi  ABDOMEN: Soft, non-tender, non-distended EXTREMITIES:  No edema; No deformity, postural tremors noted to bilateral arms  ASSESSMENT AND PLAN: .    Severe aortic valve stenosis d/t bicuspid aortic valve, s/p AVR  Doing well post procedure. TTE 04/2023 revealed normal LVEF with normal function of aortic bioprosthesis. SBE prophylaxis discussed. No medication  changes at this time besides what is noted below. Heart healthy diet and regular cardiovascular exercise encouraged.   2. Atrial Fibrillation, tremors Noted to have some intraoperative A-fib and had recurrent episodes of A-flutter versus SVT.  Dr. Inocencio was consulted.  Recommended IV amiodarone .  Continued to have tachycardia with burst of PAF. Denies any tachycardia or palpitations recently. Stable tremors since stopping amiodarone  - will be speaking with provider about this.   3. Pericardial effusion, s/p pericardial window Post AVR he developed large pericardial effusion noted on repeat Echo, was taken to the OR for pericardial window with placement of left pleural chest tube. TTE 04/2023 revealed complete resolution of pericardial effusion. Denies any symptoms. Care and ED precautions discussed. EP's note from hospitalization recommended to start OAC once CTS felt it was appropriate, and stated could potentially stop amiodarone  at 3 month mark post operative AF. He has stopped Amiodarone . No indication about OAC from last progress note by CTS. Will consult attending cardiologist.   4. HLD LDL 39 several months ago. Continue rosuvastatin . Heart healthy diet and regular cardiovascular exercise encouraged.    Dispo: Follow-up with MD /APP in 6 months or sooner if anything changes.   Signed, Almarie Crate, NP

## 2023-11-22 NOTE — Patient Instructions (Addendum)
 Medication Instructions:  Your physician recommends that you continue on your current medications as directed. Please refer to the Current Medication list given to you today.  Labwork: At upcoming visit with your Primary Care Provider please have the fax us  Lab reports from 10/18/2023 we are specifically looking for a CMET if this was not done then. Please have your PCP draw this for you.  And fax us  the results to [phone]  Testing/Procedures: None   Follow-Up: Your physician recommends that you schedule a follow-up appointment in: 6 Months   Any Other Special Instructions Will Be Listed Below (If Applicable).  If you need a refill on your cardiac medications before your next appointment, please call your pharmacy.

## 2023-12-02 ENCOUNTER — Telehealth: Payer: Self-pay | Admitting: Nurse Practitioner

## 2023-12-02 ENCOUNTER — Telehealth: Payer: Self-pay | Admitting: Neurology

## 2023-12-02 NOTE — Telephone Encounter (Signed)
 See 10/31/23 telephone encounter labs requested to be faxed here.

## 2023-12-02 NOTE — Telephone Encounter (Signed)
 Jerry [name], it appears we requested lab result on the below encounter. I am sure if we ever got them can you see if they can fax to pod 1 fax [phone]

## 2023-12-02 NOTE — Telephone Encounter (Signed)
 Pt called to see if we received lab results from his PCP. Please Advise

## 2023-12-02 NOTE — Telephone Encounter (Signed)
 Pt is asking to be called with result to lab work from Sept of this year

## 2023-12-02 NOTE — Telephone Encounter (Signed)
 [email or web address] has asked to be connected to Medical Records

## 2023-12-03 ENCOUNTER — Encounter: Payer: Self-pay | Admitting: Nurse Practitioner

## 2023-12-03 ENCOUNTER — Encounter: Payer: Self-pay | Admitting: *Deleted

## 2023-12-03 NOTE — Telephone Encounter (Addendum)
 Sent request to PCP for recent lab work.

## 2023-12-03 NOTE — Telephone Encounter (Signed)
 I reviewed labs 11/29/23 Depakote  level 110, Dilantin  1.6.  Back in August his level was very high at 176, Depakote  was reduced Depakote  ER 500 mg, 2 tablets AM/3 tablets PM. I called the patient. He is doing well, no issues with lower dose. No seizure activity.

## 2023-12-03 NOTE — Telephone Encounter (Signed)
 Patient informed.

## 2023-12-03 NOTE — Telephone Encounter (Signed)
 Patient asking for a call back to go over results.

## 2023-12-03 NOTE — Telephone Encounter (Signed)
 Noted

## 2023-12-27 ENCOUNTER — Other Ambulatory Visit: Payer: Self-pay | Admitting: Cardiovascular Disease

## 2024-02-03 ENCOUNTER — Ambulatory Visit: Attending: Nurse Practitioner | Admitting: Nurse Practitioner

## 2024-02-03 ENCOUNTER — Encounter: Payer: Self-pay | Admitting: Nurse Practitioner

## 2024-02-03 VITALS — BP 120/78 | HR 60 | Ht 70.0 in | Wt 236.0 lb

## 2024-02-03 DIAGNOSIS — E785 Hyperlipidemia, unspecified: Secondary | ICD-10-CM | POA: Diagnosis not present

## 2024-02-03 DIAGNOSIS — Z9889 Other specified postprocedural states: Secondary | ICD-10-CM | POA: Diagnosis not present

## 2024-02-03 DIAGNOSIS — Z952 Presence of prosthetic heart valve: Secondary | ICD-10-CM

## 2024-02-03 DIAGNOSIS — I4891 Unspecified atrial fibrillation: Secondary | ICD-10-CM

## 2024-02-03 MED ORDER — METOPROLOL TARTRATE 25 MG PO TABS: 12.5000 mg | ORAL_TABLET | Freq: Two times a day (BID) | ORAL | 3 refills | Status: AC

## 2024-02-03 NOTE — Patient Instructions (Signed)
 Medication Instructions:  Your physician recommends that you continue on your current medications as directed. Please refer to the Current Medication list given to you today.   Labwork: None  Testing/Procedures: none  Follow-Up: Your physician recommends that you schedule a follow-up appointment in: 6 months  Any Other Special Instructions Will Be Listed Below (If Applicable). Thank you for choosing [facility]!     If you need a refill on your cardiac medications before your next appointment, please call your pharmacy.

## 2024-02-03 NOTE — Progress Notes (Unsigned)
 " Cardiology Office Note:  .   Date: 02/03/2024 ID:  Jerry [name], DOB April 08, 1965, MRN [id number] PCP: Trudy Vaughn FALCON, MD  [facility] Providers Cardiologist:  Diannah SHAUNNA Maywood, MD    History of Present Illness: Jerry [name] is a 58 y.o. male with a PMH of bicuspid aortic valve and severe aortic valve stenosis, s/p AVR (02/2023), history of seizures, vasculitis, history of subdural hematoma, s/p TBI in 1994,  pericardial effusion, s/p pericardial window (02/2023)-, hyperlipidemia, who presents today for scheduled follow-up.  2D echocardiogram in May 2023 revealed normal EF, severe aortic valve stenosis with mean gradient 38.2 mmHg.  Updated echo revealed normal EF, mild LVH, grade 1 DD, severe aortic valve stenosis mean gradient 32 mmHg.  Evaluated by Dr. Wonda in August 2024, recommended to undergo further restratification.  ETT was positive EKG for ischemia.  Cardiac CTA revealed mild obstructive disease.  Last seen by Jackee Alberts, NP on December 07, 2022.  He was overall doing well at that time.  Right and left heart cath was discussed with him in the office.  Underwent right and left heart cath in November 2024 that revealed severe bicuspid aortic valve stenosis with widely patent coronary arteries, mild nonobstructive plaquing along RCA.  Normal right heart hemodynamics-see full report below.  Underwent median sternotomy with AVR by Dr. Sherrine on February 07, 2023.  Postoperatively developed some anemia due to blood loss and hypoxia.  Amiodarone  was added for intraoperative A-fib, noted to have recurrent episodes of a flutter versus SVT.  Dr. Inocencio was consulted.  Recommended IV amiodarone .  Continue to have tachycardia with burst of PAF.  Repeated echo revealed large pericardial effusion, was taken to the OR for pericardial window with placement of left pleural chest tube.  EP recommended DOAC once cleared by surgery and continue amiodarone  for 3 weeks postop.  Due to  decreased functional mobility, was admitted for comprehensive rehab program.  04/26/2023 - Today he presents for s/p AVR follow-up.  He states he is recovering well.  Says he had some flulike symptoms October 26, that Sunday started feeling much better.  He says he is unsure what this is due to.  Does admit to some tremors that are more noticeable, says he thinks he had some tremors before surgery. Noticed along his hands. Denies any chest pain, shortness of breath, palpitations, syncope, presyncope, dizziness, orthopnea, PND, swelling or significant weight changes, acute bleeding, or claudication.  07/26/2023 - Here for follow-up.  Doing well and denies any issues since last office visit.  Continues to note mild tremors, this has been going on since before surgery. Denies any chest pain, shortness of breath, palpitations, syncope, presyncope, dizziness, orthopnea, PND, swelling or significant weight changes, acute bleeding, or claudication.  11/22/2023 -doing better since I last saw him.  Per last telephone note stated he did not feel right due to neurosurgeon adjusting medication.  [name] had lab work done at [facility] on October 18, 2023 and will be seeing Dr. Trudy for upcoming appointment soon, tells me lab work has been followed recently due to abnormal valproic acid , says he will most likely be getting an ultrasound of his liver performed in the future. Denies any chest pain, shortness of breath, palpitations, syncope, presyncope, dizziness, orthopnea, PND, swelling or significant weight changes, acute bleeding, or claudication.  02/03/2024 - Doing well and denies any acute cardiac complaints or issues. Denies any chest pain, shortness of breath, palpitations, syncope, presyncope, dizziness,  orthopnea, PND, swelling or significant weight changes, acute bleeding, or claudication. Admits to weight gain and will be changing lifestyle habits soon.  ROS: Negative. See HPI.   Works for [employer].    Studies Reviewed: SABRA    EKG: EKG is not ordered today.      Echo 04/2023:   1. Left ventricular ejection fraction, by estimation, is 55 to 60%. The  left ventricle has normal function. The left ventricle has no regional  wall motion abnormalities. Left ventricular diastolic parameters are  consistent with Grade I diastolic  dysfunction (impaired relaxation).   2. Right ventricular systolic function is normal. The right ventricular  size is normal.   3. The mitral valve is normal in structure. No evidence of mitral valve  regurgitation. No evidence of mitral stenosis.   4. The aortic valve has been repaired/replaced. Aortic valve  regurgitation is not visualized. No aortic stenosis is present. There is a  25 mm Edward Inspiris Resilia pericardial valve valve present in the  aortic position. Procedure Date: 02/08/2023. Echo  findings are consistent with normal structure and function of the aortic  valve prosthesis. Aortic valve mean gradient measures 5.7 mmHg.   Comparison(s): Prior images reviewed side by side. Changes from prior  study are noted. Pericardial effusion is completely resolved now.  Carotid duplex 02/2023: Summary:  Right Carotid: Velocities in the right ICA are consistent with a 1-39%  stenosis.   Left Carotid: Velocities in the left ICA are consistent with a 1-39%  stenosis.   Vertebrals: Bilateral vertebral arteries demonstrate antegrade flow.  Subclavians: Left subclavian artery was stenotic. Normal flow hemodynamics were seen in the right subclavian artery.    Right/left heart cath 12/2022:  1.  Known severe bicuspid aortic valve stenosis with bulky aortic valve calcification visualized on plain fluoroscopy 2.  Widely patent coronary arteries with normal left main, LAD, intermediate branch, and left circumflex.  Mild nonobstructive plaquing of a large, dominant RCA 3.  Normal right heart hemodynamics with a pulmonary wedge pressure of 13 mmHg, mean PA pressure  22 mmHg, right atrial pressure 9 mmHg.   Recommendations: Overnight observation due to social barriers, cardiac surgical consultation for aortic valve replacement  Physical Exam:   VS:  BP 120/78 (BP Location: Right Arm, Cuff Size: Normal)   Pulse 60   Ht 5' 10 (1.778 m)   Wt 236 lb (107 kg)   SpO2 97%   BMI 33.86 kg/m    Wt Readings from Last 3 Encounters:  02/03/24 236 lb (107 kg)  11/22/23 235 lb 9.6 oz (106.9 kg)  08/07/23 227 lb (103 kg)    GEN: Well nourished, well developed in no acute distress NECK: No JVD; No carotid bruits CARDIAC: S1/S2, RRR, no murmurs, rubs, gallops RESPIRATORY:  Clear to auscultation without rales, wheezing or rhonchi  ABDOMEN: Soft, non-tender, non-distended EXTREMITIES:  No edema; No deformity ASSESSMENT AND PLAN: .    Severe aortic valve stenosis d/t bicuspid aortic valve, s/p AVR   TTE 04/2023 revealed normal LVEF with normal function of aortic bioprosthesis. SBE prophylaxis discussed. No medication changes at this time. Heart healthy diet and regular cardiovascular exercise encouraged.   2. Atrial Fibrillation  Denies any tachycardia or palpitations recently.  CHA2DS2-VASc score is 0.  No medication changes at this time.  Heart healthy diet and regular cardiovascular exercise encouraged.   3. Pericardial effusion, s/p pericardial window  Post AVR he developed large pericardial effusion noted on repeat Echo, was taken to the OR  for pericardial window with placement of left pleural chest tube. TTE 04/2023 revealed complete resolution of pericardial effusion. Denies any symptoms. Care and ED precautions discussed. EP's note from hospitalization recommended to start OAC once CTS felt it was appropriate, and stated could potentially stop amiodarone  at 3 month mark post operative AF. He has stopped Amiodarone . No indication about OAC from last progress note by CTS.   4. HLD LDL 39 several months ago. Continue rosuvastatin . Heart healthy diet and regular  cardiovascular exercise encouraged.     Dispo: Will provide refill per his request. Follow-up with MD /APP in 6 months or sooner if anything changes.   Signed, Almarie Crate, NP   "

## 2024-03-12 ENCOUNTER — Encounter: Payer: Self-pay | Admitting: Internal Medicine

## 2024-03-12 ENCOUNTER — Telehealth: Payer: Self-pay | Admitting: Neurology

## 2024-03-12 NOTE — Telephone Encounter (Signed)
 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 He is welcome to get the paperwork, but I think it will be very difficult to obtain with history and medication.

## 2024-03-12 NOTE — Telephone Encounter (Signed)
 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 He doesn't want to waste his time going to do it to be shut down. He has pcp appt tomorrow I asked him to inquire about DOT physical while there. He stated he is concerned that pcp may require neuro sign off and if so would sarah approve.

## 2024-03-12 NOTE — Telephone Encounter (Signed)
 Error

## 2024-03-12 NOTE — Telephone Encounter (Signed)
 Called and relayed information to pt. Pt voiced understanding

## 2024-03-12 NOTE — Telephone Encounter (Signed)
 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

## 2024-07-24 ENCOUNTER — Ambulatory Visit: Admitting: Nurse Practitioner

## 2024-08-20 ENCOUNTER — Ambulatory Visit: Admitting: Neurology
